# Patient Record
Sex: Female | Born: 1955
Health system: Southern US, Community
[De-identification: ages and names within clinical notes are randomized; demographics above are authoritative.]

## PROBLEM LIST (undated history)

## (undated) DIAGNOSIS — B179 Acute viral hepatitis, unspecified: Secondary | ICD-10-CM

## (undated) DIAGNOSIS — F1490 Cocaine use, unspecified, uncomplicated: Secondary | ICD-10-CM

## (undated) DIAGNOSIS — F1010 Alcohol abuse, uncomplicated: Secondary | ICD-10-CM

## (undated) DIAGNOSIS — K226 Gastro-esophageal laceration-hemorrhage syndrome: Secondary | ICD-10-CM

## (undated) DIAGNOSIS — K7460 Unspecified cirrhosis of liver: Secondary | ICD-10-CM

## (undated) DIAGNOSIS — R569 Unspecified convulsions: Secondary | ICD-10-CM

## (undated) DIAGNOSIS — K2980 Duodenitis without bleeding: Secondary | ICD-10-CM

## (undated) DIAGNOSIS — I10 Essential (primary) hypertension: Secondary | ICD-10-CM

## (undated) DIAGNOSIS — E7800 Pure hypercholesterolemia, unspecified: Secondary | ICD-10-CM

## (undated) HISTORY — DX: Unspecified cirrhosis of liver: K74.60

## (undated) HISTORY — DX: Duodenitis without bleeding: K29.80

## (undated) HISTORY — DX: Gastro-esophageal laceration-hemorrhage syndrome: K22.6

## (undated) HISTORY — DX: Acute viral hepatitis, unspecified: B17.9

---

## 2000-05-29 ENCOUNTER — Emergency Department (HOSPITAL_COMMUNITY): Admission: EM | Admit: 2000-05-29 | Discharge: 2000-05-29 | Payer: Self-pay | Admitting: Emergency Medicine

## 2000-05-29 ENCOUNTER — Encounter: Payer: Self-pay | Admitting: Emergency Medicine

## 2003-01-29 ENCOUNTER — Encounter: Payer: Self-pay | Admitting: Family Medicine

## 2003-01-29 ENCOUNTER — Encounter: Admission: RE | Admit: 2003-01-29 | Discharge: 2003-01-29 | Payer: Self-pay | Admitting: Family Medicine

## 2004-04-06 ENCOUNTER — Ambulatory Visit: Payer: Self-pay | Admitting: Internal Medicine

## 2004-04-06 ENCOUNTER — Inpatient Hospital Stay (HOSPITAL_COMMUNITY): Admission: EM | Admit: 2004-04-06 | Discharge: 2004-04-08 | Payer: Self-pay | Admitting: Emergency Medicine

## 2004-08-14 ENCOUNTER — Emergency Department (HOSPITAL_COMMUNITY): Admission: EM | Admit: 2004-08-14 | Discharge: 2004-08-14 | Payer: Self-pay | Admitting: Family Medicine

## 2005-07-11 ENCOUNTER — Encounter: Admission: RE | Admit: 2005-07-11 | Discharge: 2005-07-11 | Payer: Self-pay | Admitting: Family Medicine

## 2010-01-11 ENCOUNTER — Emergency Department (HOSPITAL_COMMUNITY): Admission: EM | Admit: 2010-01-11 | Discharge: 2010-01-11 | Payer: Self-pay | Admitting: Emergency Medicine

## 2010-10-06 NOTE — Op Note (Signed)
NAME:  Christie Morrison, Christie Morrison              ACCOUNT NO.:  [id number]   MEDICAL RECORD NO.:  [id number]          PATIENT TYPE:  INP   LOCATION:  2023                         FACILITY:  [facility]   PHYSICIAN:  Lina Sar, M.D. [facility]  DATE OF BIRTH:  [birth year]   DATE OF PROCEDURE:  DATE OF DISCHARGE:                                 OPERATIVE REPORT   NAME OF PROCEDURE:  Upper endoscopy.   INDICATIONS:  This 55 year old African-American female was admitted with  intractable nausea, vomiting, and hematemesis.  The hemoglobin dropped from  11 g to 9.2 g.  She has a history of alcohol abuse as well as drug abuse.  Her coagulation studies were normal.  After stabilization, she is undergoing  upper endoscopy to assess for the possibility of a Mallory-Weiss tear,  erosive gastritis, or peptic ulcer disease.   ENDOSCOPE:  Fujinon single-channel videoendoscope.   SEDATION:  Versed 5 mg IV, fentanyl 50 mcg IV.   FINDINGS:  Fujinon single-channel videoscope passed under observation  through the posterior pharynx into the esophagus.  The patient was monitored  by pulse oximeter.  Oxygen saturation was normal.  She was cooperative.  Proximal and mid esophageal mucosa was unremarkable with some mild,  nonspecific erythema.  At the G junction on the gastric side was a healing  Mallory-Weiss tear which was a linear ulceration covered with exudate and a  visible vessel which was clotted and did not bleed activity.  There was just  an old organized blood clot covering the Mallory-Weiss tear.  There was no  stricture or hiatal hernia.   Stomach:  Stomach was insufflated with air and appeared to be free of any  blood. Gastric folds were normal.  Gastric antrum was also normal.  Biopsies  were taken for CLOtest.   Duodenum:  Duodenal bulb showed patches of erythema extending into the  descending duodenum.  These were consistent with mild duodenitis.  There  were no erosions and no evidence of bleeding.  There were  no varices.  The  endoscope was then brought back through the stomach, retroflexed, and the  fundus and cardia were visualized but no gastric varices were noted.   IMPRESSION:  1.  Mallory-Weiss tear, healing.  2.  No evidence of esophageal or gastric varices.  3.  Mild duodenitis status post CLOtest.   PLAN:  Will advance the patient's diet to full liquids, recheck her H&H,  switch her from IV Protonix to p.o. Protonix, and she will be able to be  discharged within the next 24 hours.  I also talked to the patient about  abstaining from alcohol and drugs.      Dora   DB/MEDQ  D:  04/08/2004  T:  04/08/2004  Job:  [id number]

## 2010-10-06 NOTE — H&P (Signed)
NAME:  Christie Morrison, Christie Morrison              ACCOUNT NO.:  [id number]   MEDICAL RECORD NO.:  [id number]          PATIENT TYPE:  INP   LOCATION:  1824                         FACILITY:  [facility]   PHYSICIAN:  Michaelyn Barter, M.D. DATE OF BIRTH:  24-Jul-1955   DATE OF ADMISSION:  04/06/2004  DATE OF DISCHARGE:                                HISTORY & PHYSICAL   CHIEF COMPLAINT:  Vomiting blood.   HISTORY OF PRESENT ILLNESS:  The patient is a 55 year old female with a  history of hyperlipidemia who states that Tuesday of this week during the  morning while walking home from [facility] she began to vomit up blood.  She said that on Wednesday she drank a beer around 12 o'clock noon and had  an episode of emesis following the consumption of this beer.  Shortly  thereafter she had an episode of emesis which consisted only of blood.  She  states that over the course of Tuesday and Wednesday she may have vomited up  approximately one cup of blood.  Today she drank water and had an episode of  emesis, however, no blood was present today.  She went to see her primary  care physician, Dr. Blunt around 2 p.m.  At that time, she informed him of  her hematemesis and also complained of darkened stools since Tuesday.  He  sent her to the emergency room for further evaluation.  She denies having  any current abdominal pain, no chest pain, no shortness of breath.  She does  not take aspirin, Motrin, or other NSAID's.  She does not drink caffeine  either.  The patient has never had a GI bleed before.  She does consume  alcohol everyday.   PAST MEDICAL HISTORY:  Hypercholesterolemia.   PAST SURGICAL HISTORY:  C-section.   ALLERGIES:  No known drug allergies.   MEDICATIONS:  Cholesterol medication, however, the patient cannot remember  the name of the medication.   FAMILY HISTORY:  Mother:  Diabetes, gout, hypertension, arthritis.  Father:  Borderline diabetes.   SOCIAL HISTORY:  Cigarettes:  Never.   Alcohol:  The patient drinks one 40  ounce every day and has been doing so for at least 25 years.  She also  consumes approximately a fifth of Vodka on the weekends.  Street drugs:  Positive crack-cocaine usage.  The last use was one year ago.  She has used  crack-cocaine for 10 years prior to one year ago.  Marijuana was positive  also.  The last used was in the 1980s.   REVIEW OF SYSTEMS:  As per HPI, otherwise all other systems are negative.   PHYSICAL EXAMINATION:  GENERAL:  No obvious distress.  VITALS:  Blood pressure 131/94, heart rate 97, respirations 18, temperature  99.  HEENT:  Anicteric.  Extraocular movements are intact.  NECK:  Supple.  No lymphadenopathy.  Thyroid not palpable.  CARDIAC:  S1, S2 present.  Regular rate and rhythm.  No S3.  No S4.  RESPIRATORY:  Clear bilaterally.  No crackles, no wheezes.  ABDOMEN:  Soft.  There was some slight discomfort in the  lower midabdominal  region.  There was no rebound.  There was no guarding.  Bowel sounds are  present.  There is no palpable organomegaly.  EXTREMITIES:  No leg edema.  MUSCULOSKELETAL:  5/5 upper and lower extremity strength.  NEUROLOGICAL:  The patient is alert and oriented x3.   LABORATORY DATA:  Hemoglobin 11.3, hematocrit 32.5.  BUN 12, creatinine 1.0.  Amylase 95, lipase 29.  Fecal occult blood is positive.  SGOT 115, SGPT 67.   ASSESSMENT AND PLAN:  1.  Upper gastrointestinal bleed, etiology is questionable.  However, given      the patient's long history of long-term alcohol consumption one has to      be concerned about the possibility of a bleeding esophageal varices      versus peptic ulcer disease with or without perforation, however,      perforations does not appear to be as likely given the absence of      significant pain and also the fact that the patient although she had an      episode of emesis today denies the presence of blood present.  Will,      however, order abdominal x-rays, flat and  upright.  Will monitor the      patient's hemoglobin and hematocrit very closely, provide aggressive IV      fluid, Protonix 40 mg IV daily, and will request a GI consultation for      further evaluation.  2.  Elevated liver enzymes.  The etiology again is questionable.  However,      this could be related to the effects of alcohol on the liver.  Will get      an ultrasound of the liver for further evaluation.  3.  History of alcohol abuse.  Will provide thiamine, folate, and      multivitamin supplementation.  Will also watch closely for any signs of      withdrawal.  Will check a serum alcohol level and a urine drug screen.      Orla   OR/MEDQ  D:  04/07/2004  T:  04/07/2004  Job:  [id number]

## 2010-10-06 NOTE — Discharge Summary (Signed)
NAME:  Christie Morrison, Christie Morrison              ACCOUNT NO.:  [id number]   MEDICAL RECORD NO.:  [id number]          PATIENT TYPE:  INP   LOCATION:  2023                         FACILITY:  [facility]   PHYSICIAN:  Isidor Holts, M.D.  DATE OF BIRTH:  07-06-1955   DATE OF ADMISSION:  04/06/2004  DATE OF DISCHARGE:  04/08/2004                                 DISCHARGE SUMMARY   PRIMARY CARE PHYSICIAN:  Doneen Poisson, M.D.   PRIMARY DIAGNOSES:  1.  Upper gastrointestinal bleed secondary to Mallory-Weiss tear.  2.  Duodenitis.   SECONDARY DIAGNOSES:  1.  Alcohol excess/abuse.  2.  Dyslipidemia.  3.  History of diverticulosis confirmed by colonoscopy April 04, 2004.   DISCHARGE MEDICATIONS:  Protonix 40 mg p.o. b.i.d. for one week only.   PROCEDURE:  1.  Abdominal x-ray dated April 07, 2004:  This showed nonspecific bowel      gas pattern without bowel obstruction or perforation.  2.  Abdominal ultrasound dated April 07, 2004:  This showed numerous      small gallstones in the gallbladder no sonographic evidence for acute      cholecystitis.  Normal common bile duct.  Limited visualization of the      pancreas and IVC.   HISTORY OF PRESENT ILLNESS:  As in notes of H&P dated April 06, 2004.  However, in brief, this is a 55 year old female who has a history of  dyslipidemia.  She presented with hematemesis as well as black stools  suspicious for melena.  She had no history of NSAIDs use and drinks alcohol,  about 40 ounces of beer a day for the past 25 years.  She also consumes  approximately 3/5th of vodka on the weekends.   She was hemodynamically stable at the time of presentation.  Hemoglobin was  11.3.  She was admitted for further evaluation, investigation, and  treatment.   HOSPITAL COURSE:  Problem 1:  UPPER GASTROINTESTINAL BLEED:  The patient was  admitted, kept NPO initially.  This was subsequently broadened to clear  fluids.  She was treated with intravenous fluids and  proton pump inhibitor  and had no further episodes of hematemesis, and/or melena.  Hemoglobin  remained stable.   She underwent upper GI endoscopy on April 08, 2004.  This showed a  healing Mallory-Weiss tear.  No evidence of peptic ulcer disease.  It was  positive for duodenitis.  CLO-test was done and results are still pending.   Problem 2:  ABNORMAL LIVER FUNCTION TESTS:  The patient was admitted with  SGOT of 115, SGPT of 67, i.e. consistent with alcohol abuse.  Amylase and  lipase levels are within normal limits.  Abdominal ultrasound scan showed  asymptomatic cholelithiasis but was otherwise normal.  She showed no  evidence of alcohol withdrawal phenomena during the course of her hospital  stay and as of April 08, 2004, her SGOT was down to 87, SGPT was 47.  Viral hepatitis screen was negative for hepatitis B surface antigen and  hepatitis C antibody.  She has been strongly cautioned to refrain from  further consumption of alcohol.  She had verbalized understanding.   Problem 3:  HISTORY OF ALCOHOL ABUSE:  See problem 2 above.   Problem 4:  HISTORY OF DYSLIPIDEMIA:  The patient is to follow up with her  primary care physician.   DISPOSITION:  The patient was deemed sufficiently stable to be discharged in  satisfactory condition on April 08, 2004 on a one-week course of  Protonix.  She has been recommended a low cholesterol diet and strongly  advised to avoid alcohol.  She will call her primary care physician to  arrange follow-up appointment within two weeks.  The patient's primary care  physician is Dr. Britt Bottom Blunt.  The patient verbalized understanding.      Chri   CO/MEDQ  D:  04/09/2004  T:  04/09/2004  Job:  [id number]   cc:   Doneen Poisson, M.D.

## 2016-06-08 ENCOUNTER — Emergency Department (HOSPITAL_COMMUNITY): Payer: Self-pay

## 2016-06-08 ENCOUNTER — Inpatient Hospital Stay (HOSPITAL_COMMUNITY): Payer: Self-pay

## 2016-06-08 ENCOUNTER — Encounter (HOSPITAL_COMMUNITY): Payer: Self-pay | Admitting: Emergency Medicine

## 2016-06-08 ENCOUNTER — Inpatient Hospital Stay (HOSPITAL_COMMUNITY)
Admission: EM | Admit: 2016-06-08 | Discharge: 2016-06-13 | DRG: 872 | Disposition: A | Discharge status: Home / Self Care | Payer: Self-pay | Attending: Internal Medicine | Admitting: Internal Medicine

## 2016-06-08 DIAGNOSIS — F10229 Alcohol dependence with intoxication, unspecified: Secondary | ICD-10-CM | POA: Diagnosis present

## 2016-06-08 DIAGNOSIS — R680 Hypothermia, not associated with low environmental temperature: Secondary | ICD-10-CM | POA: Diagnosis present

## 2016-06-08 DIAGNOSIS — E876 Hypokalemia: Secondary | ICD-10-CM | POA: Diagnosis present

## 2016-06-08 DIAGNOSIS — N179 Acute kidney failure, unspecified: Secondary | ICD-10-CM | POA: Diagnosis present

## 2016-06-08 DIAGNOSIS — B179 Acute viral hepatitis, unspecified: Secondary | ICD-10-CM

## 2016-06-08 DIAGNOSIS — K921 Melena: Secondary | ICD-10-CM | POA: Diagnosis present

## 2016-06-08 DIAGNOSIS — Z841 Family history of disorders of kidney and ureter: Secondary | ICD-10-CM

## 2016-06-08 DIAGNOSIS — Y908 Blood alcohol level of 240 mg/100 ml or more: Secondary | ICD-10-CM | POA: Diagnosis present

## 2016-06-08 DIAGNOSIS — E870 Hyperosmolality and hypernatremia: Secondary | ICD-10-CM | POA: Diagnosis present

## 2016-06-08 DIAGNOSIS — D6959 Other secondary thrombocytopenia: Secondary | ICD-10-CM | POA: Diagnosis present

## 2016-06-08 DIAGNOSIS — Z833 Family history of diabetes mellitus: Secondary | ICD-10-CM

## 2016-06-08 DIAGNOSIS — F1010 Alcohol abuse, uncomplicated: Secondary | ICD-10-CM | POA: Diagnosis present

## 2016-06-08 DIAGNOSIS — K7030 Alcoholic cirrhosis of liver without ascites: Secondary | ICD-10-CM | POA: Diagnosis present

## 2016-06-08 DIAGNOSIS — E860 Dehydration: Secondary | ICD-10-CM | POA: Diagnosis present

## 2016-06-08 DIAGNOSIS — Z811 Family history of alcohol abuse and dependence: Secondary | ICD-10-CM

## 2016-06-08 DIAGNOSIS — D696 Thrombocytopenia, unspecified: Secondary | ICD-10-CM | POA: Diagnosis present

## 2016-06-08 DIAGNOSIS — K2920 Alcoholic gastritis without bleeding: Secondary | ICD-10-CM | POA: Diagnosis present

## 2016-06-08 DIAGNOSIS — A419 Sepsis, unspecified organism: Principal | ICD-10-CM | POA: Diagnosis present

## 2016-06-08 DIAGNOSIS — R740 Nonspecific elevation of levels of transaminase and lactic acid dehydrogenase [LDH]: Secondary | ICD-10-CM

## 2016-06-08 DIAGNOSIS — F1410 Cocaine abuse, uncomplicated: Secondary | ICD-10-CM | POA: Diagnosis present

## 2016-06-08 DIAGNOSIS — I10 Essential (primary) hypertension: Secondary | ICD-10-CM | POA: Diagnosis present

## 2016-06-08 DIAGNOSIS — D638 Anemia in other chronic diseases classified elsewhere: Secondary | ICD-10-CM | POA: Diagnosis present

## 2016-06-08 DIAGNOSIS — I959 Hypotension, unspecified: Secondary | ICD-10-CM | POA: Diagnosis present

## 2016-06-08 DIAGNOSIS — F10929 Alcohol use, unspecified with intoxication, unspecified: Secondary | ICD-10-CM

## 2016-06-08 DIAGNOSIS — R7989 Other specified abnormal findings of blood chemistry: Secondary | ICD-10-CM | POA: Diagnosis present

## 2016-06-08 DIAGNOSIS — E7800 Pure hypercholesterolemia, unspecified: Secondary | ICD-10-CM | POA: Diagnosis present

## 2016-06-08 DIAGNOSIS — K7010 Alcoholic hepatitis without ascites: Secondary | ICD-10-CM | POA: Diagnosis present

## 2016-06-08 HISTORY — DX: Pure hypercholesterolemia, unspecified: E78.00

## 2016-06-08 HISTORY — DX: Cocaine use, unspecified, uncomplicated: F14.90

## 2016-06-08 HISTORY — DX: Alcohol abuse, uncomplicated: F10.10

## 2016-06-08 LAB — CBC WITH DIFFERENTIAL/PLATELET
BASOS PCT: 0 %
Basophils Absolute: 0 10*3/uL (ref 0.0–0.1)
EOS ABS: 0 10*3/uL (ref 0.0–0.7)
Eosinophils Relative: 1 %
HEMATOCRIT: 41.2 % (ref 36.0–46.0)
HEMOGLOBIN: 13.6 g/dL (ref 12.0–15.0)
LYMPHS ABS: 2.4 10*3/uL (ref 0.7–4.0)
Lymphocytes Relative: 46 %
MCH: 32.6 pg (ref 26.0–34.0)
MCHC: 33 g/dL (ref 30.0–36.0)
MCV: 98.8 fL (ref 78.0–100.0)
MONO ABS: 0.1 10*3/uL (ref 0.1–1.0)
MONOS PCT: 2 %
NEUTROS ABS: 2.6 10*3/uL (ref 1.7–7.7)
NEUTROS PCT: 51 %
Platelets: 146 10*3/uL — ABNORMAL LOW (ref 150–400)
RBC: 4.17 MIL/uL (ref 3.87–5.11)
RDW: 14 % (ref 11.5–15.5)
WBC: 5.1 10*3/uL (ref 4.0–10.5)

## 2016-06-08 LAB — COMPREHENSIVE METABOLIC PANEL
ALT: 184 U/L — AB (ref 14–54)
ALT: 262 U/L — ABNORMAL HIGH (ref 14–54)
ANION GAP: 20 — AB (ref 5–15)
AST: 658 U/L — ABNORMAL HIGH (ref 15–41)
AST: 1275 U/L — AB (ref 15–41)
Albumin: 4 g/dL (ref 3.5–5.0)
Albumin: 3.3 g/dL — ABNORMAL LOW (ref 3.5–5.0)
Alkaline Phosphatase: 85 U/L (ref 38–126)
Alkaline Phosphatase: 88 U/L (ref 38–126)
Anion gap: 14 (ref 5–15)
BUN: 5 mg/dL — ABNORMAL LOW (ref 6–20)
BUN: <5 mg/dL — ABNORMAL LOW (ref 6–20)
CHLORIDE: 105 mmol/L (ref 101–111)
CHLORIDE: 114 mmol/L — AB (ref 101–111)
CO2: 21 mmol/L — AB (ref 22–32)
CO2: 17 mmol/L — AB (ref 22–32)
Calcium: 9.2 mg/dL (ref 8.9–10.3)
Calcium: 7.5 mg/dL — ABNORMAL LOW (ref 8.9–10.3)
Creatinine, Ser: 0.96 mg/dL (ref 0.44–1.00)
Creatinine, Ser: 1.18 mg/dL — ABNORMAL HIGH (ref 0.44–1.00)
GFR, EST AFRICAN AMERICAN: 57 mL/min — AB (ref >60)
GFR, EST NON AFRICAN AMERICAN: 49 mL/min — AB (ref >60)
Glucose, Bld: 128 mg/dL — ABNORMAL HIGH (ref 65–99)
Glucose, Bld: 118 mg/dL — ABNORMAL HIGH (ref 65–99)
POTASSIUM: 3.7 mmol/L (ref 3.5–5.1)
POTASSIUM: 3.7 mmol/L (ref 3.5–5.1)
SODIUM: 146 mmol/L — AB (ref 135–145)
SODIUM: 145 mmol/L (ref 135–145)
Total Bilirubin: 0.6 mg/dL (ref 0.3–1.2)
Total Bilirubin: 0.8 mg/dL (ref 0.3–1.2)
Total Protein: 7.3 g/dL (ref 6.5–8.1)
Total Protein: 6.1 g/dL — ABNORMAL LOW (ref 6.5–8.1)

## 2016-06-08 LAB — URINALYSIS, ROUTINE W REFLEX MICROSCOPIC
Bilirubin Urine: NEGATIVE
Glucose, UA: 50 mg/dL — AB
KETONES UR: NEGATIVE mg/dL
LEUKOCYTES UA: NEGATIVE
NITRITE: NEGATIVE
PH: 7 (ref 5.0–8.0)
PROTEIN: 100 mg/dL — AB
Specific Gravity, Urine: 1.008 (ref 1.005–1.030)

## 2016-06-08 LAB — I-STAT CG4 LACTIC ACID, ED
LACTIC ACID, VENOUS: 7.63 mmol/L — AB (ref 0.5–1.9)
Lactic Acid, Venous: 3.21 mmol/L (ref 0.5–1.9)

## 2016-06-08 LAB — POC OCCULT BLOOD, ED: Fecal Occult Bld: POSITIVE — AB

## 2016-06-08 LAB — I-STAT VENOUS BLOOD GAS, ED
ACID-BASE DEFICIT: 7 mmol/L — AB (ref 0.0–2.0)
BICARBONATE: 20.7 mmol/L (ref 20.0–28.0)
O2 Saturation: 39 %
TCO2: 22 mmol/L (ref 0–100)
pCO2, Ven: 47.5 mmHg (ref 44.0–60.0)
pH, Ven: 7.247 — ABNORMAL LOW (ref 7.250–7.430)
pO2, Ven: 26 mmHg — CL (ref 32.0–45.0)

## 2016-06-08 LAB — POC URINE PREG, ED: PREG TEST UR: NEGATIVE

## 2016-06-08 LAB — RAPID URINE DRUG SCREEN, HOSP PERFORMED
Amphetamines: NOT DETECTED
BARBITURATES: NOT DETECTED
BENZODIAZEPINES: NOT DETECTED
COCAINE: POSITIVE — AB
OPIATES: NOT DETECTED
TETRAHYDROCANNABINOL: NOT DETECTED

## 2016-06-08 LAB — LIPASE, BLOOD: LIPASE: 96 U/L — AB (ref 11–51)

## 2016-06-08 LAB — CK: CK TOTAL: 306 U/L — AB (ref 38–234)

## 2016-06-08 LAB — PROTIME-INR
INR: 1.01
INR: 1.06
PROTHROMBIN TIME: 13.3 s (ref 11.4–15.2)
Prothrombin Time: 13.8 seconds (ref 11.4–15.2)

## 2016-06-08 LAB — MAGNESIUM: MAGNESIUM: 1.3 mg/dL — AB (ref 1.7–2.4)

## 2016-06-08 LAB — I-STAT TROPONIN, ED: Troponin i, poc: 0 ng/mL (ref 0.00–0.08)

## 2016-06-08 LAB — C DIFFICILE QUICK SCREEN W PCR REFLEX
C DIFFICILE (CDIFF) INTERP: NOT DETECTED
C Diff antigen: NEGATIVE
C Diff toxin: NEGATIVE

## 2016-06-08 LAB — CBC
HEMATOCRIT: 38.2 % (ref 36.0–46.0)
HEMOGLOBIN: 12.5 g/dL (ref 12.0–15.0)
MCH: 32.3 pg (ref 26.0–34.0)
MCHC: 32.7 g/dL (ref 30.0–36.0)
MCV: 98.7 fL (ref 78.0–100.0)
Platelets: 127 10*3/uL — ABNORMAL LOW (ref 150–400)
RBC: 3.87 MIL/uL (ref 3.87–5.11)
RDW: 14 % (ref 11.5–15.5)
WBC: 11.3 10*3/uL — ABNORMAL HIGH (ref 4.0–10.5)

## 2016-06-08 LAB — ETHANOL: ALCOHOL ETHYL (B): 387 mg/dL — AB (ref <5)

## 2016-06-08 LAB — TSH: TSH: 1.68 u[IU]/mL (ref 0.350–4.500)

## 2016-06-08 LAB — PHOSPHORUS: PHOSPHORUS: 4.7 mg/dL — AB (ref 2.5–4.6)

## 2016-06-08 LAB — PROCALCITONIN: PROCALCITONIN: 1.75 ng/mL

## 2016-06-08 LAB — AMMONIA: AMMONIA: 65 umol/L — AB (ref 9–35)

## 2016-06-08 LAB — LACTIC ACID, PLASMA: LACTIC ACID, VENOUS: 2.4 mmol/L — AB (ref 0.5–1.9)

## 2016-06-08 LAB — ACETAMINOPHEN LEVEL: Acetaminophen (Tylenol), Serum: <10 ug/mL — ABNORMAL LOW (ref 10–30)

## 2016-06-08 LAB — MRSA PCR SCREENING: MRSA BY PCR: NEGATIVE

## 2016-06-08 MED ORDER — SODIUM CHLORIDE 0.9 % IV BOLUS (SEPSIS)
1000.0000 mL | Freq: Once | INTRAVENOUS | Status: AC
  Administered 2016-06-08: 1000 mL via INTRAVENOUS

## 2016-06-08 MED ORDER — ONDANSETRON HCL 4 MG/2ML IJ SOLN: 4.0000 mg | Freq: Four times a day (QID) | INTRAMUSCULAR | Status: DC | PRN

## 2016-06-08 MED ORDER — PANTOPRAZOLE SODIUM 40 MG IV SOLR
40.0000 mg | Freq: Every day | INTRAVENOUS | Status: DC
  Administered 2016-06-09 – 2016-06-12 (×4): 40 mg via INTRAVENOUS
  Filled 2016-06-08 (×5): qty 40

## 2016-06-08 MED ORDER — ONDANSETRON HCL 4 MG/2ML IJ SOLN
4.0000 mg | Freq: Once | INTRAMUSCULAR | Status: AC
  Administered 2016-06-08: 4 mg via INTRAVENOUS
  Filled 2016-06-08: qty 2

## 2016-06-08 MED ORDER — MAGNESIUM SULFATE 2 GM/50ML IV SOLN
2.0000 g | Freq: Once | INTRAVENOUS | Status: AC
Start: 2016-06-08 — End: 2016-06-08
  Administered 2016-06-08: 2 g via INTRAVENOUS
  Filled 2016-06-08: qty 50

## 2016-06-08 MED ORDER — ACETAMINOPHEN 325 MG PO TABS: 650.0000 mg | ORAL_TABLET | Freq: Four times a day (QID) | ORAL | Status: DC | PRN

## 2016-06-08 MED ORDER — SODIUM CHLORIDE 0.45 % IV SOLN
INTRAVENOUS | Status: DC
  Administered 2016-06-08 – 2016-06-12 (×11): via INTRAVENOUS

## 2016-06-08 MED ORDER — PIPERACILLIN-TAZOBACTAM 3.375 G IVPB 30 MIN
3.3750 g | Freq: Once | INTRAVENOUS | Status: AC
  Administered 2016-06-08: 3.375 g via INTRAVENOUS
  Filled 2016-06-08: qty 50

## 2016-06-08 MED ORDER — ACETAMINOPHEN 650 MG RE SUPP: 650.0000 mg | Freq: Four times a day (QID) | RECTAL | Status: DC | PRN

## 2016-06-08 MED ORDER — PANTOPRAZOLE SODIUM 40 MG IV SOLR: 40.0000 mg | Freq: Two times a day (BID) | INTRAVENOUS | Status: DC

## 2016-06-08 MED ORDER — LORAZEPAM 2 MG/ML IJ SOLN
2.0000 mg | INTRAMUSCULAR | Status: DC | PRN
  Administered 2016-06-09 – 2016-06-11 (×2): 2 mg via INTRAVENOUS
  Filled 2016-06-08 (×3): qty 1

## 2016-06-08 MED ORDER — PANTOPRAZOLE SODIUM 40 MG IV SOLR
40.0000 mg | Freq: Once | INTRAVENOUS | Status: AC
  Administered 2016-06-08: 40 mg via INTRAVENOUS
  Filled 2016-06-08: qty 40

## 2016-06-08 MED ORDER — VANCOMYCIN HCL IN DEXTROSE 1-5 GM/200ML-% IV SOLN
1000.0000 mg | Freq: Once | INTRAVENOUS | Status: AC
  Administered 2016-06-08: 1000 mg via INTRAVENOUS
  Filled 2016-06-08: qty 200

## 2016-06-08 MED ORDER — VANCOMYCIN HCL IN DEXTROSE 750-5 MG/150ML-% IV SOLN
750.0000 mg | Freq: Two times a day (BID) | INTRAVENOUS | Status: DC
  Administered 2016-06-09: 750 mg via INTRAVENOUS
  Filled 2016-06-08 (×2): qty 150

## 2016-06-08 MED ORDER — ONDANSETRON HCL 4 MG PO TABS: 4.0000 mg | ORAL_TABLET | Freq: Four times a day (QID) | ORAL | Status: DC | PRN

## 2016-06-08 MED ORDER — PIPERACILLIN-TAZOBACTAM 3.375 G IVPB
3.3750 g | Freq: Three times a day (TID) | INTRAVENOUS | Status: DC
  Administered 2016-06-08 – 2016-06-11 (×9): 3.375 g via INTRAVENOUS
  Filled 2016-06-08 (×11): qty 50

## 2016-06-08 MED ORDER — SODIUM CHLORIDE 0.9% FLUSH
3.0000 mL | Freq: Two times a day (BID) | INTRAVENOUS | Status: DC
  Administered 2016-06-08 – 2016-06-13 (×7): 3 mL via INTRAVENOUS

## 2016-06-08 NOTE — H&P (Signed)
History and Physical    Christie Morrison [id number] DOB: 1955-06-11 DOA: 06/08/2016   PCP: No primary care provider on file.  UNASSIGNED  Patient coming from/Resides with: Private residence/lives with son  Admission status: Inpatient/medically necessary to stay a minimum 2 midnights to rule out impending and/or unexpected changes in physiologic status that may differ from initial evaluation performed in the ER and/or at time of admission. Patient presents with profound dehydration as evidenced by elevated lactic acid, elevated LFTs concerning for either shock liver versus alcohol induced, alcohol level greater than 300 and hypernatremia. After arrival patient developed melena concerning for possible active GI bleeding prompting inpatient gastroenterology consultation. She will require IV fluids, close monitoring of respiratory as well as hemodynamic status in setting of possible impending alcohol withdrawal in setting of very heavy alcohol use. Stepdown CIWA protocol has been initiated.  Chief Complaint: Alcohol intoxication with severe dehydration  HPI: Christie Morrison is a 61 y.o. female with medical history significant for alcohol and cocaine abuse. Patient previously hospitalized in 2012 with intractable nausea and vomiting with associated to Mallory-Weiss tear and mild duodenitis found on EGD. Patient's son called EMS to the home today after finding his mother lying on the floor between the toilet and the bathtub. She had been drinking heavily and smoking crack all night. EMS arrived to the home and the patient was noted with agonal respiratory effort. Bag valve mask utilized which point patient became more alert and was responsive to verbal stimuli when he 4 but cool to the touch. After arrival to the ER she was given multiple fluid challenges. Her initial lactate was slightly greater than 7 with a decreased to just above 3 after hydration. Alcohol level was greater than 300. Chest x-ray and  urinalysis were negative for source of infection. She was not febrile and she had no leukocytosis. Drug screen was positive for cocaine. She began having loose melanotic stools without any associated abdominal pain.  ED Course:  Vital Signs: BP 123/77   Pulse 108   Temp (S) (!) 96.9 F (36.1 C) (Rectal)   Resp 18   Wt 56.7 kg (125 lb)   SpO2 95%  2 view chest x-ray: No active cardiopulmonary process CT head without contrast: NEG CT cervical spine without contrast: NEG Lab data: CK 306, lactic acid 7.63 with repeat 3.21, white count 5100 with normal differential, hemoglobin 13.6, platelets 146,000, coags normal, urine pregnancy negative, poc troponin 0.00, urinalysis abnormal with hazy appearance, rare bacteria, 50 of glucose, moderate hemoglobin, 100 protein, C. difficile negative, fecal occult blood positive, urine drug screen positive for cocaine, blood cultures and urine cultures obtained in the ER due to concerns for possible sepsis etiology Medications and treatments: Normal saline bolus 3 L, Zofran 4 mg IV 1, Zosyn 3.375 g IV 1, vancomycin 1 g IV 1, Protonix 40 mg IV 1  Review of Systems:  In addition to the HPI above,  No Fever-chills, myalgias or other constitutional symptoms No Headache, changes with Vision or hearing, new weakness, tingling, numbness in any extremity, dizziness, dysarthria or word finding difficulty, gait disturbance or imbalance, tremors or seizure activity No problems swallowing food or Liquids, indigestion/reflux, choking or coughing while eating, abdominal pain with or after eating No Chest pain, Cough or Shortness of Breath, palpitations, orthopnea or DOE No Abdominal pain, N/V, melena,hematochezia, dark tarry stools, constipation or to arrival No dysuria, malodorous urine, hematuria or flank pain No new skin rashes, lesions, masses or bruises, No new joint  pains, aches, swelling or redness No recent unintentional weight gain or loss No polyuria,  polydypsia or polyphagia   Past Medical History:  Diagnosis Date  . Crack cocaine use   . ETOH abuse   . High cholesterol     Past Surgical History:  Procedure Laterality Date  . CESAREAN SECTION      Social History   Social History  . Marital status: Married    Spouse name: N/A  . Number of children: N/A  . Years of education: N/A   Occupational History  . Not on file.   Social History Main Topics  . Smoking status: Never Smoker  . Smokeless tobacco: Never Used  . Alcohol use Yes     Comment: drinks 3-4 40ounce beers plus a fifth of liquor daily   . Drug use: Yes    Types: Cocaine     Comment: crack -- not every day-   . Sexual activity: No   Other Topics Concern  . Not on file   Social History Narrative  . No narrative on file    Mobility: No assistive devices Work history: Works at a [employer], walks to work   No Known Allergies  Family history reviewed: Mother and brother both with history diabetes and dialysis, grandmother history of alcoholism; mother also history of alcoholism but quit  Prior to Admission medications   Not on File    Physical Exam: Vitals:   06/08/16 1331 06/08/16 1345 06/08/16 1415 06/08/16 1445  BP: 128/87 120/75 123/77 139/87  Pulse: 108 108 108 109  Resp: 19 25 18 20   Temp:      TempSrc:      SpO2: 99% 99% 95% 98%  Weight:          Constitutional: NAD, calm, comfortable Eyes: PERRL, lids and conjunctivae normal ENMT: Mucous membranes are Dry. Posterior pharynx clear of any exudate or lesions. Poor dentition.  Neck: normal, supple, no masses, no thyromegaly Respiratory: clear to auscultation bilaterally, no wheezing, no crackles. Normal respiratory effort. No accessory muscle use.  Cardiovascular: Regular rate and rhythm, no murmurs / rubs / gallops. No extremity edema. 2+ pedal pulses. No carotid bruits.  Abdomen: no tenderness, no masses palpated. No hepatosplenomegaly. Bowel sounds positive.    Musculoskeletal: no clubbing / cyanosis. No joint deformity upper and lower extremities. Good ROM, no contractures. Normal muscle tone.  Skin: no rashes, lesions, ulcers. No induration Neurologic: CN 2-12 grossly intact. Sensation intact, DTR normal. Strength 5/5 x all 4 extremities.  Psychiatric: Normal judgment and insight. Awakens easily and is oriented x 3. Normal mood.    Labs on Admission: I have personally reviewed following labs and imaging studies  CBC:  Recent Labs Lab 06/08/16 1047  WBC 5.1  NEUTROABS 2.6  HGB 13.6  HCT 41.2  MCV 98.8  PLT 123456*   Basic Metabolic Panel:  Recent Labs Lab 06/08/16 0948  NA 146*  K 3.7  CL 105  CO2 21*  GLUCOSE 128*  BUN 5*  CREATININE 0.96  CALCIUM 9.2   GFR: CrCl cannot be calculated (Unknown ideal weight.). Liver Function Tests:  Recent Labs Lab 06/08/16 0948  AST 658*  ALT 184*  ALKPHOS 85  BILITOT 0.6  PROT 7.3  ALBUMIN 4.0    Recent Labs Lab 06/08/16 0948  LIPASE 96*    Recent Labs Lab 06/08/16 0948  AMMONIA 65*   Coagulation Profile:  Recent Labs Lab 06/08/16 1046  INR 1.01   Cardiac Enzymes:  Recent Labs Lab 06/08/16 1046  CKTOTAL 306*   BNP (last 3 results) No results for input(s): PROBNP in the last 8760 hours. HbA1C: No results for input(s): HGBA1C in the last 72 hours. CBG: No results for input(s): GLUCAP in the last 168 hours. Lipid Profile: No results for input(s): CHOL, HDL, LDLCALC, TRIG, CHOLHDL, LDLDIRECT in the last 72 hours. Thyroid Function Tests: No results for input(s): TSH, T4TOTAL, FREET4, T3FREE, THYROIDAB in the last 72 hours. Anemia Panel: No results for input(s): VITAMINB12, FOLATE, FERRITIN, TIBC, IRON, RETICCTPCT in the last 72 hours. Urine analysis:    Component Value Date/Time   COLORURINE YELLOW 06/08/2016 1335   APPEARANCEUR HAZY (A) 06/08/2016 1335   LABSPEC 1.008 06/08/2016 1335   PHURINE 7.0 06/08/2016 1335   GLUCOSEU 50 (A) 06/08/2016 1335    HGBUR MODERATE (A) 06/08/2016 1335   BILIRUBINUR NEGATIVE 06/08/2016 1335   KETONESUR NEGATIVE 06/08/2016 1335   PROTEINUR 100 (A) 06/08/2016 1335   NITRITE NEGATIVE 06/08/2016 1335   LEUKOCYTESUR NEGATIVE 06/08/2016 1335   Sepsis Labs: @LABRCNTIP (procalcitonin:4,lacticidven:4) ) Recent Results (from the past 240 hour(s))  C difficile quick scan w PCR reflex     Status: None   Collection Time: 06/08/16 12:29 PM  Result Value Ref Range Status   C Diff antigen NEGATIVE NEGATIVE Final   C Diff toxin NEGATIVE NEGATIVE Final   C Diff interpretation No C. difficile detected.  Final     Radiological Exams on Admission: Dg Chest 2 View  Result Date: 06/08/2016 CLINICAL DATA:  Found down. Unresponsive. History of substance abuse. EXAM: CHEST  2 VIEW COMPARISON:  07/11/2005 radiographs. FINDINGS: Suboptimal inspiration on the frontal examination. The heart size and mediastinal contours are normal. The lungs are clear. There is no pleural effusion or pneumothorax. No acute osseous findings are identified. IMPRESSION: No active cardiopulmonary process. Electronically Signed   By: Richardean Sale M.D.   On: 06/08/2016 10:31   Ct Head Wo Contrast  Result Date: 06/08/2016 CLINICAL DATA:  Altered mental status. Found down. History of substance abuse. EXAM: CT HEAD WITHOUT CONTRAST CT CERVICAL SPINE WITHOUT CONTRAST TECHNIQUE: Multidetector CT imaging of the head and cervical spine was performed following the standard protocol without intravenous contrast. Multiplanar CT image reconstructions of the cervical spine were also generated. COMPARISON:  None. FINDINGS: CT HEAD FINDINGS Brain: There is no evidence of acute intracranial hemorrhage, mass lesion, brain edema or extra-axial fluid collection. There is mild generalized atrophy with prominence of the ventricles and subarachnoid spaces. There is no hydrocephalus. Mild periventricular white matter low-density likely reflects small vessel ischemic change.  There is no CT evidence of acute cortical infarction. Vascular: Intracranial vascular calcifications are present. Skull: Negative for fracture or focal lesion. Sinuses/Orbits: There is an old fracture of the lamina papyracea on the left with adjacent partial left ethmoid sinus opacification. The additional visualized paranasal sinuses, mastoid air cells and middle ears are clear. Other: None. CT CERVICAL SPINE FINDINGS Alignment: Straightening without focal angulation or listhesis. Skull base and vertebrae: No evidence of acute cervical spine fracture or traumatic subluxation. Soft tissues and spinal canal: No prevertebral fluid or swelling. No visible canal hematoma. There is asymmetric carotid atherosclerosis of the left. Disc levels: Disc height is relatively maintained throughout the cervical spine. There is probable bilateral facet ankylosis at C2-3 and on the left at C3-4. There is some ossification of the corresponding discs. There is prominent asymmetric right-sided facet hypertrophy at C5-6 and C6-7. No high-grade foraminal narrowing identified. Upper chest: The  visualized upper chest appears unremarkable. Other: None. IMPRESSION: 1. No acute intracranial or calvarial findings. 2. No evidence of acute cervical spine fracture, traumatic subluxation or static signs of instability. 3. 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 Electronically Signed   By: Richardean Sale M.D.   On: 06/08/2016 12:04   Ct Cervical Spine Wo Contrast  Result Date: 06/08/2016 CLINICAL DATA:  Altered mental status. Found down. History of substance abuse. EXAM: CT HEAD WITHOUT CONTRAST CT CERVICAL SPINE WITHOUT CONTRAST TECHNIQUE: Multidetector CT imaging of the head and cervical spine was performed following the standard protocol without intravenous contrast. Multiplanar CT image reconstructions of the cervical spine were also generated. COMPARISON:  None. FINDINGS: CT HEAD FINDINGS  Brain: There is no evidence of acute intracranial hemorrhage, mass lesion, brain edema or extra-axial fluid collection. There is mild generalized atrophy with prominence of the ventricles and subarachnoid spaces. There is no hydrocephalus. Mild periventricular white matter low-density likely reflects small vessel ischemic change. There is no CT evidence of acute cortical infarction. Vascular: Intracranial vascular calcifications are present. Skull: Negative for fracture or focal lesion. Sinuses/Orbits: There is an old fracture of the lamina papyracea on the left with adjacent partial left ethmoid sinus opacification. The additional visualized paranasal sinuses, mastoid air cells and middle ears are clear. Other: None. CT CERVICAL SPINE FINDINGS Alignment: Straightening without focal angulation or listhesis. Skull base and vertebrae: No evidence of acute cervical spine fracture or traumatic subluxation. Soft tissues and spinal canal: No prevertebral fluid or swelling. No visible canal hematoma. There is asymmetric carotid atherosclerosis of the left. Disc levels: Disc height is relatively maintained throughout the cervical spine. There is probable bilateral facet ankylosis at C2-3 and on the left at C3-4. There is some ossification of the corresponding discs. There is prominent asymmetric right-sided facet hypertrophy at C5-6 and C6-7. No high-grade foraminal narrowing identified. Upper chest: The visualized upper chest appears unremarkable. Other: None. IMPRESSION: 1. No acute intracranial or calvarial findings. 2. No evidence of acute cervical spine fracture, traumatic subluxation or static signs of instability. 3. 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 Electronically Signed   By: Richardean Sale M.D.   On: 06/08/2016 12:04    EKG: (Independently reviewed) sinus tachycardia with ventricular rate 101 minute, QTC 544 ms (rate related) with no acute ischemic  changes  Assessment/Plan Principal Problem:   Dehydration, severe -Patient presents with hypernatremia, elevated serum lactate in setting of excessive alcohol and crack cocaine use in the past 24 hours -Continue aggressive volume resuscitation -Current renal function normal -Follow electrolytes with next panel due at 4 PM -CK mildly elevated at 306  Active Problems:   Elevated lactic acid level -Initial lactate greater than 7 with repeat after fluid challenges down to 3.21 -Chest x-ray negative for infection and urinalysis negative -No source for infection -suspect elevated lactate secondary to volume depletion and low perfusion from hypotension noting low SBP in the 80s at presentation and she was also hypothermic with a rectal temperature of 96.9 -Repeat lactate at 4 PM -No indication at this juncture to continue antibiotics -monitor for signs of infection such as fever, leukocytosis or clinical exam changes -Blood cultures and urine culture obtained in the ER -For completeness of exam check Procalcitonin  **Repeat CBC with mild leukocytosis of 11,300 with worsening of thrombocytopenia so as precaution we will utilize IV Zosyn to treat for possible aspiration pneumonitis    Melena -Patient  developed melena with loose stools after arrival -C. difficile negative -Repeat CBC at 4 PM and again in a.m. -GI consulted; ?? Alcoholic gastritis -NPO until evaluated by GI -Protonix 40 mg IV every 12 hours -H. pylori serologies    Acute hypernatremia -Secondary to dehydration -Utilize half normal saline for rehydration -If sodium remains elevated on follow-up panel may need to switch to D5W or LR    Hypomagnesemia -IV repletion -Lab in a.m.    Alcohol abuse -Alcohol level at presentation 387 -Step down CIWA protocol -Continuous pulse oximetry -Neurological checks every 2 hours -Patient presented with nonobstructive transaminitis with AST 658 and ALT [id number]; uncertain if related to  ongoing alcohol abuse versus secondary to low perfusion from recent hypotension and dehydration -Ammonia mildly elevated at 65-repeat in a.m. -Complete abdominal ultrasound -Acute hepatitis panel -HIV -Alcohol cessation counseling given; the longest patient has gone without drinking is one year and she reports drinking for "years"-discuss with patient that her alcohol use has now increased to a life-threatening situation -Check phosphorus and magnesium, TSH    Cocaine abuse -UDS positive -Patient denies daily use    Thrombocytopenia  -Uncertain if related to low perfusion and acute hypoperfusion related hepatitis or this is reflective of underlying cirrhosis -Workup as outlined above     Prolonged QT interval -Machine calculated QTc of 544 ms patient was also somewhat tachycardic -Repeat EKG in a.m. -Replace magnesium if low -Telemetry monitoring    DVT prophylaxis: SCDs  Code Status: Full Family Communication: Son at bedside Disposition Plan: Anticipate discharge back to preadmission home environment when medically stable Consults called: Gastroenterology/[place] on-call physician/spoke with Chester Holstein, NP     Samella Parr ANP-BC [facility] Pager [phone]   If 7PM-7AM, please contact night-coverage [email or web address] Password [email or web address]  06/08/2016, 2:59 PM

## 2016-06-08 NOTE — ED Notes (Signed)
Son-- at bedside-- Christy Sartorius [phone]

## 2016-06-08 NOTE — ED Notes (Addendum)
Pt has had multiple episodes of diarrhea. MD aware. Cleaned pt and linens. Removed C collar per verbal order from MD

## 2016-06-08 NOTE — ED Provider Notes (Signed)
[facility] DEPT Provider Note   CSN: [id number] Arrival date & time: 06/08/16  0910     History   Chief Complaint Chief Complaint  Patient presents with  . unresponsive  . Altered Mental Status    HPI Christie Morrison is a 61 y.o. female     The history is provided by the patient, the EMS personnel and a relative. No language interpreter was used.  Altered Mental Status   This is a new problem. The current episode started 1 to 2 hours ago. The problem has been gradually improving. Associated symptoms include confusion, somnolence and unresponsiveness. Risk factors include alcohol intake and illicit drug use. Her past medical history does not include seizures or dementia.    No past medical history on file.  There are no active problems to display for this patient.   No past surgical history on file.  OB History    No data available       Home Medications    Prior to Admission medications   Not on File    Family History No family history on file.  Social History Social History  Substance Use Topics  . Smoking status: Not on file  . Smokeless tobacco: Not on file  . Alcohol use Not on file     Allergies   Patient has no allergy information on record.   Review of Systems Review of Systems  Unable to perform ROS: Mental status change  Psychiatric/Behavioral: Positive for confusion.     Physical Exam Updated Vital Signs There were no vitals taken for this visit.  Physical Exam  Constitutional: She appears well-developed and well-nourished. She appears lethargic. No distress.  HENT:  Head: Normocephalic and atraumatic.  Mouth/Throat: Oropharynx is clear and moist. No oropharyngeal exudate.  Eyes: Conjunctivae are normal. Pupils are equal, round, and reactive to light.  Neck: Neck supple.  Cardiovascular: Regular rhythm.  Tachycardia present.   No murmur heard. Pulmonary/Chest: Effort normal and breath sounds normal. No respiratory  distress. She has no wheezes. She exhibits no tenderness.  Abdominal: Soft. There is no tenderness.  Musculoskeletal: She exhibits no edema.  Neurological: She appears lethargic. She displays no tremor and normal reflexes. No cranial nerve deficit or sensory deficit. She exhibits normal muscle tone. Coordination normal. GCS eye subscore is 3. GCS verbal subscore is 5. GCS motor subscore is 6.  Skin: Skin is warm and dry. No rash noted. No erythema.  Psychiatric: She has a normal mood and affect.  Nursing note and vitals reviewed.    ED Treatments / Results  Labs (all labs ordered are listed, but only abnormal results are displayed) Labs Reviewed  RAPID URINE DRUG SCREEN, HOSP PERFORMED - Abnormal; Notable for the following:       Result Value   Cocaine POSITIVE (*)    All other components within normal limits  URINALYSIS, ROUTINE W REFLEX MICROSCOPIC - Abnormal; Notable for the following:    APPearance HAZY (*)    Glucose, UA 50 (*)    Hgb urine dipstick MODERATE (*)    Protein, ur 100 (*)    Bacteria, UA RARE (*)    Squamous Epithelial / LPF 0-5 (*)    All other components within normal limits  ETHANOL - Abnormal; Notable for the following:    Alcohol, Ethyl (B) 387 (*)    All other components within normal limits  AMMONIA - Abnormal; Notable for the following:    Ammonia 65 (*)    All other  components within normal limits  COMPREHENSIVE METABOLIC PANEL - Abnormal; Notable for the following:    Sodium 146 (*)    CO2 21 (*)    Glucose, Bld 128 (*)    BUN 5 (*)    AST 658 (*)    ALT 184 (*)    Anion gap 20 (*)    All other components within normal limits  LIPASE, BLOOD - Abnormal; Notable for the following:    Lipase 96 (*)    All other components within normal limits  CK - Abnormal; Notable for the following:    Total CK 306 (*)    All other components within normal limits  CBC WITH DIFFERENTIAL/PLATELET - Abnormal; Notable for the following:    Platelets 146 (*)     All other components within normal limits  MAGNESIUM - Abnormal; Notable for the following:    Magnesium 1.3 (*)    All other components within normal limits  PHOSPHORUS - Abnormal; Notable for the following:    Phosphorus 4.7 (*)    All other components within normal limits  COMPREHENSIVE METABOLIC PANEL - Abnormal; Notable for the following:    Chloride 114 (*)    CO2 17 (*)    Glucose, Bld 118 (*)    BUN <5 (*)    Creatinine, Ser 1.18 (*)    Calcium 7.5 (*)    Total Protein 6.1 (*)    Albumin 3.3 (*)    AST 1,275 (*)    ALT 262 (*)    GFR calc non Af Amer 49 (*)    GFR calc Af Amer 57 (*)    All other components within normal limits  LACTIC ACID, PLASMA - Abnormal; Notable for the following:    Lactic Acid, Venous 2.4 (*)    All other components within normal limits  CBC - Abnormal; Notable for the following:    WBC 11.3 (*)    Platelets 127 (*)    All other components within normal limits  I-STAT CG4 LACTIC ACID, ED - Abnormal; Notable for the following:    Lactic Acid, Venous 7.63 (*)    All other components within normal limits  I-STAT VENOUS BLOOD GAS, ED - Abnormal; Notable for the following:    pH, Ven 7.247 (*)    pO2, Ven 26.0 (*)    Acid-base deficit 7.0 (*)    All other components within normal limits  POC OCCULT BLOOD, ED - Abnormal; Notable for the following:    Fecal Occult Bld POSITIVE (*)    All other components within normal limits  I-STAT CG4 LACTIC ACID, ED - Abnormal; Notable for the following:    Lactic Acid, Venous 3.21 (*)    All other components within normal limits  CULTURE, BLOOD (ROUTINE X 2)  CULTURE, BLOOD (ROUTINE X 2)  C DIFFICILE QUICK SCREEN W PCR REFLEX  MRSA PCR SCREENING  URINE CULTURE  PROTIME-INR  PROCALCITONIN  TSH  CBC WITH DIFFERENTIAL/PLATELET  BLOOD GAS, VENOUS  HEPATITIS PANEL, ACUTE  HIV ANTIBODY (ROUTINE TESTING)  H PYLORI, IGM, IGG, IGA AB  LACTIC ACID, PLASMA  COMPREHENSIVE METABOLIC PANEL  CBC  PROTIME-INR    APTT  AMMONIA  MAGNESIUM  HEPATITIS C VRS RNA DETECT BY PCR-QUAL  ACETAMINOPHEN LEVEL  PROTIME-INR  POC URINE PREG, ED  Randolm Idol, ED    EKG  EKG Interpretation  Date/Time:  Friday June 08 2016 09:17:25 EST Ventricular Rate:  101 PR Interval:    QRS Duration: 93 QT  Interval:  419 QTC Calculation: 544 R Axis:     Text Interpretation:  Sinus tachycardia Abnormal R-wave progression, early transition Prolonged QT interval No STEMI Confirmed by Sherry Ruffing MD, CHRISTOPHER [phone]) on 06/08/2016 5:14:19 PM       Radiology Dg Chest 2 View  Result Date: 06/08/2016 CLINICAL DATA:  Found down. Unresponsive. History of substance abuse. EXAM: CHEST  2 VIEW COMPARISON:  07/11/2005 radiographs. FINDINGS: Suboptimal inspiration on the frontal examination. The heart size and mediastinal contours are normal. The lungs are clear. There is no pleural effusion or pneumothorax. No acute osseous findings are identified. IMPRESSION: No active cardiopulmonary process. Electronically Signed   By: Richardean Sale M.D.   On: 06/08/2016 10:31   Ct Head Wo Contrast  Result Date: 06/08/2016 CLINICAL DATA:  Altered mental status. Found down. History of substance abuse. EXAM: CT HEAD WITHOUT CONTRAST CT CERVICAL SPINE WITHOUT CONTRAST TECHNIQUE: Multidetector CT imaging of the head and cervical spine was performed following the standard protocol without intravenous contrast. Multiplanar CT image reconstructions of the cervical spine were also generated. COMPARISON:  None. FINDINGS: CT HEAD FINDINGS Brain: There is no evidence of acute intracranial hemorrhage, mass lesion, brain edema or extra-axial fluid collection. There is mild generalized atrophy with prominence of the ventricles and subarachnoid spaces. There is no hydrocephalus. Mild periventricular white matter low-density likely reflects small vessel ischemic change. There is no CT evidence of acute cortical infarction. Vascular: Intracranial vascular  calcifications are present. Skull: Negative for fracture or focal lesion. Sinuses/Orbits: There is an old fracture of the lamina papyracea on the left with adjacent partial left ethmoid sinus opacification. The additional visualized paranasal sinuses, mastoid air cells and middle ears are clear. Other: None. CT CERVICAL SPINE FINDINGS Alignment: Straightening without focal angulation or listhesis. Skull base and vertebrae: No evidence of acute cervical spine fracture or traumatic subluxation. Soft tissues and spinal canal: No prevertebral fluid or swelling. No visible canal hematoma. There is asymmetric carotid atherosclerosis of the left. Disc levels: Disc height is relatively maintained throughout the cervical spine. There is probable bilateral facet ankylosis at C2-3 and on the left at C3-4. There is some ossification of the corresponding discs. There is prominent asymmetric right-sided facet hypertrophy at C5-6 and C6-7. No high-grade foraminal narrowing identified. Upper chest: The visualized upper chest appears unremarkable. Other: None. IMPRESSION: 1. No acute intracranial or calvarial findings. 2. No evidence of acute cervical spine fracture, traumatic subluxation or static signs of instability. 3. 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 Electronically Signed   By: Richardean Sale M.D.   On: 06/08/2016 12:04   Ct Cervical Spine Wo Contrast  Result Date: 06/08/2016 CLINICAL DATA:  Altered mental status. Found down. History of substance abuse. EXAM: CT HEAD WITHOUT CONTRAST CT CERVICAL SPINE WITHOUT CONTRAST TECHNIQUE: Multidetector CT imaging of the head and cervical spine was performed following the standard protocol without intravenous contrast. Multiplanar CT image reconstructions of the cervical spine were also generated. COMPARISON:  None. FINDINGS: CT HEAD FINDINGS Brain: There is no evidence of acute intracranial hemorrhage, mass lesion, brain edema  or extra-axial fluid collection. There is mild generalized atrophy with prominence of the ventricles and subarachnoid spaces. There is no hydrocephalus. Mild periventricular white matter low-density likely reflects small vessel ischemic change. There is no CT evidence of acute cortical infarction. Vascular: Intracranial vascular calcifications are present. Skull: Negative for fracture or focal lesion. Sinuses/Orbits: There is an old fracture of the lamina papyracea on  the left with adjacent partial left ethmoid sinus opacification. The additional visualized paranasal sinuses, mastoid air cells and middle ears are clear. Other: None. CT CERVICAL SPINE FINDINGS Alignment: Straightening without focal angulation or listhesis. Skull base and vertebrae: No evidence of acute cervical spine fracture or traumatic subluxation. Soft tissues and spinal canal: No prevertebral fluid or swelling. No visible canal hematoma. There is asymmetric carotid atherosclerosis of the left. Disc levels: Disc height is relatively maintained throughout the cervical spine. There is probable bilateral facet ankylosis at C2-3 and on the left at C3-4. There is some ossification of the corresponding discs. There is prominent asymmetric right-sided facet hypertrophy at C5-6 and C6-7. No high-grade foraminal narrowing identified. Upper chest: The visualized upper chest appears unremarkable. Other: None. IMPRESSION: 1. No acute intracranial or calvarial findings. 2. No evidence of acute cervical spine fracture, traumatic subluxation or static signs of instability. 3. 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 Electronically Signed   By: Richardean Sale M.D.   On: 06/08/2016 12:04    Procedures Procedures (including critical care time)  CRITICAL CARE Performed by: Gwenyth Allegra Tegeler Total critical care time: 45 minutes Critical care time was exclusive of separately billable procedures and  treating other patients. Critical care was necessary to treat or prevent imminent or life-threatening deterioration. Critical care was time spent personally by me on the following activities: development of treatment plan with patient and/or surrogate as well as nursing, discussions with consultants, evaluation of patient's response to treatment, examination of patient, obtaining history from patient or surrogate, ordering and performing treatments and interventions, ordering and review of laboratory studies, ordering and review of radiographic studies, pulse oximetry and re-evaluation of patient's condition.   Medications Ordered in ED Medications  piperacillin-tazobactam (ZOSYN) IVPB 3.375 g (3.375 g Intravenous Given 06/08/16 1900)  vancomycin (VANCOCIN) IVPB 750 mg/150 ml premix (not administered)  LORazepam (ATIVAN) injection 2-3 mg (not administered)  0.45 % sodium chloride infusion ( Intravenous New Bag/Given 06/08/16 1420)  sodium chloride flush (NS) 0.9 % injection 3 mL (not administered)  acetaminophen (TYLENOL) tablet 650 mg (not administered)    Or  acetaminophen (TYLENOL) suppository 650 mg (not administered)  ondansetron (ZOFRAN) tablet 4 mg (not administered)    Or  ondansetron (ZOFRAN) injection 4 mg (not administered)  pantoprazole (PROTONIX) injection 40 mg (not administered)  sodium chloride 0.9 % bolus 1,000 mL (0 mLs Intravenous Stopped 06/08/16 1337)  ondansetron (ZOFRAN) injection 4 mg (4 mg Intravenous Given 06/08/16 1048)  sodium chloride 0.9 % bolus 1,000 mL (0 mLs Intravenous Stopped 06/08/16 1337)    And  sodium chloride 0.9 % bolus 1,000 mL (0 mLs Intravenous Stopped 06/08/16 1337)  piperacillin-tazobactam (ZOSYN) IVPB 3.375 g (0 g Intravenous Stopped 06/08/16 1154)  vancomycin (VANCOCIN) IVPB 1000 mg/200 mL premix (0 mg Intravenous Stopped 06/08/16 1259)  pantoprazole (PROTONIX) injection 40 mg (40 mg Intravenous Given 06/08/16 1357)  magnesium sulfate IVPB 2 g 50 mL  (2 g Intravenous Given 06/08/16 1809)  ondansetron (ZOFRAN) injection 4 mg (4 mg Intravenous Given 06/08/16 1903)     Initial Impression / Assessment and Plan / ED Course  I have reviewed the triage vital signs and the nursing notes.  Pertinent labs & imaging results that were available during my care of the patient were reviewed by me and considered in my medical decision making (see chart for details).    Christie Morrison is a 61 y.o. female With a past medical history  significant for alcohol of these, substance abuse, and prior G.I. Bleed who presents for unresponsiveness and altered mental status. History provided by patient's son and EMS. Today, son found his mother lying on the ground in the bathroom between the toilet in the bathtub unresponsive. She had agonal respirations of approximately three per minute according to EMS on arrival. Patient quickly brought to ED for evaluation.  On arrival, patient Is answering questions appropriately. She has no complaints aside from sleepiness. Patient denies nausea, vomiting, fevers, chills, constipation, or urine changes. Patient had several episodes of bloodied diarrhea in the ED. This was heme positive.  Given concern for G.I. Bleed, Protonix was ordered.  Patient found to have a lactic acid greater than seven. Given altered mental status and lactic acid, patient made code sepsis. Cultures Obtained and antibiotics were given.  Laboratory testing returned showing severe Elevation of liver enzymes. Chest x-ray showed no pneumonia. CT head unremarkable.  Given these abnormalities, patient admitted to step down unit or further management of altered mental status, possible infection, intoxication, and liver dysfunction. Patient admitted in stable condition.     Final Clinical Impressions(s) / ED Diagnoses   Final diagnoses:  Cirrhosis, alcoholic ([place])  Elevated lactic acid level  Acute hepatitis    New Prescriptions There are no discharge  medications for this patient.  Clinical Impression: 1. Cirrhosis, alcoholic ([facility])   2. Elevated lactic acid level   3. Acute hepatitis     Disposition: Admit to Hospitalist service    Courtney Paris, MD 06/09/16 2013

## 2016-06-08 NOTE — Progress Notes (Signed)
Pharmacy Antibiotic Note  Christie Morrison is a 61 y.o. female admitted on 06/08/2016 with sepsis.  Pharmacy has been consulted for vancomycin and zosyn dosing. Pt is hypothermic and WBC is WNL. Scr is WNL but lactic acid is elevated at 7.63.   Plan: Vancomycin 1gm IV x 1 then 750mg  IV Q12H Zosyn 3.375gm IV Q8H (4 hr inf) F/u renal fxn, C&S, clinical status and trough at SS     Temp (24hrs), Avg:96.9 F (36.1 C), Min:96.9 F (36.1 C), Max:96.9 F (36.1 C)   Recent Labs Lab 06/08/16 0948 06/08/16 0956 06/08/16 1047  WBC  --   --  5.1  CREATININE 0.96  --   --   LATICACIDVEN  --  7.63*  --     CrCl cannot be calculated (Unknown ideal weight.).    No Known Allergies  Antimicrobials this admission: Vanc 1/19>> Zosyn 1/19>>  Dose adjustments this admission: N/A  Microbiology results: Pending  Thank you for allowing pharmacy to be a part of this patient's care.  Ulani Degrasse, Rande Lawman 06/08/2016 11:36 AM

## 2016-06-08 NOTE — Progress Notes (Signed)
CRITICAL VALUE ALERT  Critical value received:  Lactic Acid 2.4  Date of notification:  06/08/16  Time of notification:  1715  Critical value read back:  yes  Nurse who received alert:  Kalee Broxton  MD notified (1st page):  Lissa Merlin  Time of first page:   MD notified (2nd page):  Time of second page:  Responding MD:    Time MD responded:

## 2016-06-08 NOTE — ED Triage Notes (Signed)
Pt to ED via [facility]-- son called 911 after finding mother on floor between toilet and bathtub. States she had been drinking heavily and smoking crack all night-- pt had agaonal resp on EMS arrival-- BVM used, pt became more responsive, on arrival to ED pt is responsive to verbal stimuli, oriented x 4, cool to touch,  Son at bedside.

## 2016-06-08 NOTE — Consult Note (Signed)
Referring Provider: [facility]  Primary Care Physician:  No primary care provider on file. Primary Gastroenterologist:   Silvano Rusk, MD (2004)  Reason for Consultation:  GI bleed  IMPRESSION / PLAN:   1. 61 yo female with acute nausea, vomiting, diarrhea, mild leukocytosis, hypotension and elevated lactic acid. This could be viral gastroenteritis. C-diff is negative.  -continue IV hydration. Lactic acid improving -Primary team has started Zosyn empirically -Urine culture is pending -prn anti-emetics as ordered.  -agree with IV protonix, will reduced to once daily.  -am cbc  2. Hepatitis, ? Etoh related as she has history of abuse, drinks daily. ETOH level in 300's in ED.  No medications at home but + cocaine in UDS. AST is higher than typically seen with ETOH alone.  CK also elevated.  -obtain RUQ -trend LFTs -check viral hepatitis studies -on CIWA protocol -HIV ordered in ED  3. Heme positive light brown diarrheal stools. Normal BUN and normal hgb. May be heme positive from perianal irritation, ETOH gastritis. No evidence for significant GI bleed at present.  -monitor CBC -daily PPI  4. Mild thrombocytopenia. Ammonia elevated. Cirrhosis not excluded. . Depressed platelets possibly secondary to bone marrow suppression from ETOH or possibly secondary to sepsis.  -RUQ ultrasound pending   HPI: Christie Morrison is a 61 y.o. female who had a colonoscopy by [place] in 2004 for bloating. Exam incomplete, only to transverse colon. Diverticulosis found. She had an EGD by [place] in 2005 for vomiting / hematemesis. Findings included a MWT and mild duodenitis.   Patient presented to ED with nausea, vomiting and diarrhea which all started this am. Emesis non-bloody. Patient has no abdominal pain. No fevers. No known sick contacts. Typically stools are  solid. Multiple loose brown stools in ED. Patient fell at home. CT cervical spine negative for fractures.   Patient has a history of ETOH  abuse. Drinks beer or liquor on daily basis. ETOH level 387, UDS + cocaine. Initial lactic acid level 7.6, down now to 3.21. Hgb ~13, normal WBC. BUN 5. Anion gap 20. Ammonia 65. AST 658, ALT 184, normal Tbili. CK 306.    Past Medical History:  Diagnosis Date  . Crack cocaine use   . ETOH abuse   . High cholesterol     Past Surgical History:  Procedure Laterality Date  . CESAREAN SECTION      Prior to Admission medications   Not on File  No medication at home  Current Facility-Administered Medications  Medication Dose Route Frequency Provider Last Rate Last Dose  . 0.45 % sodium chloride infusion   Intravenous Continuous Samella Parr, NP 150 mL/hr at 06/08/16 1420    . LORazepam (ATIVAN) injection 2-3 mg  2-3 mg Intravenous Q1H PRN Samella Parr, NP      . pantoprazole (PROTONIX) injection 40 mg  40 mg Intravenous Q12H Samella Parr, NP      . piperacillin-tazobactam (ZOSYN) IVPB 3.375 g  3.375 g Intravenous Q8H Rachel L Rumbarger, RPH      . [START ON 06/09/2016] vancomycin (VANCOCIN) IVPB 750 mg/150 ml premix  750 mg Intravenous Q12H Rachel L Rumbarger, RPH       No current outpatient prescriptions on file.    Allergies as of 06/08/2016  . (No Known Allergies)    Family History  Problem Relation Age of Onset  . Diabetes Mellitus II Mother   . Kidney failure Mother   . Alcohol abuse Mother   . Diabetes  Mellitus II Brother   . Kidney failure Brother   . Alcohol abuse Maternal Grandmother     Social History   Social History  . Marital status: Married    Spouse name: N/A  . Number of children: N/A  . Years of education: N/A   Occupational History  . Not on file.   Social History Main Topics  . Smoking status: Never Smoker  . Smokeless tobacco: Never Used  . Alcohol use Yes     Comment: drinks 3-4 40ounce beers plus a fifth of liquor daily   . Drug use: Yes    Types: Cocaine     Comment: crack -- not every day-   . Sexual activity: No   Other  Topics Concern  . Not on file   Social History Narrative  . No narrative on file    Review of Systems: All systems reviewed and negative except where noted in HPI.  Physical Exam: Vital signs in last 24 hours: Temp:  [96.9 F (36.1 C)] 96.9 F (36.1 C) (01/19 1015) Pulse Rate:  [90-109] 109 (01/19 1445) Resp:  [15-25] 20 (01/19 1445) BP: (74-139)/(56-87) 139/87 (01/19 1445) SpO2:  [95 %-100 %] 98 % (01/19 1445) Weight:  [125 lb (56.7 kg)] 125 lb (56.7 kg) (01/19 1158)   General:   Alert, thin black female in NAD. Son in room Head:  Normocephalic and atraumatic. Eyes:  Sclera clear, no icterus.   Conjunctiva pink. Ears:  Normal auditory acuity. Nose:  No deformity, discharge,  or lesions. Mouth:  No deformity or lesions. Tongue is dry. Poor dentition  Neck:  Supple; no masses  Lungs:  Clear throughout to auscultation.   No wheezes, crackles, or rhonchi.  Heart:  Regular rate and rhythm; no murmurs, clicks, rubs,  or gallops. Abdomen:  Soft,nontender, BS active,nonpalp mass.   Rectal:  Scant soft / liquid light brown stool in vault  Msk:  Symmetrical without gross deformities. . Pulses:  Normal pulses noted. Extremities:  Without clubbing or edema. Neurologic:  Alert and  oriented x4;  grossly normal neurologically. Skin:  Intact without significant lesions or rashes.. Psych:  Alert and cooperative. Flat affect.  Intake/Output from previous day: No intake/output data recorded. Intake/Output this shift: Total I/O In: 3000 [I.V.:3000] Out: 125 [Urine:125]  Lab Results:  Recent Labs  06/08/16 1047  WBC 5.1  HGB 13.6  HCT 41.2  PLT 146*   BMET  Recent Labs  06/08/16 0948  NA 146*  K 3.7  CL 105  CO2 21*  GLUCOSE 128*  BUN 5*  CREATININE 0.96  CALCIUM 9.2   LFT  Recent Labs  06/08/16 0948  PROT 7.3  ALBUMIN 4.0  AST 658*  ALT 184*  ALKPHOS 85  BILITOT 0.6   PT/INR  Recent Labs  06/08/16 1046  LABPROT 13.3  INR 1.01     Studies/Results: Dg Chest 2 View  Result Date: 06/08/2016 CLINICAL DATA:  Found down. Unresponsive. History of substance abuse. EXAM: CHEST  2 VIEW COMPARISON:  07/11/2005 radiographs. FINDINGS: Suboptimal inspiration on the frontal examination. The heart size and mediastinal contours are normal. The lungs are clear. There is no pleural effusion or pneumothorax. No acute osseous findings are identified. IMPRESSION: No active cardiopulmonary process. Electronically Signed   By: Richardean Sale M.D.   On: 06/08/2016 10:31   Ct Head Wo Contrast  Result Date: 06/08/2016 CLINICAL DATA:  Altered mental status. Found down. History of substance abuse. EXAM: CT HEAD WITHOUT CONTRAST CT CERVICAL  SPINE WITHOUT CONTRAST TECHNIQUE: Multidetector CT imaging of the head and cervical spine was performed following the standard protocol without intravenous contrast. Multiplanar CT image reconstructions of the cervical spine were also generated. COMPARISON:  None. FINDINGS: CT HEAD FINDINGS Brain: There is no evidence of acute intracranial hemorrhage, mass lesion, brain edema or extra-axial fluid collection. There is mild generalized atrophy with prominence of the ventricles and subarachnoid spaces. There is no hydrocephalus. Mild periventricular white matter low-density likely reflects small vessel ischemic change. There is no CT evidence of acute cortical infarction. Vascular: Intracranial vascular calcifications are present. Skull: Negative for fracture or focal lesion. Sinuses/Orbits: There is an old fracture of the lamina papyracea on the left with adjacent partial left ethmoid sinus opacification. The additional visualized paranasal sinuses, mastoid air cells and middle ears are clear. Other: None. CT CERVICAL SPINE FINDINGS Alignment: Straightening without focal angulation or listhesis. Skull base and vertebrae: No evidence of acute cervical spine fracture or traumatic subluxation. Soft tissues and spinal canal: No  prevertebral fluid or swelling. No visible canal hematoma. There is asymmetric carotid atherosclerosis of the left. Disc levels: Disc height is relatively maintained throughout the cervical spine. There is probable bilateral facet ankylosis at C2-3 and on the left at C3-4. There is some ossification of the corresponding discs. There is prominent asymmetric right-sided facet hypertrophy at C5-6 and C6-7. No high-grade foraminal narrowing identified. Upper chest: The visualized upper chest appears unremarkable. Other: None. IMPRESSION: 1. No acute intracranial or calvarial findings. 2. No evidence of acute cervical spine fracture, traumatic subluxation or static signs of instability. 3. 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 Electronically Signed   By: Richardean Sale M.D.   On: 06/08/2016 12:04   Ct Cervical Spine Wo Contrast  Result Date: 06/08/2016 CLINICAL DATA:  Altered mental status. Found down. History of substance abuse. EXAM: CT HEAD WITHOUT CONTRAST CT CERVICAL SPINE WITHOUT CONTRAST TECHNIQUE: Multidetector CT imaging of the head and cervical spine was performed following the standard protocol without intravenous contrast. Multiplanar CT image reconstructions of the cervical spine were also generated. COMPARISON:  None. FINDINGS: CT HEAD FINDINGS Brain: There is no evidence of acute intracranial hemorrhage, mass lesion, brain edema or extra-axial fluid collection. There is mild generalized atrophy with prominence of the ventricles and subarachnoid spaces. There is no hydrocephalus. Mild periventricular white matter low-density likely reflects small vessel ischemic change. There is no CT evidence of acute cortical infarction. Vascular: Intracranial vascular calcifications are present. Skull: Negative for fracture or focal lesion. Sinuses/Orbits: There is an old fracture of the lamina papyracea on the left with adjacent partial left ethmoid sinus  opacification. The additional visualized paranasal sinuses, mastoid air cells and middle ears are clear. Other: None. CT CERVICAL SPINE FINDINGS Alignment: Straightening without focal angulation or listhesis. Skull base and vertebrae: No evidence of acute cervical spine fracture or traumatic subluxation. Soft tissues and spinal canal: No prevertebral fluid or swelling. No visible canal hematoma. There is asymmetric carotid atherosclerosis of the left. Disc levels: Disc height is relatively maintained throughout the cervical spine. There is probable bilateral facet ankylosis at C2-3 and on the left at C3-4. There is some ossification of the corresponding discs. There is prominent asymmetric right-sided facet hypertrophy at C5-6 and C6-7. No high-grade foraminal narrowing identified. Upper chest: The visualized upper chest appears unremarkable. Other: None. IMPRESSION: 1. No acute intracranial or calvarial findings. 2. No evidence of acute cervical spine fracture, traumatic subluxation or static signs  of instability. 3. 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 Electronically Signed   By: Richardean Sale M.D.   On: 06/08/2016 12:04     Tye Savoy NP 06/08/2016, 4:29 PM  Pager number [phone]

## 2016-06-08 NOTE — ED Notes (Signed)
CBC clotted-- will need redrawn

## 2016-06-08 NOTE — ED Notes (Signed)
Pt has had 2 liquid stools-- some blood noted in stool

## 2016-06-09 ENCOUNTER — Inpatient Hospital Stay (HOSPITAL_COMMUNITY): Payer: Self-pay

## 2016-06-09 DIAGNOSIS — F1010 Alcohol abuse, uncomplicated: Secondary | ICD-10-CM

## 2016-06-09 DIAGNOSIS — E870 Hyperosmolality and hypernatremia: Secondary | ICD-10-CM

## 2016-06-09 DIAGNOSIS — B179 Acute viral hepatitis, unspecified: Secondary | ICD-10-CM

## 2016-06-09 DIAGNOSIS — D696 Thrombocytopenia, unspecified: Secondary | ICD-10-CM

## 2016-06-09 DIAGNOSIS — R7989 Other specified abnormal findings of blood chemistry: Secondary | ICD-10-CM

## 2016-06-09 LAB — CBC
HEMATOCRIT: 35 % — AB (ref 36.0–46.0)
HEMOGLOBIN: 11.5 g/dL — AB (ref 12.0–15.0)
MCH: 32.6 pg (ref 26.0–34.0)
MCHC: 32.9 g/dL (ref 30.0–36.0)
MCV: 99.2 fL (ref 78.0–100.0)
Platelets: 128 10*3/uL — ABNORMAL LOW (ref 150–400)
RBC: 3.53 MIL/uL — ABNORMAL LOW (ref 3.87–5.11)
RDW: 13.8 % (ref 11.5–15.5)
WBC: 8.5 10*3/uL (ref 4.0–10.5)

## 2016-06-09 LAB — PROTIME-INR
INR: 1.1
Prothrombin Time: 14.2 seconds (ref 11.4–15.2)

## 2016-06-09 LAB — HEPATITIS PANEL, ACUTE
HEP A IGM: NEGATIVE
HEP B C IGM: NEGATIVE
HEP B S AG: NEGATIVE

## 2016-06-09 LAB — COMPREHENSIVE METABOLIC PANEL
ALBUMIN: 3.1 g/dL — AB (ref 3.5–5.0)
ALK PHOS: 72 U/L (ref 38–126)
ALT: 242 U/L — ABNORMAL HIGH (ref 14–54)
ANION GAP: 12 (ref 5–15)
AST: 872 U/L — AB (ref 15–41)
BILIRUBIN TOTAL: 0.9 mg/dL (ref 0.3–1.2)
BUN: 9 mg/dL (ref 6–20)
CO2: 20 mmol/L — AB (ref 22–32)
Calcium: 7.5 mg/dL — ABNORMAL LOW (ref 8.9–10.3)
Chloride: 112 mmol/L — ABNORMAL HIGH (ref 101–111)
Creatinine, Ser: 1.56 mg/dL — ABNORMAL HIGH (ref 0.44–1.00)
GFR calc Af Amer: 41 mL/min — ABNORMAL LOW (ref >60)
GFR calc non Af Amer: 35 mL/min — ABNORMAL LOW (ref >60)
GLUCOSE: 89 mg/dL (ref 65–99)
POTASSIUM: 3.6 mmol/L (ref 3.5–5.1)
Sodium: 144 mmol/L (ref 135–145)
TOTAL PROTEIN: 5.8 g/dL — AB (ref 6.5–8.1)

## 2016-06-09 LAB — URINE CULTURE: Culture: NO GROWTH

## 2016-06-09 LAB — LACTIC ACID, PLASMA: Lactic Acid, Venous: 1.5 mmol/L (ref 0.5–1.9)

## 2016-06-09 LAB — APTT: aPTT: 26 seconds (ref 24–36)

## 2016-06-09 LAB — AMMONIA: Ammonia: 47 umol/L — ABNORMAL HIGH (ref 9–35)

## 2016-06-09 LAB — MAGNESIUM: MAGNESIUM: 1.8 mg/dL (ref 1.7–2.4)

## 2016-06-09 LAB — HIV ANTIBODY (ROUTINE TESTING W REFLEX): HIV Screen 4th Generation wRfx: NONREACTIVE

## 2016-06-09 MED ORDER — VANCOMYCIN HCL IN DEXTROSE 1-5 GM/200ML-% IV SOLN
1000.0000 mg | INTRAVENOUS | Status: DC
  Administered 2016-06-09: 1000 mg via INTRAVENOUS
  Filled 2016-06-09: qty 200

## 2016-06-09 MED ORDER — MAGNESIUM SULFATE 2 GM/50ML IV SOLN
2.0000 g | Freq: Once | INTRAVENOUS | Status: AC
  Administered 2016-06-09: 2 g via INTRAVENOUS
  Filled 2016-06-09: qty 50

## 2016-06-09 NOTE — Progress Notes (Signed)
Pharmacy Antibiotic Note  Christie Morrison is a 61 y.o. female admitted on 06/08/2016 with sepsis.  Pharmacy has been consulted for vancomycin and zosyn dosing. On admission, patient was hypothermic and WBC was WNL. Scr was WNL but lactic acid was elevated at 7.63.   Patient's serum creatinine worsened overnight to 1.56 mg/dL (from 1.18 last evening). Little urine output charted yesterday. Will empirically decrease vancomycin dose to avoid accumulation. Zosyn dose still okay.   Plan: Decrease to Vancomycin 1000mg  IV every 24 hours Zosyn 3.375gm IV Q8H (4 hr inf) F/u renal fxn, C&S, clinical status and trough at SS  Height: 5\' 3"  (160 cm) Weight: 143 lb 8.3 oz (65.1 kg) IBW/kg (Calculated) : 52.4  Temp (24hrs), Avg:98.8 F (37.1 C), Min:98.3 F (36.8 C), Max:99.9 F (37.7 C)   Recent Labs Lab 06/08/16 0948 06/08/16 0956 06/08/16 1047 06/08/16 1317 06/08/16 1608 06/09/16 0338  WBC  --   --  5.1  --  11.3* 8.5  CREATININE 0.96  --   --   --  1.18* 1.56*  LATICACIDVEN  --  7.63*  --  3.21* 2.4* 1.5    Estimated Creatinine Clearance: 35.2 mL/min (by C-G formula based on SCr of 1.56 mg/dL (H)).    No Known Allergies  Antimicrobials this admission: Vanc 1/19>> Zosyn 1/19>>  Dose adjustments this admission: N/A  Microbiology results: 1/19 BCx: NG 1/19 UCx: NG  1/19 MRSA PCR: neg 1/19 C Diff: neg   Thank you for allowing pharmacy to be a part of this patient's care.  Demetrius Charity, PharmD Acute Care Pharmacy Resident  Pager: [phone] 06/09/2016

## 2016-06-09 NOTE — Progress Notes (Addendum)
Patient ID: Christie Morrison, female   DOB: [birth year], 61 y.o.   MRN: [id number]  PROGRESS NOTE    Christie Morrison  KZS:[id number] DOB: Sep 05, 1955 DOA: 06/08/2016  PCP: No primary care provider on file.   Brief Narrative:  61 y.o. female with medical history significant for alcohol and cocaine abuse. Patient previously hospitalized in 2012 with intractable nausea and vomiting associated to Mallory-Weiss tear and mild duodenitis found on EGD. Patient's son called EMS to the home today after finding his mother lying on the floor between the toilet and the bathtub. She had been drinking heavily and smoking crack all night. EMS arrived to the home and the patient was noted with agonal respiratory effort. Bag valve mask was utilized at which point patient became more alert and responsive. Patient was given IV fluids. Alcohol level was 387, ammonia 67, lipase 96, lactic acid 7.63, CK 306, normal troponin, magnesium was 1.3. Cr was WNL.Sodium was 146. Platelets were 146. C diff was negative. FOBT was positive. UDS was positive for cocaine, tylenol was less than 10. No ketones on UA and no evidence of UTI. CT head and cervical spine showed no acute intracranial changes. Abd US showed cirrhosis. Pt empirically started on vanco and zosyn as pt met sepsis criteria although no clear source of infection is evident.   Assessment & Plan:   Principal Problem: Sepsis / Lactic acidosis - Sepsis criteria met on admission with hypothermia, tachycardia, tachypnea, hypotension needing multiple IV fluid resuscitations, lactic acidosis of 7.63 (although this can be attributed to alcohol intoxication as well) - No clear source of infection  - Continue empiric vanco and zosyn  - Urine cx pending - Blood cx so far negative - Lactic acid has normalized - Pt will remain on IV fluids for next 24 hours primarily because we need to make sure BP stabilizes and also to help with renal insufficiency   Active Problems: Acute  alcohol intoxication (HCC) - Monitor for withdrawals - Alcohol in high 300 range on admission - Counseled on alcohol cessation; already with evidence of cirrhosis on abd [place]  Melena / Anemia of chronic disease  - FOBT positive on admission - Hgb 11.5 today - Will continue to monitor daily CBC - Continue protonix  Cocaine abuse - Cocaine detected on UDS - Counseled on cessation of drug abuse   Thrombocytopenia (HCC) - Due to bone marrow suppression from alcohol abuse - On SCD's for DVT prophylaxis - Platelets 128   Acute hypernatremia - Likely dehydration - Sodium normalized with IV fluids   Acute kidney injury - Initial Cr WNL but now up, possibly from vanco so will stop abx as soon as we get urine cx report  - Check BMP in am   DVT prophylaxis: SCD's bilaterally  Code Status: full code  Family Communication: no family at the bedside this am Disposition Plan: home in next 2-3 days if pt feels better    Consultants:   None   Procedures:   None   Antimicrobials:   Vanco and zosyn    Subjective: No overnight events.   Objective: Vitals:   06/08/16 2340 06/09/16 0000 06/09/16 0200 06/09/16 0400  BP: 120/82 122/74 113/75 125/73  Pulse: 96 92 73 73  Resp: 14 13 16  (!) 23  Temp: 98.4 F (36.9 C)   98.6 F (37 C)  TempSrc: Oral   Oral  SpO2: 100% 100% 100% 100%  Weight:      Height:  Intake/Output Summary (Last 24 hours) at 06/09/16 0809 Last data filed at 06/09/16 0400  Gross per 24 hour  Intake             4950 ml  Output              126 ml  Net             4824 ml   Filed Weights   06/08/16 1158 06/08/16 1725  Weight: 56.7 kg (125 lb) 65.1 kg (143 lb 8.3 oz)    Examination:  General exam: Appears calm and comfortable  Respiratory system: Clear to auscultation. Respiratory effort normal. Cardiovascular system: S1 & S2 heard, RRR. No JVD. Gastrointestinal system: Abdomen is nondistended, soft and nontender. No organomegaly or masses  felt. Normal bowel sounds heard. Central nervous system: Alert and oriented. No focal neurological deficits. Extremities: Symmetric 5 x 5 power. Skin: No rashes, lesions or ulcers Psychiatry: Judgement and insight appear normal. Mood & affect appropriate.   Data Reviewed: I have personally reviewed following labs and imaging studies  CBC:  Recent Labs Lab 06/08/16 1047 06/08/16 1608 06/09/16 0338  WBC 5.1 11.3* 8.5  NEUTROABS 2.6  --   --   HGB 13.6 12.5 11.5*  HCT 41.2 38.2 35.0*  MCV 98.8 98.7 99.2  PLT 146* 127* 371*   Basic Metabolic Panel:  Recent Labs Lab 06/08/16 0948 06/08/16 1608 06/09/16 0338  NA 146* 145 144  K 3.7 3.7 3.6  CL 105 114* 112*  CO2 21* 17* 20*  GLUCOSE 128* 118* 89  BUN 5* <5* 9  CREATININE 0.96 1.18* 1.56*  CALCIUM 9.2 7.5* 7.5*  MG  --  1.3* 1.8  PHOS  --  4.7*  --    GFR: Estimated Creatinine Clearance: 35.2 mL/min (by C-G formula based on SCr of 1.56 mg/dL (H)). Liver Function Tests:  Recent Labs Lab 06/08/16 0948 06/08/16 1608 06/09/16 0338  AST 658* 1,275* 872*  ALT 184* 262* 242*  ALKPHOS 85 88 72  BILITOT 0.6 0.8 0.9  PROT 7.3 6.1* 5.8*  ALBUMIN 4.0 3.3* 3.1*    Recent Labs Lab 06/08/16 0948  LIPASE 96*    Recent Labs Lab 06/08/16 0948 06/09/16 0338  AMMONIA 65* 47*   Coagulation Profile:  Recent Labs Lab 06/08/16 1046 06/08/16 2034 06/09/16 0338  INR 1.01 1.06 1.10   Cardiac Enzymes:  Recent Labs Lab 06/08/16 1046  CKTOTAL 306*   BNP (last 3 results) No results for input(s): PROBNP in the last 8760 hours. HbA1C: No results for input(s): HGBA1C in the last 72 hours. CBG: No results for input(s): GLUCAP in the last 168 hours. Lipid Profile: No results for input(s): CHOL, HDL, LDLCALC, TRIG, CHOLHDL, LDLDIRECT in the last 72 hours. Thyroid Function Tests:  Recent Labs  06/08/16 1608  TSH 1.680   Anemia Panel: No results for input(s): VITAMINB12, FOLATE, FERRITIN, TIBC, IRON, RETICCTPCT  in the last 72 hours. Urine analysis:    Component Value Date/Time   COLORURINE YELLOW 06/08/2016 1335   APPEARANCEUR HAZY (A) 06/08/2016 1335   LABSPEC 1.008 06/08/2016 1335   PHURINE 7.0 06/08/2016 1335   GLUCOSEU 50 (A) 06/08/2016 1335   HGBUR MODERATE (A) 06/08/2016 1335   BILIRUBINUR NEGATIVE 06/08/2016 1335   KETONESUR NEGATIVE 06/08/2016 1335   PROTEINUR 100 (A) 06/08/2016 1335   NITRITE NEGATIVE 06/08/2016 1335   LEUKOCYTESUR NEGATIVE 06/08/2016 1335   Sepsis Labs: @LABRCNTIP (procalcitonin:4,lacticidven:4)   ) Recent Results (from the past 240 hour(s))  Blood Culture (  routine x 2)     Status: None (Preliminary result)   Collection Time: 06/08/16 10:35 AM  Result Value Ref Range Status   Specimen Description BLOOD RIGHT ANTECUBITAL  Final   Special Requests IN PEDIATRIC BOTTLE 3CC  Final   Culture NO GROWTH < 12 HOURS  Final   Report Status PENDING  Incomplete  Blood Culture (routine x 2)     Status: None (Preliminary result)   Collection Time: 06/08/16 10:40 AM  Result Value Ref Range Status   Specimen Description BLOOD RIGHT FOREARM  Final   Special Requests BOTTLES DRAWN AEROBIC ONLY 5CC  Final   Culture NO GROWTH < 12 HOURS  Final   Report Status PENDING  Incomplete  C difficile quick scan w PCR reflex     Status: None   Collection Time: 06/08/16 12:29 PM  Result Value Ref Range Status   C Diff antigen NEGATIVE NEGATIVE Final   C Diff toxin NEGATIVE NEGATIVE Final   C Diff interpretation No C. difficile detected.  Final  MRSA PCR Screening     Status: None   Collection Time: 06/08/16  5:46 PM  Result Value Ref Range Status   MRSA by PCR NEGATIVE NEGATIVE Final    Comment:        The GeneXpert MRSA Assay (FDA approved for NASAL specimens only), is one component of a comprehensive MRSA colonization surveillance program. It is not intended to diagnose MRSA infection nor to guide or monitor treatment for MRSA infections.       Radiology Studies: Dg  Chest 2 View Result Date: 06/08/2016 No active cardiopulmonary process.   Ct Head Wo Contrast Result Date: 06/08/2016 1. No acute intracranial or calvarial findings. 2. No evidence of acute cervical spine fracture, traumatic subluxation or static signs of instability. 3. 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   Ct Cervical Spine Wo Contrast Result Date: 06/08/2016  1. No acute intracranial or calvarial findings. 2. No evidence of acute cervical spine fracture, traumatic subluxation or static signs of instability. 3. 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  US Abdomen Complete Result Date: 06/09/2016 1. Echogenic, coarse liver consistent with cirrhosis. 2. Multiple layering stones in the gallbladder. Negative sonographic Murphy's. Wall thickness is normal, however there is moderate to large amount of slightly complex fluid adjacent to the gallbladder. If acute cholecystitis is suspected clinically, correlation with HIDA scan could be obtained. 3. Fluid-filled bowel or loculated fluid collections in the left upper quadrant adjacent to the spleen.   [place] Art/ven Flow Abd Pelv Doppler Result Date: 06/09/2016 Normal hepatic Doppler. Normal hepatopetal blood flow in the portal veins without thrombus. Patent hepatic veins.    Scheduled Meds: . pantoprazole (PROTONIX) IV  40 mg Intravenous Daily  . piperacillin-tazobactam (ZOSYN)  IV  3.375 g Intravenous Q8H  . sodium chloride flush  3 mL Intravenous Q12H  . vancomycin  750 mg Intravenous Q12H   Continuous Infusions: . sodium chloride 150 mL/hr at 06/09/16 0617     LOS: 1 day    Time spent: 25 minutes  Greater than 50% of the time spent on counseling and coordinating the care.   Leisa Lenz, MD [facility] Pager [phone]  If 7PM-7AM, please contact night-coverage [email or web address] Password [email or web address] 06/09/2016, 8:09 AM

## 2016-06-09 NOTE — Progress Notes (Signed)
Progress Note   Subjective  Patient feeling better today. She is tolerating a diet. She denies pain.    Objective   Vital signs in last 24 hours: Temp:  [98.2 F (36.8 C)-99.9 F (37.7 C)] 98.2 F (36.8 C) (01/20 1600) Pulse Rate:  [71-110] 81 (01/20 1400) Resp:  [13-23] 18 (01/20 1200) BP: (113-155)/(73-93) 124/77 (01/20 1400) SpO2:  [99 %-100 %] 100 % (01/20 1200) Weight:  [143 lb 8.3 oz (65.1 kg)] 143 lb 8.3 oz (65.1 kg) (01/19 1725) Last BM Date: 06/09/16 General:    AA female in NAD Heart:  Regular rate and rhythm;  Lungs: Respirations even and unlabored, Abdomen:  Soft, nontender and nondistended.  Extremities:  Without edema. Neurologic:  Alert and oriented,  grossly normal neurologically. Psych:  Cooperative. Normal mood and affect.  Intake/Output from previous day: 01/19 0701 - 01/20 0700 In: 4950 [I.V.:4650; IV Piggyback:300] Out: 126 [Urine:125; Emesis/NG output:1] Intake/Output this shift: No intake/output data recorded.  Lab Results:  Recent Labs  06/08/16 1047 06/08/16 1608 06/09/16 0338  WBC 5.1 11.3* 8.5  HGB 13.6 12.5 11.5*  HCT 41.2 38.2 35.0*  PLT 146* 127* 128*   BMET  Recent Labs  06/08/16 0948 06/08/16 1608 06/09/16 0338  NA 146* 145 144  K 3.7 3.7 3.6  CL 105 114* 112*  CO2 21* 17* 20*  GLUCOSE 128* 118* 89  BUN 5* <5* 9  CREATININE 0.96 1.18* 1.56*  CALCIUM 9.2 7.5* 7.5*   LFT  Recent Labs  06/09/16 0338  PROT 5.8*  ALBUMIN 3.1*  AST 872*  ALT 242*  ALKPHOS 72  BILITOT 0.9   PT/INR  Recent Labs  06/08/16 2034 06/09/16 0338  LABPROT 13.8 14.2  INR 1.06 1.10    Studies/Results: X-ray Chest Pa And Lateral  Result Date: 06/09/2016 CLINICAL DATA:  Elevated lactic acid level.  Chronic cough. EXAM: CHEST  2 VIEW COMPARISON:  Chest radiograph from one day prior. FINDINGS: Stable cardiomediastinal silhouette with normal heart size and aortic atherosclerosis. No pneumothorax. No pleural effusion. Lungs appear  clear, with no acute consolidative airspace disease and no pulmonary edema. IMPRESSION: No active cardiopulmonary disease. Aortic atherosclerosis. Electronically Signed   By: Ilona Sorrel M.D.   On: 06/09/2016 09:14   Dg Chest 2 View  Result Date: 06/08/2016 CLINICAL DATA:  Found down. Unresponsive. History of substance abuse. EXAM: CHEST  2 VIEW COMPARISON:  07/11/2005 radiographs. FINDINGS: Suboptimal inspiration on the frontal examination. The heart size and mediastinal contours are normal. The lungs are clear. There is no pleural effusion or pneumothorax. No acute osseous findings are identified. IMPRESSION: No active cardiopulmonary process. Electronically Signed   By: Richardean Sale M.D.   On: 06/08/2016 10:31   Ct Head Wo Contrast  Result Date: 06/08/2016 CLINICAL DATA:  Altered mental status. Found down. History of substance abuse. EXAM: CT HEAD WITHOUT CONTRAST CT CERVICAL SPINE WITHOUT CONTRAST TECHNIQUE: Multidetector CT imaging of the head and cervical spine was performed following the standard protocol without intravenous contrast. Multiplanar CT image reconstructions of the cervical spine were also generated. COMPARISON:  None. FINDINGS: CT HEAD FINDINGS Brain: There is no evidence of acute intracranial hemorrhage, mass lesion, brain edema or extra-axial fluid collection. There is mild generalized atrophy with prominence of the ventricles and subarachnoid spaces. There is no hydrocephalus. Mild periventricular white matter low-density likely reflects small vessel ischemic change. There is no CT evidence of acute cortical infarction. Vascular: Intracranial vascular calcifications are present. Skull: Negative  for fracture or focal lesion. Sinuses/Orbits: There is an old fracture of the lamina papyracea on the left with adjacent partial left ethmoid sinus opacification. The additional visualized paranasal sinuses, mastoid air cells and middle ears are clear. Other: None. CT CERVICAL SPINE  FINDINGS Alignment: Straightening without focal angulation or listhesis. Skull base and vertebrae: No evidence of acute cervical spine fracture or traumatic subluxation. Soft tissues and spinal canal: No prevertebral fluid or swelling. No visible canal hematoma. There is asymmetric carotid atherosclerosis of the left. Disc levels: Disc height is relatively maintained throughout the cervical spine. There is probable bilateral facet ankylosis at C2-3 and on the left at C3-4. There is some ossification of the corresponding discs. There is prominent asymmetric right-sided facet hypertrophy at C5-6 and C6-7. No high-grade foraminal narrowing identified. Upper chest: The visualized upper chest appears unremarkable. Other: None. IMPRESSION: 1. No acute intracranial or calvarial findings. 2. No evidence of acute cervical spine fracture, traumatic subluxation or static signs of instability. 3. 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 Electronically Signed   By: Richardean Sale M.D.   On: 06/08/2016 12:04   Ct Cervical Spine Wo Contrast  Result Date: 06/08/2016 CLINICAL DATA:  Altered mental status. Found down. History of substance abuse. EXAM: CT HEAD WITHOUT CONTRAST CT CERVICAL SPINE WITHOUT CONTRAST TECHNIQUE: Multidetector CT imaging of the head and cervical spine was performed following the standard protocol without intravenous contrast. Multiplanar CT image reconstructions of the cervical spine were also generated. COMPARISON:  None. FINDINGS: CT HEAD FINDINGS Brain: There is no evidence of acute intracranial hemorrhage, mass lesion, brain edema or extra-axial fluid collection. There is mild generalized atrophy with prominence of the ventricles and subarachnoid spaces. There is no hydrocephalus. Mild periventricular white matter low-density likely reflects small vessel ischemic change. There is no CT evidence of acute cortical infarction. Vascular: Intracranial  vascular calcifications are present. Skull: Negative for fracture or focal lesion. Sinuses/Orbits: There is an old fracture of the lamina papyracea on the left with adjacent partial left ethmoid sinus opacification. The additional visualized paranasal sinuses, mastoid air cells and middle ears are clear. Other: None. CT CERVICAL SPINE FINDINGS Alignment: Straightening without focal angulation or listhesis. Skull base and vertebrae: No evidence of acute cervical spine fracture or traumatic subluxation. Soft tissues and spinal canal: No prevertebral fluid or swelling. No visible canal hematoma. There is asymmetric carotid atherosclerosis of the left. Disc levels: Disc height is relatively maintained throughout the cervical spine. There is probable bilateral facet ankylosis at C2-3 and on the left at C3-4. There is some ossification of the corresponding discs. There is prominent asymmetric right-sided facet hypertrophy at C5-6 and C6-7. No high-grade foraminal narrowing identified. Upper chest: The visualized upper chest appears unremarkable. Other: None. IMPRESSION: 1. No acute intracranial or calvarial findings. 2. No evidence of acute cervical spine fracture, traumatic subluxation or static signs of instability. 3. 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 Electronically Signed   By: Richardean Sale M.D.   On: 06/08/2016 12:04   US Abdomen Complete  Result Date: 06/09/2016 CLINICAL DATA:  Cirrhosis EXAM: ABDOMEN ULTRASOUND COMPLETE COMPARISON:  04/07/2004 FINDINGS: Gallbladder: Multiple calcified layering stones in the gallbladder. Gallbladder wall thickness is 3 mm. Moderate to large complex fluid adjacent to the gallbladder, measuring 1.8 cm in thickness. Common bile duct: Diameter: Normal at 3.2 mm. Liver: Increased echogenicity.  No focal hepatic abnormality. IVC: No abnormality visualized. Pancreas: Visualized portion  unremarkable. Spleen: Size and appearance  within normal limits. Possible fluid-filled bowel or loculated fluid collections in the left upper quadrant adjacent to the spleen. Right Kidney: Length: 11.5 cm. Echogenicity within normal limits. No mass or hydronephrosis visualized. Left Kidney: Length: 10.7 cm. Echogenicity within normal limits. No mass or hydronephrosis visualized. Abdominal aorta: No aneurysm visualized. Other findings: None. IMPRESSION: 1. Echogenic, coarse liver consistent with cirrhosis. 2. Multiple layering stones in the gallbladder. Negative sonographic Murphy's. Wall thickness is normal, however there is moderate to large amount of slightly complex fluid adjacent to the gallbladder. If acute cholecystitis is suspected clinically, correlation with HIDA scan could be obtained. 3. Fluid-filled bowel or loculated fluid collections in the left upper quadrant adjacent to the spleen. Electronically Signed   By: Donavan Foil M.D.   On: 06/09/2016 00:20   [place] Art/ven Flow Abd Pelv Doppler  Result Date: 06/09/2016 CLINICAL DATA:  Cirrhosis.  Acute hepatitis. EXAM: DUPLEX ULTRASOUND OF LIVER TECHNIQUE: Color and duplex Doppler ultrasound was performed to evaluate the hepatic in-flow and out-flow vessels. COMPARISON:  None. FINDINGS: Portal Vein Velocities Main:  23.9 cm/sec Right:  30.2 cm/sec Left:  30.2 cm/sec Hepatic Vein Velocities Right:  46.1 cm/sec Middle:  46.9 cm/sec Left:  31.8 cm/sec Hepatic Artery Velocity:  92.3 cm/sec Splenic Vein Velocity:  46.1 cm/sec Varices: None visualized. Ascites: Not visualized. The main portal vein is 1.2 cm in diameter. The spleen is small in size measuring 3.4 x 3.0 x 4.8 cm. There is normal hepatopetal blood flow in the portal veins with normal flow velocity waveforms. Hepatic artery demonstrates normal high resistance waveforms. The hepatic veins are patent. The IVC is patent. IMPRESSION: Normal hepatic Doppler. Normal hepatopetal blood flow in the portal veins without thrombus. Patent hepatic veins.  Electronically Signed   By: Jeb Levering M.D.   On: 06/09/2016 00:20       Assessment / Plan:   61 y/o female with alcoholism and history of cocaine use presented with nausea / vomiting / loose stools. Significant lactic acidosis on admission, concern for sepsis although no clear source of infection, on antibiotics. She had marked elevation in liver enzymes, more so that would be expected with alcoholic hepatitis / acute alcohol intoxication alone. Fortunately they are downtrending today and INR normal. Suspect shock liver / ischemia, perhaps related to cocaine use. Awaiting results of viral serologies. US shows no thrombus, however findings suggestive of cirrhosis along with thrombocytopenia make this quite possible. Gallstones noted but no ductal dilation and normal AP and bilirubin, choledocholithiasis seems unlikely. We discussed what cirrhosis is, associated risks, and suspect related to alcohol use (she admits to at least 40oz of beer daily for years). Overall improved at this time.  Recommend: - continue supportive care, trend LFTs and INR to ensure continued downtrend - await pending serologies - complete alcohol and cocaine abstinence - once through her acute course and following discharge, she can follow up with our office for long term management of suspected cirrhosis, and further workup - also due for screening colonoscopy as outpatient  We will sign off for now, please call with additional questions.  [place] Cellar, MD [facility] Pager [phone]

## 2016-06-10 LAB — BASIC METABOLIC PANEL
Anion gap: 8 (ref 5–15)
BUN: 8 mg/dL (ref 6–20)
CO2: 21 mmol/L — ABNORMAL LOW (ref 22–32)
CREATININE: 1.31 mg/dL — AB (ref 0.44–1.00)
Calcium: 7.2 mg/dL — ABNORMAL LOW (ref 8.9–10.3)
Chloride: 107 mmol/L (ref 101–111)
GFR calc Af Amer: 51 mL/min — ABNORMAL LOW (ref >60)
GFR, EST NON AFRICAN AMERICAN: 44 mL/min — AB (ref >60)
GLUCOSE: 89 mg/dL (ref 65–99)
Potassium: 3.1 mmol/L — ABNORMAL LOW (ref 3.5–5.1)
Sodium: 136 mmol/L (ref 135–145)

## 2016-06-10 LAB — CBC
HEMATOCRIT: 32.7 % — AB (ref 36.0–46.0)
Hemoglobin: 10.7 g/dL — ABNORMAL LOW (ref 12.0–15.0)
MCH: 32.2 pg (ref 26.0–34.0)
MCHC: 32.7 g/dL (ref 30.0–36.0)
MCV: 98.5 fL (ref 78.0–100.0)
PLATELETS: 103 10*3/uL — AB (ref 150–400)
RBC: 3.32 MIL/uL — ABNORMAL LOW (ref 3.87–5.11)
RDW: 13.3 % (ref 11.5–15.5)
WBC: 4.9 10*3/uL (ref 4.0–10.5)

## 2016-06-10 LAB — PROCALCITONIN: PROCALCITONIN: 2.21 ng/mL

## 2016-06-10 MED ORDER — POTASSIUM CHLORIDE CRYS ER 20 MEQ PO TBCR
20.0000 meq | EXTENDED_RELEASE_TABLET | Freq: Once | ORAL | Status: AC
  Administered 2016-06-10: 20 meq via ORAL
  Filled 2016-06-10: qty 1

## 2016-06-10 NOTE — Progress Notes (Addendum)
Patient ID: Christie Morrison, female   DOB: [birth year], 61 y.o.   MRN: [id number]  PROGRESS NOTE    CELINE DISHMAN  TKW:[id number] DOB: 1955/08/18 DOA: 06/08/2016  PCP: No primary care provider on file.   Brief Narrative:  61 y.o. female with medical history significant for alcohol and cocaine abuse. Patient previously hospitalized in 2012 with intractable nausea and vomiting associated to Mallory-Weiss tear and mild duodenitis found on EGD. Patient's son called EMS to the home today after finding his mother lying on the floor between the toilet and the bathtub. She had been drinking heavily and smoking crack all night. EMS arrived to the home and the patient was noted with agonal respiratory effort. Bag valve mask was utilized at which point patient became more alert and responsive. Patient was given IV fluids. Alcohol level was 387, ammonia 67, lipase 96, lactic acid 7.63, CK 306, normal troponin, magnesium was 1.3. Cr was WNL.Sodium was 146. Platelets were 146. C diff was negative. FOBT was positive. UDS was positive for cocaine, tylenol was less than 10. No ketones on UA and no evidence of UTI. CT head and cervical spine showed no acute intracranial changes. Abd US showed cirrhosis. Pt empirically started on vanco and zosyn as pt met sepsis criteria although no clear source of infection is evident.   Assessment & Plan:   Principal Problem: Sepsis / Lactic acidosis - Sepsis criteria met on admission with hypothermia, tachycardia, tachypnea, hypotension needing multiple IV fluid resuscitations, lactic acidosis of 7.63 (although this can be attributed to alcohol intoxication as well) - No clear source of infection  - Continue empiric vanco and zosyn  - Urine cx and blood cx negative  - Lactic acid has normalized  Active Problems: Acute alcohol intoxication (HCC) - Monitor for withdrawals - Alcohol in high 300 range on admission - Counseled on alcohol cessation; already with evidence of  cirrhosis on abd [place]  Melena / Alcoholic gastritis / Anemia of chronic disease  - FOBT positive on admission - No gross bleeding - Hgb down to 10.7 this am - Continue protonix - Appreciate GI following   Abnormal LFT's - Likely alcoholic hepatitis  - LFT's improving - AST 1275 --> 872; ALT 262 --> 242 - Normal hepatic doppler  - Follow up on acute hepatitis panel  - INR 1.10 - GI following   Cocaine abuse - Cocaine detected on UDS - Counseled on cessation of drug abuse   Hypokalemia - Likely from alcohol abuse  - Supplemented - Follow up BMP in am  Thrombocytopenia (HCC) - Due to bone marrow suppression from alcohol abuse - On SCD's for DVT prophylaxis - Platelets 128 --> 103   Acute hypernatremia - Likely dehydration - Sodium normalized with IV fluids   Acute kidney injury - Initial Cr WNL but then up to 1.56, possibly from vanco - Cr has now improved, 1.31 this am   DVT prophylaxis: SCD's bilaterally  Code Status: full code  Family Communication: no family at the bedside this am Disposition Plan: home once LFT's better, once final cx results are back    Consultants:   GI  Procedures:   None   Antimicrobials:   Zosyn 06/08/2016 -->  Vanco 06/08/2016 --> 06/10/2016    Subjective: No overnight events.   Objective: Vitals:   06/10/16 0300 06/10/16 0400 06/10/16 0600 06/10/16 0819  BP:  117/73 127/81 133/76  Pulse: 71 74 69 (!) 58  Resp: 19 16 15  (!) 6  Temp: 98.9  F (37.2 C)   98.4 F (36.9 C)  TempSrc: Oral   Oral  SpO2: 100% 100% 99% 100%  Weight:      Height:        Intake/Output Summary (Last 24 hours) at 06/10/16 0939 Last data filed at 06/10/16 0931  Gross per 24 hour  Intake             5583 ml  Output              400 ml  Net             5183 ml   Filed Weights   06/08/16 1158 06/08/16 1725  Weight: 56.7 kg (125 lb) 65.1 kg (143 lb 8.3 oz)    Examination:  General exam: Appears calm and comfortable, no distress     Respiratory system: No wheezing, no rhonchi  Cardiovascular system: S1 & S2 heard, Rate controlled  Gastrointestinal system: (+) BS, non tender  Central nervous system: No focal neurological deficits. Extremities: No swelling, palpable pulses  Skin: Skin is warm and dry  Psychiatry: Normal mood and behavior    Data Reviewed: I have personally reviewed following labs and imaging studies  CBC:  Recent Labs Lab 06/08/16 1047 06/08/16 1608 06/09/16 0338 06/10/16 0515  WBC 5.1 11.3* 8.5 4.9  NEUTROABS 2.6  --   --   --   HGB 13.6 12.5 11.5* 10.7*  HCT 41.2 38.2 35.0* 32.7*  MCV 98.8 98.7 99.2 98.5  PLT 146* 127* 128* 459*   Basic Metabolic Panel:  Recent Labs Lab 06/08/16 0948 06/08/16 1608 06/09/16 0338 06/10/16 0515  NA 146* 145 144 136  K 3.7 3.7 3.6 3.1*  CL 105 114* 112* 107  CO2 21* 17* 20* 21*  GLUCOSE 128* 118* 89 89  BUN 5* <5* 9 8  CREATININE 0.96 1.18* 1.56* 1.31*  CALCIUM 9.2 7.5* 7.5* 7.2*  MG  --  1.3* 1.8  --   PHOS  --  4.7*  --   --    GFR: Estimated Creatinine Clearance: 42 mL/min (by C-G formula based on SCr of 1.31 mg/dL (H)). Liver Function Tests:  Recent Labs Lab 06/08/16 0948 06/08/16 1608 06/09/16 0338  AST 658* 1,275* 872*  ALT 184* 262* 242*  ALKPHOS 85 88 72  BILITOT 0.6 0.8 0.9  PROT 7.3 6.1* 5.8*  ALBUMIN 4.0 3.3* 3.1*    Recent Labs Lab 06/08/16 0948  LIPASE 96*    Recent Labs Lab 06/08/16 0948 06/09/16 0338  AMMONIA 65* 47*   Coagulation Profile:  Recent Labs Lab 06/08/16 1046 06/08/16 2034 06/09/16 0338  INR 1.01 1.06 1.10   Cardiac Enzymes:  Recent Labs Lab 06/08/16 1046  CKTOTAL 306*   BNP (last 3 results) No results for input(s): PROBNP in the last 8760 hours. HbA1C: No results for input(s): HGBA1C in the last 72 hours. CBG: No results for input(s): GLUCAP in the last 168 hours. Lipid Profile: No results for input(s): CHOL, HDL, LDLCALC, TRIG, CHOLHDL, LDLDIRECT in the last 72  hours. Thyroid Function Tests:  Recent Labs  06/08/16 1608  TSH 1.680   Anemia Panel: No results for input(s): VITAMINB12, FOLATE, FERRITIN, TIBC, IRON, RETICCTPCT in the last 72 hours. Urine analysis:    Component Value Date/Time   COLORURINE YELLOW 06/08/2016 1335   APPEARANCEUR HAZY (A) 06/08/2016 1335   LABSPEC 1.008 06/08/2016 1335   PHURINE 7.0 06/08/2016 1335   GLUCOSEU 50 (A) 06/08/2016 1335   HGBUR MODERATE (A) 06/08/2016 1335  BILIRUBINUR NEGATIVE 06/08/2016 [place] 06/08/2016 1335   PROTEINUR 100 (A) 06/08/2016 1335   NITRITE NEGATIVE 06/08/2016 1335   LEUKOCYTESUR NEGATIVE 06/08/2016 1335   Sepsis Labs: @LABRCNTIP (procalcitonin:4,lacticidven:4)   ) Recent Results (from the past 240 hour(s))  Blood Culture (routine x 2)     Status: None (Preliminary result)   Collection Time: 06/08/16 10:35 AM  Result Value Ref Range Status   Specimen Description BLOOD RIGHT ANTECUBITAL  Final   Special Requests IN PEDIATRIC BOTTLE 3CC  Final   Culture NO GROWTH < 24 HOURS  Final   Report Status PENDING  Incomplete  Blood Culture (routine x 2)     Status: None (Preliminary result)   Collection Time: 06/08/16 10:40 AM  Result Value Ref Range Status   Specimen Description BLOOD RIGHT FOREARM  Final   Special Requests BOTTLES DRAWN AEROBIC ONLY 5CC  Final   Culture NO GROWTH < 24 HOURS  Final   Report Status PENDING  Incomplete  C difficile quick scan w PCR reflex     Status: None   Collection Time: 06/08/16 12:29 PM  Result Value Ref Range Status   C Diff antigen NEGATIVE NEGATIVE Final   C Diff toxin NEGATIVE NEGATIVE Final   C Diff interpretation No C. difficile detected.  Final  Urine culture     Status: None   Collection Time: 06/08/16  1:35 PM  Result Value Ref Range Status   Specimen Description URINE, CATHETERIZED  Final   Special Requests NONE  Final   Culture NO GROWTH  Final   Report Status 06/09/2016 FINAL  Final  MRSA PCR Screening      Status: None   Collection Time: 06/08/16  5:46 PM  Result Value Ref Range Status   MRSA by PCR NEGATIVE NEGATIVE Final    Comment:        The GeneXpert MRSA Assay (FDA approved for NASAL specimens only), is one component of a comprehensive MRSA colonization surveillance program. It is not intended to diagnose MRSA infection nor to guide or monitor treatment for MRSA infections.       Radiology Studies: Dg Chest 2 View Result Date: 06/08/2016 No active cardiopulmonary process.   Ct Head Wo Contrast Result Date: 06/08/2016 1. No acute intracranial or calvarial findings. 2. No evidence of acute cervical spine fracture, traumatic subluxation or static signs of instability. 3. 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   Ct Cervical Spine Wo Contrast Result Date: 06/08/2016  1. No acute intracranial or calvarial findings. 2. No evidence of acute cervical spine fracture, traumatic subluxation or static signs of instability. 3. 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  US Abdomen Complete Result Date: 06/09/2016 1. Echogenic, coarse liver consistent with cirrhosis. 2. Multiple layering stones in the gallbladder. Negative sonographic Murphy's. Wall thickness is normal, however there is moderate to large amount of slightly complex fluid adjacent to the gallbladder. If acute cholecystitis is suspected clinically, correlation with HIDA scan could be obtained. 3. Fluid-filled bowel or loculated fluid collections in the left upper quadrant adjacent to the spleen.   [place] Art/ven Flow Abd Pelv Doppler Result Date: 06/09/2016 Normal hepatic Doppler. Normal hepatopetal blood flow in the portal veins without thrombus. Patent hepatic veins.    Scheduled Meds: . pantoprazole (PROTONIX) IV  40 mg Intravenous Daily  . piperacillin-tazobactam (ZOSYN)  IV  3.375 g Intravenous Q8H  . sodium chloride flush  3 mL  Intravenous Q12H  . vancomycin  1,000 mg Intravenous Q24H   Continuous Infusions: . sodium chloride 150 mL/hr at 06/10/16 0350     LOS: 2 days    Time spent: 25 minutes  Greater than 50% of the time spent on counseling and coordinating the care.   Leisa Lenz, MD [facility] Pager [phone]  If 7PM-7AM, please contact night-coverage [email or web address] Password [facility] 06/10/2016, 9:39 AM

## 2016-06-10 NOTE — Plan of Care (Signed)
Problem: Education: Goal: Knowledge of [facility] Education information/materials will improve Outcome: Progressing Discussed plan of care and bedtime medications with teach back displayed. Discussed possible transfer to a med-surg/telemetry floor tomorrow as well.

## 2016-06-11 LAB — HEPATIC FUNCTION PANEL
ALBUMIN: 2.8 g/dL — AB (ref 3.5–5.0)
ALBUMIN: 2.8 g/dL — AB (ref 3.5–5.0)
ALK PHOS: 52 U/L (ref 38–126)
ALT: 255 U/L — ABNORMAL HIGH (ref 14–54)
ALT: 239 U/L — AB (ref 14–54)
AST: 318 U/L — AB (ref 15–41)
AST: 265 U/L — AB (ref 15–41)
Alkaline Phosphatase: 50 U/L (ref 38–126)
BILIRUBIN DIRECT: 0.2 mg/dL (ref 0.1–0.5)
BILIRUBIN TOTAL: 0.8 mg/dL (ref 0.3–1.2)
Bilirubin, Direct: 0.2 mg/dL (ref 0.1–0.5)
Indirect Bilirubin: 0.5 mg/dL (ref 0.3–0.9)
Indirect Bilirubin: 0.6 mg/dL (ref 0.3–0.9)
TOTAL PROTEIN: 5.1 g/dL — AB (ref 6.5–8.1)
Total Bilirubin: 0.7 mg/dL (ref 0.3–1.2)
Total Protein: 5.1 g/dL — ABNORMAL LOW (ref 6.5–8.1)

## 2016-06-11 LAB — BASIC METABOLIC PANEL
ANION GAP: 7 (ref 5–15)
BUN: 6 mg/dL (ref 6–20)
CALCIUM: 7.7 mg/dL — AB (ref 8.9–10.3)
CO2: 23 mmol/L (ref 22–32)
Chloride: 109 mmol/L (ref 101–111)
Creatinine, Ser: 1.26 mg/dL — ABNORMAL HIGH (ref 0.44–1.00)
GFR, EST AFRICAN AMERICAN: 53 mL/min — AB (ref >60)
GFR, EST NON AFRICAN AMERICAN: 46 mL/min — AB (ref >60)
Glucose, Bld: 107 mg/dL — ABNORMAL HIGH (ref 65–99)
Potassium: 3.6 mmol/L (ref 3.5–5.1)
SODIUM: 139 mmol/L (ref 135–145)

## 2016-06-11 LAB — CBC
HEMATOCRIT: 32.6 % — AB (ref 36.0–46.0)
HEMOGLOBIN: 10.9 g/dL — AB (ref 12.0–15.0)
MCH: 32.5 pg (ref 26.0–34.0)
MCHC: 33.4 g/dL (ref 30.0–36.0)
MCV: 97.3 fL (ref 78.0–100.0)
Platelets: 105 10*3/uL — ABNORMAL LOW (ref 150–400)
RBC: 3.35 MIL/uL — AB (ref 3.87–5.11)
RDW: 13.2 % (ref 11.5–15.5)
WBC: 5.3 10*3/uL (ref 4.0–10.5)

## 2016-06-11 LAB — H PYLORI, IGM, IGG, IGA AB
H Pylori IgG: 1.84 Index Value — ABNORMAL HIGH (ref 0.00–0.79)
H. Pylogi, Iga Abs: 26.9 units — ABNORMAL HIGH (ref 0.0–8.9)

## 2016-06-11 LAB — HEPATITIS C VRS RNA DETECT BY PCR-QUAL: HEPATITIS C VRS RNA BY PCR-QUAL: NEGATIVE

## 2016-06-11 LAB — PROCALCITONIN: Procalcitonin: 1.03 ng/mL

## 2016-06-11 MED ORDER — HYDRALAZINE HCL 20 MG/ML IJ SOLN
10.0000 mg | Freq: Once | INTRAMUSCULAR | Status: AC
  Administered 2016-06-11: 10 mg via INTRAVENOUS
  Filled 2016-06-11: qty 1

## 2016-06-11 NOTE — Progress Notes (Addendum)
Patient ID: Christie Morrison, female   DOB: [birth year], 61 y.o.   MRN: [id number]  PROGRESS NOTE    RACHYL WUEBKER  ENI:[id number] DOB: 1956/03/09 DOA: 06/08/2016  PCP: No primary care provider on file.   Brief Narrative:  61 y.o. female with medical history significant for alcohol and cocaine abuse. Patient previously hospitalized in 2012 with intractable nausea and vomiting associated to Mallory-Weiss tear and mild duodenitis found on EGD. Patient's son called EMS to the home today after finding his mother lying on the floor between the toilet and the bathtub. She had been drinking heavily and smoking crack all night. EMS arrived to the home and the patient was noted with agonal respiratory effort. Bag valve mask was utilized at which point patient became more alert and responsive. Patient was given IV fluids. Alcohol level was 387, ammonia 67, lipase 96, lactic acid 7.63, CK 306, normal troponin, magnesium was 1.3. Cr was WNL.Sodium was 146. Platelets were 146. C diff was negative. FOBT was positive. UDS was positive for cocaine, tylenol was less than 10. No ketones on UA and no evidence of UTI. CT head and cervical spine showed no acute intracranial changes. Abd US showed cirrhosis. Pt empirically started on vanco and zosyn as pt met sepsis criteria although no clear source of infection is evident.   Assessment & Plan:   Principal Problem: Sepsis / Lactic acidosis - Sepsis criteria met on admission with hypothermia, tachycardia, tachypnea, hypotension needing multiple IV fluid resuscitations, lactic acidosis of 7.63 (although this can be attributed to alcohol intoxication as well) - No clear source of infection  - Lactic acid has normalized - Urine cx with no growth - Blood cx with no growth  - Stop abx at this time as no clear evidence of infection, no fever, no elevated WBC count    Active Problems: Acute alcohol intoxication (HCC) - Monitor for withdrawals - Alcohol in high 300  range on admission - Counseled on alcohol cessation; already with evidence of cirrhosis on abd [place]  Melena / Alcoholic gastritis / Anemia of chronic disease  - FOBT positive on admission - No gross bleeding - Hgb stable at 10.9 - Continue protonix - GI signed off   Abnormal LFT's - Likely alcoholic hepatitis  - LFT's improving - AST 1275 --> 872 --> 265; ALT 262 --> 242 --> 239 - Normal hepatic doppler  - Negative hepatitis panel   Cocaine abuse - Cocaine detected on UDS - Counseled on cessation of drug abuse   Hypokalemia - Likely from alcohol abuse  - Supplemented and WNL  Thrombocytopenia (HCC) - Due to bone marrow suppression from alcohol abuse - On SCD's for DVT prophylaxis - Platelets 128 --> 103 --> 109   Acute hypernatremia - Likely dehydration - Sodium normalized with IV fluids   Acute kidney injury - Initial Cr WNL but then up to 1.56, possibly from vanco - Cr improved to 1.26   DVT prophylaxis: SCD's bilaterally  Code Status: full code  Family Communication: no family at the bedside this am Disposition Plan: transfer to telemetry today    Consultants:   GI  Procedures:   None   Antimicrobials:   Zosyn 06/08/2016 --> 06/11/2016  Vanco 06/08/2016 --> 06/10/2016    Subjective: No overnight events.   Objective: Vitals:   06/11/16 0400 06/11/16 0500 06/11/16 0600 06/11/16 0754  BP: 127/78  112/64 132/86  Pulse: 75  62 65  Resp: 12 13 19 19   Temp: 98.3 F (36.8  C)   98.4 F (36.9 C)  TempSrc: Oral   Oral  SpO2: 98%  99% 100%  Weight:      Height:        Intake/Output Summary (Last 24 hours) at 06/11/16 1302 Last data filed at 06/11/16 0941  Gross per 24 hour  Intake             4073 ml  Output              625 ml  Net             3448 ml   Filed Weights   06/08/16 1158 06/08/16 1725  Weight: 56.7 kg (125 lb) 65.1 kg (143 lb 8.3 oz)    Examination:  General exam: no distress   Respiratory system: No wheezing, bilateral air  entry  Cardiovascular system: S1 & S2 heard, Rate controlled  Gastrointestinal system: (+) BS, non tender and non distended  Central nervous system: Nonfocal  Extremities: No swelling, palpable pulses bilaterally  Skin: warm and dry  Psychiatry: Normal mood and behavior    Data Reviewed: I have personally reviewed following labs and imaging studies  CBC:  Recent Labs Lab 06/08/16 1047 06/08/16 1608 06/09/16 0338 06/10/16 0515 06/11/16 0431  WBC 5.1 11.3* 8.5 4.9 5.3  NEUTROABS 2.6  --   --   --   --   HGB 13.6 12.5 11.5* 10.7* 10.9*  HCT 41.2 38.2 35.0* 32.7* 32.6*  MCV 98.8 98.7 99.2 98.5 97.3  PLT 146* 127* 128* 103* 130*   Basic Metabolic Panel:  Recent Labs Lab 06/08/16 0948 06/08/16 1608 06/09/16 0338 06/10/16 0515 06/11/16 0431  NA 146* 145 144 136 139  K 3.7 3.7 3.6 3.1* 3.6  CL 105 114* 112* 107 109  CO2 21* 17* 20* 21* 23  GLUCOSE 128* 118* 89 89 107*  BUN 5* <5* 9 8 6   CREATININE 0.96 1.18* 1.56* 1.31* 1.26*  CALCIUM 9.2 7.5* 7.5* 7.2* 7.7*  MG  --  1.3* 1.8  --   --   PHOS  --  4.7*  --   --   --    GFR: Estimated Creatinine Clearance: 43.6 mL/min (by C-G formula based on SCr of 1.26 mg/dL (H)). Liver Function Tests:  Recent Labs Lab 06/08/16 0948 06/08/16 1608 06/08/16 2034 06/09/16 0338 06/11/16 0431 06/11/16 0944  AST 658* 1,275*  --  872* 318* 265*  ALT 184* 262* BLOOD 242* 255* 239*  ALKPHOS 85 88  --  72 50 52  BILITOT 0.6 0.8  --  0.9 0.7 0.8  PROT 7.3 6.1*  --  5.8* 5.1* 5.1*  ALBUMIN 4.0 3.3*  --  3.1* 2.8* 2.8*    Recent Labs Lab 06/08/16 0948  LIPASE 96*    Recent Labs Lab 06/08/16 0948 06/09/16 0338  AMMONIA 65* 47*   Coagulation Profile:  Recent Labs Lab 06/08/16 1046 06/08/16 2034 06/09/16 0338  INR 1.01 1.06 1.10   Cardiac Enzymes:  Recent Labs Lab 06/08/16 1046  CKTOTAL 306*   BNP (last 3 results) No results for input(s): PROBNP in the last 8760 hours. HbA1C: No results for input(s): HGBA1C in  the last 72 hours. CBG: No results for input(s): GLUCAP in the last 168 hours. Lipid Profile: No results for input(s): CHOL, HDL, LDLCALC, TRIG, CHOLHDL, LDLDIRECT in the last 72 hours. Thyroid Function Tests:  Recent Labs  06/08/16 1608  TSH 1.680   Anemia Panel: No results for input(s): VITAMINB12, FOLATE, FERRITIN, TIBC,  IRON, RETICCTPCT in the last 72 hours. Urine analysis:    Component Value Date/Time   COLORURINE YELLOW 06/08/2016 1335   APPEARANCEUR HAZY (A) 06/08/2016 1335   LABSPEC 1.008 06/08/2016 1335   PHURINE 7.0 06/08/2016 1335   GLUCOSEU 50 (A) 06/08/2016 1335   HGBUR MODERATE (A) 06/08/2016 1335   BILIRUBINUR NEGATIVE 06/08/2016 1335   KETONESUR NEGATIVE 06/08/2016 1335   PROTEINUR 100 (A) 06/08/2016 1335   NITRITE NEGATIVE 06/08/2016 1335   LEUKOCYTESUR NEGATIVE 06/08/2016 1335   Sepsis Labs: @LABRCNTIP (procalcitonin:4,lacticidven:4)    Recent Results (from the past 240 hour(s))  Blood Culture (routine x 2)     Status: None (Preliminary result)   Collection Time: 06/08/16 10:35 AM  Result Value Ref Range Status   Specimen Description BLOOD RIGHT ANTECUBITAL  Final   Special Requests IN PEDIATRIC BOTTLE 3CC  Final   Culture NO GROWTH 2 DAYS  Final   Report Status PENDING  Incomplete  Blood Culture (routine x 2)     Status: None (Preliminary result)   Collection Time: 06/08/16 10:40 AM  Result Value Ref Range Status   Specimen Description BLOOD RIGHT FOREARM  Final   Special Requests BOTTLES DRAWN AEROBIC ONLY 5CC  Final   Culture NO GROWTH 2 DAYS  Final   Report Status PENDING  Incomplete  C difficile quick scan w PCR reflex     Status: None   Collection Time: 06/08/16 12:29 PM  Result Value Ref Range Status   C Diff antigen NEGATIVE NEGATIVE Final   C Diff toxin NEGATIVE NEGATIVE Final   C Diff interpretation No C. difficile detected.  Final  Urine culture     Status: None   Collection Time: 06/08/16  1:35 PM  Result Value Ref Range Status    Specimen Description URINE, CATHETERIZED  Final   Special Requests NONE  Final   Culture NO GROWTH  Final   Report Status 06/09/2016 FINAL  Final  MRSA PCR Screening     Status: None   Collection Time: 06/08/16  5:46 PM  Result Value Ref Range Status   MRSA by PCR NEGATIVE NEGATIVE Final    Comment:        The GeneXpert MRSA Assay (FDA approved for NASAL specimens only), is one component of a comprehensive MRSA colonization surveillance program. It is not intended to diagnose MRSA infection nor to guide or monitor treatment for MRSA infections.       Radiology Studies: Dg Chest 2 View Result Date: 06/08/2016 No active cardiopulmonary process.   Ct Head Wo Contrast Result Date: 06/08/2016 1. No acute intracranial or calvarial findings. 2. No evidence of acute cervical spine fracture, traumatic subluxation or static signs of instability. 3. 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   Ct Cervical Spine Wo Contrast Result Date: 06/08/2016  1. No acute intracranial or calvarial findings. 2. No evidence of acute cervical spine fracture, traumatic subluxation or static signs of instability. 3. 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  US Abdomen Complete Result Date: 06/09/2016 1. Echogenic, coarse liver consistent with cirrhosis. 2. Multiple layering stones in the gallbladder. Negative sonographic Murphy's. Wall thickness is normal, however there is moderate to large amount of slightly complex fluid adjacent to the gallbladder. If acute cholecystitis is suspected clinically, correlation with HIDA scan could be obtained. 3. Fluid-filled bowel or loculated fluid collections in the left upper quadrant adjacent to the spleen.   [place] Art/ven  Flow Abd Pelv Doppler Result Date: 06/09/2016 Normal hepatic Doppler. Normal hepatopetal blood flow in the portal veins without thrombus. Patent hepatic veins.     Scheduled Meds: . pantoprazole (PROTONIX) IV  40 mg Intravenous Daily  . piperacillin-tazobactam (ZOSYN)  IV  3.375 g Intravenous Q8H  . sodium chloride flush  3 mL Intravenous Q12H   Continuous Infusions: . sodium chloride 150 mL/hr at 06/11/16 0941     LOS: 3 days    Time spent: 25 minutes  Greater than 50% of the time spent on counseling and coordinating the care.   Leisa Lenz, MD [facility] Pager [phone]  If 7PM-7AM, please contact night-coverage [email or web address] Password [email or web address] 06/11/2016, 1:02 PM

## 2016-06-12 LAB — BASIC METABOLIC PANEL
Anion gap: 8 (ref 5–15)
BUN: 5 mg/dL — AB (ref 6–20)
CHLORIDE: 109 mmol/L (ref 101–111)
CO2: 22 mmol/L (ref 22–32)
CREATININE: 0.98 mg/dL (ref 0.44–1.00)
Calcium: 8 mg/dL — ABNORMAL LOW (ref 8.9–10.3)
GFR calc Af Amer: >60 mL/min (ref >60)
GFR calc non Af Amer: >60 mL/min (ref >60)
GLUCOSE: 110 mg/dL — AB (ref 65–99)
Potassium: 3 mmol/L — ABNORMAL LOW (ref 3.5–5.1)
SODIUM: 139 mmol/L (ref 135–145)

## 2016-06-12 LAB — CBC
HCT: 31.6 % — ABNORMAL LOW (ref 36.0–46.0)
HEMOGLOBIN: 10.6 g/dL — AB (ref 12.0–15.0)
MCH: 32.4 pg (ref 26.0–34.0)
MCHC: 33.5 g/dL (ref 30.0–36.0)
MCV: 96.6 fL (ref 78.0–100.0)
Platelets: 115 10*3/uL — ABNORMAL LOW (ref 150–400)
RBC: 3.27 MIL/uL — ABNORMAL LOW (ref 3.87–5.11)
RDW: 13 % (ref 11.5–15.5)
WBC: 5.7 10*3/uL (ref 4.0–10.5)

## 2016-06-12 MED ORDER — PANTOPRAZOLE SODIUM 40 MG PO TBEC
40.0000 mg | DELAYED_RELEASE_TABLET | Freq: Every day | ORAL | Status: DC
  Administered 2016-06-13: 40 mg via ORAL
  Filled 2016-06-12: qty 1

## 2016-06-12 MED ORDER — HYDRALAZINE HCL 20 MG/ML IJ SOLN: 5.0000 mg | Freq: Four times a day (QID) | INTRAMUSCULAR | Status: DC | PRN

## 2016-06-12 MED ORDER — SODIUM CHLORIDE 0.9 % IV SOLN
30.0000 meq | Freq: Once | INTRAVENOUS | Status: AC
  Administered 2016-06-12: 30 meq via INTRAVENOUS
  Filled 2016-06-12: qty 15

## 2016-06-12 MED ORDER — ZOLPIDEM TARTRATE 5 MG PO TABS
5.0000 mg | ORAL_TABLET | Freq: Once | ORAL | Status: AC
  Administered 2016-06-12: 5 mg via ORAL
  Filled 2016-06-12: qty 1

## 2016-06-12 MED ORDER — AMLODIPINE BESYLATE 5 MG PO TABS
5.0000 mg | ORAL_TABLET | Freq: Every day | ORAL | Status: DC
  Administered 2016-06-12 – 2016-06-13 (×2): 5 mg via ORAL
  Filled 2016-06-12 (×2): qty 1

## 2016-06-12 NOTE — Progress Notes (Addendum)
Patient ID: Christie Morrison, female   DOB: 17-Nov-1955, 61 y.o.   MRN: [id number]  PROGRESS NOTE    Christie Morrison  FFM:[id number] DOB: 04-24-1956 DOA: 06/08/2016  PCP: No primary care provider on file.   Brief Narrative:  61 y.o. female with medical history significant for alcohol and cocaine abuse. Patient previously hospitalized in 2012 with intractable nausea and vomiting associated to Mallory-Weiss tear and mild duodenitis found on EGD. Patient's son called EMS to the home today after finding his mother lying on the floor between the toilet and the bathtub. She had been drinking heavily and smoking crack all night. EMS arrived to the home and the patient was noted with agonal respiratory effort. Bag valve mask was utilized at which point patient became more alert and responsive. Patient was given IV fluids. Alcohol level was 387, ammonia 67, lipase 96, lactic acid 7.63, CK 306, normal troponin, magnesium was 1.3. Cr was WNL.Sodium was 146. Platelets were 146. C diff was negative. FOBT was positive. UDS was positive for cocaine, tylenol was less than 10. No ketones on UA and no evidence of UTI. CT head and cervical spine showed no acute intracranial changes. Abd US showed cirrhosis. Pt empirically started on vanco and zosyn as pt met sepsis criteria although no clear source of infection is evident.   Assessment & Plan:   Principal Problem: Sepsis / Lactic acidosis - Sepsis criteria met on admission with hypothermia, tachycardia, tachypnea, hypotension needing multiple IV fluid resuscitations, lactic acidosis of 7.63 (although this can be attributed to alcohol intoxication as well) - No clear source of infection so abx stopped 1/22; no fevers and WBC count WNL - Lactic acid has normalized - Urine cx with no growth - Blood cx with no growth    Active Problems: Acute alcohol intoxication (HCC) - No reports of withdrawals  - Alcohol in high 300 range on admission - Counseled on alcohol  cessation; already with evidence of cirrhosis on abd [place]  Melena / Alcoholic gastritis / Anemia of chronic disease / IgG H.Pylori positive - FOBT positive on admission - No gross bleeding - Hgb stable at 10.6 - Continue protonix - Confirmed with GI that IgG H.pylori positive result would not required treatment; IgM was negative  - GI signed off   Abnormal LFT's - Likely alcoholic hepatitis  - LFT's improving - AST 1275 --> 872 --> 265; ALT 262 --> 242 --> 239 - Check LFT's in am - Normal hepatic doppler  - Negative hepatitis panel   Cocaine abuse - Cocaine detected on UDS - Counseled on cessation of drug abuse   Hypokalemia - Likely from alcohol abuse  - Supplemented  - Follow up BMP in am  Thrombocytopenia (HCC) - Due to bone marrow suppression from alcohol abuse - On SCD's for DVT prophylaxis - Platelets stable, 115 this am    Acute hypernatremia - Likely dehydration - Sodium normalized with IV fluids   Acute kidney injury - Initial Cr WNL but then up to 1.56, possibly from vanco - She is no longer on vanco - Cr WNL this am  Essential hypertension - Started Norvasc 5 mg daily    DVT prophylaxis: SCD's bilaterally  Code Status: full code  Family Communication: no family at the bedside this am Disposition Plan: home in am if LFT's better    Consultants:   GI  Procedures:   None   Antimicrobials:   Zosyn 06/08/2016 --> 06/11/2016  Vanco 06/08/2016 --> 06/10/2016  Subjective: No overnight events.   Objective: Vitals:   06/11/16 2155 06/12/16 0611 06/12/16 1121 06/12/16 1243  BP: (!) 172/107 (!) 155/86 (!) 137/91 (!) 154/91  Pulse: 66 91 80 70  Resp: 16 18  19   Temp: 98.1 F (36.7 C) 98.3 F (36.8 C)  98.5 F (36.9 C)  TempSrc: Oral Oral  Oral  SpO2: 100% 100%  100%  Weight: 68.6 kg (151 lb 4.8 oz)     Height: 5' 9"  (1.753 m)       Intake/Output Summary (Last 24 hours) at 06/12/16 1628 Last data filed at 06/12/16 1244  Gross per 24 hour   Intake           4827.5 ml  Output              900 ml  Net           3927.5 ml   Filed Weights   06/08/16 1158 06/08/16 1725 06/11/16 2155  Weight: 56.7 kg (125 lb) 65.1 kg (143 lb 8.3 oz) 68.6 kg (151 lb 4.8 oz)    Examination:  General exam: no distress, calm and comfortable  Respiratory system: No wheezing, bilateral air entry  Cardiovascular system: S1 & S2 heard, RRR Gastrointestinal system: (+) BS, non tender and non distended  Central nervous system: No focal deficits  Extremities: No swelling, palpable pulses  Skin: no lesions or ulcers  Psychiatry: Normal mood, no agitation, no asterixis   Data Reviewed: I have personally reviewed following labs and imaging studies  CBC:  Recent Labs Lab 06/08/16 1047 06/08/16 1608 06/09/16 0338 06/10/16 0515 06/11/16 0431 06/12/16 0336  WBC 5.1 11.3* 8.5 4.9 5.3 5.7  NEUTROABS 2.6  --   --   --   --   --   HGB 13.6 12.5 11.5* 10.7* 10.9* 10.6*  HCT 41.2 38.2 35.0* 32.7* 32.6* 31.6*  MCV 98.8 98.7 99.2 98.5 97.3 96.6  PLT 146* 127* 128* 103* 105* 867*   Basic Metabolic Panel:  Recent Labs Lab 06/08/16 1608 06/09/16 0338 06/10/16 0515 06/11/16 0431 06/12/16 0336  NA 145 144 136 139 139  K 3.7 3.6 3.1* 3.6 3.0*  CL 114* 112* 107 109 109  CO2 17* 20* 21* 23 22  GLUCOSE 118* 89 89 107* 110*  BUN <5* 9 8 6  5*  CREATININE 1.18* 1.56* 1.31* 1.26* 0.98  CALCIUM 7.5* 7.5* 7.2* 7.7* 8.0*  MG 1.3* 1.8  --   --   --   PHOS 4.7*  --   --   --   --    GFR: Estimated Creatinine Clearance: 64.6 mL/min (by C-G formula based on SCr of 0.98 mg/dL). Liver Function Tests:  Recent Labs Lab 06/08/16 0948 06/08/16 1608 06/08/16 2034 06/09/16 0338 06/11/16 0431 06/11/16 0944  AST 658* 1,275*  --  872* 318* 265*  ALT 184* 262* BLOOD 242* 255* 239*  ALKPHOS 85 88  --  72 50 52  BILITOT 0.6 0.8  --  0.9 0.7 0.8  PROT 7.3 6.1*  --  5.8* 5.1* 5.1*  ALBUMIN 4.0 3.3*  --  3.1* 2.8* 2.8*    Recent Labs Lab 06/08/16 0948    LIPASE 96*    Recent Labs Lab 06/08/16 0948 06/09/16 0338  AMMONIA 65* 47*   Coagulation Profile:  Recent Labs Lab 06/08/16 1046 06/08/16 2034 06/09/16 0338  INR 1.01 1.06 1.10   Cardiac Enzymes:  Recent Labs Lab 06/08/16 1046  CKTOTAL 306*   BNP (last 3 results)  No results for input(s): PROBNP in the last 8760 hours. HbA1C: No results for input(s): HGBA1C in the last 72 hours. CBG: No results for input(s): GLUCAP in the last 168 hours. Lipid Profile: No results for input(s): CHOL, HDL, LDLCALC, TRIG, CHOLHDL, LDLDIRECT in the last 72 hours. Thyroid Function Tests: No results for input(s): TSH, T4TOTAL, FREET4, T3FREE, THYROIDAB in the last 72 hours. Anemia Panel: No results for input(s): VITAMINB12, FOLATE, FERRITIN, TIBC, IRON, RETICCTPCT in the last 72 hours. Urine analysis:    Component Value Date/Time   COLORURINE YELLOW 06/08/2016 1335   APPEARANCEUR HAZY (A) 06/08/2016 1335   LABSPEC 1.008 06/08/2016 1335   PHURINE 7.0 06/08/2016 1335   GLUCOSEU 50 (A) 06/08/2016 1335   HGBUR MODERATE (A) 06/08/2016 1335   BILIRUBINUR NEGATIVE 06/08/2016 1335   KETONESUR NEGATIVE 06/08/2016 1335   PROTEINUR 100 (A) 06/08/2016 1335   NITRITE NEGATIVE 06/08/2016 1335   LEUKOCYTESUR NEGATIVE 06/08/2016 1335   Sepsis Labs: @[email or web address] (procalcitonin:4,lacticidven:4)    Recent Results (from the past 240 hour(s))  Blood Culture (routine x 2)     Status: None (Preliminary result)   Collection Time: 06/08/16 10:35 AM  Result Value Ref Range Status   Specimen Description BLOOD RIGHT ANTECUBITAL  Final   Special Requests IN PEDIATRIC BOTTLE 3CC  Final   Culture NO GROWTH 4 DAYS  Final   Report Status PENDING  Incomplete  Blood Culture (routine x 2)     Status: None (Preliminary result)   Collection Time: 06/08/16 10:40 AM  Result Value Ref Range Status   Specimen Description BLOOD RIGHT FOREARM  Final   Special Requests BOTTLES DRAWN AEROBIC ONLY 5CC  Final   Culture  NO GROWTH 4 DAYS  Final   Report Status PENDING  Incomplete  C difficile quick scan w PCR reflex     Status: None   Collection Time: 06/08/16 12:29 PM  Result Value Ref Range Status   C Diff antigen NEGATIVE NEGATIVE Final   C Diff toxin NEGATIVE NEGATIVE Final   C Diff interpretation No C. difficile detected.  Final  Urine culture     Status: None   Collection Time: 06/08/16  1:35 PM  Result Value Ref Range Status   Specimen Description URINE, CATHETERIZED  Final   Special Requests NONE  Final   Culture NO GROWTH  Final   Report Status 06/09/2016 FINAL  Final  MRSA PCR Screening     Status: None   Collection Time: 06/08/16  5:46 PM  Result Value Ref Range Status   MRSA by PCR NEGATIVE NEGATIVE Final    Comment:        The GeneXpert MRSA Assay (FDA approved for NASAL specimens only), is one component of a comprehensive MRSA colonization surveillance program. It is not intended to diagnose MRSA infection nor to guide or monitor treatment for MRSA infections.       Radiology Studies: Dg Chest 2 View Result Date: 06/08/2016 No active cardiopulmonary process.   Ct Head Wo Contrast Result Date: 06/08/2016 1. No acute intracranial or calvarial findings. 2. No evidence of acute cervical spine fracture, traumatic subluxation or static signs of instability. 3. 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   Ct Cervical Spine Wo Contrast Result Date: 06/08/2016  1. No acute intracranial or calvarial findings. 2. No evidence of acute cervical spine fracture, traumatic subluxation or static signs of instability. 3. 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  US Abdomen Complete Result Date: 06/09/2016 1. Echogenic, coarse liver consistent with cirrhosis. 2. Multiple layering stones in the gallbladder. Negative sonographic Murphy's. Wall thickness is normal, however there is moderate to large  amount of slightly complex fluid adjacent to the gallbladder. If acute cholecystitis is suspected clinically, correlation with HIDA scan could be obtained. 3. Fluid-filled bowel or loculated fluid collections in the left upper quadrant adjacent to the spleen.   [place] Art/ven Flow Abd Pelv Doppler Result Date: 06/09/2016 Normal hepatic Doppler. Normal hepatopetal blood flow in the portal veins without thrombus. Patent hepatic veins.    Scheduled Meds: . [START ON 06/13/2016] pantoprazole  40 mg Oral Daily  . potassium chloride (KCL MULTIRUN) 30 mEq in 265 mL IVPB  30 mEq Intravenous Once  . sodium chloride flush  3 mL Intravenous Q12H   Continuous Infusions:    LOS: 4 days    Time spent: 15 minutes  Greater than 50% of the time spent on counseling and coordinating the care.   Leisa Lenz, MD [facility] Pager [phone]  If 7PM-7AM, please contact night-coverage [email or web address] Password [email or web address] 06/12/2016, 4:28 PM

## 2016-06-13 DIAGNOSIS — K7030 Alcoholic cirrhosis of liver without ascites: Secondary | ICD-10-CM

## 2016-06-13 LAB — COMPREHENSIVE METABOLIC PANEL
ALK PHOS: 53 U/L (ref 38–126)
ALT: 127 U/L — AB (ref 14–54)
AST: 62 U/L — ABNORMAL HIGH (ref 15–41)
Albumin: 2.8 g/dL — ABNORMAL LOW (ref 3.5–5.0)
Anion gap: 9 (ref 5–15)
BILIRUBIN TOTAL: 0.7 mg/dL (ref 0.3–1.2)
CALCIUM: 8.2 mg/dL — AB (ref 8.9–10.3)
CO2: 25 mmol/L (ref 22–32)
Chloride: 107 mmol/L (ref 101–111)
Creatinine, Ser: 0.97 mg/dL (ref 0.44–1.00)
GFR calc Af Amer: >60 mL/min (ref >60)
GFR calc non Af Amer: >60 mL/min (ref >60)
Glucose, Bld: 107 mg/dL — ABNORMAL HIGH (ref 65–99)
Potassium: 3.1 mmol/L — ABNORMAL LOW (ref 3.5–5.1)
Sodium: 141 mmol/L (ref 135–145)
TOTAL PROTEIN: 5.2 g/dL — AB (ref 6.5–8.1)

## 2016-06-13 LAB — CBC
HCT: 31.3 % — ABNORMAL LOW (ref 36.0–46.0)
Hemoglobin: 10.5 g/dL — ABNORMAL LOW (ref 12.0–15.0)
MCH: 32.5 pg (ref 26.0–34.0)
MCHC: 33.5 g/dL (ref 30.0–36.0)
MCV: 96.9 fL (ref 78.0–100.0)
PLATELETS: 118 10*3/uL — AB (ref 150–400)
RBC: 3.23 MIL/uL — AB (ref 3.87–5.11)
RDW: 13.2 % (ref 11.5–15.5)
WBC: 5.3 10*3/uL (ref 4.0–10.5)

## 2016-06-13 LAB — CULTURE, BLOOD (ROUTINE X 2)
CULTURE: NO GROWTH
CULTURE: NO GROWTH

## 2016-06-13 MED ORDER — AMLODIPINE BESYLATE 5 MG PO TABS: 5.0000 mg | ORAL_TABLET | Freq: Every day | ORAL | 0 refills | Status: DC

## 2016-06-13 MED ORDER — PANTOPRAZOLE SODIUM 40 MG PO TBEC: 40.0000 mg | DELAYED_RELEASE_TABLET | Freq: Every day | ORAL | 0 refills | Status: DC

## 2016-06-13 MED ORDER — ZOLPIDEM TARTRATE 5 MG PO TABS: 5.0000 mg | ORAL_TABLET | Freq: Once | ORAL | Status: DC

## 2016-06-13 MED ORDER — POTASSIUM CHLORIDE CRYS ER 20 MEQ PO TBCR: 40.0000 meq | EXTENDED_RELEASE_TABLET | Freq: Once | ORAL | Status: DC

## 2016-06-13 NOTE — Progress Notes (Signed)
Lethea Killings to be D/C'd Home per MD order. Discussed with the patient and all questions fully answered.  Allergies as of 06/13/2016   No Known Allergies     Medication List    TAKE these medications   amLODipine 5 MG tablet Commonly known as:  NORVASC Take 1 tablet (5 mg total) by mouth daily. Start taking on:  06/14/2016   pantoprazole 40 MG tablet Commonly known as:  PROTONIX Take 1 tablet (40 mg total) by mouth daily. Start taking on:  06/14/2016       VVS, Skin clean, dry and intact without evidence of skin break down, no evidence of skin tears noted.  IV catheter discontinued intact. Site without signs and symptoms of complications. Dressing and pressure applied.  An After Visit Summary was printed and given to the patient.   Cyndra Numbers  06/13/2016 3:17 PM

## 2016-06-13 NOTE — Discharge Summary (Signed)
Physician Discharge Summary  Christie Morrison LKH:[id number] DOB: [birth year] DOA: 06/08/2016  PCP: No primary care provider on file.  Admit date: 06/08/2016 Discharge date: 06/13/2016  Recommendations for Outpatient Follow-up:  1. Continue Norvasc on discharge for blood pressure control   Discharge Diagnoses:  Principal Problem:   Dehydration, severe Active Problems:   Elevated lactic acid level   Alcohol abuse   Melena   Cocaine abuse   Thrombocytopenia (HCC)   Acute hypernatremia   Acute alcohol intoxication ([place])   Acute hepatitis    Discharge Condition: stable   Diet recommendation: as tolerated   History of present illness:  61 y.o.femalewith medical history significant for alcohol and cocaine abuse. Patient previously hospitalized in 2012 with intractable nausea and vomiting associated to Mallory-Weiss tear and mild duodenitis found on EGD. Patient's son called EMS to the home today after finding his mother lying on the floor between the toilet and the bathtub. She had been drinking heavily and smoking crack all night. EMS arrived to the home and the patient was noted with agonal respiratory effort. Bag valve mask was utilized at which point patient became more alert and responsive. Patient was given IV fluids. Alcohol level was 387, ammonia 67, lipase 96, lactic acid 7.63, CK 306, normal troponin, magnesium was 1.3. Cr was WNL.Sodium was 146. Platelets were 146. C diff was negative. FOBT was positive. UDS was positive for cocaine, tylenol was less than 10. No ketones on UA and no evidence of UTI. CT head and cervical spine showed no acute intracranial changes. Abd US showed cirrhosis. Pt empirically started on vanco and zosyn as pt met sepsis criteria although no clear source of infection is evident.   Hospital Course:   Principal Problem: Sepsis / Lactic acidosis - Sepsis criteria met on admission with hypothermia, tachycardia, tachypnea, hypotension needing multiple IV  fluid resuscitations, lactic acidosis of 7.63 (although this can be attributed to alcohol intoxication as well) - No clear source of infection so abx stopped 1/22; no fevers and WBC count WNL - Lactic acid has normalized - Urine cx with no growth - Blood cx with no growth    Active Problems: Acute alcohol intoxication (HCC) - No reports of withdrawals  - Alcohol in high 300 range on admission - Counseled on alcohol cessation; already with evidence of cirrhosis on abd [place]  Melena / Alcoholic gastritis / Anemia of chronic disease / IgG H.Pylori positive - FOBT positive on admission - No gross bleeding - Hgb stable  - Continue PPI therapy  - Confirmed with GI that IgG H.pylori positive result would not required treatment; IgM was negative  - GI signed off   Abnormal LFT's - Likely alcoholic hepatitis  - AST 1275 --> 872 --> 265; ALT 262 --> 242 --> 239 - AST on 1/24 62, ALT 127, ALP and bili WNL - Normal hepatic doppler  - Negative hepatitis panel  - Outpt GI follow up   Cocaine abuse - Cocaine detected on UDS - Counseled on cessation of drug abuse   Hypokalemia - Likely from alcohol abuse  - Supplemented   Thrombocytopenia ([place]) - Due to bone marrow suppression from alcohol abuse - On SCD's for DVT prophylaxis - Platelets stable   Acute hypernatremia - Likely dehydration - Sodium normalized with IV fluids   Acute kidney injury - Initial Cr WNL but then up to 1.56, possibly from vanco - She is no longer on vanco - Cr WNL   Essential hypertension - Continue Norvasc 5  mg daily    DVT prophylaxis: SCD's bilaterally  Code Status: full code  Family Communication: no family at the bedside this am    Consultants:   GI  Procedures:   None   Antimicrobials:   Zosyn 06/08/2016 --> 06/11/2016  Vanco 06/08/2016 --> 06/10/2016   Signed:  Leisa Lenz, MD  [facility] 06/13/2016, 2:05 PM  Pager #: [phone]  Time spent in minutes:  less than 30 minutes   Discharge Exam: Vitals:   06/12/16 2146 06/13/16 0510  BP: (!) 161/101 (!) 148/80  Pulse:  83  Resp:  18  Temp:  98.8 F (37.1 C)   Vitals:   06/12/16 1243 06/12/16 2032 06/12/16 2146 06/13/16 0510  BP: (!) 154/91 (!) 161/101 (!) 161/101 (!) 148/80  Pulse: 70 82  83  Resp: _0 Temp: 98.5 F (36.9 C) 99.4 F (37.4 C)  98.8 F (37.1 C)  TempSrc: Oral Oral  Oral  SpO2: 100% 100%  100%  Weight:      Height:        General: Pt is alert, follows commands appropriately, not in acute distress Cardiovascular: Regular rate and rhythm, S1/S2 + Respiratory: Clear to auscultation bilaterally, no wheezing, no crackles, no rhonchi Abdominal: Soft, non tender, non distended, bowel sounds +, no guarding Extremities: no edema, no cyanosis, pulses palpable bilaterally DP and PT Neuro: Grossly nonfocal  Discharge Instructions  Discharge Instructions    Call MD for:  persistant nausea and vomiting    Complete by:  As directed    Call MD for:  redness, tenderness, or signs of infection (pain, swelling, redness, odor or green/yellow discharge around incision site)    Complete by:  As directed    Call MD for:  severe uncontrolled pain    Complete by:  As directed    Diet - low sodium heart healthy    Complete by:  As directed    Increase activity slowly    Complete by:  As directed      Allergies as of 06/13/2016   No Known Allergies     Medication List    TAKE these medications   amLODipine 5 MG tablet Commonly known as:  NORVASC Take 1 tablet (5 mg total) by mouth daily. Start taking on:  06/14/2016   pantoprazole 40 MG tablet Commonly known as:  PROTONIX Take 1 tablet (40 mg total) by mouth daily. Start taking on:  06/14/2016         The results of significant diagnostics from this hospitalization (including imaging, microbiology, ancillary and laboratory) are listed below for reference.    Significant Diagnostic Studies: X-ray Chest Pa  And Lateral  Result Date: 06/09/2016 CLINICAL DATA:  Elevated lactic acid level.  Chronic cough. EXAM: CHEST  2 VIEW COMPARISON:  Chest radiograph from one day prior. FINDINGS: Stable cardiomediastinal silhouette with normal heart size and aortic atherosclerosis. No pneumothorax. No pleural effusion. Lungs appear clear, with no acute consolidative airspace disease and no pulmonary edema. IMPRESSION: No active cardiopulmonary disease. Aortic atherosclerosis. Electronically Signed   By: Ilona Sorrel M.D.   On: 06/09/2016 09:14   Dg Chest 2 View  Result Date: 06/08/2016 CLINICAL DATA:  Found down. Unresponsive. History of substance abuse. EXAM: CHEST  2 VIEW COMPARISON:  07/11/2005 radiographs. FINDINGS: Suboptimal inspiration on the frontal examination. The heart size and mediastinal contours are normal. The lungs are clear. There is no pleural effusion or pneumothorax. No acute osseous findings are identified. IMPRESSION: No  active cardiopulmonary process. Electronically Signed   By: Richardean Sale M.D.   On: 06/08/2016 10:31   Ct Head Wo Contrast  Result Date: 06/08/2016 CLINICAL DATA:  Altered mental status. Found down. History of substance abuse. EXAM: CT HEAD WITHOUT CONTRAST CT CERVICAL SPINE WITHOUT CONTRAST TECHNIQUE: Multidetector CT imaging of the head and cervical spine was performed following the standard protocol without intravenous contrast. Multiplanar CT image reconstructions of the cervical spine were also generated. COMPARISON:  None. FINDINGS: CT HEAD FINDINGS Brain: There is no evidence of acute intracranial hemorrhage, mass lesion, brain edema or extra-axial fluid collection. There is mild generalized atrophy with prominence of the ventricles and subarachnoid spaces. There is no hydrocephalus. Mild periventricular white matter low-density likely reflects small vessel ischemic change. There is no CT evidence of acute cortical infarction. Vascular: Intracranial vascular calcifications are  present. Skull: Negative for fracture or focal lesion. Sinuses/Orbits: There is an old fracture of the lamina papyracea on the left with adjacent partial left ethmoid sinus opacification. The additional visualized paranasal sinuses, mastoid air cells and middle ears are clear. Other: None. CT CERVICAL SPINE FINDINGS Alignment: Straightening without focal angulation or listhesis. Skull base and vertebrae: No evidence of acute cervical spine fracture or traumatic subluxation. Soft tissues and spinal canal: No prevertebral fluid or swelling. No visible canal hematoma. There is asymmetric carotid atherosclerosis of the left. Disc levels: Disc height is relatively maintained throughout the cervical spine. There is probable bilateral facet ankylosis at C2-3 and on the left at C3-4. There is some ossification of the corresponding discs. There is prominent asymmetric right-sided facet hypertrophy at C5-6 and C6-7. No high-grade foraminal narrowing identified. Upper chest: The visualized upper chest appears unremarkable. Other: None. IMPRESSION: 1. No acute intracranial or calvarial findings. 2. No evidence of acute cervical spine fracture, traumatic subluxation or static signs of instability. 3. 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 Electronically Signed   By: Richardean Sale M.D.   On: 06/08/2016 12:04   Ct Cervical Spine Wo Contrast  Result Date: 06/08/2016 CLINICAL DATA:  Altered mental status. Found down. History of substance abuse. EXAM: CT HEAD WITHOUT CONTRAST CT CERVICAL SPINE WITHOUT CONTRAST TECHNIQUE: Multidetector CT imaging of the head and cervical spine was performed following the standard protocol without intravenous contrast. Multiplanar CT image reconstructions of the cervical spine were also generated. COMPARISON:  None. FINDINGS: CT HEAD FINDINGS Brain: There is no evidence of acute intracranial hemorrhage, mass lesion, brain edema or extra-axial  fluid collection. There is mild generalized atrophy with prominence of the ventricles and subarachnoid spaces. There is no hydrocephalus. Mild periventricular white matter low-density likely reflects small vessel ischemic change. There is no CT evidence of acute cortical infarction. Vascular: Intracranial vascular calcifications are present. Skull: Negative for fracture or focal lesion. Sinuses/Orbits: There is an old fracture of the lamina papyracea on the left with adjacent partial left ethmoid sinus opacification. The additional visualized paranasal sinuses, mastoid air cells and middle ears are clear. Other: None. CT CERVICAL SPINE FINDINGS Alignment: Straightening without focal angulation or listhesis. Skull base and vertebrae: No evidence of acute cervical spine fracture or traumatic subluxation. Soft tissues and spinal canal: No prevertebral fluid or swelling. No visible canal hematoma. There is asymmetric carotid atherosclerosis of the left. Disc levels: Disc height is relatively maintained throughout the cervical spine. There is probable bilateral facet ankylosis at C2-3 and on the left at C3-4. There is some ossification of the corresponding discs. There  is prominent asymmetric right-sided facet hypertrophy at C5-6 and C6-7. No high-grade foraminal narrowing identified. Upper chest: The visualized upper chest appears unremarkable. Other: None. IMPRESSION: 1. No acute intracranial or calvarial findings. 2. No evidence of acute cervical spine fracture, traumatic subluxation or static signs of instability. 3. 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 Electronically Signed   By: Richardean Sale M.D.   On: 06/08/2016 12:04   US Abdomen Complete  Result Date: 06/09/2016 CLINICAL DATA:  Cirrhosis EXAM: ABDOMEN ULTRASOUND COMPLETE COMPARISON:  04/07/2004 FINDINGS: Gallbladder: Multiple calcified layering stones in the gallbladder. Gallbladder wall thickness is 3  mm. Moderate to large complex fluid adjacent to the gallbladder, measuring 1.8 cm in thickness. Common bile duct: Diameter: Normal at 3.2 mm. Liver: Increased echogenicity.  No focal hepatic abnormality. IVC: No abnormality visualized. Pancreas: Visualized portion unremarkable. Spleen: Size and appearance within normal limits. Possible fluid-filled bowel or loculated fluid collections in the left upper quadrant adjacent to the spleen. Right Kidney: Length: 11.5 cm. Echogenicity within normal limits. No mass or hydronephrosis visualized. Left Kidney: Length: 10.7 cm. Echogenicity within normal limits. No mass or hydronephrosis visualized. Abdominal aorta: No aneurysm visualized. Other findings: None. IMPRESSION: 1. Echogenic, coarse liver consistent with cirrhosis. 2. Multiple layering stones in the gallbladder. Negative sonographic Murphy's. Wall thickness is normal, however there is moderate to large amount of slightly complex fluid adjacent to the gallbladder. If acute cholecystitis is suspected clinically, correlation with HIDA scan could be obtained. 3. Fluid-filled bowel or loculated fluid collections in the left upper quadrant adjacent to the spleen. Electronically Signed   By: Donavan Foil M.D.   On: 06/09/2016 00:20   [place] Art/ven Flow Abd Pelv Doppler  Result Date: 06/09/2016 CLINICAL DATA:  Cirrhosis.  Acute hepatitis. EXAM: DUPLEX ULTRASOUND OF LIVER TECHNIQUE: Color and duplex Doppler ultrasound was performed to evaluate the hepatic in-flow and out-flow vessels. COMPARISON:  None. FINDINGS: Portal Vein Velocities Main:  23.9 cm/sec Right:  30.2 cm/sec Left:  30.2 cm/sec Hepatic Vein Velocities Right:  46.1 cm/sec Middle:  46.9 cm/sec Left:  31.8 cm/sec Hepatic Artery Velocity:  92.3 cm/sec Splenic Vein Velocity:  46.1 cm/sec Varices: None visualized. Ascites: Not visualized. The main portal vein is 1.2 cm in diameter. The spleen is small in size measuring 3.4 x 3.0 x 4.8 cm. There is normal  hepatopetal blood flow in the portal veins with normal flow velocity waveforms. Hepatic artery demonstrates normal high resistance waveforms. The hepatic veins are patent. The IVC is patent. IMPRESSION: Normal hepatic Doppler. Normal hepatopetal blood flow in the portal veins without thrombus. Patent hepatic veins. Electronically Signed   By: Jeb Levering M.D.   On: 06/09/2016 00:20    Microbiology: Recent Results (from the past 240 hour(s))  Blood Culture (routine x 2)     Status: None (Preliminary result)   Collection Time: 06/08/16 10:35 AM  Result Value Ref Range Status   Specimen Description BLOOD RIGHT ANTECUBITAL  Final   Special Requests IN PEDIATRIC BOTTLE 3CC  Final   Culture NO GROWTH 4 DAYS  Final   Report Status PENDING  Incomplete  Blood Culture (routine x 2)     Status: None (Preliminary result)   Collection Time: 06/08/16 10:40 AM  Result Value Ref Range Status   Specimen Description BLOOD RIGHT FOREARM  Final   Special Requests BOTTLES DRAWN AEROBIC ONLY 5CC  Final   Culture NO GROWTH 4 DAYS  Final   Report Status PENDING  Incomplete  C difficile quick scan w PCR reflex     Status: None   Collection Time: 06/08/16 12:29 PM  Result Value Ref Range Status   C Diff antigen NEGATIVE NEGATIVE Final   C Diff toxin NEGATIVE NEGATIVE Final   C Diff interpretation No C. difficile detected.  Final  Urine culture     Status: None   Collection Time: 06/08/16  1:35 PM  Result Value Ref Range Status   Specimen Description URINE, CATHETERIZED  Final   Special Requests NONE  Final   Culture NO GROWTH  Final   Report Status 06/09/2016 FINAL  Final  MRSA PCR Screening     Status: None   Collection Time: 06/08/16  5:46 PM  Result Value Ref Range Status   MRSA by PCR NEGATIVE NEGATIVE Final    Comment:        The GeneXpert MRSA Assay (FDA approved for NASAL specimens only), is one component of a comprehensive MRSA colonization surveillance program. It is not intended to  diagnose MRSA infection nor to guide or monitor treatment for MRSA infections.      Labs: Basic Metabolic Panel:  Recent Labs Lab 06/08/16 1608 06/09/16 0338 06/10/16 0515 06/11/16 0431 06/12/16 0336 06/13/16 0242  NA 145 144 136 139 139 141  K 3.7 3.6 3.1* 3.6 3.0* 3.1*  CL 114* 112* 107 109 109 107  CO2 17* 20* 21* _0 GLUCOSE 118* 89 89 107* 110* 107*  BUN <5* _1 5* <5*  CREATININE 1.18* 1.56* 1.31* 1.26* 0.98 0.97  CALCIUM 7.5* 7.5* 7.2* 7.7* 8.0* 8.2*  MG 1.3* 1.8  --   --   --   --   PHOS 4.7*  --   --   --   --   --    Liver Function Tests:  Recent Labs Lab 06/08/16 1608 06/08/16 2034 06/09/16 0338 06/11/16 0431 06/11/16 0944 06/13/16 0242  AST 1,275*  --  872* 318* 265* 62*  ALT 262* BLOOD 242* 255* 239* 127*  ALKPHOS 88  --  72 50 52 53  BILITOT 0.8  --  0.9 0.7 0.8 0.7  PROT 6.1*  --  5.8* 5.1* 5.1* 5.2*  ALBUMIN 3.3*  --  3.1* 2.8* 2.8* 2.8*    Recent Labs Lab 06/08/16 0948  LIPASE 96*    Recent Labs Lab 06/08/16 0948 06/09/16 0338  AMMONIA 65* 47*   CBC:  Recent Labs Lab 06/08/16 1047  06/09/16 0338 06/10/16 0515 06/11/16 0431 06/12/16 0336 06/13/16 0242  WBC 5.1  < > 8.5 4.9 5.3 5.7 5.3  NEUTROABS 2.6  --   --   --   --   --   --   HGB 13.6  < > 11.5* 10.7* 10.9* 10.6* 10.5*  HCT 41.2  < > 35.0* 32.7* 32.6* 31.6* 31.3*  MCV 98.8  < > 99.2 98.5 97.3 96.6 96.9  PLT 146*  < > 128* 103* 105* 115* 118*  < > = values in this interval not displayed. Cardiac Enzymes:  Recent Labs Lab 06/08/16 1046  CKTOTAL 306*   BNP: BNP (last 3 results) No results for input(s): BNP in the last 8760 hours.  ProBNP (last 3 results) No results for input(s): PROBNP in the last 8760 hours.  CBG: No results for input(s): GLUCAP in the last 168 hours.

## 2016-06-13 NOTE — Discharge Instructions (Signed)
Liver Function Tests Liver function tests are blood tests to see how well your liver is working. The proteins and enzymes measured in the test can alert your health care provider to inflammation, damage, or disease in your liver. It is common to have liver function tests:  During annual physical exams.  When you are taking certain medicines.  If you have liver disease.  If you drink a lot of alcohol.  When you are not feeling well.  When you have other conditions that may affect the liver. Substances measured may include:  Alanine transaminase (ALT). This is an enzyme in the liver.  Aspartate transaminase (AST). This is an enzyme in the liver, heart, and muscles.  Alkaline phosphatase (ALP). This is a protein in the liver, bile ducts, and bone. It is also in other body tissues.  Total bilirubin. This is a yellow pigment in bile.  Albumin. This is a protein in the liver.  Prothrombin time and international normalized ratio (PT and INR). PT measures the time that it takes for your blood to clot. INR is a calculation of blood clotting time based upon your PT result. It is also calculated based on normal ranges defined by the laboratory that processed your lab test.  Total protein. This measures two proteins, albumin and globulin, found in the blood. How do I prepare for this test? How you prepare will depend on which tests are being done and the reason why these tests are being done. You may need to:  Avoid eating for 4-6 hours before the test or as directed by your health care provider.  Stop taking certain medicines prior to your blood test as directed by your health care provider. What do the results mean? It is your responsibility to obtain your test results. Ask the lab or department performing the test when and how you will get your results. Contact your health care provider to discuss any questions you have about your results. RANGE OF NORMAL VALUES  Ranges for normal values  may vary among different labs and hospitals. You should always check with your health care provider after having lab work or other tests done to discuss the meaning of your test results and whether your values are considered within normal limits. The following are normal ranges for substances measured in liver function tests: ALT   Infant: may be twice as high as adult values.  Child or adult: 4-36 international units/L at 37C or 4-36 units/L (SI units).  Elderly: may be slightly higher than adult values. AST   Newborn 0-5 days old: 35-140 units/L.  Child under 3 years old: 15-60 units/L.  3-6 years old: 15-50 units/L.  6-12 years old: 10-50 units/L.  12-18 years old: 10-40 units/L.  Adult: 0-35 units/L or 0-0.58 microkatal/L (SI units).  Elderly: slightly higher than adults. ALP   Child under 2 years old: 85-235 units/L.  2-8 years old: 65-210 units/L.  9-15 years old: 60-300 units/L.  16-21 years old: 30-200 units/L.    Elderly: slightly higher than adult. Total bilirubin   Newborn: 1.0-12.0 mg/dL or 17.1-205 micromoles/L (SI units).  Adult, elderly, or child: 0.3-1.0 mg/dL or 5.1-17 micromoles/L. Albumin   Premature infant: 3.0-4.2 g/dL.  Newborn: 3.5-5.4 g/dL.  Infant: 4.4-5.4 g/dL.  Child: 4.0-5.9 g/dL.  Adult or elderly: 3.5-5.0 g/dL or 35-50 g/L (SI units). PT   11.0-12.5 seconds; 85%-100%. INR   0.8-1.1. Total protein   Premature infant: 4.2-7.6 g/dL.  Newborn: 4.6-7.4 g/dL.  Infant: 6.0-6.7 g/dL.  Child:   6.2-8.0 g/dL.  Adult or elderly: 6.4-8.3 g/dL or 64-83 g/L (SI units). MEANING OF RESULTS OUTSIDE NORMAL VALUE RANGES  Sometimes test results can be abnormal due to other factors, such as medicines, exercise, or pregnancy. Follow up with your health care provider if you have any questions about test results outside the normal value ranges. ALT   Levels above the normal range, along with other test results, may indicate liver  disease. AST   Levels above the normal range, along with other test results, may indicate liver disease. Sometimes levels also increase after burns, surgery, heart attack, muscle damage, or seizure. ALP   Levels above the normal range, along with other test results, may indicate biliary obstruction, diseases of the liver, bone disease, thyroid disease, tumors, fractures, leukemia or lymphoma, or several other conditions. People with blood type O or B may show higher levels after a fatty meal.  Levels below the normal range, along with other test results, may indicate bone and teeth conditions, malnutrition, protein deficiency, or Wilson disease. Total bilirubin   Levels above the normal range, along with other test results, may indicate problems with the liver, gallbladder, or bile ducts. Albumin   Levels above the normal range, along with other test results, may indicate dehydration. They may also be caused by a diet that is high in protein. Sometimes, the band placed around the upper arm during the process of drawing blood can cause the level of this protein in your blood to rise and give you a result above the normal range.  Levels below the normal range, along with other tests results, may indicate kidney disease, liver disease, or malabsorption of nutrients. PT and INR   Levels above the normal range mean your blood is clotting slower than normal. This may be due to blood disorders, liver disorders, or low levels of vitamin K. Total protein   Levels above the normal range, along with other test results, may be due to infection or other diseases.  Levels below the normal range, along with other test results, may be due to an immune system disorder, bleeding, burns, kidney disorder, liver disease, trouble absorbing or getting enough nutrients, or other conditions that affect the intestines. Talk with your health care provider to discuss your results, treatment options, and if necessary,  the need for more tests. Talk with your health care provider if you have any questions about your results. This information is not intended to replace advice given to you by your health care provider. Make sure you discuss any questions you have with your health care provider. Document Released: 06/09/2004 Document Revised: 01/11/2016 Document Reviewed: 09/10/2013 Elsevier Interactive Patient Education  2017 [employer]  

## 2016-06-26 ENCOUNTER — Ambulatory Visit: Payer: Self-pay | Admitting: Physician Assistant

## 2016-07-10 ENCOUNTER — Ambulatory Visit: Payer: Self-pay | Admitting: Physician Assistant

## 2016-07-16 ENCOUNTER — Encounter: Payer: Self-pay | Admitting: *Deleted

## 2016-07-18 ENCOUNTER — Ambulatory Visit: Payer: Self-pay | Admitting: Physician Assistant

## 2017-07-26 ENCOUNTER — Encounter (HOSPITAL_COMMUNITY): Payer: Self-pay

## 2017-07-26 ENCOUNTER — Emergency Department (HOSPITAL_COMMUNITY): Payer: Self-pay

## 2017-07-26 ENCOUNTER — Other Ambulatory Visit: Payer: Self-pay

## 2017-07-26 ENCOUNTER — Emergency Department (HOSPITAL_COMMUNITY)
Admission: EM | Admit: 2017-07-26 | Discharge: 2017-07-26 | Disposition: A | Discharge status: Home / Self Care | Payer: Self-pay | Attending: Emergency Medicine | Admitting: Emergency Medicine

## 2017-07-26 DIAGNOSIS — R569 Unspecified convulsions: Secondary | ICD-10-CM

## 2017-07-26 DIAGNOSIS — F1090 Alcohol use, unspecified, uncomplicated: Secondary | ICD-10-CM

## 2017-07-26 DIAGNOSIS — E876 Hypokalemia: Secondary | ICD-10-CM

## 2017-07-26 DIAGNOSIS — Z79899 Other long term (current) drug therapy: Secondary | ICD-10-CM | POA: Insufficient documentation

## 2017-07-26 DIAGNOSIS — Z7289 Other problems related to lifestyle: Secondary | ICD-10-CM

## 2017-07-26 DIAGNOSIS — F1490 Cocaine use, unspecified, uncomplicated: Secondary | ICD-10-CM

## 2017-07-26 DIAGNOSIS — F1410 Cocaine abuse, uncomplicated: Secondary | ICD-10-CM | POA: Insufficient documentation

## 2017-07-26 DIAGNOSIS — Z789 Other specified health status: Secondary | ICD-10-CM

## 2017-07-26 DIAGNOSIS — F1010 Alcohol abuse, uncomplicated: Secondary | ICD-10-CM | POA: Insufficient documentation

## 2017-07-26 LAB — RAPID URINE DRUG SCREEN, HOSP PERFORMED
AMPHETAMINES: NOT DETECTED
BENZODIAZEPINES: NOT DETECTED
Barbiturates: NOT DETECTED
COCAINE: POSITIVE — AB
OPIATES: NOT DETECTED
TETRAHYDROCANNABINOL: NOT DETECTED

## 2017-07-26 LAB — ETHANOL: ALCOHOL ETHYL (B): 217 mg/dL — AB (ref <10)

## 2017-07-26 LAB — I-STAT TROPONIN, ED: Troponin i, poc: 0 ng/mL (ref 0.00–0.08)

## 2017-07-26 LAB — CBC WITH DIFFERENTIAL/PLATELET
BASOS PCT: 1 %
Basophils Absolute: 0 10*3/uL (ref 0.0–0.1)
EOS ABS: 0 10*3/uL (ref 0.0–0.7)
EOS PCT: 1 %
HCT: 37.7 % (ref 36.0–46.0)
HEMOGLOBIN: 12.9 g/dL (ref 12.0–15.0)
LYMPHS ABS: 1.2 10*3/uL (ref 0.7–4.0)
Lymphocytes Relative: 29 %
MCH: 33.9 pg (ref 26.0–34.0)
MCHC: 34.2 g/dL (ref 30.0–36.0)
MCV: 99.2 fL (ref 78.0–100.0)
Monocytes Absolute: 0.2 10*3/uL (ref 0.1–1.0)
Monocytes Relative: 5 %
Neutro Abs: 2.7 10*3/uL (ref 1.7–7.7)
Neutrophils Relative %: 64 %
PLATELETS: 147 10*3/uL — AB (ref 150–400)
RBC: 3.8 MIL/uL — AB (ref 3.87–5.11)
RDW: 14.6 % (ref 11.5–15.5)
WBC: 4.2 10*3/uL (ref 4.0–10.5)

## 2017-07-26 LAB — CBG MONITORING, ED: Glucose-Capillary: 82 mg/dL (ref 65–99)

## 2017-07-26 LAB — COMPREHENSIVE METABOLIC PANEL
ALBUMIN: 4.4 g/dL (ref 3.5–5.0)
ALK PHOS: 107 U/L (ref 38–126)
ALT: 31 U/L (ref 14–54)
ANION GAP: 22 — AB (ref 5–15)
AST: 129 U/L — ABNORMAL HIGH (ref 15–41)
BUN: 10 mg/dL (ref 6–20)
CHLORIDE: 98 mmol/L — AB (ref 101–111)
CO2: 24 mmol/L (ref 22–32)
Calcium: 8 mg/dL — ABNORMAL LOW (ref 8.9–10.3)
Creatinine, Ser: 1.26 mg/dL — ABNORMAL HIGH (ref 0.44–1.00)
GFR calc non Af Amer: 45 mL/min — ABNORMAL LOW (ref >60)
GFR, EST AFRICAN AMERICAN: 53 mL/min — AB (ref >60)
GLUCOSE: 91 mg/dL (ref 65–99)
Potassium: 2.9 mmol/L — ABNORMAL LOW (ref 3.5–5.1)
SODIUM: 144 mmol/L (ref 135–145)
Total Bilirubin: 0.8 mg/dL (ref 0.3–1.2)
Total Protein: 8 g/dL (ref 6.5–8.1)

## 2017-07-26 LAB — URINALYSIS, ROUTINE W REFLEX MICROSCOPIC
Bilirubin Urine: NEGATIVE
Glucose, UA: NEGATIVE mg/dL
Hgb urine dipstick: NEGATIVE
Ketones, ur: 5 mg/dL — AB
Leukocytes, UA: NEGATIVE
Nitrite: NEGATIVE
Protein, ur: NEGATIVE mg/dL
Specific Gravity, Urine: 1.012 (ref 1.005–1.030)
pH: 6 (ref 5.0–8.0)

## 2017-07-26 LAB — MAGNESIUM: Magnesium: 1.1 mg/dL — ABNORMAL LOW (ref 1.7–2.4)

## 2017-07-26 MED ORDER — MAGNESIUM SULFATE 2 GM/50ML IV SOLN
2.0000 g | Freq: Once | INTRAVENOUS | Status: AC
  Administered 2017-07-26: 2 g via INTRAVENOUS
  Filled 2017-07-26: qty 50

## 2017-07-26 MED ORDER — POTASSIUM CHLORIDE 10 MEQ/100ML IV SOLN
10.0000 meq | Freq: Once | INTRAVENOUS | Status: AC
  Administered 2017-07-26: 10 meq via INTRAVENOUS
  Filled 2017-07-26: qty 100

## 2017-07-26 MED ORDER — POTASSIUM CHLORIDE CRYS ER 20 MEQ PO TBCR
40.0000 meq | EXTENDED_RELEASE_TABLET | Freq: Once | ORAL | Status: AC
  Administered 2017-07-26: 40 meq via ORAL
  Filled 2017-07-26: qty 2

## 2017-07-26 MED ORDER — SODIUM CHLORIDE 0.9 % IV BOLUS (SEPSIS)
1000.0000 mL | Freq: Once | INTRAVENOUS | Status: AC
  Administered 2017-07-26: 1000 mL via INTRAVENOUS

## 2017-07-26 NOTE — ED Triage Notes (Signed)
Pt brought in by [facility] from work for a seizure witnessed by other staff members. Per EMS seizure lasted x30 seconds. Pt states she was standing when she began seizing, but did not fall down, instead was able to lower herself to the floor. Pt denies any pain, no obvious injuries noted. Pt is A+Ox4. Per EMS pt has no medical hx. Pt states she had a similar episode on Wednesday, states she "passed out". States she was not seen for this episode.

## 2017-07-26 NOTE — ED Notes (Signed)
Pt states her last drink was this morning, and last use of cocaine was yesterday.

## 2017-07-26 NOTE — ED Notes (Signed)
Patient verbalizes understanding of discharge instructions. Opportunity for questioning and answers were provided. Armband removed by staff, pt discharged from ED with bus pass to bus stop.

## 2017-07-26 NOTE — ED Notes (Signed)
Pt transported to CT ?

## 2017-07-26 NOTE — Discharge Instructions (Signed)
You had a witnessed seizure at work today.  It is very important that you do not drive until you are cleared by neurology you will need to call and schedule a follow-up appointment with them.  You also need to call and schedule follow-up appointment with the [facility] your potassium and magnesium were both low today and replaced here in the ED but will need to be rechecked.  Please reduce your alcohol intake and cocaine use as these place her health at risk.

## 2017-07-26 NOTE — ED Provider Notes (Signed)
[facility] EMERGENCY DEPARTMENT Provider Note   CSN: [id number] Arrival date & time: 07/26/17  1217     History   Chief Complaint Chief Complaint  Patient presents with  . Seizures    HPI Christie Morrison is a 62 y.o. female.  Christie Morrison is a 62 y.o. Female with history of alcohol abuse, cocaine use, hyperlipidemia, hepatitis and cirrhosis, who presents to the ED via EMS from work for a seizure witnessed by other staff members.  Per EMS seizure lasted approximately 30 seconds.  According to bystanders patient was standing but did not fall to the ground was able to lower herself to the floor when she began "seizing".  On initial evaluation of the patient she reports she is here because she "just passed out and she is fine".  Patient does report she became incontinent of urine during the event, no incontinence stool, denies biting her tongue.  Again reports she did not fall to the ground or injure her head, but reports she does not completely remember the event.  Patient reports similar event on Wednesday when she "passed out" reports she did not come to the hospital for evaluation and just went home and went to bed and was feeling better.  She denies any headache, vision changes, neck pain, dizziness or lightheadedness, no numbness, tingling or weakness in her extremities.  Patient denies chest pain, shortness of breath, abdominal pain, nausea, vomiting, no fevers or chills no cough or URI symptoms.  She reports daily alcohol use usually drinks a glass of straight vodka nightly, last drink was yesterday evening.  Patient also reports intermittent use of crack cocaine, last used yesterday.  She denies any other substance use.       Past Medical History:  Diagnosis Date  . Acute hepatitis   . Cirrhosis ([place])   . Crack cocaine use   . Diverticulosis   . Duodenitis   . ETOH abuse   . Gallstones   . High cholesterol   . Mallory-Weiss tear     Patient Active  Problem List   Diagnosis Date Noted  . Cirrhosis, alcoholic ([place])   . Acute hepatitis   . Dehydration, severe 06/08/2016  . Elevated lactic acid level 06/08/2016  . Alcohol abuse 06/08/2016  . Melena 06/08/2016  . Cocaine abuse ([place]) 06/08/2016  . Thrombocytopenia ([place]) 06/08/2016  . Acute hypernatremia 06/08/2016  . Acute alcohol intoxication ([place]) 06/08/2016    Past Surgical History:  Procedure Laterality Date  . CESAREAN SECTION      OB History    No data available       Home Medications    Prior to Admission medications   Medication Sig Start Date End Date Taking? Authorizing Provider  amLODipine (NORVASC) 5 MG tablet Take 1 tablet (5 mg total) by mouth daily. 06/14/16   Robbie Lis, MD  pantoprazole (PROTONIX) 40 MG tablet Take 1 tablet (40 mg total) by mouth daily. 06/14/16   Robbie Lis, MD    Family History Family History  Problem Relation Age of Onset  . Diabetes Mellitus II Mother   . Kidney failure Mother   . Alcohol abuse Mother   . Diabetes Mellitus II Brother   . Kidney failure Brother   . Alcohol abuse Maternal Grandmother     Social History Social History   Tobacco Use  . Smoking status: Never Smoker  . Smokeless tobacco: Never Used  Substance Use Topics  . Alcohol use: Yes  Comment: Pt states she drinks 1/2 gallon of vodka/week  . Drug use: Yes    Frequency: 1.0 times per week    Types: Cocaine    Comment: States uses cocaine x1 a week     Allergies   Patient has no known allergies.   Review of Systems Review of Systems  Constitutional: Negative for chills and fever.  HENT: Negative for congestion, rhinorrhea and sore throat.   Eyes: Negative for pain and visual disturbance.  Respiratory: Negative for cough and shortness of breath.   Cardiovascular: Negative for chest pain and leg swelling.  Gastrointestinal: Negative for abdominal pain, diarrhea, nausea and vomiting.  Genitourinary: Negative for dysuria.    Musculoskeletal: Negative for arthralgias.  Skin: Negative for color change, rash and wound.  Neurological: Positive for seizures. Negative for dizziness, tremors, facial asymmetry, speech difficulty, weakness, light-headedness, numbness and headaches.     Physical Exam Updated Vital Signs BP 129/85   Pulse 81   Temp 98.4 F (36.9 C) (Oral)   Resp 14   Ht 5\' 10"  (1.778 m)   Wt 72.6 kg (160 lb)   SpO2 100%   BMI 22.96 kg/m   Physical Exam  Constitutional: She is oriented to person, place, and time. She appears well-developed and well-nourished. No distress.  HENT:  Head: Normocephalic and atraumatic.  Mouth/Throat: Oropharynx is clear and moist.  Scalp nontender to palpation without evidence of hematoma, deformity or step-off.  No evidence of trauma to the tongue, mucous membranes appear a bit dry and oropharynx clear  Eyes: EOM are normal. Pupils are equal, round, and reactive to light. Right eye exhibits no discharge. Left eye exhibits no discharge.  No nystagmus  Neck: Normal range of motion. Neck supple.  No midline tenderness  Cardiovascular: Normal rate, regular rhythm and normal heart sounds.  Pulmonary/Chest: Effort normal and breath sounds normal. No stridor. No respiratory distress. She has no wheezes. She has no rales.  Respirations equal and unlabored, patient able to speak in full sentences, lungs clear to auscultation bilaterally  Abdominal: Soft. Bowel sounds are normal. She exhibits no distension and no mass. There is no tenderness. There is no guarding.  Musculoskeletal: She exhibits no edema or deformity.  Neurological: She is alert and oriented to person, place, and time. Coordination normal.  Speech is clear, able to follow commands CN III-XII intact Normal strength in upper and lower extremities bilaterally including dorsiflexion and plantar flexion, strong and equal grip strength Sensation normal to light and sharp touch Moves extremities without ataxia,  coordination intact Normal finger to nose and rapid alternating movements No pronator drift  Skin: Skin is warm and dry. Capillary refill takes less than 2 seconds. She is not diaphoretic.  Psychiatric: She has a normal mood and affect. Her behavior is normal.  Nursing note and vitals reviewed.    ED Treatments / Results  Labs (all labs ordered are listed, but only abnormal results are displayed) Labs Reviewed  CBC WITH DIFFERENTIAL/PLATELET - Abnormal; Notable for the following components:      Result Value   RBC 3.80 (*)    Platelets 147 (*)    All other components within normal limits  COMPREHENSIVE METABOLIC PANEL - Abnormal; Notable for the following components:   Potassium 2.9 (*)    Chloride 98 (*)    Creatinine, Ser 1.26 (*)    Calcium 8.0 (*)    AST 129 (*)    GFR calc non Af Amer 45 (*)    GFR  calc Af Amer 53 (*)    Anion gap 22 (*)    All other components within normal limits  RAPID URINE DRUG SCREEN, HOSP PERFORMED - Abnormal; Notable for the following components:   Cocaine POSITIVE (*)    All other components within normal limits  URINALYSIS, ROUTINE W REFLEX MICROSCOPIC - Abnormal; Notable for the following components:   APPearance HAZY (*)    Ketones, ur 5 (*)    All other components within normal limits  ETHANOL - Abnormal; Notable for the following components:   Alcohol, Ethyl (B) 217 (*)    All other components within normal limits  MAGNESIUM - Abnormal; Notable for the following components:   Magnesium 1.1 (*)    All other components within normal limits  I-STAT TROPONIN, ED  CBG MONITORING, ED    EKG  EKG Interpretation  Date/Time:  Friday July 26 2017 12:30:25 EST Ventricular Rate:  78 PR Interval:    QRS Duration: 90 QT Interval:  466 QTC Calculation: 531 R Axis:   6 Text Interpretation:  Sinus rhythm Prolonged QT interval prolonged QTc unchanged  Confirmed by Wandra Arthurs [phone]) on 07/26/2017 12:48:19 PM       Radiology Ct Head Wo  Contrast  Result Date: 07/26/2017 CLINICAL DATA:  Seizure EXAM: CT HEAD WITHOUT CONTRAST TECHNIQUE: Contiguous axial images were obtained from the base of the skull through the vertex without intravenous contrast. COMPARISON:  06/08/2016 FINDINGS: Brain: No evidence of acute infarction, hemorrhage, hydrocephalus, extra-axial collection or mass lesion/mass effect. Subcortical white matter and periventricular small vessel ischemic changes. Vascular: Mild intracranial atherosclerosis. Skull: Old left medial orbital wall blowout fracture. Sinuses/Orbits: The visualized paranasal sinuses are essentially clear. The mastoid air cells are unopacified. Other: None. IMPRESSION: No evidence of acute intracranial abnormality. Small vessel ischemic changes. Electronically Signed   By: Julian Hy M.D.   On: 07/26/2017 13:09    Procedures Procedures (including critical care time)  Medications Ordered in ED Medications  sodium chloride 0.9 % bolus 1,000 mL (0 mLs Intravenous Stopped 07/26/17 1740)  potassium chloride 10 mEq in 100 mL IVPB (0 mEq Intravenous Stopped 07/26/17 1740)  potassium chloride SA (K-DUR,KLOR-CON) CR tablet 40 mEq (40 mEq Oral Given 07/26/17 1522)  magnesium sulfate IVPB 2 g 50 mL (0 g Intravenous Stopped 07/26/17 1924)     Initial Impression / Assessment and Plan / ED Course  I have reviewed the triage vital signs and the nursing notes.  Pertinent labs & imaging results that were available during my care of the patient were reviewed by me and considered in my medical decision making (see chart for details).  Patient presents to the ED for evaluation of 30 sec episode of seizure-like activity at work today.  Patient reports similar episode on Wednesday was not evaluated by medical professional.  Patient did not fall to the ground or hit her head, does report incontinence during episode.  No evidence of trauma to the tongue.  No signs of head trauma.  Recent with normal vitals not acutely  ill-appearing.  No neurologic deficits on exam.  Workup initiated for new onset seizure, basic labs, urine, UDS, EKG troponin and head CT.  Will also add on ethanol level as patient reports daily alcohol use, to ensure seizure is not related to acute alcohol withdrawal.  CT head shows no acute intracranial abnormality there is small vessel ischemic changes.  Labs show no leukocytosis and normal hemoglobin.  Potassium is 2.9, magnesium checked as well and is  1.1 will replace both of these here in the ED, IV and oral potassium ordered as well as 2 g of IV magnesium.  Creatinine is 1.26, baseline is around 0.9, patient appears slightly dehydrated on exam, will provide with fluid bolus.  AST is elevated, has been in the past as well, other liver functions within normal limits.  UA without signs of infection.  UDS positive for cocaine, negative for all other substances.  Troponin negative and EKG without concerning ischemic changes shows QT prolongation which appears to be chronic and unchanged.  Ethanol level is 217, therefore patient is not in alcohol withdrawal causing the seizure, patient is also not exhibiting any other symptoms of alcohol withdrawal no tachycardia, tremors or hallucinations.  Potassium and magnesium replaced here in the ED.  Patient tolerating p.o. fluids and food.  At this time she is stable for discharge home.  Stressed importance of follow-up with [facility] for recheck of electrolytes.  As well as follow-up with neurology.  Patient has been provided with seizure precautions, instructed not to drive until she is cleared by neurology.  Encouraged to reduce alcohol intake and cocaine use.  Strict return precautions discussed with the patient she expresses understanding and is in agreement with plan.  Bus pass provided and patient discharged in no acute distress.  Final Clinical Impressions(s) / ED Diagnoses   Final diagnoses:  Seizure-like activity ([place])    Hypokalemia  Hypomagnesemia  Alcohol use  Cocaine use    ED Discharge Orders    None       Jacqlyn Larsen, [place] 07/27/17 0054    Drenda Freeze, MD 07/29/17 [phone]

## 2017-08-01 ENCOUNTER — Emergency Department (HOSPITAL_COMMUNITY)
Admission: EM | Admit: 2017-08-01 | Discharge: 2017-08-01 | Disposition: A | Discharge status: Home / Self Care | Payer: Self-pay | Attending: Emergency Medicine | Admitting: Emergency Medicine

## 2017-08-01 ENCOUNTER — Encounter (HOSPITAL_COMMUNITY): Payer: Self-pay | Admitting: Emergency Medicine

## 2017-08-01 ENCOUNTER — Other Ambulatory Visit: Payer: Self-pay

## 2017-08-01 DIAGNOSIS — Z09 Encounter for follow-up examination after completed treatment for conditions other than malignant neoplasm: Secondary | ICD-10-CM | POA: Insufficient documentation

## 2017-08-01 DIAGNOSIS — Z79899 Other long term (current) drug therapy: Secondary | ICD-10-CM | POA: Insufficient documentation

## 2017-08-01 NOTE — ED Provider Notes (Signed)
[facility] EMERGENCY DEPARTMENT Provider Note   CSN: [id number] Arrival date & time: 08/01/17  1446     History   Chief Complaint Chief Complaint  Patient presents with  . Follow-up    HPI Christie Morrison is a 62 y.o. female with multiple medical problems and poly substance abuse who presents to the ED for a work note to return to work. Patient was evaluated here in the ED after a syncopal episode with seizure like activity and referred to a neurologist. Patient reports that she can not go to a neurologist because she does not have the money and no insurance. Patient does report that the social worker got her a follow up appointment for next week. Patient denies having any problems since last visit.   HPI  Past Medical History:  Diagnosis Date  . Acute hepatitis   . Cirrhosis ([place])   . Crack cocaine use   . Diverticulosis   . Duodenitis   . ETOH abuse   . Gallstones   . High cholesterol   . Mallory-Weiss tear     Patient Active Problem List   Diagnosis Date Noted  . Cirrhosis, alcoholic ([place])   . Acute hepatitis   . Dehydration, severe 06/08/2016  . Elevated lactic acid level 06/08/2016  . Alcohol abuse 06/08/2016  . Melena 06/08/2016  . Cocaine abuse ([place]) 06/08/2016  . Thrombocytopenia ([place]) 06/08/2016  . Acute hypernatremia 06/08/2016  . Acute alcohol intoxication ([place]) 06/08/2016    Past Surgical History:  Procedure Laterality Date  . CESAREAN SECTION      OB History    No data available       Home Medications    Prior to Admission medications   Medication Sig Start Date End Date Taking? Authorizing Provider  amLODipine (NORVASC) 5 MG tablet Take 1 tablet (5 mg total) by mouth daily. 06/14/16   Robbie Lis, MD  pantoprazole (PROTONIX) 40 MG tablet Take 1 tablet (40 mg total) by mouth daily. 06/14/16   Robbie Lis, MD    Family History Family History  Problem Relation Age of Onset  . Diabetes Mellitus II Mother   .  Kidney failure Mother   . Alcohol abuse Mother   . Diabetes Mellitus II Brother   . Kidney failure Brother   . Alcohol abuse Maternal Grandmother     Social History Social History   Tobacco Use  . Smoking status: Never Smoker  . Smokeless tobacco: Never Used  Substance Use Topics  . Alcohol use: Yes    Comment: Pt states she drinks 1/2 gallon of vodka/week  . Drug use: Yes    Frequency: 1.0 times per week    Types: Cocaine    Comment: States uses cocaine x1 a week     Allergies   Patient has no known allergies.   Review of Systems Review of Systems  Constitutional: Negative for fever.  Neurological: Negative for seizures.  All other systems reviewed and are negative.    Physical Exam Updated Vital Signs BP 132/76 (BP Location: Right Arm)   Pulse (!) 106   Temp 98.6 F (37 C) (Oral)   Resp 16   SpO2 98%   Physical Exam  Constitutional: She appears well-developed and well-nourished. No distress.  HENT:  Head: Normocephalic.  Eyes: EOM are normal.  Neck: Neck supple.  Cardiovascular: Normal rate.  Pulmonary/Chest: Effort normal.  Musculoskeletal: Normal range of motion.  Neurological: She is alert.  Skin: Skin is warm  and dry.  Nursing note and vitals reviewed.    ED Treatments / Results  Labs (all labs ordered are listed, but only abnormal results are displayed) Labs Reviewed - No data to display Radiology No results found.  Procedures Procedures (including critical care time)  Medications Ordered in ED Medications - No data to display   Initial Impression / Assessment and Plan / ED Course  I have reviewed the triage vital signs and the nursing notes.  62 y.o. female here for work note saying she has been screened and released to return to work. Discussed with the patient that her last visit recommended a f/u with neurology to determine return to work status or with primary care follow up. Patient will f/u as scheduled next week. Stable for  d/c.   Final Clinical Impressions(s) / ED Diagnoses   Final diagnoses:  Follow up    ED Discharge Orders    None       Debroah Baller [place], [place] 08/01/17 Ilsa Iha    Quintella Reichert, MD 08/03/17 [phone]

## 2017-08-01 NOTE — ED Triage Notes (Signed)
Patient seen on 3/8 for syncopal event, referred to neurologist for possible seizure disorder. Unable to afford neurologist visit, patient's place of work requiring doctor's note clearing her to return to work. Patient in no apparent distress at this time.

## 2017-08-01 NOTE — ED Notes (Signed)
Pt stable, ambulatory, and verbalizes understanding of d/c instructions.  

## 2017-08-01 NOTE — Discharge Instructions (Signed)
Follow up as scheduled next week with the doctor.

## 2017-08-02 ENCOUNTER — Ambulatory Visit: Payer: Self-pay | Admitting: Neurology

## 2017-08-08 ENCOUNTER — Other Ambulatory Visit: Payer: Self-pay

## 2017-08-08 ENCOUNTER — Ambulatory Visit: Payer: Self-pay | Attending: Family Medicine | Admitting: Physician Assistant

## 2017-08-08 VITALS — BP 134/85 | HR 85 | Temp 97.9°F | Resp 18 | Ht 70.0 in | Wt 140.0 lb

## 2017-08-08 DIAGNOSIS — K7460 Unspecified cirrhosis of liver: Secondary | ICD-10-CM | POA: Insufficient documentation

## 2017-08-08 DIAGNOSIS — R569 Unspecified convulsions: Secondary | ICD-10-CM | POA: Insufficient documentation

## 2017-08-08 DIAGNOSIS — E7800 Pure hypercholesterolemia, unspecified: Secondary | ICD-10-CM | POA: Insufficient documentation

## 2017-08-08 DIAGNOSIS — F1910 Other psychoactive substance abuse, uncomplicated: Secondary | ICD-10-CM

## 2017-08-08 DIAGNOSIS — Z79899 Other long term (current) drug therapy: Secondary | ICD-10-CM | POA: Insufficient documentation

## 2017-08-08 DIAGNOSIS — E876 Hypokalemia: Secondary | ICD-10-CM

## 2017-08-08 DIAGNOSIS — F1010 Alcohol abuse, uncomplicated: Secondary | ICD-10-CM | POA: Insufficient documentation

## 2017-08-08 NOTE — Progress Notes (Signed)
Patient is here for hospital f/up   Patient denies pain   Patient wants a release  letter to go back to work

## 2017-08-08 NOTE — Progress Notes (Signed)
Christie Morrison  YHC:[id number]  DVV:[id number]  DOB - Dec 30, 1955  Chief Complaint  Patient presents with  . Follow-up    seizure like activity       Subjective:   Christie Morrison is a 62 y.o. female here today for establishment of care. She has a past medical history of alcohol and cocaine abuse, hyperlipidemia and cirrhosis of the liver.It looks like she may have some history of hypertension as well Norvasc has been prescribed at some point. She is not currently taking this medication. She on 07/26/2017 presented to the emergency department via EMS after seizure-like activity was was witnessed by coworkers. She was standing and was able to lower herself to the ground she did lose urine continence. She was shaking for about 30 seconds. The coworkers believed that she seized. She admits to daily vodka shots. She missed intermittent cocaine use.  In the emergency department her vital signs were stable. Her platelet count was 147. Her potassium was 2.9. Magnesium 1.1. Creatinine 1.26. She was positive for cocaine and alcohol. A CT scan of the head did not show any acute changes.  She was given IV fluids and her electrolytes were replaced. She was told to see a neurologist and stay out of work until that time.  Since discharge she has been allowed by her job to do some part-time work. No further seizure-like activity. No blackouts. No headaches. No dizziness. She has stopped cocaine use. She still drinks alcohol daily. Nonsmoker. No new complaints.  ROS: GEN: denies fever or chills, denies change in weight Skin: denies lesions or rashes HEENT: denies headache, earache, epistaxis, sore throat, or neck pain LUNGS: denies SHOB, dyspnea, PND, orthopnea CV: denies CP or palpitations ABD: denies abd pain, N or V EXT: denies muscle spasms or swelling; no pain in lower ext, no weakness NEURO: denies numbness or tingling, denies sz, stroke or TIA   ALLERGIES: No Known Allergies  PAST  MEDICAL HISTORY: Past Medical History:  Diagnosis Date  . Acute hepatitis   . Cirrhosis ([place])   . Crack cocaine use   . Diverticulosis   . Duodenitis   . ETOH abuse   . Gallstones   . High cholesterol   . Mallory-Weiss tear     PAST SURGICAL HISTORY: Past Surgical History:  Procedure Laterality Date  . CESAREAN SECTION      MEDICATIONS AT HOME: Prior to Admission medications   Medication Sig Start Date End Date Taking? Authorizing Provider  amLODipine (NORVASC) 5 MG tablet Take 1 tablet (5 mg total) by mouth daily. Patient not taking: Reported on 08/08/2017 06/14/16   Robbie Lis, MD  pantoprazole (PROTONIX) 40 MG tablet Take 1 tablet (40 mg total) by mouth daily. Patient not taking: Reported on 08/08/2017 06/14/16   Robbie Lis, MD    Family History  Problem Relation Age of Onset  . Diabetes Mellitus II Mother   . Kidney failure Mother   . Alcohol abuse Mother   . Diabetes Mellitus II Brother   . Kidney failure Brother   . Alcohol abuse Maternal Grandmother    Social-unmarried, works at a [employer], +ETOH abuse, nonsmoker  Objective:   Vitals:   08/08/17 1045  BP: 134/85  Pulse: 85  Resp: 18  Temp: 97.9 F (36.6 C)  TempSrc: Oral  SpO2: 96%  Weight: 140 lb (63.5 kg)  Height: 5\' 10"  (1.778 m)    Exam General appearance : Awake, alert, not in any distress. Speech Clear. Not toxic  looking HEENT: Atraumatic and Normocephalic, pupils equally reactive to light and accomodation Neck: supple, no JVD. No cervical lymphadenopathy.  Chest:Good air entry bilaterally, no added sounds  CVS: S1 S2 regular, no murmurs.  Extremities: B/L Lower Ext shows no edema, both legs are warm to touch Neurology: Awake alert, and oriented X 3, CN II-XII intact, Non focal Skin:No Rash   Assessment & Plan  1. Seizure Like Activity  -avoid driving; back to work with restrictions-see  Work note  -neurology referral, needs EEG etc  -CT head favorable  2. ETOH abuse  -Not  ready to QUIT  3. Abnormal Electrolytes  -replace K+ and Mg+  -recheck labs today    Return in about 1 month (around 09/05/2017).  The patient was given clear instructions to go to ER or return to medical center if symptoms don't improve, worsen or new problems develop. The patient verbalized understanding. The patient was told to call to get lab results if they haven't heard anything in the next week.   Total time spent with patient was 32 min. Greater than 50 % of this visit was spent face to face counseling and coordinating care regarding risk factor modification, compliance importance and encouragement, education related to ED visit.  This note has been created with Surveyor, quantity. Any transcriptional errors are unintentional.    Zettie Pho, PA-C [facility] [place], [place]   08/08/2017, 12:24 PM

## 2017-08-08 NOTE — Patient Instructions (Addendum)
It is okay for you to return to work. However, you should not be operating heavy machinery or using knives for your safety and the safety of others around you until further evaluation by a neurologist. A neurology for referral is in place. No driving.  Return to your follow up visit next month please!!

## 2017-08-09 LAB — BASIC METABOLIC PANEL
BUN/Creatinine Ratio: 7 — ABNORMAL LOW (ref 12–28)
BUN: 5 mg/dL — AB (ref 8–27)
CALCIUM: 8.1 mg/dL — AB (ref 8.7–10.3)
CHLORIDE: 102 mmol/L (ref 96–106)
CO2: 24 mmol/L (ref 20–29)
Creatinine, Ser: 0.69 mg/dL (ref 0.57–1.00)
GFR calc non Af Amer: 95 mL/min/{1.73_m2} (ref >59)
GFR, EST AFRICAN AMERICAN: 109 mL/min/{1.73_m2} (ref >59)
GLUCOSE: 94 mg/dL (ref 65–99)
Potassium: 3.2 mmol/L — ABNORMAL LOW (ref 3.5–5.2)
Sodium: 144 mmol/L (ref 134–144)

## 2017-08-09 LAB — MAGNESIUM: MAGNESIUM: 1.1 mg/dL — AB (ref 1.6–2.3)

## 2017-08-13 ENCOUNTER — Encounter: Payer: Self-pay | Admitting: Neurology

## 2017-08-14 MED ORDER — POTASSIUM CHLORIDE CRYS ER 20 MEQ PO TBCR: 20.0000 meq | EXTENDED_RELEASE_TABLET | Freq: Every day | ORAL | 2 refills | Status: DC

## 2017-08-14 MED ORDER — MAGNESIUM OXIDE 400 MG PO TABS: 400.0000 mg | ORAL_TABLET | Freq: Two times a day (BID) | ORAL | 1 refills | Status: DC

## 2017-08-14 NOTE — Addendum Note (Signed)
Addended byZettie Pho S on: 08/14/2017 12:11 PM   Modules accepted: Orders

## 2017-08-15 ENCOUNTER — Telehealth: Payer: Self-pay | Admitting: *Deleted

## 2017-08-15 NOTE — Telephone Encounter (Signed)
Notes recorded by Carilyn Goodpasture, RN on 08/15/2017 at 9:11 AM EDT Attempt to contact patient, Unable to reach, mailbox is full. ------  Notes recorded by Brayton Caves, PA-C on 08/14/2017 at 12:11 PM EDT Please let her know that her kidney function looks much better however her potassium and magnesium remained low. Supplementations have been sent to her pharmacy. Please encourage her to continue to abstain from alcohol and to pick up the supplements. Keep follow-up. Thank you.

## 2017-08-16 NOTE — Telephone Encounter (Signed)
Mailbox is full, unable to leave message.

## 2017-08-22 NOTE — Telephone Encounter (Signed)
Left message for patient to return call on voicemail.

## 2017-10-01 ENCOUNTER — Ambulatory Visit: Payer: Self-pay | Admitting: Internal Medicine

## 2017-10-23 ENCOUNTER — Other Ambulatory Visit: Payer: Self-pay

## 2017-10-23 ENCOUNTER — Encounter (HOSPITAL_COMMUNITY): Payer: Self-pay

## 2017-10-23 ENCOUNTER — Emergency Department (HOSPITAL_COMMUNITY): Payer: Self-pay

## 2017-10-23 ENCOUNTER — Emergency Department (HOSPITAL_COMMUNITY)
Admission: EM | Admit: 2017-10-23 | Discharge: 2017-10-23 | Disposition: A | Discharge status: Home / Self Care | Payer: Self-pay | Attending: Emergency Medicine | Admitting: Emergency Medicine

## 2017-10-23 DIAGNOSIS — E876 Hypokalemia: Secondary | ICD-10-CM | POA: Insufficient documentation

## 2017-10-23 DIAGNOSIS — E860 Dehydration: Secondary | ICD-10-CM | POA: Insufficient documentation

## 2017-10-23 DIAGNOSIS — R55 Syncope and collapse: Secondary | ICD-10-CM | POA: Insufficient documentation

## 2017-10-23 LAB — URINALYSIS, ROUTINE W REFLEX MICROSCOPIC
Bilirubin Urine: NEGATIVE
Glucose, UA: NEGATIVE mg/dL
Hgb urine dipstick: NEGATIVE
Ketones, ur: 5 mg/dL — AB
Nitrite: NEGATIVE
Protein, ur: NEGATIVE mg/dL
Specific Gravity, Urine: 1.008 (ref 1.005–1.030)
pH: 5 (ref 5.0–8.0)

## 2017-10-23 LAB — CBC WITH DIFFERENTIAL/PLATELET
Abs Immature Granulocytes: 0 10*3/uL (ref 0.0–0.1)
Basophils Absolute: 0 10*3/uL (ref 0.0–0.1)
Basophils Relative: 0 %
Eosinophils Absolute: 0.2 10*3/uL (ref 0.0–0.7)
Eosinophils Relative: 3 %
HCT: 30.6 % — ABNORMAL LOW (ref 36.0–46.0)
Hemoglobin: 10.6 g/dL — ABNORMAL LOW (ref 12.0–15.0)
Immature Granulocytes: 0 %
Lymphocytes Relative: 26 %
Lymphs Abs: 1.4 10*3/uL (ref 0.7–4.0)
MCH: 34 pg (ref 26.0–34.0)
MCHC: 34.6 g/dL (ref 30.0–36.0)
MCV: 98.1 fL (ref 78.0–100.0)
Monocytes Absolute: 0.5 10*3/uL (ref 0.1–1.0)
Monocytes Relative: 9 %
Neutro Abs: 3.4 10*3/uL (ref 1.7–7.7)
Neutrophils Relative %: 62 %
Platelets: 105 10*3/uL — ABNORMAL LOW (ref 150–400)
RBC: 3.12 MIL/uL — ABNORMAL LOW (ref 3.87–5.11)
RDW: 13.5 % (ref 11.5–15.5)
WBC: 5.5 10*3/uL (ref 4.0–10.5)

## 2017-10-23 LAB — I-STAT TROPONIN, ED: Troponin i, poc: 0 ng/mL (ref 0.00–0.08)

## 2017-10-23 LAB — RAPID URINE DRUG SCREEN, HOSP PERFORMED
Amphetamines: NOT DETECTED
Barbiturates: NOT DETECTED
Benzodiazepines: NOT DETECTED
Cocaine: POSITIVE — AB
Opiates: NOT DETECTED
Tetrahydrocannabinol: NOT DETECTED

## 2017-10-23 LAB — BASIC METABOLIC PANEL
Anion gap: 20 — ABNORMAL HIGH (ref 5–15)
BUN: 6 mg/dL (ref 6–20)
CO2: 21 mmol/L — ABNORMAL LOW (ref 22–32)
Calcium: 8.6 mg/dL — ABNORMAL LOW (ref 8.9–10.3)
Chloride: 93 mmol/L — ABNORMAL LOW (ref 101–111)
Creatinine, Ser: 1.12 mg/dL — ABNORMAL HIGH (ref 0.44–1.00)
GFR calc Af Amer: >60 mL/min (ref >60)
GFR calc non Af Amer: 52 mL/min — ABNORMAL LOW (ref >60)
Glucose, Bld: 135 mg/dL — ABNORMAL HIGH (ref 65–99)
Potassium: 2.8 mmol/L — ABNORMAL LOW (ref 3.5–5.1)
Sodium: 134 mmol/L — ABNORMAL LOW (ref 135–145)

## 2017-10-23 LAB — HEPATIC FUNCTION PANEL
ALT: 38 U/L (ref 14–54)
AST: 226 U/L — ABNORMAL HIGH (ref 15–41)
Albumin: 4.2 g/dL (ref 3.5–5.0)
Alkaline Phosphatase: 91 U/L (ref 38–126)
Bilirubin, Direct: 0.5 mg/dL (ref 0.1–0.5)
Indirect Bilirubin: 1 mg/dL — ABNORMAL HIGH (ref 0.3–0.9)
Total Bilirubin: 1.5 mg/dL — ABNORMAL HIGH (ref 0.3–1.2)
Total Protein: 7.5 g/dL (ref 6.5–8.1)

## 2017-10-23 LAB — MAGNESIUM: Magnesium: 1 mg/dL — ABNORMAL LOW (ref 1.7–2.4)

## 2017-10-23 LAB — ETHANOL: Alcohol, Ethyl (B): 15 mg/dL — ABNORMAL HIGH (ref <10)

## 2017-10-23 MED ORDER — MAGNESIUM OXIDE 400 MG PO TABS: 400.0000 mg | ORAL_TABLET | Freq: Two times a day (BID) | ORAL | 0 refills | Status: DC

## 2017-10-23 MED ORDER — POTASSIUM CHLORIDE 10 MEQ/100ML IV SOLN
10.0000 meq | INTRAVENOUS | Status: AC
  Administered 2017-10-23 (×2): 10 meq via INTRAVENOUS
  Filled 2017-10-23 (×2): qty 100

## 2017-10-23 MED ORDER — POTASSIUM CHLORIDE CRYS ER 20 MEQ PO TBCR: 20.0000 meq | EXTENDED_RELEASE_TABLET | Freq: Every day | ORAL | 0 refills | Status: DC

## 2017-10-23 MED ORDER — SODIUM CHLORIDE 0.9 % IV BOLUS
1000.0000 mL | Freq: Once | INTRAVENOUS | Status: AC
  Administered 2017-10-23: 1000 mL via INTRAVENOUS

## 2017-10-23 MED ORDER — MAGNESIUM SULFATE 2 GM/50ML IV SOLN
2.0000 g | Freq: Once | INTRAVENOUS | Status: AC
  Administered 2017-10-23: 2 g via INTRAVENOUS
  Filled 2017-10-23: qty 50

## 2017-10-23 MED ORDER — POTASSIUM CHLORIDE CRYS ER 20 MEQ PO TBCR
40.0000 meq | EXTENDED_RELEASE_TABLET | Freq: Once | ORAL | Status: DC
  Filled 2017-10-23: qty 2

## 2017-10-23 NOTE — ED Notes (Signed)
Pt stable, ambulatory, states understanding of discharge instructions 

## 2017-10-23 NOTE — Discharge Instructions (Addendum)
Resume taking your potassium and magnesium.  Please follow-up and establish care with the [facility] and [facility] for further management and refill these prescriptions.  Use ice to your eye 3-4 times daily alternating 15 minutes on, 15 minutes off.  Make sure to drink plenty of fluids daily.  Try to avoid alcohol.  Please return to emergency department if you develop any new or worsening symptoms.

## 2017-10-23 NOTE — ED Triage Notes (Addendum)
Pt arrived [facility]; per EMS pt has experienced increased falls x 2weeks; pt states its been going on for a month, with most recent on today with recurrent injury to L face; two Saturdays ago with face first fall on concrete causing hematoma to L eye and head; pt states recently in hospital with low K+; CBG 120, 110/78, 77, 116. No line access

## 2017-10-23 NOTE — ED Provider Notes (Addendum)
[facility] EMERGENCY DEPARTMENT Provider Note   CSN: [id number] Arrival date & time: 10/23/17  1342     History   Chief Complaint Chief Complaint  Patient presents with  . Loss of Consciousness    HPI Christie Morrison is a 62 y.o. female with history of cirrhosis, hepatitis, alcohol and cocaine use who presents following syncopal episode and fall.  Patient reports she has had increased falls over the past 2 weeks and fell 2 weeks ago face first on concrete causing her to have a hematoma over her left eye.  She reports she was recently hospitalized with low potassium.  Patient does not remember feeling dizzy or lightheaded prior to falling today and states she just passed out.  She did hit her head again today.  She denies any pain at this time.  She does note to be a little dizzy.  She denies any chest pain, shortness of breath, abdominal pain, nausea, vomiting, urinary symptoms, neck or back pain.  Patient did not take any medications this morning.  She reports she did eat a fish sandwich, but did not drink any fluids prior to arrival here.  HPI  Past Medical History:  Diagnosis Date  . Acute hepatitis   . Cirrhosis ([place])   . Crack cocaine use   . Diverticulosis   . Duodenitis   . ETOH abuse   . Gallstones   . High cholesterol   . Mallory-Weiss tear     Patient Active Problem List   Diagnosis Date Noted  . Cirrhosis, alcoholic ([place])   . Acute hepatitis   . Dehydration, severe 06/08/2016  . Elevated lactic acid level 06/08/2016  . Alcohol abuse 06/08/2016  . Melena 06/08/2016  . Cocaine abuse ([place]) 06/08/2016  . Thrombocytopenia ([place]) 06/08/2016  . Acute hypernatremia 06/08/2016  . Acute alcohol intoxication ([place]) 06/08/2016    Past Surgical History:  Procedure Laterality Date  . CESAREAN SECTION       OB History   None      Home Medications    Prior to Admission medications   Medication Sig Start Date End Date Taking? Authorizing  Provider  amLODipine (NORVASC) 5 MG tablet Take 1 tablet (5 mg total) by mouth daily. Patient not taking: Reported on 08/08/2017 06/14/16   Robbie Lis, MD  magnesium oxide (MAG-OX) 400 MG tablet Take 1 tablet (400 mg total) by mouth 2 (two) times daily. 10/23/17   Kenton Fortin, Bea Graff, PA-C  pantoprazole (PROTONIX) 40 MG tablet Take 1 tablet (40 mg total) by mouth daily. Patient not taking: Reported on 08/08/2017 06/14/16   Robbie Lis, MD  potassium chloride SA (K-DUR,KLOR-CON) 20 MEQ tablet Take 1 tablet (20 mEq total) by mouth daily. 10/23/17   Frederica Kuster, PA-C    Family History Family History  Problem Relation Age of Onset  . Diabetes Mellitus II Mother   . Kidney failure Mother   . Alcohol abuse Mother   . Diabetes Mellitus II Brother   . Kidney failure Brother   . Alcohol abuse Maternal Grandmother     Social History Social History   Tobacco Use  . Smoking status: Never Smoker  . Smokeless tobacco: Never Used  Substance Use Topics  . Alcohol use: Yes    Comment: Pt states she drinks 1/2 gallon of vodka/week  . Drug use: Yes    Frequency: 1.0 times per week    Types: Cocaine    Comment: States uses cocaine x1 a week  Allergies   Patient has no known allergies.   Review of Systems Review of Systems  Constitutional: Negative for chills and fever.  HENT: Negative for facial swelling and sore throat.   Eyes: Negative for visual disturbance.  Respiratory: Negative for shortness of breath.   Cardiovascular: Negative for chest pain.  Gastrointestinal: Negative for abdominal pain, nausea and vomiting.  Genitourinary: Negative for dysuria.  Musculoskeletal: Negative for back pain.  Skin: Negative for rash and wound.  Neurological: Positive for dizziness and syncope. Negative for headaches.  Psychiatric/Behavioral: The patient is not nervous/anxious.      Physical Exam Updated Vital Signs BP 122/80   Pulse 95   Temp 98.1 F (36.7 C) (Oral)   Resp 18   Ht  5\' 10"  (1.778 m)   Wt 72.6 kg (160 lb)   SpO2 100%   BMI 22.96 kg/m   Physical Exam  Constitutional: She appears well-developed and well-nourished. No distress.  HENT:  Head: Normocephalic and atraumatic.    Mouth/Throat: Oropharynx is clear and moist. No oropharyngeal exudate.  Eyes: Pupils are equal, round, and reactive to light. EOM and lids are normal. Right eye exhibits no discharge. Left eye exhibits no discharge. Left conjunctiva has a hemorrhage (patient reports from last fall). No scleral icterus.  Neck: Normal range of motion. Neck supple. No thyromegaly present.  Cardiovascular: Normal rate, regular rhythm, normal heart sounds and intact distal pulses. Exam reveals no gallop and no friction rub.  No murmur heard. Pulmonary/Chest: Effort normal and breath sounds normal. No stridor. No respiratory distress. She has no wheezes. She has no rales.  Abdominal: Soft. Bowel sounds are normal. She exhibits no distension. There is no tenderness. There is no rebound and no guarding.  Musculoskeletal: She exhibits no edema.  No midline tenderness to the cervical, thoracic, or lumbar spine  Lymphadenopathy:    She has no cervical adenopathy.  Neurological: She is alert. Coordination normal.  CN 3-12 intact; normal sensation throughout; 5/5 strength in all 4 extremities; equal bilateral grip strength  Skin: Skin is warm and dry. No rash noted. She is not diaphoretic. No pallor.  Psychiatric: She has a normal mood and affect.  Nursing note and vitals reviewed.    ED Treatments / Results  Labs (all labs ordered are listed, but only abnormal results are displayed) Labs Reviewed  BASIC METABOLIC PANEL - Abnormal; Notable for the following components:      Result Value   Sodium 134 (*)    Potassium 2.8 (*)    Chloride 93 (*)    CO2 21 (*)    Glucose, Bld 135 (*)    Creatinine, Ser 1.12 (*)    Calcium 8.6 (*)    GFR calc non Af Amer 52 (*)    Anion gap 20 (*)    All other  components within normal limits  CBC WITH DIFFERENTIAL/PLATELET - Abnormal; Notable for the following components:   RBC 3.12 (*)    Hemoglobin 10.6 (*)    HCT 30.6 (*)    Platelets 105 (*)    All other components within normal limits  URINALYSIS, ROUTINE W REFLEX MICROSCOPIC - Abnormal; Notable for the following components:   APPearance HAZY (*)    Ketones, ur 5 (*)    Leukocytes, UA MODERATE (*)    Bacteria, UA RARE (*)    All other components within normal limits  RAPID URINE DRUG SCREEN, HOSP PERFORMED - Abnormal; Notable for the following components:   Cocaine POSITIVE (*)  All other components within normal limits  HEPATIC FUNCTION PANEL - Abnormal; Notable for the following components:   AST 226 (*)    Total Bilirubin 1.5 (*)    Indirect Bilirubin 1.0 (*)    All other components within normal limits  MAGNESIUM - Abnormal; Notable for the following components:   Magnesium 1.0 (*)    All other components within normal limits  ETHANOL - Abnormal; Notable for the following components:   Alcohol, Ethyl (B) 15 (*)    All other components within normal limits  I-STAT TROPONIN, ED    EKG EKG Interpretation  Date/Time:  Wednesday October 23 2017 14:28:44 EDT Ventricular Rate:  91 PR Interval:  140 QRS Duration: 82 QT Interval:  412 QTC Calculation: 506 R Axis:     Text Interpretation:  Normal sinus rhythm Possible Anterior infarct , age undetermined Abnormal ECG Confirmed by Davonna Belling [phone]) on 10/23/2017 4:26:32 PM   Radiology Ct Head Wo Contrast  Result Date: 10/23/2017 CLINICAL DATA:  Seizure episode 2 weeks ago with blunt trauma to the left face EXAM: CT HEAD WITHOUT CONTRAST TECHNIQUE: Contiguous axial images were obtained from the base of the skull through the vertex without intravenous contrast. COMPARISON:  07/26/2017 FINDINGS: Brain: Mild atrophic changes and chronic white matter ischemic changes are seen. The overall appearance is similar to that noted on the  prior exam. Vascular: No hyperdense vessel or unexpected calcification. Skull: Normal. Negative for fracture or focal lesion. Sinuses/Orbits: No acute finding. Other: Soft tissue hematoma is noted in the left periorbital region consistent with the recent injury. No underlying bony abnormality is seen. IMPRESSION: Left periorbital soft tissue hematoma. No acute intracranial abnormality is noted. Chronic ischemic and atrophic changes. Electronically Signed   By: Inez Catalina M.D.   On: 10/23/2017 18:04    Procedures Procedures (including critical care time)  CRITICAL CARE Performed by: Frederica Kuster   Total critical care time: 35 minutes  Critical care time was exclusive of separately billable procedures and treating other patients.  Critical care was necessary to treat or prevent imminent or life-threatening deterioration.  Critical care was time spent personally by me on the following activities: development of treatment plan with patient and/or surrogate as well as nursing, discussions with consultants, evaluation of patient's response to treatment, examination of patient, obtaining history from patient or surrogate, ordering and performing treatments and interventions, ordering and review of laboratory studies, ordering and review of radiographic studies, pulse oximetry and re-evaluation of patient's condition.   Medications Ordered in ED Medications  potassium chloride SA (K-DUR,KLOR-CON) CR tablet 40 mEq (40 mEq Oral Refused 10/23/17 1545)  sodium chloride 0.9 % bolus 1,000 mL (0 mLs Intravenous Stopped 10/23/17 1611)  potassium chloride 10 mEq in 100 mL IVPB (0 mEq Intravenous Stopped 10/23/17 1827)  sodium chloride 0.9 % bolus 1,000 mL (0 mLs Intravenous Stopped 10/23/17 2117)  magnesium sulfate IVPB 2 g 50 mL (0 g Intravenous Stopped 10/23/17 2012)     Initial Impression / Assessment and Plan / ED Course  I have reviewed the triage vital signs and the nursing notes.  Pertinent labs &  imaging results that were available during my care of the patient were reviewed by me and considered in my medical decision making (see chart for details).     Patient presenting after syncope and fall.  Patient orthostatic, which improved after fluids.  Patient found to be hypokalemic and hypomagnesemia which was replaced in the ED.  Discharged home with daily  potassium and mag, which was prescribed at patient's last discharge, however she has not been taking it because she has not establish care with a PCP.  CT head is negative, except for left periorbital soft tissue hematoma.  Advised ice for this.  Patient without vision changes. Suspect patient's hypokalemia and hypomagnesemia related to alcohol use.  Patient reports she has been cutting back and I advised to continue this.  Patient is feeling much better and would like to go home.  Encourage patient to follow-up with PCP.  Also encouraged to stay well-hydrated and eat balanced meals.  Strict return precautions discussed.  Patient understands and agrees with plan.  Patient vitals stable throughout ED course and discharged in satisfactory condition. I discussed patient case with Dr. Alvino Chapel who guided the patient's management and agrees with plan.   Final Clinical Impressions(s) / ED Diagnoses   Final diagnoses:  Dehydration  Hypokalemia  Hypomagnesemia  Syncope and collapse    ED Discharge Orders        Ordered    magnesium oxide (MAG-OX) 400 MG tablet  2 times daily     10/23/17 1919    potassium chloride SA (K-DUR,KLOR-CON) 20 MEQ tablet  Daily     10/23/17 1919       Caryl Ada 10/23/17 2130    Davonna Belling, MD 10/25/17 2257    Frederica Kuster, PA-C 11/04/17 1729    Davonna Belling, MD 11/04/17 2358

## 2017-10-28 ENCOUNTER — Ambulatory Visit (INDEPENDENT_AMBULATORY_CARE_PROVIDER_SITE_OTHER): Payer: Self-pay | Admitting: Neurology

## 2017-10-28 ENCOUNTER — Other Ambulatory Visit: Payer: Self-pay

## 2017-10-28 ENCOUNTER — Encounter: Payer: Self-pay | Admitting: Neurology

## 2017-10-28 VITALS — BP 134/76 | HR 103 | Ht 70.0 in | Wt 132.0 lb

## 2017-10-28 DIAGNOSIS — F1010 Alcohol abuse, uncomplicated: Secondary | ICD-10-CM

## 2017-10-28 DIAGNOSIS — R55 Syncope and collapse: Secondary | ICD-10-CM
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
## 2017-10-28 NOTE — Patient Instructions (Signed)
1. Schedule routine EEG 2. Continue with slowly weaning off alcohol use 3. Follow-up with [facility] to ensure potassium and magnesium levels have improved 4. Our office will call you with EEG results, if normal, follow-up as needed

## 2017-10-30 ENCOUNTER — Ambulatory Visit (INDEPENDENT_AMBULATORY_CARE_PROVIDER_SITE_OTHER): Payer: Self-pay | Admitting: Neurology

## 2017-10-30 DIAGNOSIS — F1010 Alcohol abuse, uncomplicated: Secondary | ICD-10-CM

## 2017-10-30 DIAGNOSIS — R55 Syncope and collapse: Secondary | ICD-10-CM

## 2017-11-01 ENCOUNTER — Ambulatory Visit: Payer: Self-pay | Admitting: Internal Medicine

## 2017-11-13 ENCOUNTER — Telehealth: Payer: Self-pay

## 2017-11-13 NOTE — Telephone Encounter (Signed)
-----   Message from Cameron Sprang, MD sent at 11/13/2017 12:29 PM EDT ----- Pls let her know the brain wave test was normal. Follow-up as needed. thanks

## 2017-11-13 NOTE — Telephone Encounter (Signed)
Called pt. Man answered and did not identify himself.  Left message with him to have pt return my call.

## 2017-11-13 NOTE — Procedures (Signed)
ELECTROENCEPHALOGRAM REPORT  Date of Study: 10/30/2017  Patient's Name: Christie Morrison MRN: [id number] Date of Birth: [birth year]  Referring Provider: Dr. Ellouise Newer  Clinical History: This is a 62 year old woman with recurrent episodes of loss of consciousness, one with "seizure-like activity."  Medications: MAG-OX 400 MG tablet  PROTONIX 40 MG tablet  K-DUR,KLOR-CON 20 MEQ tablet    Technical Summary: A multichannel digital EEG recording measured by the international 10-20 system with electrodes applied with paste and impedances below 5000 ohms performed in our laboratory with EKG monitoring in an awake and asleep patient.  Hyperventilation and photic stimulation were performed.  The digital EEG was referentially recorded, reformatted, and digitally filtered in a variety of bipolar and referential montages for optimal display.    Description: The patient is awake and asleep during the recording.  During maximal wakefulness, there is a symmetric, medium voltage 10 Hz posterior dominant rhythm that attenuates with eye opening.  The record is symmetric.  During drowsiness and sleep, there is an increase in theta slowing of the background.  Vertex waves and symmetric sleep spindles were seen.  Hyperventilation and photic stimulation did not elicit any abnormalities.  There were no epileptiform discharges or electrographic seizures seen.    EKG lead was unremarkable.  Impression: This awake and asleep EEG is normal.    Clinical Correlation: A normal EEG does not exclude a clinical diagnosis of epilepsy. Clinical correlation is advised.   Ellouise Newer, M.D.

## 2017-11-25 ENCOUNTER — Ambulatory Visit: Payer: Self-pay | Attending: Internal Medicine | Admitting: Nurse Practitioner

## 2017-11-25 ENCOUNTER — Encounter: Payer: Self-pay | Admitting: Nurse Practitioner

## 2017-11-25 DIAGNOSIS — E876 Hypokalemia: Secondary | ICD-10-CM

## 2017-11-25 DIAGNOSIS — Z0000 Encounter for general adult medical examination without abnormal findings: Secondary | ICD-10-CM | POA: Insufficient documentation

## 2017-11-25 DIAGNOSIS — D696 Thrombocytopenia, unspecified: Secondary | ICD-10-CM

## 2017-11-25 DIAGNOSIS — K7030 Alcoholic cirrhosis of liver without ascites: Secondary | ICD-10-CM

## 2017-11-25 DIAGNOSIS — F10929 Alcohol use, unspecified with intoxication, unspecified: Secondary | ICD-10-CM

## 2017-11-25 DIAGNOSIS — F10129 Alcohol abuse with intoxication, unspecified: Secondary | ICD-10-CM | POA: Insufficient documentation

## 2017-11-25 DIAGNOSIS — I10 Essential (primary) hypertension: Secondary | ICD-10-CM | POA: Insufficient documentation

## 2017-11-25 MED ORDER — POTASSIUM CHLORIDE CRYS ER 20 MEQ PO TBCR: 20.0000 meq | EXTENDED_RELEASE_TABLET | Freq: Every day | ORAL | 1 refills | Status: DC

## 2017-11-25 MED ORDER — MAGNESIUM OXIDE 400 MG PO TABS: 400.0000 mg | ORAL_TABLET | Freq: Two times a day (BID) | ORAL | 4 refills | Status: DC

## 2017-11-25 MED ORDER — PANTOPRAZOLE SODIUM 40 MG PO TBEC: 40.0000 mg | DELAYED_RELEASE_TABLET | Freq: Every day | ORAL | 0 refills | Status: DC

## 2017-11-25 MED ORDER — MAGNESIUM OXIDE 400 MG PO TABS: 400.0000 mg | ORAL_TABLET | Freq: Two times a day (BID) | ORAL | 2 refills | Status: DC

## 2017-11-25 MED ORDER — PANTOPRAZOLE SODIUM 40 MG PO TBEC: 40.0000 mg | DELAYED_RELEASE_TABLET | Freq: Every day | ORAL | 1 refills | Status: DC

## 2017-11-25 MED FILL — PANTOPRAZOLE SOD DR 40 MG T: 40 | 30 days supply | Qty: 30 | Fill #0

## 2017-11-25 MED FILL — MAGNESIUM OXIDE 400 MG TAB: 400 | 30 days supply | Qty: 60 | Fill #0

## 2017-11-25 MED FILL — POTASSIUM CL ER 20 MEQ TAB: 20 | 30 days supply | Qty: 30 | Fill #0

## 2017-11-25 NOTE — Progress Notes (Signed)
Assessment & Plan:  Verleen was seen today for establish care.  Diagnoses and all orders for this visit:  Hypomagnesemia -     Magnesium -     magnesium oxide (MAG-OX) 400 MG tablet; Take 1 tablet (400 mg total) by mouth 2 (two) times daily.  Alcoholic cirrhosis, unspecified whether ascites present (HCC) -     CBC -     CMP14+EGFR -     Lipid panel -     pantoprazole (PROTONIX) 40 MG tablet; Take 1 tablet (40 mg total) by mouth daily.  Acute alcoholic intoxication with complication (HCC) Stable.   Thrombocytopenia (HCC) -     CBC  Hypokalemia -     potassium chloride SA (K-DUR,KLOR-CON) 20 MEQ tablet; Take 1 tablet (20 mEq total) by mouth daily.  Patient has been counseled on age-appropriate routine health concerns for screening and prevention. These are reviewed and up-to-date. Referrals have been placed accordingly. Immunizations are up-to-date or declined.    Subjective:   Chief Complaint  Patient presents with  . Establish Care    Pt. is here to establish care.    HPI ARYN Morrison 62 y.o. female presents to office today to establish care. She has a history of seizure like activity. Last evaluated by Neurology with EEG on 10-30-2017. EEG was normal. Patient was instructed to follow up as needed by neurology. She currently denies any seizure like activity. She has a history of hypokalemia, hypomagnesemia, EOTH and substance abuse(cocaine), liver cirrhosis, HTN and HPL (she does not have any lipid panel on file).    Essential Hypertension She has not been taking amlodipine. She was not aware that it was even prescribed for her last year after a hospital admission 05-2016. She also was not taking her Protonix as she was not aware that it had been prescribed during the hospital admission as well. She has a history of Mallory Weiss Tear. I expressed the importance of taking Protonix every day as prescribed. Blood pressure is normal today. Will hold off on amlodipine 5 mg at  this time. She currently denies chest pain, shortness of breath, palpitations, lightheadedness, dizziness, headaches or BLE edema BP Readings from Last 3 Encounters:  11/25/17 132/82  10/28/17 134/76  10/23/17 127/79   ETOH Abuse  She has cut back to 2 drinks per day. She is not ready to quit.   Review of Systems  Constitutional: Negative for fever, malaise/fatigue and weight loss.  HENT: Negative.  Negative for nosebleeds.   Eyes: Negative.  Negative for blurred vision, double vision and photophobia.  Respiratory: Negative.  Negative for cough and shortness of breath.   Cardiovascular: Negative.  Negative for chest pain, palpitations and leg swelling.  Gastrointestinal: Positive for heartburn. Negative for nausea and vomiting.  Musculoskeletal: Negative.  Negative for myalgias.  Neurological: Negative.  Negative for dizziness, focal weakness, seizures and headaches.  Psychiatric/Behavioral: Positive for substance abuse. Negative for suicidal ideas.    Past Medical History:  Diagnosis Date  . Acute hepatitis   . Cirrhosis ([place])   . Crack cocaine use   . Duodenitis   . ETOH abuse   . High cholesterol   . Mallory-Weiss tear     Past Surgical History:  Procedure Laterality Date  . CESAREAN SECTION      Family History  Problem Relation Age of Onset  . Diabetes Mellitus II Mother   . Kidney failure Mother   . Alcohol abuse Mother   . Diabetes Mellitus II Brother   .  Kidney failure Brother   . Alcohol abuse Maternal Grandmother     Social History Reviewed with no changes to be made today.   Outpatient Medications Prior to Visit  Medication Sig Dispense Refill  . magnesium oxide (MAG-OX) 400 MG tablet Take 1 tablet (400 mg total) by mouth 2 (two) times daily. 60 tablet 0  . potassium chloride SA (K-DUR,KLOR-CON) 20 MEQ tablet Take 1 tablet (20 mEq total) by mouth daily. 30 tablet 0  . amLODipine (NORVASC) 5 MG tablet Take 1 tablet (5 mg total) by mouth daily. (Patient  not taking: Reported on 08/08/2017) 30 tablet 0  . pantoprazole (PROTONIX) 40 MG tablet Take 1 tablet (40 mg total) by mouth daily. (Patient not taking: Reported on 08/08/2017) 30 tablet 0   No facility-administered medications prior to visit.     No Known Allergies     Objective:    BP 132/82 (BP Location: Left Arm, Patient Position: Sitting, Cuff Size: Small)   Pulse 87   Temp 99 F (37.2 C) (Oral)   Ht _0  (1.778 m)   Wt 127 lb 9.6 oz (57.9 kg)   SpO2 99%   BMI 18.31 kg/m  Wt Readings from Last 3 Encounters:  11/25/17 127 lb 9.6 oz (57.9 kg)  10/28/17 132 lb (59.9 kg)  10/23/17 160 lb (72.6 kg)    Physical Exam  Constitutional: She is oriented to person, place, and time. She appears well-developed and well-nourished. She is cooperative.  HENT:  Head: Normocephalic and atraumatic.  Eyes: EOM are normal.  Neck: Normal range of motion.  Cardiovascular: Normal rate, regular rhythm and normal heart sounds. Exam reveals no gallop and no friction rub.  No murmur heard. Pulmonary/Chest: Effort normal and breath sounds normal. No tachypnea. No respiratory distress. She has no decreased breath sounds. She has no wheezes. She has no rhonchi. She has no rales. She exhibits no tenderness.  Abdominal: Bowel sounds are normal.  Musculoskeletal: Normal range of motion. She exhibits no edema.  Neurological: She is alert and oriented to person, place, and time. Coordination normal.  Skin: Skin is warm and dry.  Psychiatric: She has a normal mood and affect. Her behavior is normal. Judgment and thought content normal.  Nursing note and vitals reviewed.     Patient has been counseled extensively about nutrition and exercise as well as the importance of adherence with medications and regular follow-up. The patient was given clear instructions to go to ER or return to medical center if symptoms don't improve, worsen or new problems develop. The patient verbalized understanding.    Follow-up: Return in about 4 months (around 03/28/2018) for Make appointment on Mondays only. See me in 4 months. , PAP SMEAR.   Gildardo Pounds, FNP-BC [facility] and [facility] [place], [place]   11/25/2017, 9:44 AM

## 2017-11-26 LAB — CBC
HEMOGLOBIN: 12.2 g/dL (ref 11.1–15.9)
Hematocrit: 36.6 % (ref 34.0–46.6)
MCH: 34.2 pg — AB (ref 26.6–33.0)
MCHC: 33.3 g/dL (ref 31.5–35.7)
MCV: 103 fL — ABNORMAL HIGH (ref 79–97)
Platelets: 276 10*3/uL (ref 150–450)
RBC: 3.57 x10E6/uL — ABNORMAL LOW (ref 3.77–5.28)
RDW: 14.6 % (ref 12.3–15.4)
WBC: 5.1 10*3/uL (ref 3.4–10.8)

## 2017-11-26 LAB — LIPID PANEL
Chol/HDL Ratio: 2.1 ratio (ref 0.0–4.4)
Cholesterol, Total: 249 mg/dL — ABNORMAL HIGH (ref 100–199)
HDL: 120 mg/dL (ref >39)
LDL CALC: 107 mg/dL — AB (ref 0–99)
Triglycerides: 110 mg/dL (ref 0–149)
VLDL CHOLESTEROL CAL: 22 mg/dL (ref 5–40)

## 2017-11-26 LAB — CMP14+EGFR
ALBUMIN: 4.4 g/dL (ref 3.6–4.8)
ALT: 27 IU/L (ref 0–32)
AST: 70 IU/L — ABNORMAL HIGH (ref 0–40)
Albumin/Globulin Ratio: 1.4 (ref 1.2–2.2)
Alkaline Phosphatase: 89 IU/L (ref 39–117)
BUN / CREAT RATIO: 6 — AB (ref 12–28)
BUN: 6 mg/dL — AB (ref 8–27)
Bilirubin Total: 0.3 mg/dL (ref 0.0–1.2)
CO2: 18 mmol/L — AB (ref 20–29)
CREATININE: 0.94 mg/dL (ref 0.57–1.00)
Calcium: 9.5 mg/dL (ref 8.7–10.3)
Chloride: 103 mmol/L (ref 96–106)
GFR calc non Af Amer: 66 mL/min/{1.73_m2} (ref >59)
GFR, EST AFRICAN AMERICAN: 76 mL/min/{1.73_m2} (ref >59)
GLUCOSE: 87 mg/dL (ref 65–99)
Globulin, Total: 3.1 g/dL (ref 1.5–4.5)
Potassium: 4.9 mmol/L (ref 3.5–5.2)
Sodium: 143 mmol/L (ref 134–144)
Total Protein: 7.5 g/dL (ref 6.0–8.5)

## 2017-11-26 LAB — MAGNESIUM: MAGNESIUM: 1.6 mg/dL (ref 1.6–2.3)

## 2017-12-03 ENCOUNTER — Telehealth: Payer: Self-pay

## 2017-12-03 NOTE — Telephone Encounter (Signed)
CMA attempt to call patient to inform on lab results.  No answer and unable to leave a VM due to no mailbox.  If patient call back, please inform:  Magnesium is low normal. Continue on magnesium and potassium for the next 2 months. Other Labs are essentially normal except for total cholesterol and LDL cholesterol. Make sure you are drinking at least 48 oz of water per day. Work on eating a low fat, heart healthy diet and participate in regular aerobic exercise program to control as well. Exercise at least  30 minutes per day-5 days per week. Avoid red meat. No fried foods. No junk foods, sodas, sugary foods or drinks, unhealthy snacking, alcohol or smoking.  You can also take Omega 3 OTC 2 capsules per day to help lower your cholesterol levels   A Letter will be send out.

## 2017-12-03 NOTE — Telephone Encounter (Signed)
-----   Message from Gildardo Pounds, NP sent at 11/26/2017  8:44 AM EDT ----- Magnesium is low normal. Continue on magnesium and potassium for the next 2 months. Other Labs are essentially normal except for total cholesterol and LDL cholesterol. Make sure you are drinking at least 48 oz of water per day. Work on eating a low fat, heart healthy diet and participate in regular aerobic exercise program to control as well. Exercise at least  30 minutes per day-5 days per week. Avoid red meat. No fried foods. No junk foods, sodas, sugary foods or drinks, unhealthy snacking, alcohol or smoking.  You can also take Omega 3 OTC 2 capsules per day to help lower your cholesterol levels

## 2017-12-06 ENCOUNTER — Other Ambulatory Visit: Payer: Self-pay | Admitting: Obstetrics and Gynecology

## 2017-12-06 DIAGNOSIS — Z1231 Encounter for screening mammogram for malignant neoplasm of breast: Secondary | ICD-10-CM

## 2017-12-13 ENCOUNTER — Telehealth: Payer: Self-pay | Admitting: Nurse Practitioner

## 2017-12-13 NOTE — Telephone Encounter (Signed)
Patient returned call regarding results. Patient states she can get reached at her job at [phone]

## 2017-12-16 NOTE — Telephone Encounter (Signed)
Patient called and requested results. Patient was informed and verbalized understanding. Patient had no further questions.

## 2018-01-07 ENCOUNTER — Ambulatory Visit (HOSPITAL_COMMUNITY): Payer: Self-pay

## 2018-01-10 ENCOUNTER — Telehealth (HOSPITAL_COMMUNITY): Payer: Self-pay

## 2018-01-10 NOTE — Telephone Encounter (Signed)
Tried to call mailbox was full °

## 2018-01-15 ENCOUNTER — Telehealth (HOSPITAL_COMMUNITY): Payer: Self-pay | Admitting: *Deleted

## 2018-01-15 NOTE — Telephone Encounter (Signed)
Telephoned patient at home number and patient is not available.

## 2018-01-31 ENCOUNTER — Telehealth (HOSPITAL_COMMUNITY): Payer: Self-pay

## 2018-01-31 NOTE — Telephone Encounter (Signed)
Tried to call mailbox was full could not leave a message

## 2018-03-07 ENCOUNTER — Ambulatory Visit: Payer: Self-pay | Admitting: Neurology

## 2018-03-10 ENCOUNTER — Other Ambulatory Visit: Payer: Self-pay

## 2018-03-10 ENCOUNTER — Emergency Department (HOSPITAL_COMMUNITY): Payer: Self-pay

## 2018-03-10 ENCOUNTER — Encounter (HOSPITAL_COMMUNITY): Payer: Self-pay

## 2018-03-10 ENCOUNTER — Emergency Department (HOSPITAL_COMMUNITY)
Admission: EM | Admit: 2018-03-10 | Discharge: 2018-03-10 | Disposition: A | Discharge status: Home / Self Care | Payer: Self-pay | Attending: Emergency Medicine | Admitting: Emergency Medicine

## 2018-03-10 DIAGNOSIS — Y92830 Public park as the place of occurrence of the external cause: Secondary | ICD-10-CM | POA: Insufficient documentation

## 2018-03-10 DIAGNOSIS — S71152A Open bite, left thigh, initial encounter: Secondary | ICD-10-CM | POA: Insufficient documentation

## 2018-03-10 DIAGNOSIS — Y93K1 Activity, walking an animal: Secondary | ICD-10-CM | POA: Insufficient documentation

## 2018-03-10 DIAGNOSIS — Z23 Encounter for immunization: Secondary | ICD-10-CM | POA: Insufficient documentation

## 2018-03-10 DIAGNOSIS — Y998 Other external cause status: Secondary | ICD-10-CM | POA: Insufficient documentation

## 2018-03-10 DIAGNOSIS — W540XXA Bitten by dog, initial encounter: Secondary | ICD-10-CM | POA: Insufficient documentation

## 2018-03-10 MED ORDER — IBUPROFEN 200 MG PO TABS
400.0000 mg | ORAL_TABLET | Freq: Once | ORAL | Status: AC
  Administered 2018-03-10: 400 mg via ORAL
  Filled 2018-03-10: qty 2

## 2018-03-10 MED ORDER — ACETAMINOPHEN 500 MG PO TABS
1000.0000 mg | ORAL_TABLET | Freq: Once | ORAL | Status: DC
  Filled 2018-03-10: qty 2

## 2018-03-10 MED ORDER — TETANUS-DIPHTH-ACELL PERTUSSIS 5-2.5-18.5 LF-MCG/0.5 IM SUSP
0.5000 mL | Freq: Once | INTRAMUSCULAR | Status: AC
  Administered 2018-03-10: 0.5 mL via INTRAMUSCULAR
  Filled 2018-03-10: qty 0.5

## 2018-03-10 MED ORDER — AMOXICILLIN-POT CLAVULANATE 875-125 MG PO TABS
1.0000 | ORAL_TABLET | Freq: Once | ORAL | Status: AC
  Administered 2018-03-10: 1 via ORAL
  Filled 2018-03-10: qty 1

## 2018-03-10 MED ORDER — BACITRACIN ZINC 500 UNIT/GM EX OINT
TOPICAL_OINTMENT | Freq: Once | CUTANEOUS | Status: AC
  Administered 2018-03-10: 1 via TOPICAL
  Filled 2018-03-10: qty 1.8

## 2018-03-10 MED ORDER — AMOXICILLIN-POT CLAVULANATE 875-125 MG PO TABS: 1.0000 | ORAL_TABLET | Freq: Two times a day (BID) | ORAL | 0 refills | Status: DC

## 2018-03-10 NOTE — ED Provider Notes (Signed)
[facility] DEPT Provider Note   CSN: [id number] Arrival date & time: 03/10/18  1239     History   Chief Complaint Chief Complaint  Patient presents with  . Animal Bite    HPI Christie Morrison is a 62 y.o. female.  Christie Morrison is a 62 y.o. Female with a history of cirrhosis and cocaine abuse, who presents to the emergency department for EMS evaluation after a dog bite.  Patient reports that she was walking through [facility] when a dog got away from its own or on the leash and attacked her biting her on the back of the left thigh, she denies any other bites there are some small scratches and swelling surrounding this area.  Pain is a constant ache.  She is able to walk without difficulty.  She denies any numbness, weakness or tingling in her leg.  No discoloration.  She reports the wound initially bled but bleeding is now controlled.  She is unsure of her last tetanus vaccination.  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     Past Medical History:  Diagnosis Date  . Acute hepatitis   . Cirrhosis ([place])   . Crack cocaine use   . Duodenitis   . ETOH abuse   . High cholesterol   . Mallory-Weiss tear     Patient Active Problem List   Diagnosis Date Noted  . Cirrhosis, alcoholic ([place])   . Acute hepatitis   . Dehydration, severe 06/08/2016  . Elevated lactic acid level 06/08/2016  . Alcohol abuse 06/08/2016  . Melena 06/08/2016  . Cocaine abuse ([place]) 06/08/2016  . Thrombocytopenia ([place]) 06/08/2016  . Acute hypernatremia 06/08/2016  . Acute alcohol intoxication ([place]) 06/08/2016    Past Surgical History:  Procedure Laterality Date  . CESAREAN SECTION       OB History   None      Home Medications    Prior to Admission medications   Medication Sig Start Date End Date Taking? Authorizing Provider  amoxicillin-clavulanate (AUGMENTIN) 875-125 MG tablet Take 1 tablet by mouth 2 (two)  times daily. One po bid x 7 days 03/10/18   Jacqlyn Larsen, PA-C  magnesium oxide (MAG-OX) 400 MG tablet Take 1 tablet (400 mg total) by mouth 2 (two) times daily. 11/25/17   Gildardo Pounds, NP  pantoprazole (PROTONIX) 40 MG tablet Take 1 tablet (40 mg total) by mouth daily. 11/25/17 02/23/18  Gildardo Pounds, NP  potassium chloride SA (K-DUR,KLOR-CON) 20 MEQ tablet Take 1 tablet (20 mEq total) by mouth daily. 11/25/17   Gildardo Pounds, NP    Family History Family History  Problem Relation Age of Onset  . Diabetes Mellitus II Mother   . Kidney failure Mother   . Alcohol abuse Mother   . Diabetes Mellitus II Brother   . Kidney failure Brother   . Alcohol abuse Maternal Grandmother     Social History Social History   Tobacco Use  . Smoking status: Never Smoker  . Smokeless tobacco: Never Used  Substance Use Topics  . Alcohol use: Yes    Comment: occasionally   . Drug use: Not Currently    Frequency: 1.0 times per week    Types: Cocaine     Allergies   Patient has no known allergies.   Review of Systems Review of Systems  Constitutional: Negative for chills and fever.  Gastrointestinal: Negative for nausea and vomiting.  Musculoskeletal: Positive for  myalgias. Negative for arthralgias and joint swelling.  Skin: Positive for wound. Negative for color change.  Neurological: Negative for weakness and numbness.     Physical Exam Updated Vital Signs BP 126/81 (BP Location: Left Arm)   Pulse 98   Temp 99.1 F (37.3 C) (Oral)   Resp 18   Ht 5\' 10"  (1.778 m)   Wt 63.5 kg   SpO2 95%   BMI 20.09 kg/m   Physical Exam  Constitutional: She appears well-developed and well-nourished. No distress.  HENT:  Head: Normocephalic and atraumatic.  Eyes: Right eye exhibits no discharge. Left eye exhibits no discharge.  Pulmonary/Chest: Effort normal. No respiratory distress.  Musculoskeletal:  1.5 cm puncture wound to the posterior left thigh, no active bleeding, there is some  surrounding soft tissue swelling, distal pulses intact, ambulatory without difficulty, good capillary refill and normal sensation and strength.  Neurological: She is alert. Coordination normal.  Skin: Skin is warm and dry. She is not diaphoretic.  Puncture wound as described above, no evidence of other wounds  Psychiatric: She has a normal mood and affect. Her behavior is normal.  Nursing note and vitals reviewed.    ED Treatments / Results  Labs (all labs ordered are listed, but only abnormal results are displayed) Labs Reviewed - No data to display  EKG None  Radiology Dg Femur Min 2 Views Left  Result Date: 03/10/2018 CLINICAL DATA:  Dog bite EXAM: LEFT FEMUR 2 VIEWS COMPARISON:  None. FINDINGS: Frontal and lateral views were obtained. No fracture or dislocation. Joint spaces appear unremarkable. No soft tissue air or radiopaque foreign body evident. There is soft tissue swelling posteriorly and medially. There is extensive arterial vascular calcification in the superficial femoral and profunda femoral arteries. IMPRESSION: Soft tissue swelling posteriorly and medially. Question hematoma in this area. No radiopaque foreign body. No fracture or dislocation. No appreciable arthropathy. Extensive arterial vascular calcification noted. Electronically Signed   By: Lowella Grip III M.D.   On: 03/10/2018 14:10    Procedures Procedures (including critical care time)  Medications Ordered in ED Medications  Tdap (BOOSTRIX) injection 0.5 mL (0.5 mLs Intramuscular Given 03/10/18 1417)  amoxicillin-clavulanate (AUGMENTIN) 875-125 MG per tablet 1 tablet (1 tablet Oral Given 03/10/18 1416)  bacitracin ointment (1 application Topical Given 03/10/18 1417)  ibuprofen (ADVIL,MOTRIN) tablet 400 mg (400 mg Oral Given 03/10/18 1416)     Initial Impression / Assessment and Plan / ED Course  I have reviewed the triage vital signs and the nursing notes.  Pertinent labs & imaging results that  were available during my care of the patient were reviewed by me and considered in my medical decision making (see chart for details).   Patient presents with laceration from a dog bite. Pt wounds irrigated well with 18ga angiocath with sterile saline. Wounds examined with visualization of the base and no foreign bodies seen. Pt Alert and oriented, NAD, nontoxic, nonseptic appearing. Capillary refill intact and pt without neurologic deficit. X-rays with no evidence of fracture or foreign body there is some soft tissue swelling and signs of possible hematoma. Patient tetanus updated. Patient rabies vaccine and immunoglobulin risk and benefit discussed, luckily dog is up to date on rabies vaccination. Pain treated in the emergency department. Wounds not closed secondary to concern for infection, feel the wound is fairly small and should heal well on its own. Will discharge home with Augmentin and advised on signs of worsening infection and close follow-up with PCP or back in the ER.  Appropriate return precautions discussed and patient expresses understanding and is in agreement with plan. Stable for discharge home at this time.   Final Clinical Impressions(s) / ED Diagnoses   Final diagnoses:  Dog bite, initial encounter    ED Discharge Orders         Ordered    amoxicillin-clavulanate (AUGMENTIN) 875-125 MG tablet  2 times daily     03/10/18 1509           Jacqlyn Larsen, [place] 03/10/18 1511    Virgel Manifold, MD 03/10/18 1546

## 2018-03-10 NOTE — ED Triage Notes (Signed)
[place] was [place] the park and was bitten by a dog in the left thigh-single puncture. EMS reports patient bled a small amount through the dressing and some swelling at the site.

## 2018-03-10 NOTE — ED Notes (Addendum)
Wound care performed, and dressed.

## 2018-03-10 NOTE — Discharge Instructions (Addendum)
Take antibiotics as directed, keep wounds clean and dressed and monitor closely for signs of worsening infection such as redness, swelling, warmth, drainage, fevers or chills. If any of these signs occur please return to the emergency department for reevaluation.  Your tetanus vaccine was updated today.

## 2018-03-31 ENCOUNTER — Ambulatory Visit: Payer: Self-pay | Admitting: Nurse Practitioner

## 2018-03-31 ENCOUNTER — Telehealth (HOSPITAL_COMMUNITY): Payer: Self-pay | Admitting: Obstetrics and Gynecology

## 2018-03-31 NOTE — Telephone Encounter (Signed)
Called patient to reschedule mammogram.  Patient not available; voice mailbox full and could not leave message.

## 2018-06-13 ENCOUNTER — Encounter (HOSPITAL_COMMUNITY): Payer: Self-pay | Admitting: Emergency Medicine

## 2018-06-13 ENCOUNTER — Emergency Department (HOSPITAL_COMMUNITY)
Admission: EM | Admit: 2018-06-13 | Discharge: 2018-06-13 | Disposition: A | Discharge status: Home / Self Care | Payer: Self-pay | Attending: Emergency Medicine | Admitting: Emergency Medicine

## 2018-06-13 ENCOUNTER — Ambulatory Visit (HOSPITAL_COMMUNITY)
Admission: EM | Admit: 2018-06-13 | Discharge: 2018-06-13 | Disposition: A | Discharge status: Home / Self Care | Payer: Self-pay

## 2018-06-13 DIAGNOSIS — R569 Unspecified convulsions: Secondary | ICD-10-CM

## 2018-06-13 DIAGNOSIS — Z79899 Other long term (current) drug therapy: Secondary | ICD-10-CM | POA: Insufficient documentation

## 2018-06-13 HISTORY — DX: Unspecified convulsions: R56.9

## 2018-06-13 LAB — CBC
HEMATOCRIT: 29.9 % — AB (ref 36.0–46.0)
HEMOGLOBIN: 9.9 g/dL — AB (ref 12.0–15.0)
MCH: 33.1 pg (ref 26.0–34.0)
MCHC: 33.1 g/dL (ref 30.0–36.0)
MCV: 100 fL (ref 80.0–100.0)
NRBC: 0.2 % (ref 0.0–0.2)
Platelets: 121 10*3/uL — ABNORMAL LOW (ref 150–400)
RBC: 2.99 MIL/uL — ABNORMAL LOW (ref 3.87–5.11)
RDW: 14.8 % (ref 11.5–15.5)
WBC: 9.8 10*3/uL (ref 4.0–10.5)

## 2018-06-13 LAB — BASIC METABOLIC PANEL
ANION GAP: 14 (ref 5–15)
BUN: 16 mg/dL (ref 8–23)
CHLORIDE: 98 mmol/L (ref 98–111)
CO2: 25 mmol/L (ref 22–32)
Calcium: 8.9 mg/dL (ref 8.9–10.3)
Creatinine, Ser: 1.24 mg/dL — ABNORMAL HIGH (ref 0.44–1.00)
GFR, EST AFRICAN AMERICAN: 54 mL/min — AB (ref >60)
GFR, EST NON AFRICAN AMERICAN: 47 mL/min — AB (ref >60)
Glucose, Bld: 119 mg/dL — ABNORMAL HIGH (ref 70–99)
Potassium: 3.6 mmol/L (ref 3.5–5.1)
Sodium: 137 mmol/L (ref 135–145)

## 2018-06-13 LAB — URINALYSIS, ROUTINE W REFLEX MICROSCOPIC
BILIRUBIN URINE: NEGATIVE
Glucose, UA: NEGATIVE mg/dL
HGB URINE DIPSTICK: NEGATIVE
Ketones, ur: NEGATIVE mg/dL
Nitrite: NEGATIVE
Protein, ur: 30 mg/dL — AB
SPECIFIC GRAVITY, URINE: 1.024 (ref 1.005–1.030)
pH: 5 (ref 5.0–8.0)

## 2018-06-13 LAB — HEPATIC FUNCTION PANEL
ALBUMIN: 4 g/dL (ref 3.5–5.0)
ALT: 35 U/L (ref 0–44)
AST: 158 U/L — ABNORMAL HIGH (ref 15–41)
Alkaline Phosphatase: 83 U/L (ref 38–126)
BILIRUBIN INDIRECT: 0.6 mg/dL (ref 0.3–0.9)
Bilirubin, Direct: 0.3 mg/dL — ABNORMAL HIGH (ref 0.0–0.2)
TOTAL PROTEIN: 7.9 g/dL (ref 6.5–8.1)
Total Bilirubin: 0.9 mg/dL (ref 0.3–1.2)

## 2018-06-13 LAB — MAGNESIUM: MAGNESIUM: 1.2 mg/dL — AB (ref 1.7–2.4)

## 2018-06-13 LAB — RAPID URINE DRUG SCREEN, HOSP PERFORMED
Amphetamines: NOT DETECTED
BARBITURATES: NOT DETECTED
Benzodiazepines: NOT DETECTED
Cocaine: POSITIVE — AB
Opiates: NOT DETECTED
TETRAHYDROCANNABINOL: NOT DETECTED

## 2018-06-13 MED ORDER — MAGNESIUM SULFATE 2 GM/50ML IV SOLN
2.0000 g | Freq: Once | INTRAVENOUS | Status: AC
  Administered 2018-06-13: 2 g via INTRAVENOUS
  Filled 2018-06-13: qty 50

## 2018-06-13 MED ORDER — MAGNESIUM OXIDE 400 MG PO CAPS: 400.0000 mg | ORAL_CAPSULE | Freq: Every day | ORAL | 1 refills | Status: DC

## 2018-06-13 MED ORDER — SODIUM CHLORIDE 0.9 % IV SOLN
INTRAVENOUS | Status: DC
  Administered 2018-06-13: 21:00:00 via INTRAVENOUS

## 2018-06-13 NOTE — ED Notes (Signed)
Patient given [place] sandwich and diet sprite to drink.

## 2018-06-13 NOTE — ED Triage Notes (Signed)
Pt to ER for evaluation of seizures, reports x2 witnessed by work staff and family. Reports grand mal activity. Pt in NAD. Reports recently started having seizures, but is not on a seizure medication.

## 2018-06-13 NOTE — ED Provider Notes (Signed)
[facility] EMERGENCY DEPARTMENT Provider Note   CSN: [id number] Arrival date & time: 06/13/18  1359     History   Chief Complaint Chief Complaint  Patient presents with  . Seizures    HPI Christie Morrison is a 63 y.o. female.  Patient brought in from work.  For 2 reported seizure-like activities.  Patient states the first 1 was at 11:00 this morning and then 1300 this afternoon.  Witnesses [place].  Patient's had a questionable history of seizures in the past.  But there is been some alcohol issues.  Patient not on antiseizure meds.  Patient is also had trouble with hypokalemia as well as hypo-magnesium.  Patient's past medical history is significant for acute hepatitis cirrhosis crack cocaine use and alcohol abuse.  Patient without any complaints here.  States she was not incontinent.  Did not bite her tongue.  Patient has a little bit of soreness up in her shoulders but otherwise feels fine.  Patient is followed by the wellness clinic in the past.     Past Medical History:  Diagnosis Date  . Acute hepatitis   . Cirrhosis ([place])   . Crack cocaine use   . Duodenitis   . ETOH abuse   . High cholesterol   . Mallory-Weiss tear   . Seizures [facility])     Patient Active Problem List   Diagnosis Date Noted  . Cirrhosis, alcoholic ([place])   . Acute hepatitis   . Dehydration, severe 06/08/2016  . Elevated lactic acid level 06/08/2016  . Alcohol abuse 06/08/2016  . Melena 06/08/2016  . Cocaine abuse ([place]) 06/08/2016  . Thrombocytopenia ([place]) 06/08/2016  . Acute hypernatremia 06/08/2016  . Acute alcohol intoxication ([place]) 06/08/2016    Past Surgical History:  Procedure Laterality Date  . CESAREAN SECTION       OB History   No obstetric history on file.      Home Medications    Prior to Admission medications   Medication Sig Start Date End Date Taking? Authorizing Provider  amoxicillin-clavulanate (AUGMENTIN) 875-125 MG tablet  Take 1 tablet by mouth 2 (two) times daily. One po bid x 7 days 03/10/18   Jacqlyn Larsen, PA-C  magnesium oxide (MAG-OX) 400 MG tablet Take 1 tablet (400 mg total) by mouth 2 (two) times daily. 11/25/17   Gildardo Pounds, NP  pantoprazole (PROTONIX) 40 MG tablet Take 1 tablet (40 mg total) by mouth daily. 11/25/17 02/23/18  Gildardo Pounds, NP  potassium chloride SA (K-DUR,KLOR-CON) 20 MEQ tablet Take 1 tablet (20 mEq total) by mouth daily. 11/25/17   Gildardo Pounds, NP    Family History Family History  Problem Relation Age of Onset  . Diabetes Mellitus II Mother   . Kidney failure Mother   . Alcohol abuse Mother   . Diabetes Mellitus II Brother   . Kidney failure Brother   . Alcohol abuse Maternal Grandmother     Social History Social History   Tobacco Use  . Smoking status: Never Smoker  . Smokeless tobacco: Never Used  Substance Use Topics  . Alcohol use: Yes    Comment: occasionally   . Drug use: Not Currently    Frequency: 1.0 times per week    Types: Cocaine     Allergies   Patient has no known allergies.   Review of Systems Review of Systems  Constitutional: Negative for chills and fever.  HENT: Negative for congestion, rhinorrhea and sore throat.  Eyes: Negative for visual disturbance.  Respiratory: Negative for cough and shortness of breath.   Cardiovascular: Negative for chest pain and leg swelling.  Gastrointestinal: Negative for abdominal pain, diarrhea, nausea and vomiting.  Genitourinary: Negative for dysuria.  Musculoskeletal: Negative for back pain and neck pain.  Skin: Negative for rash.  Neurological: Negative for dizziness, light-headedness and headaches.  Hematological: Does not bruise/bleed easily.  Psychiatric/Behavioral: Negative for confusion.     Physical Exam Updated Vital Signs BP (!) 147/77   Pulse (!) 104   Temp 99 F (37.2 C) (Oral)   Resp 16   SpO2 95%   Physical Exam Vitals signs and nursing note reviewed.  Constitutional:       General: She is not in acute distress.    Appearance: She is well-developed.  HENT:     Head: Normocephalic and atraumatic.     Mouth/Throat:     Mouth: Mucous membranes are moist.  Eyes:     Conjunctiva/sclera: Conjunctivae normal.  Neck:     Musculoskeletal: Normal range of motion and neck supple.  Cardiovascular:     Rate and Rhythm: Normal rate and regular rhythm.     Heart sounds: No murmur.  Pulmonary:     Effort: Pulmonary effort is normal. No respiratory distress.     Breath sounds: Normal breath sounds.  Abdominal:     Palpations: Abdomen is soft.     Tenderness: There is no abdominal tenderness.  Musculoskeletal:        General: No swelling.  Skin:    General: Skin is warm and dry.     Capillary Refill: Capillary refill takes less than 2 seconds.  Neurological:     General: No focal deficit present.     Mental Status: She is alert and oriented to person, place, and time.     Cranial Nerves: No cranial nerve deficit.     Sensory: No sensory deficit.     Motor: No weakness.      ED Treatments / Results  Labs (all labs ordered are listed, but only abnormal results are displayed) Labs Reviewed  BASIC METABOLIC PANEL - Abnormal; Notable for the following components:      Result Value   Glucose, Bld 119 (*)    Creatinine, Ser 1.24 (*)    GFR calc non Af Amer 47 (*)    GFR calc Af Amer 54 (*)    All other components within normal limits  CBC - Abnormal; Notable for the following components:   RBC 2.99 (*)    Hemoglobin 9.9 (*)    HCT 29.9 (*)    Platelets 121 (*)    All other components within normal limits  MAGNESIUM - Abnormal; Notable for the following components:   Magnesium 1.2 (*)    All other components within normal limits  HEPATIC FUNCTION PANEL - Abnormal; Notable for the following components:   AST 158 (*)    Bilirubin, Direct 0.3 (*)    All other components within normal limits  URINALYSIS, ROUTINE W REFLEX MICROSCOPIC  RAPID URINE DRUG  SCREEN, HOSP PERFORMED    EKG None  Radiology No results found.  Procedures Procedures (including critical care time)  Medications Ordered in ED Medications  0.9 %  sodium chloride infusion ( Intravenous New Bag/Given 06/13/18 2050)  magnesium sulfate IVPB 2 g 50 mL (2 g Intravenous New Bag/Given 06/13/18 2033)     Initial Impression / Assessment and Plan / ED Course  I have reviewed the triage  vital signs and the nursing notes.  Pertinent labs & imaging results that were available during my care of the patient were reviewed by me and considered in my medical decision making (see chart for details).     Patient with seizure-like activity or perhaps syncopal activity.  That occurred at work.  Patient lab work-up here without significant abnormalities other than a mild AST elevation.  Mild bilirubin elevation.  Patient's electrolytes normal with the exception of creatinine being slightly elevated at 1.24 and patient's magnesium level was a little on the low side.  Said trouble with that in the past.  Patient received 2 mg of magnesium sulfate here.  Patient is felt fine.  She has been eating fine.  No further symptoms.  Patient clinically here is been remained stable throughout her stay.  Patient will be restarted on her magnesium pills.  But this time will just to 400 mg once a day have her follow back up with the wellness clinic.  Patient is EKG without any abnormalities.  Not clear whether there was seizure activity or syncopal activity.  Patient's urine drug screen here was positive for cocaine.  Liver function test without significant abnormalities.  Patient's potassium here today was normal.  Final Clinical Impressions(s) / ED Diagnoses   Final diagnoses:  Seizure-like activity [facility])    ED Discharge Orders    None       Fredia Sorrow, MD 06/13/18 2255

## 2018-06-13 NOTE — ED Notes (Signed)
Sent to ER by Dr. Meda Coffee

## 2018-06-13 NOTE — ED Notes (Signed)
Patient made aware that we need a urine specimen.

## 2018-06-13 NOTE — Discharge Instructions (Signed)
Restart your magnesium tablets.  Make an appointment to follow back up with the wellness clinic.  Information provided above.  Call on Monday for an appointment.  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  Return for any new or worse symptoms.  Today potassium was normal.

## 2018-06-17 ENCOUNTER — Emergency Department (HOSPITAL_COMMUNITY)
Admission: EM | Admit: 2018-06-17 | Discharge: 2018-06-18 | Disposition: A | Discharge status: Home / Self Care | Payer: Self-pay | Attending: Emergency Medicine | Admitting: Emergency Medicine

## 2018-06-17 ENCOUNTER — Encounter (HOSPITAL_COMMUNITY): Payer: Self-pay

## 2018-06-17 DIAGNOSIS — Z79899 Other long term (current) drug therapy: Secondary | ICD-10-CM | POA: Insufficient documentation

## 2018-06-17 LAB — BASIC METABOLIC PANEL
Anion gap: 17 — ABNORMAL HIGH (ref 5–15)
BUN: 7 mg/dL — AB (ref 8–23)
CHLORIDE: 95 mmol/L — AB (ref 98–111)
CO2: 24 mmol/L (ref 22–32)
Calcium: 9.3 mg/dL (ref 8.9–10.3)
Creatinine, Ser: 1.21 mg/dL — ABNORMAL HIGH (ref 0.44–1.00)
GFR calc non Af Amer: 48 mL/min — ABNORMAL LOW (ref >60)
GFR, EST AFRICAN AMERICAN: 56 mL/min — AB (ref >60)
Glucose, Bld: 149 mg/dL — ABNORMAL HIGH (ref 70–99)
POTASSIUM: 3.7 mmol/L (ref 3.5–5.1)
SODIUM: 136 mmol/L (ref 135–145)

## 2018-06-17 LAB — MAGNESIUM: Magnesium: 1.1 mg/dL — ABNORMAL LOW (ref 1.7–2.4)

## 2018-06-17 NOTE — ED Triage Notes (Signed)
Patient arrived by [facility] and states that she needs another magnesium infusion. Was here this past Friday for same and received tx. Today aching and back pain and states that how she knows her level is low

## 2018-06-18 MED ORDER — MAGNESIUM OXIDE -MG SUPPLEMENT 400 (240 MG) MG PO TABS: 1.0000 | ORAL_TABLET | Freq: Every day | ORAL | 0 refills | Status: DC

## 2018-06-18 MED ORDER — MAGNESIUM SULFATE 2 GM/50ML IV SOLN
2.0000 g | Freq: Once | INTRAVENOUS | Status: AC
  Administered 2018-06-18: 2 g via INTRAVENOUS
  Filled 2018-06-18: qty 50

## 2018-06-18 MED ORDER — ACETAMINOPHEN 325 MG PO TABS
650.0000 mg | ORAL_TABLET | Freq: Once | ORAL | Status: AC
  Administered 2018-06-18: 650 mg via ORAL
  Filled 2018-06-18: qty 2

## 2018-06-18 NOTE — ED Notes (Signed)
Gave pt.happymeal with soft drink

## 2018-06-18 NOTE — ED Provider Notes (Signed)
[facility] EMERGENCY DEPARTMENT Provider Note   CSN: [id number] Arrival date & time: 06/17/18  1749     History   Chief Complaint No chief complaint on file.   HPI Christie Morrison is a 63 y.o. female.  The history is provided by the patient.  Illness  Location:  Low magnesium Quality:  Chronic Severity:  Moderate Onset quality:  Gradual Timing:  Constant Progression:  Unchanged Chronicity:  Chronic Context:  Alcoholic with poor nutrition Relieved by:  Nothing Worsened by:  Lifestyle Associated symptoms: fatigue   Associated symptoms: no abdominal pain, no chest pain, no fever, no loss of consciousness, no myalgias, no nausea and no shortness of breath     Past Medical History:  Diagnosis Date  . Acute hepatitis   . Cirrhosis ([place])   . Crack cocaine use   . Duodenitis   . ETOH abuse   . High cholesterol   . Mallory-Weiss tear   . Seizures [facility])     Patient Active Problem List   Diagnosis Date Noted  . Cirrhosis, alcoholic ([place])   . Acute hepatitis   . Dehydration, severe 06/08/2016  . Elevated lactic acid level 06/08/2016  . Alcohol abuse 06/08/2016  . Melena 06/08/2016  . Cocaine abuse ([place]) 06/08/2016  . Thrombocytopenia ([place]) 06/08/2016  . Acute hypernatremia 06/08/2016  . Acute alcohol intoxication ([place]) 06/08/2016    Past Surgical History:  Procedure Laterality Date  . CESAREAN SECTION       OB History   No obstetric history on file.      Home Medications    Prior to Admission medications   Medication Sig Start Date End Date Taking? Authorizing Provider  potassium chloride SA (K-DUR,KLOR-CON) 20 MEQ tablet Take 1 tablet (20 mEq total) by mouth daily. 11/25/17  Yes Gildardo Pounds, NP  amoxicillin-clavulanate (AUGMENTIN) 875-125 MG tablet Take 1 tablet by mouth 2 (two) times daily. One po bid x 7 days Patient not taking: Reported on 06/18/2018 03/10/18   Jacqlyn Larsen, PA-C  magnesium oxide (MAG-OX) 400 MG tablet Take  1 tablet (400 mg total) by mouth 2 (two) times daily. Patient not taking: Reported on 06/18/2018 11/25/17   Gildardo Pounds, NP  Magnesium Oxide 400 MG CAPS Take 1 capsule (400 mg total) by mouth daily. 06/13/18   Fredia Sorrow, MD  pantoprazole (PROTONIX) 40 MG tablet Take 1 tablet (40 mg total) by mouth daily. Patient not taking: Reported on 06/18/2018 11/25/17 06/18/26  Gildardo Pounds, NP    Family History Family History  Problem Relation Age of Onset  . Diabetes Mellitus II Mother   . Kidney failure Mother   . Alcohol abuse Mother   . Diabetes Mellitus II Brother   . Kidney failure Brother   . Alcohol abuse Maternal Grandmother     Social History Social History   Tobacco Use  . Smoking status: Never Smoker  . Smokeless tobacco: Never Used  Substance Use Topics  . Alcohol use: Yes    Comment: occasionally   . Drug use: Not Currently    Frequency: 1.0 times per week    Types: Cocaine     Allergies   Patient has no known allergies.   Review of Systems Review of Systems  Constitutional: Positive for fatigue. Negative for fever.  Respiratory: Negative for shortness of breath.   Cardiovascular: Negative for chest pain, palpitations and leg swelling.  Gastrointestinal: Negative for abdominal pain and nausea.  Musculoskeletal: Negative for myalgias.  Neurological: Negative for loss of consciousness.  All other systems reviewed and are negative.    Physical Exam Updated Vital Signs BP 120/84   Pulse 81   Temp 98.4 F (36.9 C) (Oral)   Resp 14   SpO2 100%   Physical Exam Vitals signs and nursing note reviewed.  Constitutional:      General: She is not in acute distress.    Appearance: Normal appearance.  HENT:     Head: Normocephalic and atraumatic.     Nose: Nose normal.     Mouth/Throat:     Mouth: Mucous membranes are moist.     Pharynx: Oropharynx is clear.  Eyes:     Conjunctiva/sclera: Conjunctivae normal.     Pupils: Pupils are equal, round, and  reactive to light.  Neck:     Musculoskeletal: Normal range of motion and neck supple.  Cardiovascular:     Rate and Rhythm: Normal rate and regular rhythm.     Pulses: Normal pulses.     Heart sounds: Normal heart sounds.  Pulmonary:     Effort: Pulmonary effort is normal.     Breath sounds: Normal breath sounds.  Abdominal:     General: Abdomen is flat. Bowel sounds are normal.     Tenderness: There is no abdominal tenderness.  Musculoskeletal: Normal range of motion.  Skin:    General: Skin is warm and dry.     Capillary Refill: Capillary refill takes less than 2 seconds.  Neurological:     General: No focal deficit present.     Mental Status: She is alert and oriented to person, place, and time.  Psychiatric:        Mood and Affect: Mood normal.        Behavior: Behavior normal.      ED Treatments / Results  Labs (all labs ordered are listed, but only abnormal results are displayed) Labs Reviewed  MAGNESIUM - Abnormal; Notable for the following components:      Result Value   Magnesium 1.1 (*)    All other components within normal limits  BASIC METABOLIC PANEL - Abnormal; Notable for the following components:   Chloride 95 (*)    Glucose, Bld 149 (*)    BUN 7 (*)    Creatinine, Ser 1.21 (*)    GFR calc non Af Amer 48 (*)    GFR calc Af Amer 56 (*)    Anion gap 17 (*)    All other components within normal limits    EKG None  Radiology No results found.  Procedures Procedures (including critical care time)  Medications Ordered in ED Medications  magnesium sulfate IVPB 2 g 50 mL (2 g Intravenous New Bag/Given 06/18/18 0212)      Final Clinical Impressions(s) / ED Diagnoses   Musdt improve her diet and take PO magnesium.  The ED cannot continue to do infusions.  Follow up with your PMD.  RX for magnesium given    Return for pain, intractable cough, productive cough,fevers >100.4 unrelieved by medication, shortness of breath, intractable vomiting,  or diarrhea, abdominal pain, Inability to tolerate liquids or food, cough, altered mental status or any concerns. No signs of systemic illness or infection. The patient is nontoxic-appearing on exam and vital signs are within normal limits.   I have reviewed the triage vital signs and the nursing notes. Pertinent labs &imaging results that were available during my care of the patient were reviewed by me and considered in my medical  decision making (see chart for details).  After history, exam, and medical workup I feel the patient has been appropriately medically screened and is safe for discharge home. Pertinent diagnoses were discussed with the patient. Patient was given return precautions.    Kathline Banbury, MD 06/18/18 9914

## 2018-06-25 NOTE — Progress Notes (Signed)
Patient ID: Christie Morrison, female   DOB: [birth year], 63 y.o.   MRN: [id number]      Christie Morrison, is a 63 y.o. female  [id number]:[id number]  [id number]  DOB - [birth year]  Subjective:  Chief Complaint and HPI: Christie Morrison is a 63 y.o. female here today for a follow up visit After being seen in the ED recently with seizure like activity and magnesium of 1.1.  She has not had further seizure activity.  She is not drinking alcohol or taking any substances.  No abdominal pain.  Not taking magnesium supplement.   ED/Hospital notes reviewed.    ROS:   Constitutional:  No f/c, No night sweats, No unexplained weight loss. EENT:  No vision changes, No blurry vision, No hearing changes. No mouth, throat, or ear problems.  Respiratory: No cough, No SOB Cardiac: No CP, no palpitations GI:  No abd pain, No N/V/D. GU: No Urinary s/sx Musculoskeletal: No joint pain Neuro: No headache, no dizziness, no motor weakness.  Skin: No rash Endocrine:  No polydipsia. No polyuria.  Psych: Denies SI/HI  No problems updated.  ALLERGIES: No Known Allergies  PAST MEDICAL HISTORY: Past Medical History:  Diagnosis Date  . Acute hepatitis   . Cirrhosis ([place])   . Crack cocaine use   . Duodenitis   . ETOH abuse   . High cholesterol   . Mallory-Weiss tear   . Seizures ([place])     MEDICATIONS AT HOME: Prior to Admission medications   Medication Sig Start Date End Date Taking? Authorizing Provider  amoxicillin-clavulanate (AUGMENTIN) 875-125 MG tablet Take 1 tablet by mouth 2 (two) times daily. One po bid x 7 days Patient not taking: Reported on 06/18/2018 03/10/18   Christie Larsen, PA-C  magnesium oxide (MAG-OX) 400 MG tablet Take 1 tablet (400 mg total) by mouth 2 (two) times daily. Patient not taking: Reported on 06/18/2018 11/25/17   Christie Pounds, NP  Magnesium Oxide 400 (240 Mg) MG TABS Take 1 tablet (400 mg total) by mouth daily. Patient not taking: Reported on 06/26/2018 06/18/18   Palumbo,  April, MD  Magnesium Oxide 400 MG CAPS Take 1 capsule (400 mg total) by mouth daily. Patient not taking: Reported on 06/26/2018 06/13/18   Christie Sorrow, MD  pantoprazole (PROTONIX) 40 MG tablet Take 1 tablet (40 mg total) by mouth daily. Patient not taking: Reported on 06/18/2018 11/25/17 06/18/26  Christie Pounds, NP  potassium chloride SA (K-DUR,KLOR-CON) 20 MEQ tablet Take 1 tablet (20 mEq total) by mouth daily. Patient not taking: Reported on 06/26/2018 11/25/17   Christie Pounds, NP     Objective:  EXAM:   Vitals:   06/26/18 1440  BP: (!) 153/82  Pulse: 89  Temp: 98.2 F (36.8 C)  TempSrc: Oral  SpO2: 100%  Weight: 137 lb 9.6 oz (62.4 kg)  Height: 5\' 10"  (1.778 m)    General appearance : A&OX3. NAD. Non-toxic-appearing HEENT: Atraumatic and Normocephalic.  PERRLA. EOM intact.  Neck: supple, no JVD. No cervical lymphadenopathy. No thyromegaly Chest/Lungs:  Breathing-non-labored, Good air entry bilaterally, breath sounds normal without rales, rhonchi, or wheezing  CVS: S1 S2 regular, no murmurs, gallops, rubs  Extremities: Bilateral Lower Ext shows no edema, both legs are warm to touch with = pulse throughout Neurology:  CN II-XII grossly intact, Non focal.   Psych:  TP linear. J/I WNL. Normal speech. Appropriate eye contact and affect.  Skin:  No Rash  Data Review No results found for: HGBA1C  Assessment & Plan   1. Thrombocytopenia (HCC) - CBC with Differential/Platelet  2. Alcoholic cirrhosis of liver without ascites ([place]) I have counseled the patient at length about substance abuse and addiction.  12 step meetings/recovery recommended.  Local 12 step meeting lists were given and attendance was encouraged.  Patient expresses understanding.  - Comprehensive metabolic panel  3. Hypomagnesemia - Magnesium  4. Seizure-like activity (HCC) No further seizure activity  5. Elevated LFTs - Comprehensive metabolic panel  6. Encounter for examination following treatment  at hospital Much improved.   Patient have been counseled extensively about nutrition and exercise  Return in about 3 months (around 09/24/2018) for [place] for ongoing health issues.  The patient was given clear instructions to go to ER or return to medical center if symptoms don't improve, worsen or new problems develop. The patient verbalized understanding. The patient was told to call to get lab results if they haven't heard anything in the next week.     Christie Caldron, PA-C [facility] and Christie Morrison, [place]   06/26/2018, 2:54 PM

## 2018-06-26 ENCOUNTER — Ambulatory Visit: Payer: Self-pay | Attending: Nurse Practitioner | Admitting: Physician Assistant

## 2018-06-26 VITALS — BP 153/82 | HR 89 | Temp 98.2°F | Ht 70.0 in | Wt 137.6 lb

## 2018-06-26 DIAGNOSIS — R945 Abnormal results of liver function studies: Secondary | ICD-10-CM

## 2018-06-26 DIAGNOSIS — R569 Unspecified convulsions: Secondary | ICD-10-CM

## 2018-06-26 DIAGNOSIS — D696 Thrombocytopenia, unspecified: Secondary | ICD-10-CM

## 2018-06-26 DIAGNOSIS — R7989 Other specified abnormal findings of blood chemistry: Secondary | ICD-10-CM

## 2018-06-26 DIAGNOSIS — Z09 Encounter for follow-up examination after completed treatment for conditions other than malignant neoplasm: Secondary | ICD-10-CM

## 2018-06-26 DIAGNOSIS — K7030 Alcoholic cirrhosis of liver without ascites: Secondary | ICD-10-CM

## 2018-06-26 NOTE — Patient Instructions (Signed)
Do not drink alcohol.  AA meeting schedule provided and will be supportive of sobriety

## 2018-09-22 ENCOUNTER — Emergency Department (HOSPITAL_COMMUNITY)
Admission: EM | Admit: 2018-09-22 | Discharge: 2018-09-22 | Disposition: A | Discharge status: Home / Self Care | Payer: Self-pay | Attending: Emergency Medicine | Admitting: Emergency Medicine

## 2018-09-22 ENCOUNTER — Emergency Department (HOSPITAL_COMMUNITY): Payer: Self-pay

## 2018-09-22 ENCOUNTER — Encounter (HOSPITAL_COMMUNITY): Payer: Self-pay | Admitting: Radiology

## 2018-09-22 ENCOUNTER — Other Ambulatory Visit: Payer: Self-pay

## 2018-09-22 DIAGNOSIS — Z79899 Other long term (current) drug therapy: Secondary | ICD-10-CM | POA: Insufficient documentation

## 2018-09-22 DIAGNOSIS — R531 Weakness: Secondary | ICD-10-CM | POA: Insufficient documentation

## 2018-09-22 DIAGNOSIS — E860 Dehydration: Secondary | ICD-10-CM | POA: Insufficient documentation

## 2018-09-22 DIAGNOSIS — N390 Urinary tract infection, site not specified: Secondary | ICD-10-CM | POA: Insufficient documentation

## 2018-09-22 DIAGNOSIS — F1490 Cocaine use, unspecified, uncomplicated: Secondary | ICD-10-CM | POA: Insufficient documentation

## 2018-09-22 LAB — PROTIME-INR
INR: 1.1 (ref 0.8–1.2)
Prothrombin Time: 14.1 seconds (ref 11.4–15.2)

## 2018-09-22 LAB — TROPONIN I: Troponin I: <0.03 ng/mL (ref <0.03)

## 2018-09-22 LAB — URINALYSIS, ROUTINE W REFLEX MICROSCOPIC
Glucose, UA: 50 mg/dL — AB
Ketones, ur: 20 mg/dL — AB
Nitrite: NEGATIVE
Protein, ur: >=300 mg/dL — AB
Specific Gravity, Urine: 1.023 (ref 1.005–1.030)
WBC, UA: >50 WBC/hpf — ABNORMAL HIGH (ref 0–5)
pH: 5 (ref 5.0–8.0)

## 2018-09-22 LAB — DIFFERENTIAL
Abs Immature Granulocytes: 0.14 10*3/uL — ABNORMAL HIGH (ref 0.00–0.07)
Basophils Absolute: 0.1 10*3/uL (ref 0.0–0.1)
Basophils Relative: 0 %
Eosinophils Absolute: 0.1 10*3/uL (ref 0.0–0.5)
Eosinophils Relative: 1 %
Immature Granulocytes: 1 %
Lymphocytes Relative: 8 %
Lymphs Abs: 1.6 10*3/uL (ref 0.7–4.0)
Monocytes Absolute: 0.8 10*3/uL (ref 0.1–1.0)
Monocytes Relative: 4 %
Neutro Abs: 16.2 10*3/uL — ABNORMAL HIGH (ref 1.7–7.7)
Neutrophils Relative %: 86 %

## 2018-09-22 LAB — CBC
HCT: 33 % — ABNORMAL LOW (ref 36.0–46.0)
Hemoglobin: 11.2 g/dL — ABNORMAL LOW (ref 12.0–15.0)
MCH: 34.7 pg — ABNORMAL HIGH (ref 26.0–34.0)
MCHC: 33.9 g/dL (ref 30.0–36.0)
MCV: 102.2 fL — ABNORMAL HIGH (ref 80.0–100.0)
Platelets: 156 10*3/uL (ref 150–400)
RBC: 3.23 MIL/uL — ABNORMAL LOW (ref 3.87–5.11)
RDW: 14.6 % (ref 11.5–15.5)
WBC: 18.8 10*3/uL — ABNORMAL HIGH (ref 4.0–10.5)
nRBC: 0.2 % (ref 0.0–0.2)

## 2018-09-22 LAB — RAPID URINE DRUG SCREEN, HOSP PERFORMED
Amphetamines: NOT DETECTED
Barbiturates: NOT DETECTED
Benzodiazepines: NOT DETECTED
Cocaine: POSITIVE — AB
Opiates: NOT DETECTED
Tetrahydrocannabinol: NOT DETECTED

## 2018-09-22 LAB — COMPREHENSIVE METABOLIC PANEL
ALT: 28 U/L (ref 0–44)
AST: 102 U/L — ABNORMAL HIGH (ref 15–41)
Albumin: 4.2 g/dL (ref 3.5–5.0)
Alkaline Phosphatase: 159 U/L — ABNORMAL HIGH (ref 38–126)
Anion gap: 31 — ABNORMAL HIGH (ref 5–15)
BUN: 8 mg/dL (ref 8–23)
CO2: 16 mmol/L — ABNORMAL LOW (ref 22–32)
Calcium: 8 mg/dL — ABNORMAL LOW (ref 8.9–10.3)
Chloride: 91 mmol/L — ABNORMAL LOW (ref 98–111)
Creatinine, Ser: 1.59 mg/dL — ABNORMAL HIGH (ref 0.44–1.00)
GFR calc Af Amer: 40 mL/min — ABNORMAL LOW (ref >60)
GFR calc non Af Amer: 34 mL/min — ABNORMAL LOW (ref >60)
Glucose, Bld: 157 mg/dL — ABNORMAL HIGH (ref 70–99)
Potassium: 3.8 mmol/L (ref 3.5–5.1)
Sodium: 138 mmol/L (ref 135–145)
Total Bilirubin: 2.5 mg/dL — ABNORMAL HIGH (ref 0.3–1.2)
Total Protein: 7.9 g/dL (ref 6.5–8.1)

## 2018-09-22 LAB — ETHANOL: Alcohol, Ethyl (B): <10 mg/dL (ref <10)

## 2018-09-22 LAB — APTT: aPTT: 26 seconds (ref 24–36)

## 2018-09-22 LAB — CBG MONITORING, ED: Glucose-Capillary: 152 mg/dL — ABNORMAL HIGH (ref 70–99)

## 2018-09-22 LAB — I-STAT CREATININE, ED: Creatinine, Ser: 1.1 mg/dL — ABNORMAL HIGH (ref 0.44–1.00)

## 2018-09-22 MED ORDER — SODIUM CHLORIDE 0.9% FLUSH: 3.0000 mL | Freq: Once | INTRAVENOUS | Status: DC

## 2018-09-22 MED ORDER — FOSFOMYCIN TROMETHAMINE 3 G PO PACK
3.0000 g | PACK | Freq: Once | ORAL | Status: AC
  Administered 2018-09-22: 3 g via ORAL
  Filled 2018-09-22: qty 3

## 2018-09-22 MED ORDER — VITAMIN B-1 100 MG PO TABS: ORAL_TABLET | ORAL | 0 refills | Status: DC

## 2018-09-22 NOTE — ED Notes (Signed)
Pt made aware of the need for urine and was also notified that we might need to do an in and out cath if the pt cannot void.

## 2018-09-22 NOTE — Discharge Instructions (Signed)
As discussed, your evaluation today has been largely reassuring.  But, it is important that you monitor your condition carefully, and do not hesitate to return to the ED if you develop new, or concerning changes in your condition. ? ?Otherwise, please follow-up with your physician for appropriate ongoing care. ? ?

## 2018-09-22 NOTE — ED Notes (Signed)
Pt back from imaging

## 2018-09-22 NOTE — Consult Note (Addendum)
Neurology Consultation  Reason for Consult: Code stroke  Referring Physician: Dr. Vanita Panda  CC: Bilateral weakness right greater than left  History is obtained from: Patient  HPI: Christie Morrison is a 63 y.o. female with history of seizure, Mallory-Weiss tear, high cholesterol, alcohol abuse, crack cocaine use, cirrhosis.  Patient apparently drinks in the morning and per her "I drink a lot".  Today she did not drink as early as she does and was walking to the alcohol store with her son, when she bilaterally felt weak in her legs-right greater than left-and tingling in her right arm and leg.  She sat down due to this.  Police found her sitting down and called EMS.  En route patient stated that the tingling and the weakness improved.  Patient denies doing any cocaine recently, denies any blurred vision, difficulty with vision, tunnel vision, shortness of breath, loss of smell. Also no sensation as though she was going into DTs, and no tremulousness.  ED course: Labs, CT head,   LKW: 1630 tpa given?: no, minimal symptoms Premorbid modified Rankin scale (mRS): 0 NIH stroke score: 5   ROS: A 14 point ROS was performed and is negative except as noted in the HPI.   Past Medical History:  Diagnosis Date  . Acute hepatitis   . Cirrhosis ([place])   . Crack cocaine use   . Duodenitis   . ETOH abuse   . High cholesterol   . Mallory-Weiss tear   . Seizures ([place])     Family History  Problem Relation Age of Onset  . Diabetes Mellitus II Mother   . Kidney failure Mother   . Alcohol abuse Mother   . Diabetes Mellitus II Brother   . Kidney failure Brother   . Alcohol abuse Maternal Grandmother     Social History:   reports that she has never smoked. She has never used smokeless tobacco. She reports current alcohol use. She reports previous drug use. Frequency: 1.00 time per week. Drug: Cocaine.  Medications  Current Facility-Administered Medications:  .  sodium chloride flush (NS) 0.9  % injection 3 mL, 3 mL, Intravenous, Once, Carmin Muskrat, MD  Current Outpatient Medications:  .  amoxicillin-clavulanate (AUGMENTIN) 875-125 MG tablet, Take 1 tablet by mouth 2 (two) times daily. One po bid x 7 days (Patient not taking: Reported on 06/18/2018), Disp: 14 tablet, Rfl: 0 .  magnesium oxide (MAG-OX) 400 MG tablet, Take 1 tablet (400 mg total) by mouth 2 (two) times daily. (Patient not taking: Reported on 06/18/2018), Disp: 60 tablet, Rfl: 4 .  Magnesium Oxide 400 (240 Mg) MG TABS, Take 1 tablet (400 mg total) by mouth daily. (Patient not taking: Reported on 06/26/2018), Disp: 30 tablet, Rfl: 0 .  Magnesium Oxide 400 MG CAPS, Take 1 capsule (400 mg total) by mouth daily. (Patient not taking: Reported on 06/26/2018), Disp: 14 capsule, Rfl: 1 .  pantoprazole (PROTONIX) 40 MG tablet, Take 1 tablet (40 mg total) by mouth daily. (Patient not taking: Reported on 06/18/2018), Disp: 90 tablet, Rfl: 1 .  potassium chloride SA (K-DUR,KLOR-CON) 20 MEQ tablet, Take 1 tablet (20 mEq total) by mouth daily. (Patient not taking: Reported on 06/26/2018), Disp: 30 tablet, Rfl: 1   Exam: Current vital signs: There were no vitals taken for this visit. Vital signs in last 24 hours:    Physical Exam  Constitutional: Appears well-developed and well-nourished.  Psych: Affect appropriate to situation Eyes: No scleral injection HENT: No OP obstrucion Head: Normocephalic.  Cardiovascular:  Normal rate and regular rhythm.  Respiratory: Effort normal, non-labored breathing GI: Soft.  No distension. There is no tenderness.  Skin: WDI  Neuro: Mental Status: Patient is awake, alert, oriented to person, place, month,  situation. Patient is able to give a clear and coherent history. No signs of aphasia or neglect Cranial Nerves: II: Visual Fields are full.  III,IV, VI: EOMI without ptosis or diploplia. Pupils equal, round and reactive to light V: Facial sensation is symmetric to temperature VII: Facial  movement is symmetric.  VIII: hearing is intact to voice X: Palate elevates symmetrically XI: Shoulder shrug is symmetric. XII: tongue is midline without atrophy or fasciculations.  Motor: Left sided strength of 5/5 with right side being 4+/5 with varying effort versus subtle motor deficit.   Sensory: Lower extremity peripheral neuropathic findings with decreased stocking-distribution sensation. No extinction to DSS BUE.  Deep Tendon Reflexes:  No lower extremity deep tendon reflexes. Reflexes are 2+ in the bilateral upper extremities Plantars: Mute bilaterally Cerebellar: Right arm and leg slightly dysmetric with FNF and H-S. Decreased rapid alternating movements on the right.  Labs I have reviewed labs in epic and the results pertinent to this consultation are:   CBC    Component Value Date/Time   WBC 18.8 (H) 09/22/2018 1724   RBC 3.23 (L) 09/22/2018 1724   HGB 11.2 (L) 09/22/2018 1724   HGB 12.2 11/25/2017 0940   HCT 33.0 (L) 09/22/2018 1724   HCT 36.6 11/25/2017 0940   PLT 156 09/22/2018 1724   PLT 276 11/25/2017 0940   MCV 102.2 (H) 09/22/2018 1724   MCV 103 (H) 11/25/2017 0940   MCH 34.7 (H) 09/22/2018 1724   MCHC 33.9 09/22/2018 1724   RDW 14.6 09/22/2018 1724   RDW 14.6 11/25/2017 0940   LYMPHSABS 1.6 09/22/2018 1724   MONOABS 0.8 09/22/2018 1724   EOSABS 0.1 09/22/2018 1724   BASOSABS 0.1 09/22/2018 1724    CMP     Component Value Date/Time   NA 136 06/17/2018 1807   NA 143 11/25/2017 0940   K 3.7 06/17/2018 1807   CL 95 (L) 06/17/2018 1807   CO2 24 06/17/2018 1807   GLUCOSE 149 (H) 06/17/2018 1807   BUN 7 (L) 06/17/2018 1807   BUN 6 (L) 11/25/2017 0940   CREATININE 1.10 (H) 09/22/2018 1728   CALCIUM 9.3 06/17/2018 1807   PROT 7.9 06/13/2018 2012   PROT 7.5 11/25/2017 0940   ALBUMIN 4.0 06/13/2018 2012   ALBUMIN 4.4 11/25/2017 0940   AST 158 (H) 06/13/2018 2012   ALT 35 06/13/2018 2012   ALT BLOOD 06/08/2016 2034   ALKPHOS 83 06/13/2018 2012    BILITOT 0.9 06/13/2018 2012   BILITOT 0.3 11/25/2017 0940   GFRNONAA 48 (L) 06/17/2018 1807   GFRAA 56 (L) 06/17/2018 1807    Lipid Panel     Component Value Date/Time   CHOL 249 (H) 11/25/2017 0940   TRIG 110 11/25/2017 0940   HDL 120 11/25/2017 0940   CHOLHDL 2.1 11/25/2017 0940   LDLCALC 107 (H) 11/25/2017 0940    Imaging I have reviewed the images obtained:  CT-scan of the brain- no evidence of acute intracranial abnormality  MRI examination of the brain-pending  Etta Quill PA-C [facility] [phone] 09/22/2018, 5:34 PM    Assessment:  63 year old female presenting to the hospital secondary to feeling generalized weakness with right slightlly weakner than left side.  1. Currently patient still feels weak on the right side. Subtle weakness on exam  may be effort dependent versus secondary to a small old or new stroke. CT head shows no acute intracranial abnormality.  2. Stroke felt to be relatively low on the DDx. Given subtle deficit on exam as well as relatively high likelihood of a non-thrombotic mechanism in the setting of possible precipitant of crack cocaine abuse (acute cerebral vasospasm), risks of tPA significantly outweigh potential benefits.  3. At risk for EtOH withdrawal as she is a heavy drinker and per history had started drinking much later today than is her usual habit.  Recommendations: - MRI brain. If negative no further stroke work-up. - Urine drug screen - Alcohol level - Start patient on high dose thiamine 500 mg 3 times daily for 3 days and then drop down to 100 mg daily  Electronically signed: Dr. Kerney Elbe

## 2018-09-22 NOTE — ED Provider Notes (Signed)
[facility] EMERGENCY DEPARTMENT Provider Note   CSN: [id number] Arrival date & time: 09/22/18  1715  An emergency department physician performed an initial assessment on this suspected stroke patient at 1715.  History   Chief Complaint Chief Complaint  Patient presents with  . Code Stroke    HPI Christie Morrison is a 63 y.o. female.     HPI Presents as a code stroke. Patient has multiple medical problems, but states that she was generally well aside from not eating for the past 2 days. While walking she felt particularly weak in her legs, did not have a fall, but with a family member present was concerned for this weakness she was sent here for evaluation. Currently she states that she feels better, denies new focal weakness, denies pain, denies discomfort It is unclear if she has had any recent medication changes, she states that she takes which she is supposed to regularly. Patient denies history of a stroke. Past Medical History:  Diagnosis Date  . Acute hepatitis   . Cirrhosis ([place])   . Crack cocaine use   . Duodenitis   . ETOH abuse   . High cholesterol   . Mallory-Weiss tear   . Seizures [facility])     Patient Active Problem List   Diagnosis Date Noted  . Cirrhosis, alcoholic ([place])   . Acute hepatitis   . Dehydration, severe 06/08/2016  . Elevated lactic acid level 06/08/2016  . Alcohol abuse 06/08/2016  . Melena 06/08/2016  . Cocaine abuse ([place]) 06/08/2016  . Thrombocytopenia ([place]) 06/08/2016  . Acute hypernatremia 06/08/2016  . Acute alcohol intoxication ([place]) 06/08/2016    Past Surgical History:  Procedure Laterality Date  . CESAREAN SECTION       OB History   No obstetric history on file.      Home Medications    Prior to Admission medications   Medication Sig Start Date End Date Taking? Authorizing Provider  Magnesium 300 MG CAPS Take 300 mg by mouth 2 (two) times a day.   Yes [provider]  POTASSIUM PO Take 1  tablet by mouth daily.   Yes [provider]  amoxicillin-clavulanate (AUGMENTIN) 875-125 MG tablet Take 1 tablet by mouth 2 (two) times daily. One po bid x 7 days Patient not taking: Reported on 09/22/2018 03/10/18   Jacqlyn Larsen, PA-C  magnesium oxide (MAG-OX) 400 MG tablet Take 1 tablet (400 mg total) by mouth 2 (two) times daily. Patient not taking: Reported on 09/22/2018 11/25/17   Gildardo Pounds, NP  Magnesium Oxide 400 (240 Mg) MG TABS Take 1 tablet (400 mg total) by mouth daily. Patient not taking: Reported on 06/26/2018 06/18/18   Palumbo, April, MD  Magnesium Oxide 400 MG CAPS Take 1 capsule (400 mg total) by mouth daily. Patient not taking: Reported on 09/22/2018 06/13/18   Fredia Sorrow, MD  pantoprazole (PROTONIX) 40 MG tablet Take 1 tablet (40 mg total) by mouth daily. Patient not taking: Reported on 09/22/2018 11/25/17 06/18/26  Gildardo Pounds, NP  potassium chloride SA (K-DUR,KLOR-CON) 20 MEQ tablet Take 1 tablet (20 mEq total) by mouth daily. Patient not taking: Reported on 06/26/2018 11/25/17   Gildardo Pounds, NP    Family History Family History  Problem Relation Age of Onset  . Diabetes Mellitus II Mother   . Kidney failure Mother   . Alcohol abuse Mother   . Diabetes Mellitus II Brother   . Kidney failure Brother   . Alcohol  abuse Maternal Grandmother     Social History Social History   Tobacco Use  . Smoking status: Never Smoker  . Smokeless tobacco: Never Used  Substance Use Topics  . Alcohol use: Yes    Comment: occasionally   . Drug use: Not Currently    Frequency: 1.0 times per week    Types: Cocaine     Allergies   Patient has no known allergies.   Review of Systems Review of Systems  Constitutional:       Per HPI, otherwise negative  HENT:       Per HPI, otherwise negative  Respiratory:       Per HPI, otherwise negative  Cardiovascular:       Per HPI, otherwise negative  Gastrointestinal: Negative for vomiting.  Endocrine:        Negative aside from HPI  Genitourinary:       Neg aside from HPI   Musculoskeletal:       Per HPI, otherwise negative  Skin: Negative.   Neurological: Positive for weakness. Negative for syncope.     Physical Exam Updated Vital Signs BP (!) 149/100   Pulse (!) 102   Temp 97.7 F (36.5 C) (Oral)   Resp (!) 22   Ht 5\' 10"  (1.778 m)   Wt 62.4 kg   SpO2 100%   BMI 19.74 kg/m   Physical Exam Vitals signs and nursing note reviewed.  Constitutional:      General: She is not in acute distress.    Appearance: She is well-developed.     Comments: Chronically ill-appearing female awake and alert  HENT:     Head: Normocephalic and atraumatic.  Eyes:     Conjunctiva/sclera: Conjunctivae normal.  Cardiovascular:     Rate and Rhythm: Normal rate and regular rhythm.  Pulmonary:     Effort: Pulmonary effort is normal. No respiratory distress.     Breath sounds: Normal breath sounds. No stridor.  Abdominal:     General: There is no distension.  Skin:    General: Skin is warm and dry.  Neurological:     Mental Status: She is alert and oriented to person, place, and time.     Cranial Nerves: No cranial nerve deficit.     Comments: Difficult to ascertain what is old versus new in terms of weakness, the patient has diffuse atrophy.  LE seems disproportionately weak compared to UE, though symetric      ED Treatments / Results  Labs (all labs ordered are listed, but only abnormal results are displayed) Labs Reviewed  CBC - Abnormal; Notable for the following components:      Result Value   WBC 18.8 (*)    RBC 3.23 (*)    Hemoglobin 11.2 (*)    HCT 33.0 (*)    MCV 102.2 (*)    MCH 34.7 (*)    All other components within normal limits  DIFFERENTIAL - Abnormal; Notable for the following components:   Neutro Abs 16.2 (*)    Abs Immature Granulocytes 0.14 (*)    All other components within normal limits  COMPREHENSIVE METABOLIC PANEL - Abnormal; Notable for the following  components:   Chloride 91 (*)    CO2 16 (*)    Glucose, Bld 157 (*)    Creatinine, Ser 1.59 (*)    Calcium 8.0 (*)    AST 102 (*)    Alkaline Phosphatase 159 (*)    Total Bilirubin 2.5 (*)    GFR  calc non Af Amer 34 (*)    GFR calc Af Amer 40 (*)    Anion gap 31 (*)    All other components within normal limits  RAPID URINE DRUG SCREEN, HOSP PERFORMED - Abnormal; Notable for the following components:   Cocaine POSITIVE (*)    All other components within normal limits  URINALYSIS, ROUTINE W REFLEX MICROSCOPIC - Abnormal; Notable for the following components:   Color, Urine AMBER (*)    APPearance CLOUDY (*)    Glucose, UA 50 (*)    Hgb urine dipstick SMALL (*)    Bilirubin Urine SMALL (*)    Ketones, ur 20 (*)    Protein, ur >=300 (*)    Leukocytes,Ua SMALL (*)    WBC, UA >50 (*)    Bacteria, UA MANY (*)    Non Squamous Epithelial 0-5 (*)    All other components within normal limits  CBG MONITORING, ED - Abnormal; Notable for the following components:   Glucose-Capillary 152 (*)    All other components within normal limits  I-STAT CREATININE, ED - Abnormal; Notable for the following components:   Creatinine, Ser 1.10 (*)    All other components within normal limits  PROTIME-INR  APTT  TROPONIN I  ETHANOL  CBG MONITORING, ED  I-STAT CREATININE, ED    EKG EKG Interpretation  Date/Time:  Monday Sep 22 2018 17:43:45 EDT Ventricular Rate:  110 PR Interval:    QRS Duration: 87 QT Interval:  426 QTC Calculation: 577 R Axis:   25 Text Interpretation:  Sinus tachycardia Probable left atrial enlargement Anteroseptal infarct, old Minimal ST depression, inferior leads Prolonged QT interval Abnormal ekg Confirmed by Carmin Muskrat [phone]) on 09/22/2018 6:13:57 PM   Radiology Mr Brain Wo Contrast  Result Date: 09/22/2018 CLINICAL DATA:  Generalized weakness, right greater than left. History of alcohol and drug abuse, cirrhosis, and seizures. EXAM: MRI HEAD WITHOUT CONTRAST  TECHNIQUE: Multiplanar, multiecho pulse sequences of the brain and surrounding structures were obtained without intravenous contrast. COMPARISON:  Head CT 09/22/2018 FINDINGS: Brain: No acute infarct, mass, midline shift, or extra-axial fluid collection is identified. A single chronic microhemorrhage is noted in the right cerebellar hemisphere. Cerebral white matter T2 hyperintensities are most notable in the periventricular regions and are nonspecific but compatible with mild chronic small vessel ischemic disease. There is mild cerebral and cerebellar atrophy. Vascular: Major intracranial vascular flow voids are preserved. Skull and upper cervical spine: Unremarkable bone marrow signal. Sinuses/Orbits: Unremarkable orbits. Mild-to-moderate mucosal thickening in the paranasal sinuses with small volume sphenoid sinus fluid as described on earlier CT. Trace right mastoid effusion. Other: None. IMPRESSION: 1. No acute intracranial abnormality. 2. Mild chronic small vessel ischemic disease. Electronically Signed   By: Logan Bores M.D.   On: 09/22/2018 20:05   Ct Head Code Stroke Wo Contrast`  Result Date: 09/22/2018 CLINICAL DATA:  Code stroke.  Right-sided deficits. EXAM: CT HEAD WITHOUT CONTRAST TECHNIQUE: Contiguous axial images were obtained from the base of the skull through the vertex without intravenous contrast. COMPARISON:  10/23/2017 FINDINGS: Brain: There is no evidence of acute infarct, intracranial hemorrhage, mass, midline shift, or extra-axial fluid collection. Patchy cerebral white matter hypodensities are similar to the prior study and nonspecific but compatible with mild-to-moderate chronic small vessel ischemic disease. There is mild cerebral atrophy. Vascular: Calcified atherosclerosis at the skull base. No hyperdense vessel. Skull: No acute fracture or suspicious osseous lesion. Sinuses/Orbits: Old medial left orbital blowout fracture. Mild bilateral ethmoid, maxillary, and sphenoid  sinus  mucosal thickening. Small volume bubbly fluid in the sphenoid sinuses. Clear mastoid air cells. Other: None. ASPECTS [facility] Stroke Program Early CT Score) - Ganglionic level infarction (caudate, lentiform nuclei, internal capsule, insula, M1-M3 cortex): 7 - Supraganglionic infarction (M4-M6 cortex): 3 Total score (0-10 with 10 being normal): 10 IMPRESSION: 1. No evidence of acute intracranial abnormality. 2. ASPECTS is 10. 3. Mild-to-moderate chronic small vessel ischemic disease. These results were communicated to Dr. Cheral Marker at 5:30 pm on 09/22/2018 by text page via the [facility] messaging system. Electronically Signed   By: Logan Bores M.D.   On: 09/22/2018 17:30    Procedures Procedures (including critical care time)  Medications Ordered in ED Medications  sodium chloride flush (NS) 0.9 % injection 3 mL (has no administration in time range)  fosfomycin (MONUROL) packet 3 g (3 g Oral Given 09/22/18 2254)     Initial Impression / Assessment and Plan / ED Course  I have reviewed the triage vital signs and the nursing notes.  Pertinent labs & imaging results that were available during my care of the patient were reviewed by me and considered in my medical decision making (see chart for details).    Update: Patient in no distress, no ongoing complaints   Date: I discussed the patient's case with our neurology colleagues given that the patient was seen as a code stroke. With concern for her weakness, the patient will have MRI, this is normal, low suspicion for stroke, occult.  Update: Patient in no distress, remains hemodynamically unremarkable, initial labs notable for elevated creatinine, mild leukocytosis Patient is afebrile, no hypotension, no evidence for sepsis, bacteremia. 10:54 PM Mainly labs available, most notable for cocaine positive drug screen, some urinalysis evidence for infection, which may contribute to the patient's leukocytosis With elevated creatinine, suspicion for  dehydration status that she has a received fluids, will receive fosfomycin, and is then appropriate for discharge given no evidence for stroke, no evidence for decompensated state.   Final Clinical Impressions(s) / ED Diagnoses  Weakness Dehydration Urinary tract infection   Carmin Muskrat, MD 09/22/18 2256

## 2018-09-22 NOTE — ED Triage Notes (Signed)
Pt arrive via EMS with Code Stroke. Pt was walking to the liquor store when her son saw her get weak and helped her to sit down on the stairs. Pt complained of tingling in the right leg and arm. Bilateral weakness in both legs. Hx. Alcohol abuse and seizures. Pt states this isn't how she feels with a seizure. EMS reports improve in the pts status since they first picked her up.

## 2018-10-15 ENCOUNTER — Other Ambulatory Visit: Payer: Self-pay

## 2018-10-15 ENCOUNTER — Emergency Department (HOSPITAL_COMMUNITY): Payer: Self-pay

## 2018-10-15 ENCOUNTER — Inpatient Hospital Stay (HOSPITAL_COMMUNITY)
Admission: EM | Admit: 2018-10-15 | Discharge: 2018-10-17 | DRG: 101 | Disposition: A | Discharge status: Home / Self Care | Payer: Self-pay | Attending: Internal Medicine | Admitting: Internal Medicine

## 2018-10-15 ENCOUNTER — Encounter (HOSPITAL_COMMUNITY): Payer: Self-pay | Admitting: Emergency Medicine

## 2018-10-15 DIAGNOSIS — N182 Chronic kidney disease, stage 2 (mild): Secondary | ICD-10-CM | POA: Diagnosis present

## 2018-10-15 DIAGNOSIS — R402242 Coma scale, best verbal response, confused conversation, at arrival to emergency department: Secondary | ICD-10-CM | POA: Diagnosis present

## 2018-10-15 DIAGNOSIS — R402142 Coma scale, eyes open, spontaneous, at arrival to emergency department: Secondary | ICD-10-CM | POA: Diagnosis present

## 2018-10-15 DIAGNOSIS — N179 Acute kidney failure, unspecified: Secondary | ICD-10-CM | POA: Diagnosis present

## 2018-10-15 DIAGNOSIS — R402362 Coma scale, best motor response, obeys commands, at arrival to emergency department: Secondary | ICD-10-CM | POA: Diagnosis present

## 2018-10-15 DIAGNOSIS — K7030 Alcoholic cirrhosis of liver without ascites: Secondary | ICD-10-CM | POA: Diagnosis present

## 2018-10-15 DIAGNOSIS — F1010 Alcohol abuse, uncomplicated: Secondary | ICD-10-CM | POA: Diagnosis present

## 2018-10-15 DIAGNOSIS — R569 Unspecified convulsions: Principal | ICD-10-CM

## 2018-10-15 DIAGNOSIS — Z811 Family history of alcohol abuse and dependence: Secondary | ICD-10-CM

## 2018-10-15 DIAGNOSIS — Z1159 Encounter for screening for other viral diseases: Secondary | ICD-10-CM

## 2018-10-15 DIAGNOSIS — E876 Hypokalemia: Secondary | ICD-10-CM

## 2018-10-15 DIAGNOSIS — Z79899 Other long term (current) drug therapy: Secondary | ICD-10-CM

## 2018-10-15 DIAGNOSIS — F1410 Cocaine abuse, uncomplicated: Secondary | ICD-10-CM | POA: Diagnosis present

## 2018-10-15 DIAGNOSIS — E7800 Pure hypercholesterolemia, unspecified: Secondary | ICD-10-CM | POA: Diagnosis present

## 2018-10-15 DIAGNOSIS — R258 Other abnormal involuntary movements: Secondary | ICD-10-CM | POA: Diagnosis present

## 2018-10-15 DIAGNOSIS — N189 Chronic kidney disease, unspecified: Secondary | ICD-10-CM

## 2018-10-15 LAB — COMPREHENSIVE METABOLIC PANEL
ALT: 23 U/L (ref 0–44)
AST: 93 U/L — ABNORMAL HIGH (ref 15–41)
Albumin: 3.7 g/dL (ref 3.5–5.0)
Alkaline Phosphatase: 178 U/L — ABNORMAL HIGH (ref 38–126)
Anion gap: 32 — ABNORMAL HIGH (ref 5–15)
BUN: 10 mg/dL (ref 8–23)
CO2: 12 mmol/L — ABNORMAL LOW (ref 22–32)
Calcium: 6.7 mg/dL — ABNORMAL LOW (ref 8.9–10.3)
Chloride: 91 mmol/L — ABNORMAL LOW (ref 98–111)
Creatinine, Ser: 1.78 mg/dL — ABNORMAL HIGH (ref 0.44–1.00)
GFR calc Af Amer: 35 mL/min — ABNORMAL LOW (ref >60)
GFR calc non Af Amer: 30 mL/min — ABNORMAL LOW (ref >60)
Glucose, Bld: 102 mg/dL — ABNORMAL HIGH (ref 70–99)
Potassium: 3.1 mmol/L — ABNORMAL LOW (ref 3.5–5.1)
Sodium: 135 mmol/L (ref 135–145)
Total Bilirubin: 1.6 mg/dL — ABNORMAL HIGH (ref 0.3–1.2)
Total Protein: 7.7 g/dL (ref 6.5–8.1)

## 2018-10-15 LAB — CBC WITH DIFFERENTIAL/PLATELET
Abs Immature Granulocytes: 0.27 10*3/uL — ABNORMAL HIGH (ref 0.00–0.07)
Basophils Absolute: 0.1 10*3/uL (ref 0.0–0.1)
Basophils Relative: 0 %
Eosinophils Absolute: 0.3 10*3/uL (ref 0.0–0.5)
Eosinophils Relative: 1 %
HCT: 32.7 % — ABNORMAL LOW (ref 36.0–46.0)
Hemoglobin: 10.6 g/dL — ABNORMAL LOW (ref 12.0–15.0)
Immature Granulocytes: 1 %
Lymphocytes Relative: 5 %
Lymphs Abs: 1.2 10*3/uL (ref 0.7–4.0)
MCH: 34.8 pg — ABNORMAL HIGH (ref 26.0–34.0)
MCHC: 32.4 g/dL (ref 30.0–36.0)
MCV: 107.2 fL — ABNORMAL HIGH (ref 80.0–100.0)
Monocytes Absolute: 1.2 10*3/uL — ABNORMAL HIGH (ref 0.1–1.0)
Monocytes Relative: 5 %
Neutro Abs: 21.6 10*3/uL — ABNORMAL HIGH (ref 1.7–7.7)
Neutrophils Relative %: 88 %
Platelets: 147 10*3/uL — ABNORMAL LOW (ref 150–400)
RBC: 3.05 MIL/uL — ABNORMAL LOW (ref 3.87–5.11)
RDW: 15.6 % — ABNORMAL HIGH (ref 11.5–15.5)
WBC: 24.7 10*3/uL — ABNORMAL HIGH (ref 4.0–10.5)
nRBC: 0.2 % (ref 0.0–0.2)

## 2018-10-15 LAB — CBG MONITORING, ED: Glucose-Capillary: 98 mg/dL (ref 70–99)

## 2018-10-15 LAB — RAPID URINE DRUG SCREEN, HOSP PERFORMED
Amphetamines: NOT DETECTED
Barbiturates: NOT DETECTED
Benzodiazepines: NOT DETECTED
Cocaine: POSITIVE — AB
Opiates: NOT DETECTED
Tetrahydrocannabinol: NOT DETECTED

## 2018-10-15 LAB — URINALYSIS, ROUTINE W REFLEX MICROSCOPIC
Glucose, UA: 50 mg/dL — AB
Ketones, ur: 20 mg/dL — AB
Leukocytes,Ua: NEGATIVE
Nitrite: NEGATIVE
Protein, ur: 100 mg/dL — AB
Specific Gravity, Urine: 1.021 (ref 1.005–1.030)
pH: 5 (ref 5.0–8.0)

## 2018-10-15 LAB — SARS CORONAVIRUS 2 BY RT PCR (HOSPITAL ORDER, PERFORMED IN ~~LOC~~ HOSPITAL LAB): SARS Coronavirus 2: NEGATIVE

## 2018-10-15 LAB — ETHANOL: Alcohol, Ethyl (B): <10 mg/dL (ref <10)

## 2018-10-15 MED ORDER — POTASSIUM CHLORIDE CRYS ER 20 MEQ PO TBCR
40.0000 meq | EXTENDED_RELEASE_TABLET | Freq: Once | ORAL | Status: AC
  Administered 2018-10-15: 40 meq via ORAL
  Filled 2018-10-15: qty 2

## 2018-10-15 MED ORDER — HEPARIN SODIUM (PORCINE) 5000 UNIT/ML IJ SOLN
5000.0000 [IU] | Freq: Three times a day (TID) | INTRAMUSCULAR | Status: DC
  Administered 2018-10-15 – 2018-10-17 (×5): 5000 [IU] via SUBCUTANEOUS
  Filled 2018-10-15 (×5): qty 1

## 2018-10-15 MED ORDER — ACETAMINOPHEN 325 MG PO TABS: 650.0000 mg | ORAL_TABLET | Freq: Four times a day (QID) | ORAL | Status: DC | PRN

## 2018-10-15 MED ORDER — ADULT MULTIVITAMIN W/MINERALS CH
1.0000 | ORAL_TABLET | Freq: Every day | ORAL | Status: DC
  Administered 2018-10-16 – 2018-10-17 (×2): 1 via ORAL
  Filled 2018-10-15 (×2): qty 1

## 2018-10-15 MED ORDER — LORAZEPAM 2 MG/ML IJ SOLN: 1.0000 mg | Freq: Four times a day (QID) | INTRAMUSCULAR | Status: DC | PRN

## 2018-10-15 MED ORDER — VITAMIN B-1 100 MG PO TABS
100.0000 mg | ORAL_TABLET | Freq: Every day | ORAL | Status: DC
  Administered 2018-10-16 – 2018-10-17 (×2): 100 mg via ORAL
  Filled 2018-10-15 (×2): qty 1

## 2018-10-15 MED ORDER — ONDANSETRON HCL 4 MG/2ML IJ SOLN: 4.0000 mg | Freq: Four times a day (QID) | INTRAMUSCULAR | Status: DC | PRN

## 2018-10-15 MED ORDER — SODIUM CHLORIDE 0.9% FLUSH: 3.0000 mL | Freq: Two times a day (BID) | INTRAVENOUS | Status: DC

## 2018-10-15 MED ORDER — LORAZEPAM 2 MG/ML IJ SOLN
1.0000 mg | Freq: Once | INTRAMUSCULAR | Status: AC
  Administered 2018-10-15: 1 mg via INTRAVENOUS
  Filled 2018-10-15: qty 1

## 2018-10-15 MED ORDER — FOLIC ACID 1 MG PO TABS
1.0000 mg | ORAL_TABLET | Freq: Every day | ORAL | Status: DC
  Administered 2018-10-16 – 2018-10-17 (×2): 1 mg via ORAL
  Filled 2018-10-15 (×2): qty 1

## 2018-10-15 MED ORDER — ONDANSETRON HCL 4 MG PO TABS: 4.0000 mg | ORAL_TABLET | Freq: Four times a day (QID) | ORAL | Status: DC | PRN

## 2018-10-15 MED ORDER — SODIUM CHLORIDE 0.9 % IV BOLUS (SEPSIS)
1000.0000 mL | Freq: Once | INTRAVENOUS | Status: AC
  Administered 2018-10-15: 1000 mL via INTRAVENOUS

## 2018-10-15 MED ORDER — THIAMINE HCL 100 MG/ML IJ SOLN: 100.0000 mg | Freq: Every day | INTRAMUSCULAR | Status: DC

## 2018-10-15 MED ORDER — SODIUM CHLORIDE 0.9 % IV SOLN
1000.0000 mL | INTRAVENOUS | Status: AC
  Administered 2018-10-15 (×2): 1000 mL via INTRAVENOUS

## 2018-10-15 MED ORDER — LORAZEPAM 1 MG PO TABS
1.0000 mg | ORAL_TABLET | Freq: Four times a day (QID) | ORAL | Status: DC | PRN
  Administered 2018-10-16: 1 mg via ORAL
  Filled 2018-10-15: qty 1

## 2018-10-15 MED ORDER — ACETAMINOPHEN 650 MG RE SUPP: 650.0000 mg | Freq: Four times a day (QID) | RECTAL | Status: DC | PRN

## 2018-10-15 NOTE — H&P (Signed)
History and Physical    Christie Morrison RDE:[id number] DOB: 1956/03/09 DOA: 10/15/2018  PCP: Gildardo Pounds, NP  Patient coming from: Brought by EMS from bus stop  I have personally briefly reviewed patient's old medical records in [place]  Chief Complaint: Seizure-like activity  HPI: Christie Morrison is a 63 y.o. female with medical history significant for cirrhosis, alcohol use, and seizure-like episodes who was brought to the ED by EMS for reported seizure-like episode while waiting at a bus stop.  Patient states that she normally takes potassium and magnesium supplements which she ran out of about 4 days ago.  She says she has not been eating well since that time either.  She has had intermittent lightheadedness.  She was at the bus stop earlier today when she noticed having cramping in both of her legs and became diaphoretic.  She does not recall what happened next, just arriving in the hospital.  Per ED documentation she had a witnessed shaking spell and was unresponsive for approximately 1-2 minutes.  Per report she did not fall or have any injury.  EMS were called and she was reportedly near her baseline with some confusion.  She does not recall any chest pain, dyspnea, fevers, abdominal pain, nausea, or vomiting just prior to this episode.  She reports regular daily bowel movements without diarrhea.  She denies any dysuria.  She is not taking any other medications regularly other than potassium and magnesium supplements.  She reports chronic alcohol use, drinking about one 12 ounce beer per day with last drink same day of admission.  She denies any history of alcohol withdrawal.  She denies any tobacco use.  She reports a history of marijuana use and denies any cocaine or IV drug use.  ED Course:  Initial vitals showed BP 143/97, pulse 122, RR 22, temp 99.2 Fahrenheit, SPO2 100% on room air.  Labs are notable for WBC 24.7 (18.8 on 09/22/2018), hemoglobin 10.6, platelets  147,000, potassium 3.1, serum glucose 102, BUN 10, serum creatinine 1.78 (baseline around 1.2), AST 93, ALT 23, alk phos 178, total bilirubin 1.6, serum ethanol level was undetectable.  Urinalysis was not suggestive of UTI.  UDS was positive for cocaine.  SARS-CoV-2 test was negative.  Patient was given 1 L normal saline, IV Ativan 1 mg once, and oral KDur 40 mEq once.  Portable chest x-ray was negative for focal consolidation, edema, or effusion.  CT head without contrast was negative for acute abnormality.  The hospitalist service was consulted to admit for further evaluation and management.   Review of Systems: All systems reviewed and are negative except as documented in history of present illness above.   Past Medical History:  Diagnosis Date  . Acute hepatitis   . Cirrhosis ([place])   . Crack cocaine use   . Duodenitis   . ETOH abuse   . High cholesterol   . Mallory-Weiss tear   . Seizures ([place])     Past Surgical History:  Procedure Laterality Date  . CESAREAN SECTION      Social History:  reports that she has never smoked. She has never used smokeless tobacco. She reports current alcohol use. She reports previous drug use. Frequency: 1.00 time per week. Drug: Cocaine.  No Known Allergies  Family History  Problem Relation Age of Onset  . Diabetes Mellitus II Mother   . Kidney failure Mother   . Alcohol abuse Mother   . Diabetes Mellitus II Brother   .  Kidney failure Brother   . Alcohol abuse Maternal Grandmother      Prior to Admission medications   Medication Sig Start Date End Date Taking? Authorizing Provider  amoxicillin-clavulanate (AUGMENTIN) 875-125 MG tablet Take 1 tablet by mouth 2 (two) times daily. One po bid x 7 days Patient not taking: Reported on 09/22/2018 03/10/18   Jacqlyn Larsen, PA-C  Magnesium 300 MG CAPS Take 300 mg by mouth 2 (two) times a day.    [provider]  magnesium oxide (MAG-OX) 400 MG tablet Take 1 tablet (400 mg total) by  mouth 2 (two) times daily. Patient not taking: Reported on 09/22/2018 11/25/17   Gildardo Pounds, NP  Magnesium Oxide 400 (240 Mg) MG TABS Take 1 tablet (400 mg total) by mouth daily. Patient not taking: Reported on 06/26/2018 06/18/18   Palumbo, April, MD  Magnesium Oxide 400 MG CAPS Take 1 capsule (400 mg total) by mouth daily. Patient not taking: Reported on 09/22/2018 06/13/18   Fredia Sorrow, MD  pantoprazole (PROTONIX) 40 MG tablet Take 1 tablet (40 mg total) by mouth daily. Patient not taking: Reported on 09/22/2018 11/25/17 06/18/26  Gildardo Pounds, NP  potassium chloride SA (K-DUR,KLOR-CON) 20 MEQ tablet Take 1 tablet (20 mEq total) by mouth daily. Patient not taking: Reported on 06/26/2018 11/25/17   Gildardo Pounds, NP  POTASSIUM PO Take 1 tablet by mouth daily.    [provider]  thiamine (VITAMIN B-1) 100 MG tablet Please use the thiamine 3 times daily for the next 3 days, then take it only 1 tablet daily. 09/22/18   Carmin Muskrat, MD    Physical Exam: Vitals:   10/15/18 2015 10/15/18 2030 10/15/18 2214 10/15/18 2215  BP: 114/90 (!) 144/64 (!) 150/96   Pulse:  100  97  Resp: 20 (!) 28    Temp:      TempSrc:      SpO2: 99% 99%  100%  Weight:      Height:        Constitutional: Thin woman resting supine in bed, NAD, calm, comfortable Eyes: PERRL, EOMI, lids and conjunctivae normal ENMT: Mucous membranes are dry. Posterior pharynx clear of any exudate or lesions..  Neck: normal, supple, no masses. Respiratory: clear to auscultation bilaterally, no wheezing, no crackles. Normal respiratory effort. No accessory muscle use.  Cardiovascular: Regular rate and rhythm, no murmurs / rubs / gallops. No extremity edema. 2+ pedal pulses. Abdomen: no tenderness, no masses palpated. No hepatosplenomegaly. Bowel sounds positive.  Musculoskeletal: no clubbing / cyanosis. No joint deformity upper and lower extremities. Good ROM, no contractures. Normal muscle tone.  Skin: no rashes,  lesions, ulcers. No induration Neurologic: CN 2-12 grossly intact. Sensation intact, Strength 5/5 in all 4, no dysmetria or asterixis.  Psychiatric: Alert and oriented x 3. Normal mood.     Labs on Admission: I have personally reviewed following labs and imaging studies  CBC: Recent Labs  Lab 10/15/18 1802  WBC 24.7*  NEUTROABS 21.6*  HGB 10.6*  HCT 32.7*  MCV 107.2*  PLT 161*   Basic Metabolic Panel: Recent Labs  Lab 10/15/18 1802  NA 135  K 3.1*  CL 91*  CO2 12*  GLUCOSE 102*  BUN 10  CREATININE 1.78*  CALCIUM 6.7*   GFR: Estimated Creatinine Clearance: 32.3 mL/min (A) (by C-G formula based on SCr of 1.78 mg/dL (H)). Liver Function Tests: Recent Labs  Lab 10/15/18 1802  AST 93*  ALT 23  ALKPHOS 178*  BILITOT  1.6*  PROT 7.7  ALBUMIN 3.7   No results for input(s): LIPASE, AMYLASE in the last 168 hours. No results for input(s): AMMONIA in the last 168 hours. Coagulation Profile: No results for input(s): INR, PROTIME in the last 168 hours. Cardiac Enzymes: No results for input(s): CKTOTAL, CKMB, CKMBINDEX, TROPONINI in the last 168 hours. BNP (last 3 results) No results for input(s): PROBNP in the last 8760 hours. HbA1C: No results for input(s): HGBA1C in the last 72 hours. CBG: Recent Labs  Lab 10/15/18 1801  GLUCAP 98   Lipid Profile: No results for input(s): CHOL, HDL, LDLCALC, TRIG, CHOLHDL, LDLDIRECT in the last 72 hours. Thyroid Function Tests: No results for input(s): TSH, T4TOTAL, FREET4, T3FREE, THYROIDAB in the last 72 hours. Anemia Panel: No results for input(s): VITAMINB12, FOLATE, FERRITIN, TIBC, IRON, RETICCTPCT in the last 72 hours. Urine analysis:    Component Value Date/Time   COLORURINE AMBER (A) 10/15/2018 2050   APPEARANCEUR CLOUDY (A) 10/15/2018 2050   LABSPEC 1.021 10/15/2018 2050   PHURINE 5.0 10/15/2018 2050   GLUCOSEU 50 (A) 10/15/2018 2050   HGBUR SMALL (A) 10/15/2018 2050   BILIRUBINUR SMALL (A) 10/15/2018 2050    KETONESUR 20 (A) 10/15/2018 2050   PROTEINUR 100 (A) 10/15/2018 2050   NITRITE NEGATIVE 10/15/2018 2050   LEUKOCYTESUR NEGATIVE 10/15/2018 2050    Radiological Exams on Admission: Ct Head Wo Contrast  Result Date: 10/15/2018 CLINICAL DATA:  Near syncope today. EXAM: CT HEAD WITHOUT CONTRAST TECHNIQUE: Contiguous axial images were obtained from the base of the skull through the vertex without intravenous contrast. COMPARISON:  Brain MRI and head CT scans 09/22/2018. FINDINGS: Brain: No evidence of acute infarction, hemorrhage, hydrocephalus, extra-axial collection or mass lesion/mass effect. Mild chronic microvascular ischemic change and cortical atrophy noted. Vascular: Atherosclerosis is identified. Skull: Intact.  No focal lesion. Sinuses/Orbits: Minimal mucosal thickening left maxillary sinus noted. Other: None. IMPRESSION: No acute abnormality. Atrophy and chronic microvascular ischemic change. Atherosclerosis. Electronically Signed   By: Inge Rise M.D.   On: 10/15/2018 19:36   Dg Chest Portable 1 View  Result Date: 10/15/2018 CLINICAL DATA:  Elevated white count EXAM: PORTABLE CHEST 1 VIEW COMPARISON:  06/09/2016 FINDINGS: The heart size and mediastinal contours are within normal limits. Both lungs are clear. The visualized skeletal structures are unremarkable. Mild aortic atherosclerosis. IMPRESSION: No active disease. Electronically Signed   By: Donavan Foil M.D.   On: 10/15/2018 20:30    EKG: Ordered and pending.  Assessment/Plan Principal Problem:   Seizure-like activity (HCC) Active Problems:   Alcohol abuse   Cirrhosis, alcoholic ([place])   Acute-on-chronic kidney injury ([place])   Hypokalemia  Christie Morrison is a 63 y.o. female with medical history significant for cirrhosis, alcohol use, and seizure-like episodes who is admitted after syncope versus seizure-like episode.   Syncope versus seizure-like activity versus alcohol withdrawal: Has history of similar episodes in  the past.  Previously seen by neurology with normal EEG in June 2019. -Monitor on telemetry -Obtain orthostatic vital signs -Seizure precautions, obtain EEG in a.m. -Place on CIWA protocol  Acute on chronic stage II kidney injury: Likely prerenal from poor oral intake.  Continue IV fluid hydration and recheck labs in a.m.  Hypokalemia: K 3.1 on admission.  Oral repletion given in ED.  Will check magnesium and replete as needed.  Leukocytosis: WBC 24.7 compared to 18.8 on 09/22/2018.  No obvious infectious source.  Possibly reactive in setting of possible seizure activity. -Continue to monitor  Cirrhosis of  the liver: LFTs stable to improving compared to recent labs.  She has no abdominal pain or overt signs/symptoms of decompensated cirrhosis.  Continue to monitor.  Alcohol use: Patient reports drinking 1 beer daily.  Son reports significant liquor use with last drink 3 days prior to admission.  She is at high risk for alcohol withdrawal. -CIWA protocol  DVT prophylaxis: Subcutaneous heparin Code Status: Full code, confirmed with patient Family Communication: Discussed with her son Christy Sartorius by phone Disposition Plan: Likely discharge to home in 1-2 days Consults called: None Admission status: Observation   Zada Finders MD [facility]  If 7PM-7AM, please contact night-coverage [email or web address]  10/15/2018, 10:18 PM

## 2018-10-15 NOTE — ED Provider Notes (Signed)
[facility] EMERGENCY DEPARTMENT Provider Note   CSN: [id number] Arrival date & time: 10/15/18  1738    History   Chief Complaint Chief Complaint  Patient presents with   Seizures    HPI Christie Morrison is a 63 y.o. female.     HPI Patient presents to the emergency room for probable seizure.  Patient has a history of substance abuse problems as well as seizure disorder.  She was at the bus stop today with a few individuals.  They reported to EMS that she had a shaking spell and was unresponsive.  This lasted maybe a minute or 2.  Patient did not fall and there were no injuries.  Patient was more back to baseline although still confused when EMS arrived.  She denies any complaints now.  Patient denies using any alcohol or drugs recently.  Denies any fevers chills or headache.  No other complaints. Past Medical History:  Diagnosis Date   Acute hepatitis    Cirrhosis ([place])    Crack cocaine use    Duodenitis    ETOH abuse    High cholesterol    Mallory-Weiss tear    Seizures ([place])     Patient Active Problem List   Diagnosis Date Noted   Cirrhosis, alcoholic ([place])    Acute hepatitis    Dehydration, severe 06/08/2016   Elevated lactic acid level 06/08/2016   Alcohol abuse 06/08/2016   Melena 06/08/2016   Cocaine abuse ([place]) 06/08/2016   Thrombocytopenia ([place]) 06/08/2016   Acute hypernatremia 06/08/2016   Acute alcohol intoxication ([place]) 06/08/2016    Past Surgical History:  Procedure Laterality Date   CESAREAN SECTION       OB History   No obstetric history on file.      Home Medications    Prior to Admission medications   Medication Sig Start Date End Date Taking? Authorizing Provider  amoxicillin-clavulanate (AUGMENTIN) 875-125 MG tablet Take 1 tablet by mouth 2 (two) times daily. One po bid x 7 days Patient not taking: Reported on 09/22/2018 03/10/18   Jacqlyn Larsen, PA-C  Magnesium 300 MG CAPS Take 300 mg by mouth 2  (two) times a day.    [provider]  magnesium oxide (MAG-OX) 400 MG tablet Take 1 tablet (400 mg total) by mouth 2 (two) times daily. Patient not taking: Reported on 09/22/2018 11/25/17   Gildardo Pounds, NP  Magnesium Oxide 400 (240 Mg) MG TABS Take 1 tablet (400 mg total) by mouth daily. Patient not taking: Reported on 06/26/2018 06/18/18   Palumbo, April, MD  Magnesium Oxide 400 MG CAPS Take 1 capsule (400 mg total) by mouth daily. Patient not taking: Reported on 09/22/2018 06/13/18   Fredia Sorrow, MD  pantoprazole (PROTONIX) 40 MG tablet Take 1 tablet (40 mg total) by mouth daily. Patient not taking: Reported on 09/22/2018 11/25/17 06/18/26  Gildardo Pounds, NP  potassium chloride SA (K-DUR,KLOR-CON) 20 MEQ tablet Take 1 tablet (20 mEq total) by mouth daily. Patient not taking: Reported on 06/26/2018 11/25/17   Gildardo Pounds, NP  POTASSIUM PO Take 1 tablet by mouth daily.    [provider]  thiamine (VITAMIN B-1) 100 MG tablet Please use the thiamine 3 times daily for the next 3 days, then take it only 1 tablet daily. 09/22/18   Carmin Muskrat, MD    Family History Family History  Problem Relation Age of Onset   Diabetes Mellitus II Mother    Kidney failure Mother  Alcohol abuse Mother    Diabetes Mellitus II Brother    Kidney failure Brother    Alcohol abuse Maternal Grandmother     Social History Social History   Tobacco Use   Smoking status: Never Smoker   Smokeless tobacco: Never Used  Substance Use Topics   Alcohol use: Yes    Comment: occasionally    Drug use: Not Currently    Frequency: 1.0 times per week    Types: Cocaine     Allergies   Patient has no known allergies.   Review of Systems Review of Systems  All other systems reviewed and are negative.    Physical Exam Updated Vital Signs BP (!) 143/97    Pulse (!) 118    Temp 99.2 F (37.3 C) (Oral)    Resp (!) 21    Ht 1.778 m (5\' 10" )    Wt 62.4 kg    SpO2 100%    BMI 19.74  kg/m   Physical Exam Vitals signs and nursing note reviewed.  Constitutional:      General: She is not in acute distress.    Appearance: She is well-developed.  HENT:     Head: Normocephalic and atraumatic.     Comments: No evidence of tongue injury    Right Ear: External ear normal.     Left Ear: External ear normal.  Eyes:     General: No scleral icterus.       Right eye: No discharge.        Left eye: No discharge.     Conjunctiva/sclera: Conjunctivae normal.  Neck:     Musculoskeletal: Neck supple.     Trachea: No tracheal deviation.  Cardiovascular:     Rate and Rhythm: Normal rate and regular rhythm.  Pulmonary:     Effort: Pulmonary effort is normal. No respiratory distress.     Breath sounds: Normal breath sounds. No stridor. No wheezing or rales.  Abdominal:     General: Bowel sounds are normal. There is no distension.     Palpations: Abdomen is soft.     Tenderness: There is no abdominal tenderness. There is no guarding or rebound.  Musculoskeletal:        General: No tenderness.  Skin:    General: Skin is warm and dry.     Findings: No rash.  Neurological:     Mental Status: She is alert.     GCS: GCS eye subscore is 4. GCS verbal subscore is 4. GCS motor subscore is 6.     Cranial Nerves: No cranial nerve deficit (no facial droop, extraocular movements intact, no slurred speech).     Sensory: No sensory deficit.     Motor: No abnormal muscle tone or seizure activity.     Coordination: Coordination normal.     Comments: Patient is confused about the date but follows commands and answer questions appropriately otherwise      ED Treatments / Results  Labs (all labs ordered are listed, but only abnormal results are displayed) Labs Reviewed  CBC WITH DIFFERENTIAL/PLATELET - Abnormal; Notable for the following components:      Result Value   WBC 24.7 (*)    RBC 3.05 (*)    Hemoglobin 10.6 (*)    HCT 32.7 (*)    MCV 107.2 (*)    MCH 34.8 (*)    RDW 15.6  (*)    Platelets 147 (*)    Neutro Abs 21.6 (*)    Monocytes Absolute  1.2 (*)    Abs Immature Granulocytes 0.27 (*)    All other components within normal limits  SARS CORONAVIRUS 2 (HOSPITAL ORDER, [facility])  ETHANOL  COMPREHENSIVE METABOLIC PANEL  RAPID URINE DRUG SCREEN, HOSP PERFORMED  URINALYSIS, ROUTINE W REFLEX MICROSCOPIC  CBG MONITORING, ED    EKG None  Radiology No results found.  Procedures Procedures (including critical care time)  Medications Ordered in ED Medications  sodium chloride 0.9 % bolus 1,000 mL (1,000 mLs Intravenous New Bag/Given 10/15/18 1805)    Followed by  0.9 %  sodium chloride infusion (has no administration in time range)  LORazepam (ATIVAN) injection 1 mg (1 mg Intravenous Given 10/15/18 1809)     Initial Impression / Assessment and Plan / ED Course  I have reviewed the triage vital signs and the nursing notes.  Pertinent labs & imaging results that were available during my care of the patient were reviewed by me and considered in my medical decision making (see chart for details).  Clinical Course as of Oct 14 1898  Wed Oct 15, 2018  1849 CBC WITH DIFFERENTIAL(!) [JK]  1849 White blood cell count notable for significant leukocytosis.  Anemia stable.   [JK]    Clinical Course User Index [JK] Dorie Rank, MD      Pt presents with probable seizure. In the ED no meningismus.  Pt is alert.   WBC elevation could be stress demargination but certainly infection is a concern.  Will check UA, CXR, covid.  CT head.  Will continue to monitor.  May need admission for observation.  PA Ward will follow up on results and dispo accordingly Final Clinical Impressions(s) / ED Diagnoses  Seizure disorder, leukocytosis   Dorie Rank, MD 10/15/18 1902

## 2018-10-15 NOTE — ED Notes (Signed)
Seizure pads on bed

## 2018-10-15 NOTE — ED Triage Notes (Signed)
Pt here via [facility], was sitting at bus stop, bystanders witnessed it and said it lasted a minute, has a hx of.  Did not fall or hit head. Alert to self upon EMS arrival. Seen here on the 4th.

## 2018-10-15 NOTE — ED Provider Notes (Signed)
Care assumed from previous provider Dr. Tomi Bamberger. Please see note for further details. Case discussed, plan agreed upon. Briefly, patient is a 63 year old female who presented to the emergency department for likely seizure.  She was at the bus stop today when bystanders witnessed shaking episode and she was not responding.  No focal neuro deficits on initial exam by EDP.  Did state that she seemed a little confused, did not know the date, but otherwise was oriented.  CBC resulted with white count of 24.7.  Will follow up on CMP, UA, UDS, CT head, chest x-ray.  Anticipating admission for likely seizure.  CT head without acute findings. CXR normal.   CMP with multiple abnormalities including hypokalemia at 3.1, co2 of 12, AG at 32. Suspect AG 2/2 seizure. Elevated AST and alk phos similar to values 3 weeks ago.   UA without signs of infection.  UDS positive for cocaine.  Hospitalist consulted who will admit for ongoing care.   Ward, Ozella Almond, PA-C 10/16/18 1034    Valarie Merino, MD 10/21/18 1455

## 2018-10-15 NOTE — ED Notes (Addendum)
ED TO INPATIENT HANDOFF REPORT  ED Nurse Name and Phone #:  Lonn Georgia [phone]  S Name/Age/Gender Christie Morrison 63 y.o. female Room/Bed: 043C/043C  Code Status   Code Status: Prior  Home/SNF/Other Home Patient oriented to: self, place and situation Is this baseline? No   Triage Complete: Triage complete  Chief Complaint SEIZURE  Triage Note Pt here via [facility], was sitting at bus stop, bystanders witnessed it and said it lasted a minute, has a hx of.  Did not fall or hit head. Alert to self upon EMS arrival. Seen here on the 4th.    Allergies No Known Allergies  Level of Care/Admitting Diagnosis ED Disposition    ED Disposition Condition [facility] Area: [place] [[id number]]  Level of Care: Telemetry Medical [104]  I expect the patient will be discharged within 24 hours: Yes  LOW acuity---Tx typically complete <24 hrs---ACUTE conditions typically can be evaluated <24 hours---LABS likely to return to acceptable levels <24 hours---IS near functional baseline---EXPECTED to return to current living arrangement---NOT newly hypoxic: Meets criteria for 5C-Observation unit  Covid Evaluation: Confirmed COVID Negative  Diagnosis: Seizure-like activity [facility]) [[id number]]  Admitting Physician: Lenore Cordia [[id number]]  Attending Physician: Lenore Cordia [[id number]]  PT Class (Do Not Modify): Observation [104]  PT Acc Code (Do Not Modify): Observation [10022]       B Medical/Surgery History Past Medical History:  Diagnosis Date  . Acute hepatitis   . Cirrhosis ([place])   . Crack cocaine use   . Duodenitis   . ETOH abuse   . High cholesterol   . Mallory-Weiss tear   . Seizures ([place])    Past Surgical History:  Procedure Laterality Date  . CESAREAN SECTION       A IV Location/Drains/Wounds Patient Lines/Drains/Airways Status   Active Line/Drains/Airways    Name:   Placement date:   Placement time:   Site:   Days:   Peripheral IV 10/15/18  Right Antecubital   10/15/18    1803    Antecubital   less than 1          Intake/Output Last 24 hours No intake or output data in the 24 hours ending 10/15/18 2216  Labs/Imaging Results for orders placed or performed during the hospital encounter of 10/15/18 (from the past 48 hour(s))  CBG monitoring, ED     Status: None   Collection Time: 10/15/18  6:01 PM  Result Value Ref Range   Glucose-Capillary 98 70 - 99 mg/dL  Comprehensive metabolic panel     Status: Abnormal   Collection Time: 10/15/18  6:02 PM  Result Value Ref Range   Sodium 135 135 - 145 mmol/L   Potassium 3.1 (L) 3.5 - 5.1 mmol/L   Chloride 91 (L) 98 - 111 mmol/L   CO2 12 (L) 22 - 32 mmol/L   Glucose, Bld 102 (H) 70 - 99 mg/dL   BUN 10 8 - 23 mg/dL   Creatinine, Ser 1.78 (H) 0.44 - 1.00 mg/dL   Calcium 6.7 (L) 8.9 - 10.3 mg/dL   Total Protein 7.7 6.5 - 8.1 g/dL   Albumin 3.7 3.5 - 5.0 g/dL   AST 93 (H) 15 - 41 U/L   ALT 23 0 - 44 U/L    Comment: RESULTS CONFIRMED BY MANUAL DILUTION   Alkaline Phosphatase 178 (H) 38 - 126 U/L   Total Bilirubin 1.6 (H) 0.3 - 1.2 mg/dL   GFR calc non Af Amer 30 (  L) >60 mL/min   GFR calc Af Amer 35 (L) >60 mL/min   Anion gap 32 (H) 5 - 15    Comment: Performed at [facility] [address], [place], [place] [address]  Ethanol/ETOH     Status: None   Collection Time: 10/15/18  6:02 PM  Result Value Ref Range   Alcohol, Ethyl (B) <10 <10 mg/dL    Comment: (NOTE) Lowest detectable limit for serum alcohol is 10 mg/dL. For medical purposes only. Performed at [facility], [place] [address] [place], [place] [address]   CBC WITH DIFFERENTIAL     Status: Abnormal   Collection Time: 10/15/18  6:02 PM  Result Value Ref Range   WBC 24.7 (H) 4.0 - 10.5 K/uL   RBC 3.05 (L) 3.87 - 5.11 MIL/uL   Hemoglobin 10.6 (L) 12.0 - 15.0 g/dL   HCT 32.7 (L) 36.0 - 46.0 %   MCV 107.2 (H) 80.0 - 100.0 fL   MCH 34.8 (H) 26.0 - 34.0 pg   MCHC 32.4 30.0 - 36.0 g/dL   RDW 15.6 (H) 11.5  - 15.5 %   Platelets 147 (L) 150 - 400 K/uL    Comment: REPEATED TO VERIFY   nRBC 0.2 0.0 - 0.2 %   Neutrophils Relative % 88 %   Neutro Abs 21.6 (H) 1.7 - 7.7 K/uL   Lymphocytes Relative 5 %   Lymphs Abs 1.2 0.7 - 4.0 K/uL   Monocytes Relative 5 %   Monocytes Absolute 1.2 (H) 0.1 - 1.0 K/uL   Eosinophils Relative 1 %   Eosinophils Absolute 0.3 0.0 - 0.5 K/uL   Basophils Relative 0 %   Basophils Absolute 0.1 0.0 - 0.1 K/uL   Immature Granulocytes 1 %   Abs Immature Granulocytes 0.27 (H) 0.00 - 0.07 K/uL    Comment: Performed at [facility], [place] [address] [place], [place] [address]  SARS Coronavirus 2 (CEPHEID - Performed in [facility] hospital lab), Hosp Order     Status: None   Collection Time: 10/15/18  7:40 PM  Result Value Ref Range   SARS Coronavirus 2 NEGATIVE NEGATIVE    Comment: (NOTE) If result is NEGATIVE SARS-CoV-2 target nucleic acids are NOT DETECTED. The SARS-CoV-2 RNA is generally detectable in upper and lower  respiratory specimens during the acute phase of infection. The lowest  concentration of SARS-CoV-2 viral copies this assay can detect is 250  copies / mL. A negative result does not preclude SARS-CoV-2 infection  and should not be used as the sole basis for treatment or other  patient management decisions.  A negative result may occur with  improper specimen collection / handling, submission of specimen other  than nasopharyngeal swab, presence of viral mutation(s) within the  areas targeted by this assay, and inadequate number of viral copies  (<250 copies / mL). A negative result must be combined with clinical  observations, patient history, and epidemiological information. If result is POSITIVE SARS-CoV-2 target nucleic acids are DETECTED. The SARS-CoV-2 RNA is generally detectable in upper and lower  respiratory specimens dur ing the acute phase of infection.  Positive  results are indicative of active infection with SARS-CoV-2.  Clinical   correlation with patient history and other diagnostic information is  necessary to determine patient infection status.  Positive results do  not rule out bacterial infection or co-infection with other viruses. If result is PRESUMPTIVE POSTIVE SARS-CoV-2 nucleic acids MAY BE PRESENT.   A presumptive  positive result was obtained on the submitted specimen  and confirmed on repeat testing.  While 2019 novel coronavirus  (SARS-CoV-2) nucleic acids may be present in the submitted sample  additional confirmatory testing may be necessary for epidemiological  and / or clinical management purposes  to differentiate between  SARS-CoV-2 and other Sarbecovirus currently known to infect humans.  If clinically indicated additional testing with an alternate test  methodology [phone]) is advised. The SARS-CoV-2 RNA is generally  detectable in upper and lower respiratory sp ecimens during the acute  phase of infection. The expected result is Negative. Fact Sheet for Patients:  StrictlyIdeas.no Fact Sheet for Healthcare Providers: BankingDealers.co.za This test is not yet approved or cleared by the [place] FDA and has been authorized for detection and/or diagnosis of SARS-CoV-2 by FDA under an Emergency Use Authorization (EUA).  This EUA will remain in effect (meaning this test can be used) for the duration of the COVID-19 declaration under Section 564(b)(1) of the Act, 21 U.S.C. section 360bbb-3(b)(1), unless the authorization is terminated or revoked sooner. Performed at [facility], [place] [address], [place], [place] [address]   Urinalysis, Routine w reflex microscopic     Status: Abnormal   Collection Time: 10/15/18  8:50 PM  Result Value Ref Range   Color, Urine AMBER (A) YELLOW    Comment: BIOCHEMICALS MAY BE AFFECTED BY COLOR   APPearance CLOUDY (A) CLEAR   Specific Gravity, Urine 1.021 1.005 - 1.030   pH 5.0 5.0 - 8.0   Glucose,  UA 50 (A) NEGATIVE mg/dL   Hgb urine dipstick SMALL (A) NEGATIVE   Bilirubin Urine SMALL (A) NEGATIVE   Ketones, ur 20 (A) NEGATIVE mg/dL   Protein, ur 100 (A) NEGATIVE mg/dL   Nitrite NEGATIVE NEGATIVE   Leukocytes,Ua NEGATIVE NEGATIVE   RBC / HPF 0-5 0 - 5 RBC/hpf   WBC, UA 6-10 0 - 5 WBC/hpf   Bacteria, UA FEW (A) NONE SEEN   Squamous Epithelial / LPF 0-5 0 - 5   Mucus PRESENT    Hyaline Casts, UA PRESENT     Comment: Performed at [facility], [address] [place], [place] [address]   Ct Head Wo Contrast  Result Date: 10/15/2018 CLINICAL DATA:  Near syncope today. EXAM: CT HEAD WITHOUT CONTRAST TECHNIQUE: Contiguous axial images were obtained from the base of the skull through the vertex without intravenous contrast. COMPARISON:  Brain MRI and head CT scans 09/22/2018. FINDINGS: Brain: No evidence of acute infarction, hemorrhage, hydrocephalus, extra-axial collection or mass lesion/mass effect. Mild chronic microvascular ischemic change and cortical atrophy noted. Vascular: Atherosclerosis is identified. Skull: Intact.  No focal lesion. Sinuses/Orbits: Minimal mucosal thickening left maxillary sinus noted. Other: None. IMPRESSION: No acute abnormality. Atrophy and chronic microvascular ischemic change. Atherosclerosis. Electronically Signed   By: Inge Rise M.D.   On: 10/15/2018 19:36   Dg Chest Portable 1 View  Result Date: 10/15/2018 CLINICAL DATA:  Elevated white count EXAM: PORTABLE CHEST 1 VIEW COMPARISON:  06/09/2016 FINDINGS: The heart size and mediastinal contours are within normal limits. Both lungs are clear. The visualized skeletal structures are unremarkable. Mild aortic atherosclerosis. IMPRESSION: No active disease. Electronically Signed   By: Donavan Foil M.D.   On: 10/15/2018 20:30    Pending Labs Unresulted Labs (From admission, onward)    Start     Ordered   10/15/18 2204  Magnesium  Add-on,   R     10/15/18 2203   10/15/18 1752  Rapid urine drug  screen (hospital performed)  ONCE - STAT,   R     10/15/18 1752          Vitals/Pain Today's Vitals   10/15/18 2015 10/15/18 2030 10/15/18 2214 10/15/18 2215  BP: 114/90 (!) 144/64 (!) 150/96   Pulse:  100  97  Resp: 20 (!) 28    Temp:      TempSrc:      SpO2: 99% 99%  100%  Weight:      Height:        Isolation Precautions No active isolations  Medications Medications  sodium chloride 0.9 % bolus 1,000 mL (0 mLs Intravenous Stopped 10/15/18 1932)    Followed by  0.9 %  sodium chloride infusion (0 mLs Intravenous Stopped 10/15/18 2056)  LORazepam (ATIVAN) injection 1 mg (1 mg Intravenous Given 10/15/18 1809)  potassium chloride SA (K-DUR) CR tablet 40 mEq (40 mEq Oral Given 10/15/18 2110)    Mobility walks High fall risk   Focused Assessments Neuro Assessment Handoff:   Cardiac Rhythm: Normal sinus rhythm      R Recommendations: See Admitting Provider Note  Report given to: Colletta [place] RN  Additional Notes:

## 2018-10-16 ENCOUNTER — Inpatient Hospital Stay (HOSPITAL_COMMUNITY): Payer: Self-pay

## 2018-10-16 DIAGNOSIS — R55 Syncope and collapse: Secondary | ICD-10-CM

## 2018-10-16 DIAGNOSIS — R569 Unspecified convulsions: Principal | ICD-10-CM

## 2018-10-16 LAB — COMPREHENSIVE METABOLIC PANEL
ALT: 18 U/L (ref 0–44)
AST: 60 U/L — ABNORMAL HIGH (ref 15–41)
Albumin: 3.3 g/dL — ABNORMAL LOW (ref 3.5–5.0)
Alkaline Phosphatase: 161 U/L — ABNORMAL HIGH (ref 38–126)
Anion gap: 15 (ref 5–15)
BUN: 10 mg/dL (ref 8–23)
CO2: 25 mmol/L (ref 22–32)
Calcium: 6.2 mg/dL — CL (ref 8.9–10.3)
Chloride: 96 mmol/L — ABNORMAL LOW (ref 98–111)
Creatinine, Ser: 1.44 mg/dL — ABNORMAL HIGH (ref 0.44–1.00)
GFR calc Af Amer: 45 mL/min — ABNORMAL LOW (ref >60)
GFR calc non Af Amer: 39 mL/min — ABNORMAL LOW (ref >60)
Glucose, Bld: 108 mg/dL — ABNORMAL HIGH (ref 70–99)
Potassium: 2.8 mmol/L — ABNORMAL LOW (ref 3.5–5.1)
Sodium: 136 mmol/L (ref 135–145)
Total Bilirubin: 1.3 mg/dL — ABNORMAL HIGH (ref 0.3–1.2)
Total Protein: 6.6 g/dL (ref 6.5–8.1)

## 2018-10-16 LAB — GLUCOSE, CAPILLARY: Glucose-Capillary: 111 mg/dL — ABNORMAL HIGH (ref 70–99)

## 2018-10-16 LAB — ECHOCARDIOGRAM COMPLETE
Height: 70 in
Weight: 2148.16 oz

## 2018-10-16 LAB — CBC
HCT: 25.5 % — ABNORMAL LOW (ref 36.0–46.0)
Hemoglobin: 8.8 g/dL — ABNORMAL LOW (ref 12.0–15.0)
MCH: 34.6 pg — ABNORMAL HIGH (ref 26.0–34.0)
MCHC: 34.5 g/dL (ref 30.0–36.0)
MCV: 100.4 fL — ABNORMAL HIGH (ref 80.0–100.0)
Platelets: 118 10*3/uL — ABNORMAL LOW (ref 150–400)
RBC: 2.54 MIL/uL — ABNORMAL LOW (ref 3.87–5.11)
RDW: 15.2 % (ref 11.5–15.5)
WBC: 23.8 10*3/uL — ABNORMAL HIGH (ref 4.0–10.5)
nRBC: 0 % (ref 0.0–0.2)

## 2018-10-16 LAB — MAGNESIUM: Magnesium: 1 mg/dL — ABNORMAL LOW (ref 1.7–2.4)

## 2018-10-16 LAB — HIV ANTIBODY (ROUTINE TESTING W REFLEX): HIV Screen 4th Generation wRfx: NONREACTIVE

## 2018-10-16 MED ORDER — SODIUM CHLORIDE 0.9% FLUSH: 10.0000 mL | Freq: Two times a day (BID) | INTRAVENOUS | Status: DC

## 2018-10-16 MED ORDER — ENSURE ENLIVE PO LIQD
237.0000 mL | Freq: Two times a day (BID) | ORAL | Status: DC
  Administered 2018-10-16 – 2018-10-17 (×2): 237 mL via ORAL

## 2018-10-16 MED ORDER — POTASSIUM CHLORIDE CRYS ER 20 MEQ PO TBCR
40.0000 meq | EXTENDED_RELEASE_TABLET | ORAL | Status: AC
  Administered 2018-10-16 (×3): 40 meq via ORAL
  Filled 2018-10-16 (×3): qty 2

## 2018-10-16 MED ORDER — SODIUM CHLORIDE 0.9% FLUSH: 10.0000 mL | INTRAVENOUS | Status: DC | PRN

## 2018-10-16 MED ORDER — CALCIUM GLUCONATE-NACL 1-0.675 GM/50ML-% IV SOLN
1.0000 g | Freq: Once | INTRAVENOUS | Status: AC
  Administered 2018-10-16: 1000 mg via INTRAVENOUS
  Filled 2018-10-16: qty 50

## 2018-10-16 MED ORDER — HALOPERIDOL LACTATE 5 MG/ML IJ SOLN
5.0000 mg | Freq: Once | INTRAMUSCULAR | Status: AC
  Administered 2018-10-16: 5 mg via INTRAVENOUS
  Filled 2018-10-16: qty 1

## 2018-10-16 MED ORDER — MAGNESIUM SULFATE 2 GM/50ML IV SOLN
2.0000 g | INTRAVENOUS | Status: AC
  Administered 2018-10-16 (×2): 2 g via INTRAVENOUS
  Filled 2018-10-16 (×2): qty 50

## 2018-10-16 MED ORDER — SODIUM CHLORIDE 0.9 % IV SOLN
INTRAVENOUS | Status: DC | PRN
  Administered 2018-10-16: 250 mL via INTRAVENOUS

## 2018-10-16 NOTE — TOC Initial Note (Signed)
Transition of Care [facility]) - Initial/Assessment Note    Patient Details  Name: Christie Morrison MRN: [id number] Date of Birth: 16-Apr-1956  Transition of Care [facility]) CM/SW Contact:    Pollie Friar, RN Phone Number: 10/16/2018, 3:48 PM  Clinical Narrative:                 Pt uses the bus for most of her transportation. [facility] even though she goes [facility]. I encouraged her to use [facility] pharmacy for medication assistance.  Pt may need medication assistance at d/c. CM following.  Expected Discharge Plan: Home/Self Care Barriers to Discharge: Continued Medical Work up   Patient Goals and CMS Choice        Expected Discharge Plan and Services Expected Discharge Plan: Home/Self Care       Living arrangements for the past 2 months: Apartment(ground level)                                      Prior Living Arrangements/Services Living arrangements for the past 2 months: Apartment(ground level) Lives with:: Adult Children, Roommate(son ) Patient language and need for interpreter reviewed:: Yes(no needs) Do you feel safe going back to the place where you live?: Yes            Criminal Activity/Legal Involvement Pertinent to Current Situation/Hospitalization: No - Comment as needed  Activities of Daily Living Home Assistive Devices/Equipment: None ADL Screening (condition at time of admission) Patient's cognitive ability adequate to safely complete daily activities?: Yes Is the patient deaf or have difficulty hearing?: No Does the patient have difficulty seeing, even when wearing glasses/contacts?: No Does the patient have difficulty concentrating, remembering, or making decisions?: Yes Patient able to express need for assistance with ADLs?: Yes Does the patient have difficulty dressing or bathing?: No Independently performs ADLs?: Yes (appropriate for developmental age) Does the patient have difficulty walking or climbing stairs?: No Weakness  of Legs: Both Weakness of Arms/Hands: Both  Permission Sought/Granted                  Emotional Assessment Appearance:: Appears stated age Attitude/Demeanor/Rapport: Engaged Affect (typically observed): Accepting Orientation: : Oriented to Self, Oriented to Place Alcohol / Substance Use: Alcohol Use, Illicit Drugs Psych Involvement: No (comment)  Admission diagnosis:  Seizure-like activity [facility]) [R56.9] Patient Active Problem List   Diagnosis Date Noted  . Seizure-like activity ([place]) 10/15/2018  . Acute-on-chronic kidney injury ([place]) 10/15/2018  . Hypokalemia 10/15/2018  . Cirrhosis, alcoholic ([place])   . Acute hepatitis   . Dehydration, severe 06/08/2016  . Elevated lactic acid level 06/08/2016  . Alcohol abuse 06/08/2016  . Melena 06/08/2016  . Cocaine abuse ([place]) 06/08/2016  . Thrombocytopenia ([place]) 06/08/2016  . Acute hypernatremia 06/08/2016  . Acute alcohol intoxication ([place]) 06/08/2016   PCP:  Gildardo Pounds, NP Pharmacy:   [facility], [place] [address] [place] [address] Phone: [phone] Fax: [place], [place] - [address] [place] [address] Phone: [phone] Fax: [phone]     Social Determinants of Health (SDOH) Interventions    Readmission Risk Interventions Readmission Risk Prevention Plan 10/16/2018  Transportation Screening Complete  PCP or Specialist Appt within 3-5 Days (No Data)  Palliative Care Screening Not Applicable  Medication Review (RN Transport planner) Referral to Pharmacy  Some recent data might be hidden

## 2018-10-16 NOTE — Progress Notes (Signed)
PROGRESS NOTE    Christie Morrison  HDQ:[id number] DOB: 06-10-1955 DOA: 10/15/2018 PCP: Gildardo Pounds, NP    Brief Narrative:   Christie Morrison is a 63 y.o. female with medical history significant for cirrhosis, alcohol use, and seizure-like episodes who was brought to the ED by EMS for reported seizure-like episode while waiting at a bus stop.  Patient states that she normally takes potassium and magnesium supplements which she ran out of about 4 days ago.  She says she has not been eating well since that time either.  She has had intermittent lightheadedness.  She was at the bus stop earlier today when she noticed having cramping in both of her legs and became diaphoretic.  She does not recall what happened next, just arriving in the hospital.  Per ED documentation she had a witnessed shaking spell and was unresponsive for approximately 1-2 minutes.  Per report she did not fall or have any injury.  EMS were called and she was reportedly near her baseline with some confusion.  She does not recall any chest pain, dyspnea, fevers, abdominal pain, nausea, or vomiting just prior to this episode.  She reports regular daily bowel movements without diarrhea.  She denies any dysuria.  She is not taking any other medications regularly other than potassium and magnesium supplements.  She reports chronic alcohol use, drinking about one 12 ounce beer per day with last drink same day of admission.  She denies any history of alcohol withdrawal.  She denies any tobacco use.  She reports a history of marijuana use and denies any cocaine or IV drug use.  ED Course:  Initial vitals showed BP 143/97, pulse 122, RR 22, temp 99.2 Fahrenheit, SPO2 100% on room air. Labs are notable for WBC 24.7 (18.8 on 09/22/2018), hemoglobin 10.6, platelets 147,000, potassium 3.1, serum glucose 102, BUN 10, serum creatinine 1.78 (baseline around 1.2), AST 93, ALT 23, alk phos 178, total bilirubin 1.6, serum ethanol level was  undetectable. Urinalysis was not suggestive of UTI.  UDS was positive for cocaine.  SARS-CoV-2 test was negative.Portable chest x-ray was negative for focal consolidation, edema, or effusion.  CT head without contrast was negative for acute abnormality.  Patient was given 1 L normal saline, IV Ativan 1 mg once, and oral KDur 40 mEq once. The hospitalist service was consulted to admit for further evaluation and management.  Assessment & Plan:   Principal Problem:   Seizure-like activity ([place]) Active Problems:   Alcohol abuse   Cirrhosis, alcoholic ([place])   Acute-on-chronic kidney injury ([place])   Hypokalemia   Syncope versus seizure-like activity versus alcohol withdrawal Patient presenting following reported syncopal versus seizure-like activity.  Has history of similar episodes in the past.  Previously seen by neurology with normal EEG in June 2019.  Also likely exacerbated by her history of substance abuse with EtOH and noted positive cocaine on UDS. --continue to monitor on telemetry --EEG pending --check TTE --Seizure precautions --Place on CIWA protocol --Discussed with patient need for cessation of polysubstance abuse, although she denies cocaine use  Acute on chronic stage II kidney injury Creatinine 1.78 on admission.  Etiology likely prerenal from poor oral intake.  --Cr 1.78-->1.44 --Continue IV fluid hydration --Avoid nephrotoxins, renally dose all medications --Daily BMP  Hypokalemia Potassium 2.8 with magnesium 1.0.  Will replete once again more aggressively. --Repeat electrolytes with magnesium in the a.m.  Leukocytosis WBC 24.7 compared to 18.8 on 09/22/2018.  No obvious infectious source.  Possibly reactive  in setting of possible seizure activity. --WBC 24.7-->23.8 --Continue to monitor  Cirrhosis of the liver LFTs stable to improving compared to recent labs.  She has no abdominal pain or overt signs/symptoms of decompensated cirrhosis.  Continue to monitor.   Alcohol use Patient reports drinking 1 beer daily.  Son reports significant liquor use with last drink 3 days prior to admission.  She is at high risk for alcohol withdrawal. --continue CIWA protocol   DVT prophylaxis: Heparin Code Status: Full code Family Communication: none Disposition Plan: Continue observation status, pending EEG, further depending on clinical course, hopeful for discharge tomorrow   Consultants:   none  Procedures:   EEG: Pending  Antimicrobials:   None   Subjective: Patient seen and examined at bedside, requesting to use the restroom.  Discussed with patient need for cessation from substance abuse and to include alcohol cocaine.  Denies cocaine use.  States has history of seizures in the past related to alcohol withdrawal.  No other complaints at this time.  Denies headache, no fever/chills/night sweats, no nausea/vomiting/diarrhea, no cough/congestion, no shortness of breath, no chest pain, no palpitations, no abdominal pain, no weakness, no paresthesia.  No acute events overnight per nursing.  Objective: Vitals:   10/16/18 0319 10/16/18 0500 10/16/18 0842 10/16/18 1206  BP: (!) 145/95  (!) 146/97 (!) 163/103  Pulse: 92  (!) 103 98  Resp: 18  19 18   Temp: 100 F (37.8 C)  100.2 F (37.9 C) 98.8 F (37.1 C)  TempSrc: Oral  Oral Oral  SpO2: 100%  100% 100%  Weight:  60.9 kg    Height:        Intake/Output Summary (Last 24 hours) at 10/16/2018 1238 Last data filed at 10/16/2018 0500 Gross per 24 hour  Intake 657.03 ml  Output -  Net 657.03 ml   Filed Weights   10/15/18 1746 10/16/18 0008 10/16/18 0500  Weight: 62.4 kg 60.7 kg 60.9 kg    Examination:  General exam: Appears calm and comfortable  Respiratory system: Clear to auscultation. Respiratory effort normal. Cardiovascular system: S1 & S2 heard, RRR. No JVD, murmurs, rubs, gallops or clicks. No pedal edema. Gastrointestinal system: Abdomen is nondistended, soft and nontender. No  organomegaly or masses felt. Normal bowel sounds heard. Central nervous system: Alert and oriented. No focal neurological deficits. Extremities: Symmetric 5 x 5 power. Skin: No rashes, lesions or ulcers Psychiatry: Judgement and insight appear normal. Mood & affect appropriate.     Data Reviewed: I have personally reviewed following labs and imaging studies  CBC: Recent Labs  Lab 10/15/18 1802 10/16/18 0348  WBC 24.7* 23.8*  NEUTROABS 21.6*  --   HGB 10.6* 8.8*  HCT 32.7* 25.5*  MCV 107.2* 100.4*  PLT 147* 063*   Basic Metabolic Panel: Recent Labs  Lab 10/15/18 1802 10/15/18 2255 10/16/18 0348  NA 135  --  136  K 3.1*  --  2.8*  CL 91*  --  96*  CO2 12*  --  25  GLUCOSE 102*  --  108*  BUN 10  --  10  CREATININE 1.78*  --  1.44*  CALCIUM 6.7*  --  6.2*  MG  --  1.0*  --    GFR: Estimated Creatinine Clearance: 38.9 mL/min (A) (by C-G formula based on SCr of 1.44 mg/dL (H)). Liver Function Tests: Recent Labs  Lab 10/15/18 1802 10/16/18 0348  AST 93* 60*  ALT 23 18  ALKPHOS 178* 161*  BILITOT 1.6* 1.3*  PROT  7.7 6.6  ALBUMIN 3.7 3.3*   No results for input(s): LIPASE, AMYLASE in the last 168 hours. No results for input(s): AMMONIA in the last 168 hours. Coagulation Profile: No results for input(s): INR, PROTIME in the last 168 hours. Cardiac Enzymes: No results for input(s): CKTOTAL, CKMB, CKMBINDEX, TROPONINI in the last 168 hours. BNP (last 3 results) No results for input(s): PROBNP in the last 8760 hours. HbA1C: No results for input(s): HGBA1C in the last 72 hours. CBG: Recent Labs  Lab 10/15/18 1801 10/16/18 0650  GLUCAP 98 111*   Lipid Profile: No results for input(s): CHOL, HDL, LDLCALC, TRIG, CHOLHDL, LDLDIRECT in the last 72 hours. Thyroid Function Tests: No results for input(s): TSH, T4TOTAL, FREET4, T3FREE, THYROIDAB in the last 72 hours. Anemia Panel: No results for input(s): VITAMINB12, FOLATE, FERRITIN, TIBC, IRON, RETICCTPCT in the  last 72 hours. Sepsis Labs: No results for input(s): PROCALCITON, LATICACIDVEN in the last 168 hours.  Recent Results (from the past 240 hour(s))  SARS Coronavirus 2 (CEPHEID - Performed in [facility] hospital lab), Hosp Order     Status: None   Collection Time: 10/15/18  7:40 PM  Result Value Ref Range Status   SARS Coronavirus 2 NEGATIVE NEGATIVE Final    Comment: (NOTE) If result is NEGATIVE SARS-CoV-2 target nucleic acids are NOT DETECTED. The SARS-CoV-2 RNA is generally detectable in upper and lower  respiratory specimens during the acute phase of infection. The lowest  concentration of SARS-CoV-2 viral copies this assay can detect is 250  copies / mL. A negative result does not preclude SARS-CoV-2 infection  and should not be used as the sole basis for treatment or other  patient management decisions.  A negative result may occur with  improper specimen collection / handling, submission of specimen other  than nasopharyngeal swab, presence of viral mutation(s) within the  areas targeted by this assay, and inadequate number of viral copies  (<250 copies / mL). A negative result must be combined with clinical  observations, patient history, and epidemiological information. If result is POSITIVE SARS-CoV-2 target nucleic acids are DETECTED. The SARS-CoV-2 RNA is generally detectable in upper and lower  respiratory specimens dur ing the acute phase of infection.  Positive  results are indicative of active infection with SARS-CoV-2.  Clinical  correlation with patient history and other diagnostic information is  necessary to determine patient infection status.  Positive results do  not rule out bacterial infection or co-infection with other viruses. If result is PRESUMPTIVE POSTIVE SARS-CoV-2 nucleic acids MAY BE PRESENT.   A presumptive positive result was obtained on the submitted specimen  and confirmed on repeat testing.  While 2019 novel coronavirus  (SARS-CoV-2) nucleic  acids may be present in the submitted sample  additional confirmatory testing may be necessary for epidemiological  and / or clinical management purposes  to differentiate between  SARS-CoV-2 and other Sarbecovirus currently known to infect humans.  If clinically indicated additional testing with an alternate test  methodology [phone]) is advised. The SARS-CoV-2 RNA is generally  detectable in upper and lower respiratory sp ecimens during the acute  phase of infection. The expected result is Negative. Fact Sheet for Patients:  StrictlyIdeas.no Fact Sheet for Healthcare Providers: BankingDealers.co.za This test is not yet approved or cleared by the [place] FDA and has been authorized for detection and/or diagnosis of SARS-CoV-2 by FDA under an Emergency Use Authorization (EUA).  This EUA will remain in effect (meaning this test can be used) for the  duration of the COVID-19 declaration under Section 564(b)(1) of the Act, 21 U.S.C. section 360bbb-3(b)(1), unless the authorization is terminated or revoked sooner. Performed at [facility], [place] [address] [place], [place] [address]          Radiology Studies: Ct Head Wo Contrast  Result Date: 10/15/2018 CLINICAL DATA:  Near syncope today. EXAM: CT HEAD WITHOUT CONTRAST TECHNIQUE: Contiguous axial images were obtained from the base of the skull through the vertex without intravenous contrast. COMPARISON:  Brain MRI and head CT scans 09/22/2018. FINDINGS: Brain: No evidence of acute infarction, hemorrhage, hydrocephalus, extra-axial collection or mass lesion/mass effect. Mild chronic microvascular ischemic change and cortical atrophy noted. Vascular: Atherosclerosis is identified. Skull: Intact.  No focal lesion. Sinuses/Orbits: Minimal mucosal thickening left maxillary sinus noted. Other: None. IMPRESSION: No acute abnormality. Atrophy and chronic microvascular ischemic change.  Atherosclerosis. Electronically Signed   By: Inge Rise M.D.   On: 10/15/2018 19:36   Dg Chest Portable 1 View  Result Date: 10/15/2018 CLINICAL DATA:  Elevated white count EXAM: PORTABLE CHEST 1 VIEW COMPARISON:  06/09/2016 FINDINGS: The heart size and mediastinal contours are within normal limits. Both lungs are clear. The visualized skeletal structures are unremarkable. Mild aortic atherosclerosis. IMPRESSION: No active disease. Electronically Signed   By: Donavan Foil M.D.   On: 10/15/2018 20:30        Scheduled Meds: . folic acid  1 mg Oral Daily  . heparin  5,000 Units Subcutaneous Q8H  . multivitamin with minerals  1 tablet Oral Daily  . potassium chloride  40 mEq Oral Q3H  . sodium chloride flush  3 mL Intravenous Q12H  . thiamine  100 mg Oral Daily   Or  . thiamine  100 mg Intravenous Daily   Continuous Infusions: . magnesium sulfate bolus IVPB 2 g (10/16/18 1202)     LOS: 0 days    Time spent: 29 minutes    Eric J [place] DO [facility] Pager [phone]  If 7PM-7AM, please contact night-coverage [email or web address] Password [facility] 10/16/2018, 12:38 PM

## 2018-10-16 NOTE — Procedures (Signed)
  [place] A. Merlene Laughter, MD     [email or web address]           HISTORY: The patient is a 63 year old who presents with seizures.  There is a history of alcoholism and cirrhosis.  MEDICATIONS:  Current Facility-Administered Medications:  .  acetaminophen (TYLENOL) tablet 650 mg, 650 mg, Oral, Q6H PRN **OR** acetaminophen (TYLENOL) suppository 650 mg, 650 mg, Rectal, Q6H PRN, Posey Pronto, Vishal R, MD .  calcium gluconate 1 g/ 50 mL sodium chloride IVPB, 1 g, Intravenous, Once, [place] Eric J, DO .  feeding supplement (ENSURE ENLIVE) (ENSURE ENLIVE) liquid 237 mL, 237 mL, Oral, BID BM, [place] Eric J, DO, 237 mL at 10/16/18 1753 .  folic acid (FOLVITE) tablet 1 mg, 1 mg, Oral, Daily, Zada Finders R, MD, 1 mg at 10/16/18 0914 .  heparin injection 5,000 Units, 5,000 Units, Subcutaneous, Q8H, Lenore Cordia, MD, 5,000 Units at 10/16/18 1753 .  LORazepam (ATIVAN) tablet 1 mg, 1 mg, Oral, Q6H PRN, 1 mg at 10/16/18 1753 **OR** LORazepam (ATIVAN) injection 1 mg, 1 mg, Intravenous, Q6H PRN, Lenore Cordia, MD .  multivitamin with minerals tablet 1 tablet, 1 tablet, Oral, Daily, Lenore Cordia, MD, 1 tablet at 10/16/18 0914 .  ondansetron (ZOFRAN) tablet 4 mg, 4 mg, Oral, Q6H PRN **OR** ondansetron (ZOFRAN) injection 4 mg, 4 mg, Intravenous, Q6H PRN, Posey Pronto, Vishal R, MD .  sodium chloride flush (NS) 0.9 % injection 3 mL, 3 mL, Intravenous, Q12H, Patel, Vishal R, MD .  thiamine (VITAMIN B-1) tablet 100 mg, 100 mg, Oral, Daily, 100 mg at 10/16/18 0914 **OR** thiamine (B-1) injection 100 mg, 100 mg, Intravenous, Daily, Patel, Vishal R, MD     ANALYSIS: A 16 channel recording using standard 10 20 measurements is conducted for 21 minutes.  There is a well-formed posterior dominant rhythm of 12 Hz which attenuates with eye opening.  There is beta activity observed in the frontal areas.  Awake and drowsy architecture are observed.  There is normal myogenic interference seen throughout the  recording.  Photic stimulation is carried out without abnormal changes in the background activity.  There is no focal or lateralized slowing.  There is no epileptiform activity is observed.   IMPRESSION: 1.  This is a normal recording of awake and drowsy states.      Ailee Pates A. Merlene Laughter, M.D.  Diplomate, Tax adviser of Psychiatry and Neurology ( Neurology).

## 2018-10-16 NOTE — Progress Notes (Signed)
  Echocardiogram 2D Echocardiogram has been performed.  Christie Morrison 10/16/2018, 2:55 PM

## 2018-10-16 NOTE — Progress Notes (Signed)
Initial Nutrition Assessment   RD working remotely.  DOCUMENTATION CODES:   Not applicable  INTERVENTION:  Provide Ensure Enlive po BID, each supplement provides 350 kcal and 20 grams of protein.  Encourage adequate PO intake.  NUTRITION DIAGNOSIS:   Increased nutrient needs related to chronic illness(cirrhosis) as evidenced by estimated needs.  GOAL:   Patient will meet greater than or equal to 90% of their needs  MONITOR:   PO intake, Supplement acceptance, Labs, Weight trends, I & O's, Skin  REASON FOR ASSESSMENT:   Malnutrition Screening Tool    ASSESSMENT:   63 y.o. female with medical history significant for cirrhosis, alcoho/drug use presents with seizure-like episode   RD contacted pt via inpatient room phone. Pt reports having a good appetite currently and PTA. Pt reports her roommate cooks food for her and she usually eats or snacks on food throughout the whole day. Pt with no significant weight loss per weight records. RD to order nutritional supplements to aid in caloric and protein needs.   Unable to complete Nutrition-Focused physical exam at this time.   Labs and medications reviewed.  Diet Order:   Diet Order            Diet regular Room service appropriate? Yes; Fluid consistency: Thin  Diet effective now              EDUCATION NEEDS:   Not appropriate for education at this time  Skin:  Skin Assessment: Reviewed RN Assessment  Last BM:  Unknown  Height:   Ht Readings from Last 1 Encounters:  10/16/18 5\' 10"  (1.778 m)    Weight:   Wt Readings from Last 1 Encounters:  10/16/18 60.9 kg    Ideal Body Weight:  68 kg  BMI:  Body mass index is 19.26 kg/m.  Estimated Nutritional Needs:   Kcal:  1800-2000  Protein:  90-100 grams  Fluid:  1.8 - 2 L/day    Corrin Parker, MS, RD, LDN Pager # [phone] After hours/ weekend pager # [phone]

## 2018-10-16 NOTE — Progress Notes (Signed)
EEG Complete  Results Pending 

## 2018-10-17 LAB — BASIC METABOLIC PANEL
Anion gap: 11 (ref 5–15)
BUN: 9 mg/dL (ref 8–23)
CO2: 24 mmol/L (ref 22–32)
Calcium: 7.1 mg/dL — ABNORMAL LOW (ref 8.9–10.3)
Chloride: 104 mmol/L (ref 98–111)
Creatinine, Ser: 0.89 mg/dL (ref 0.44–1.00)
GFR calc Af Amer: >60 mL/min (ref >60)
GFR calc non Af Amer: >60 mL/min (ref >60)
Glucose, Bld: 99 mg/dL (ref 70–99)
Potassium: 3.1 mmol/L — ABNORMAL LOW (ref 3.5–5.1)
Sodium: 139 mmol/L (ref 135–145)

## 2018-10-17 LAB — CBC
HCT: 23.4 % — ABNORMAL LOW (ref 36.0–46.0)
Hemoglobin: 8 g/dL — ABNORMAL LOW (ref 12.0–15.0)
MCH: 35.1 pg — ABNORMAL HIGH (ref 26.0–34.0)
MCHC: 34.2 g/dL (ref 30.0–36.0)
MCV: 102.6 fL — ABNORMAL HIGH (ref 80.0–100.0)
Platelets: 116 10*3/uL — ABNORMAL LOW (ref 150–400)
RBC: 2.28 MIL/uL — ABNORMAL LOW (ref 3.87–5.11)
RDW: 15.5 % (ref 11.5–15.5)
WBC: 16 10*3/uL — ABNORMAL HIGH (ref 4.0–10.5)
nRBC: 0 % (ref 0.0–0.2)

## 2018-10-17 LAB — MAGNESIUM: Magnesium: 1.5 mg/dL — ABNORMAL LOW (ref 1.7–2.4)

## 2018-10-17 MED ORDER — POTASSIUM CHLORIDE CRYS ER 20 MEQ PO TBCR: 20.0000 meq | EXTENDED_RELEASE_TABLET | Freq: Every day | ORAL | 0 refills | Status: DC

## 2018-10-17 MED ORDER — MAGNESIUM OXIDE -MG SUPPLEMENT 400 (240 MG) MG PO TABS: 1.0000 | ORAL_TABLET | Freq: Every day | ORAL | 0 refills | Status: DC

## 2018-10-17 MED ORDER — HALOPERIDOL LACTATE 5 MG/ML IJ SOLN
5.0000 mg | Freq: Once | INTRAMUSCULAR | Status: AC
  Administered 2018-10-17: 5 mg via INTRAVENOUS
  Filled 2018-10-17: qty 1

## 2018-10-17 MED ORDER — MAGNESIUM SULFATE 2 GM/50ML IV SOLN
2.0000 g | Freq: Once | INTRAVENOUS | Status: AC
  Administered 2018-10-17: 2 g via INTRAVENOUS
  Filled 2018-10-17: qty 50

## 2018-10-17 MED ORDER — POTASSIUM CHLORIDE CRYS ER 20 MEQ PO TBCR
30.0000 meq | EXTENDED_RELEASE_TABLET | ORAL | Status: DC
  Administered 2018-10-17: 30 meq via ORAL
  Filled 2018-10-17 (×2): qty 1

## 2018-10-17 MED FILL — MAGNESIUM OXIDE 400 MG TABS: 400 | 30 days supply | Qty: 30 | Fill #0

## 2018-10-17 MED FILL — POTASSIUM CL ER 20 MEQ TABL: 20 | 30 days supply | Qty: 30 | Fill #0

## 2018-10-17 NOTE — Evaluation (Signed)
Physical Therapy Evaluation Patient Details Name: Christie Morrison MRN: [id number] DOB: [birth year] Today's Date: 10/17/2018   History of Present Illness  Pt is a 63 y/o female with a PMH significant for cirrhosis, alcohol use, and seizure-like episodes who was brought to the ED by EMS for reported seizure-like episode while waiting at a bus stop. UDS positive for cocaine.  Clinical Impression  Pt admitted with above diagnosis. Pt currently with functional limitations due to the deficits listed below (see PT Problem List). At the time of PT eval pt was able to perform transfers with mod I and ambulation with gross min guard assist to supervision for safety. SPC improved overall balance support and safety. Anticipate pt will progress well. Pt will benefit from skilled PT to increase their independence and safety with mobility to allow discharge to the venue listed below.       Follow Up Recommendations No PT follow up;Supervision for mobility/OOB    Equipment Recommendations  Cane    Recommendations for Other Services       Precautions / Restrictions Precautions Precautions: Fall Restrictions Weight Bearing Restrictions: No      Mobility  Bed Mobility Overal bed mobility: Needs Assistance Bed Mobility: Supine to Sit;Sit to Supine     Supine to sit: Modified independent (Device/Increase time) Sit to supine: Modified independent (Device/Increase time)   General bed mobility comments: Increased time  Transfers Overall transfer level: Modified independent Equipment used: None             General transfer comment: Increased time and guarded position of UE's.   Ambulation/Gait Ambulation/Gait assistance: Supervision;Min guard Gait Distance (Feet): 300 Feet Assistive device: None;Straight cane Gait Pattern/deviations: Step-through pattern;Decreased stride length;Trunk flexed Gait velocity: Decreased Gait velocity interpretation: 1.31 - 2.62 ft/sec, indicative of limited  community ambulator General Gait Details: Unsteady with frequent lateral losses of balance. Initially using no AD. SPC at end of gait training, and balance was overall improved.   Stairs            Wheelchair Mobility    Modified Rankin (Stroke Patients Only)       Balance Overall balance assessment: Needs assistance Sitting-balance support: Feet supported;No upper extremity supported Sitting balance-Leahy Scale: Fair Sitting balance - Comments: posterior lean during MMT   Standing balance support: No upper extremity supported;During functional activity Standing balance-Leahy Scale: Poor Standing balance comment: LOB for dynamic activity                             Pertinent Vitals/Pain Pain Assessment: No/denies pain    Home Living Family/patient expects to be discharged to:: Private residence Living Arrangements: Children Available Help at Discharge: Family;Friend(s);Available PRN/intermittently Type of Home: House Home Access: Level entry     Home Layout: One level Home Equipment: None      Prior Function Level of Independence: Independent               Hand Dominance   Dominant Hand: Right    Extremity/Trunk Assessment   Upper Extremity Assessment Upper Extremity Assessment: Overall WFL for tasks assessed    Lower Extremity Assessment Lower Extremity Assessment: RLE deficits/detail RLE Deficits / Details: Strength 5/5 bilaterally in quads and hamstrings. 4/5 in hip flexors.     Cervical / Trunk Assessment Cervical / Trunk Assessment: Other exceptions Cervical / Trunk Exceptions: Forward head posture with rounded shoulders  Communication   Communication: No difficulties  Cognition Arousal/Alertness: Awake/alert Behavior During  Therapy: Flat affect;Impulsive Overall Cognitive Status: No family/caregiver present to determine baseline cognitive functioning Area of Impairment: Safety/judgement                          Safety/Judgement: Decreased awareness of safety            General Comments      Exercises     Assessment/Plan    PT Assessment Patient needs continued PT services  PT Problem List Decreased activity tolerance;Decreased balance;Decreased mobility;Decreased coordination;Decreased knowledge of use of DME;Decreased safety awareness       PT Treatment Interventions DME instruction;Gait training;Functional mobility training;Therapeutic activities;Therapeutic exercise;Neuromuscular re-education;Patient/family education    PT Goals (Current goals can be found in the Care Plan section)  Acute Rehab PT Goals Patient Stated Goal: Home today PT Goal Formulation: With patient Time For Goal Achievement: 10/24/18 Potential to Achieve Goals: Good    Frequency Min 3X/week   Barriers to discharge        Co-evaluation               AM-PAC PT "6 Clicks" Mobility  Outcome Measure Help needed turning from your back to your side while in a flat bed without using bedrails?: None Help needed moving from lying on your back to sitting on the side of a flat bed without using bedrails?: None Help needed moving to and from a bed to a chair (including a wheelchair)?: A Little Help needed standing up from a chair using your arms (e.g., wheelchair or bedside chair)?: A Little Help needed to walk in hospital room?: A Little Help needed climbing 3-5 steps with a railing? : A Little 6 Click Score: 20    End of Session Equipment Utilized During Treatment: Gait belt Activity Tolerance: Patient tolerated treatment well Patient left: in bed;with call bell/phone within reach Nurse Communication: Mobility status PT Visit Diagnosis: Unsteadiness on feet (R26.81);Muscle weakness (generalized) (M62.81)    Time: 2585-2778 PT Time Calculation (min) (ACUTE ONLY): 24 min   Charges:   PT Evaluation $PT Eval Moderate Complexity: 1 Mod PT Treatments $Gait Training: 8-22 mins        Rolinda Roan, PT, DPT Acute Rehabilitation Services Pager: [phone] Office: [place] 10/17/2018, 3:14 PM

## 2018-10-17 NOTE — Discharge Summary (Signed)
Physician Discharge Summary  [name] OVZ:[id number] DOB: 11/02/1955 DOA: 10/15/2018  PCP: Gildardo Pounds, NP  Admit date: 10/15/2018 Discharge date: 10/17/2018  Admitted From: Home Disposition:  Home  Recommendations for Outpatient Follow-up:  1. Follow up with PCP in 1 week 2. Please obtain BMP and magnesium level in 1 week 3. Encourage further cessation from alcohol and cocaine abuse  Home Health: no Equipment/Devices: none  Discharge Condition stable CODE STATUS: Full code Diet recommendation: Heart Healthy   History of present illness:  Christie Morrison a 63 y.o.femalewith medical history significant forcirrhosis, alcohol use, and seizure-like episodes who was brought to the ED by EMS for reported seizure-like episode while waiting at a bus stop.Patient states that she normally takes potassium and magnesium supplements which she ran out of about 4 days ago. She says she has not been eating well since that time either. She has had intermittent lightheadedness. She was at the bus stop earlier today when she noticed having cramping in both of her legs and became diaphoretic. She does not recall what happened next, just arriving in the hospital.  Per ED documentation she had a witnessed shaking spell and was unresponsive for approximately 1-2 minutes. Per report she did not fall or have any injury. EMS were called and she was reportedly near her baseline with some confusion. She does not recall any chest pain, dyspnea, fevers, abdominal pain, nausea, or vomiting just prior to this episode. She reports regular daily bowel movements without diarrhea. She denies any dysuria. She is not taking any other medications regularly other than potassium and magnesium supplements.  She reports chronic alcohol use, drinking aboutone12 ounce beer per day with last drink same day of admission. She denies any history of alcohol withdrawal. She denies any tobacco use. She  reports a history of marijuana use and denies any cocaine or IV drug use.  Hospital course:  Syncope versus seizure-like activity versus alcohol withdrawal Patient presenting following reported syncopal versus seizure-like activity.  Has history of similar episodes in the past. Previously seen by neurology with normal EEG in June 2019.  Also likely exacerbated by her history of substance abuse with EtOH and noted positive cocaine on UDS.  Patient was monitored on telemetry without any concerning arrhythmias.  EEG was unremarkable.  Transthoracic echocardiogram with normal LVEF. Discussed with patient need for cessation of polysubstance abuse, although she denies cocaine use.  Acute on chronic stage II kidney injury Creatinine 1.78 on admission.  Etiology likely prerenal from poor oral intake.   Patient was started on IV fluid hydration with resolution of her renal failure.  Patient's creatinine at time of discharge was 0.98.   Hypokalemia Hypomagnesemia Potassium 2.8 with magnesium 1.0.    Repleted during hospitalization.  Potassium and magnesium supplements prescribed at discharge.  Leukocytosis WBC 24.7 compared to 18.8 on 09/22/2018. No obvious infectious source. Possibly reactive in setting of possible seizure activity.  WBC count trended down to 16.0 at time of discharge.  Cirrhosis of the liver LFTs stable to improving compared to recent labs. She has no abdominal pain or overt signs/symptoms of decompensated cirrhosis.   Alcohol use Cocaine abuse Patient reports drinking 1 beer daily. Son reports significant liquor use with last drink 3 days prior to admission. She is at high risk for alcohol withdrawal.  UDS was positive for cocaine.  Patient was monitored on CIWA protocol with no significant withdrawal symptoms appreciated.  Discussed with her need for complete cessation.  Discharge Diagnoses:  Active Problems:   Alcohol abuse   Cocaine abuse (HCC)   Cirrhosis,  alcoholic ([place])    Discharge Instructions  Discharge Instructions    Call MD for:  difficulty breathing, headache or visual disturbances   Complete by:  As directed    Call MD for:  difficulty breathing, headache or visual disturbances   Complete by:  As directed    Call MD for:  extreme fatigue   Complete by:  As directed    Call MD for:  extreme fatigue   Complete by:  As directed    Call MD for:  persistant dizziness or light-headedness   Complete by:  As directed    Call MD for:  persistant dizziness or light-headedness   Complete by:  As directed    Call MD for:  persistant nausea and vomiting   Complete by:  As directed    Call MD for:  persistant nausea and vomiting   Complete by:  As directed    Call MD for:  severe uncontrolled pain   Complete by:  As directed    Call MD for:  severe uncontrolled pain   Complete by:  As directed    Call MD for:  temperature >100.4   Complete by:  As directed    Call MD for:  temperature >100.4   Complete by:  As directed    Diet - low sodium heart healthy   Complete by:  As directed    Diet - low sodium heart healthy   Complete by:  As directed    Increase activity slowly   Complete by:  As directed    Increase activity slowly   Complete by:  As directed      Allergies as of 10/17/2018   No Known Allergies     Medication List    STOP taking these medications   amoxicillin-clavulanate 875-125 MG tablet Commonly known as:  Augmentin   Magnesium Oxide 400 MG Caps   magnesium oxide 400 MG tablet Commonly known as:  MAG-OX   MAGNESIUM PO   pantoprazole 40 MG tablet Commonly known as:  PROTONIX   POTASSIUM PO   thiamine 100 MG tablet Commonly known as:  VITAMIN B-1     TAKE these medications   Magnesium Oxide 400 (240 Mg) MG Tabs Take 1 tablet (400 mg total) by mouth daily.   potassium chloride SA 20 MEQ tablet Commonly known as:  K-DUR Take 1 tablet (20 mEq total) by mouth daily.      Follow-up  Information    Gildardo Pounds, NP. Call in 1 week(s).   Specialty:  Nurse Practitioner Contact information: [place] [address] [phone]          No Known Allergies  Consultations:  none   Procedures/Studies: Ct Head Wo Contrast  Result Date: 10/15/2018 CLINICAL DATA:  Near syncope today. EXAM: CT HEAD WITHOUT CONTRAST TECHNIQUE: Contiguous axial images were obtained from the base of the skull through the vertex without intravenous contrast. COMPARISON:  Brain MRI and head CT scans 09/22/2018. FINDINGS: Brain: No evidence of acute infarction, hemorrhage, hydrocephalus, extra-axial collection or mass lesion/mass effect. Mild chronic microvascular ischemic change and cortical atrophy noted. Vascular: Atherosclerosis is identified. Skull: Intact.  No focal lesion. Sinuses/Orbits: Minimal mucosal thickening left maxillary sinus noted. Other: None. IMPRESSION: No acute abnormality. Atrophy and chronic microvascular ischemic change. Atherosclerosis. Electronically Signed   By: Inge Rise M.D.   On: 10/15/2018 19:36   Mr  Brain Wo Contrast  Result Date: 09/22/2018 CLINICAL DATA:  Generalized weakness, right greater than left. History of alcohol and drug abuse, cirrhosis, and seizures. EXAM: MRI HEAD WITHOUT CONTRAST TECHNIQUE: Multiplanar, multiecho pulse sequences of the brain and surrounding structures were obtained without intravenous contrast. COMPARISON:  Head CT 09/22/2018 FINDINGS: Brain: No acute infarct, mass, midline shift, or extra-axial fluid collection is identified. A single chronic microhemorrhage is noted in the right cerebellar hemisphere. Cerebral white matter T2 hyperintensities are most notable in the periventricular regions and are nonspecific but compatible with mild chronic small vessel ischemic disease. There is mild cerebral and cerebellar atrophy. Vascular: Major intracranial vascular flow voids are preserved. Skull and upper cervical spine:  Unremarkable bone marrow signal. Sinuses/Orbits: Unremarkable orbits. Mild-to-moderate mucosal thickening in the paranasal sinuses with small volume sphenoid sinus fluid as described on earlier CT. Trace right mastoid effusion. Other: None. IMPRESSION: 1. No acute intracranial abnormality. 2. Mild chronic small vessel ischemic disease. Electronically Signed   By: Logan Bores M.D.   On: 09/22/2018 20:05   Dg Chest Portable 1 View  Result Date: 10/15/2018 CLINICAL DATA:  Elevated white count EXAM: PORTABLE CHEST 1 VIEW COMPARISON:  06/09/2016 FINDINGS: The heart size and mediastinal contours are within normal limits. Both lungs are clear. The visualized skeletal structures are unremarkable. Mild aortic atherosclerosis. IMPRESSION: No active disease. Electronically Signed   By: Donavan Foil M.D.   On: 10/15/2018 20:30   Ct Head Code Stroke Wo Contrast`  Result Date: 09/22/2018 CLINICAL DATA:  Code stroke.  Right-sided deficits. EXAM: CT HEAD WITHOUT CONTRAST TECHNIQUE: Contiguous axial images were obtained from the base of the skull through the vertex without intravenous contrast. COMPARISON:  10/23/2017 FINDINGS: Brain: There is no evidence of acute infarct, intracranial hemorrhage, mass, midline shift, or extra-axial fluid collection. Patchy cerebral white matter hypodensities are similar to the prior study and nonspecific but compatible with mild-to-moderate chronic small vessel ischemic disease. There is mild cerebral atrophy. Vascular: Calcified atherosclerosis at the skull base. No hyperdense vessel. Skull: No acute fracture or suspicious osseous lesion. Sinuses/Orbits: Old medial left orbital blowout fracture. Mild bilateral ethmoid, maxillary, and sphenoid sinus mucosal thickening. Small volume bubbly fluid in the sphenoid sinuses. Clear mastoid air cells. Other: None. ASPECTS [facility] Stroke Program Early CT Score) - Ganglionic level infarction (caudate, lentiform nuclei, internal capsule, insula,  M1-M3 cortex): 7 - Supraganglionic infarction (M4-M6 cortex): 3 Total score (0-10 with 10 being normal): 10 IMPRESSION: 1. No evidence of acute intracranial abnormality. 2. ASPECTS is 10. 3. Mild-to-moderate chronic small vessel ischemic disease. These results were communicated to Dr. Cheral Marker at 5:30 pm on 09/22/2018 by text page via the [facility] messaging system. Electronically Signed   By: Logan Bores M.D.   On: 09/22/2018 17:30    EEG ANALYSIS: A 16 channel recording using standard 10 20 measurements is conducted for 21 minutes.  There is a well-formed posterior dominant rhythm of 12 Hz which attenuates with eye opening.  There is beta activity observed in the frontal areas.  Awake and drowsy architecture are observed.  There is normal myogenic interference seen throughout the recording.  Photic stimulation is carried out without abnormal changes in the background activity.  There is no focal or lateralized slowing.  There is no epileptiform activity is observed.   IMPRESSION: 1.  This is a normal recording of awake and drowsy states.  Transthoracic echocardiogram: IMPRESSIONS    1. The left ventricle has normal systolic function with an ejection fraction of 60-65%.  The cavity size was normal. Left ventricular diastolic Doppler parameters are consistent with impaired relaxation. No evidence of left ventricular regional wall  motion abnormalities.  2. The right ventricle has normal systolic function. The cavity was normal. There is no increase in right ventricular wall thickness.  3. The aortic root is normal in size and structure.   Subjective: Patient seen and examined at bedside, resting but easily arousable.  Requests when she can discharge home.  Agitated at nursing at times due to inability to use the restroom by herself.  No other complaints at this time.  Denies headache, no fever/chills/night sweats, no chest pain, palpitations, no abdominal pain, no shortness of breath, no weakness,  no paresthesias.   Discharge Exam: Vitals:   10/17/18 0830 10/17/18 0900  BP: 121/73   Pulse: 79   Resp:  20  Temp: 98 F (36.7 C)   SpO2: 100%    Vitals:   10/16/18 1206 10/16/18 2045 10/17/18 0830 10/17/18 0900  BP: (!) 163/103 (!) 145/98 121/73   Pulse: 98 98 79   Resp: 18 20  20   Temp: 98.8 F (37.1 C) 99.7 F (37.6 C) 98 F (36.7 C)   TempSrc: Oral Oral Axillary   SpO2: 100% 100% 100%   Weight:      Height:        General: Pt is alert, awake, not in acute distress Cardiovascular: RRR, S1/S2 +, no rubs, no gallops Respiratory: CTA bilaterally, no wheezing, no rhonchi Abdominal: Soft, NT, ND, bowel sounds + Extremities: no edema, no cyanosis    The results of significant diagnostics from this hospitalization (including imaging, microbiology, ancillary and laboratory) are listed below for reference.     Microbiology: Recent Results (from the past 240 hour(s))  SARS Coronavirus 2 (CEPHEID - Performed in [place] hospital lab), Hosp Order     Status: None   Collection Time: 10/15/18  7:40 PM  Result Value Ref Range Status   SARS Coronavirus 2 NEGATIVE NEGATIVE Final    Comment: (NOTE) If result is NEGATIVE SARS-CoV-2 target nucleic acids are NOT DETECTED. The SARS-CoV-2 RNA is generally detectable in upper and lower  respiratory specimens during the acute phase of infection. The lowest  concentration of SARS-CoV-2 viral copies this assay can detect is 250  copies / mL. A negative result does not preclude SARS-CoV-2 infection  and should not be used as the sole basis for treatment or other  patient management decisions.  A negative result may occur with  improper specimen collection / handling, submission of specimen other  than nasopharyngeal swab, presence of viral mutation(s) within the  areas targeted by this assay, and inadequate number of viral copies  (<250 copies / mL). A negative result must be combined with clinical  observations, patient history,  and epidemiological information. If result is POSITIVE SARS-CoV-2 target nucleic acids are DETECTED. The SARS-CoV-2 RNA is generally detectable in upper and lower  respiratory specimens dur ing the acute phase of infection.  Positive  results are indicative of active infection with SARS-CoV-2.  Clinical  correlation with patient history and other diagnostic information is  necessary to determine patient infection status.  Positive results do  not rule out bacterial infection or co-infection with other viruses. If result is PRESUMPTIVE POSTIVE SARS-CoV-2 nucleic acids MAY BE PRESENT.   A presumptive positive result was obtained on the submitted specimen  and confirmed on repeat testing.  While 2019 novel coronavirus  (SARS-CoV-2) nucleic acids may be present in the  submitted sample  additional confirmatory testing may be necessary for epidemiological  and / or clinical management purposes  to differentiate between  SARS-CoV-2 and other Sarbecovirus currently known to infect humans.  If clinically indicated additional testing with an alternate test  methodology [phone]) is advised. The SARS-CoV-2 RNA is generally  detectable in upper and lower respiratory sp ecimens during the acute  phase of infection. The expected result is Negative. Fact Sheet for Patients:  StrictlyIdeas.no Fact Sheet for Healthcare Providers: BankingDealers.co.za This test is not yet approved or cleared by the [place] FDA and has been authorized for detection and/or diagnosis of SARS-CoV-2 by FDA under an Emergency Use Authorization (EUA).  This EUA will remain in effect (meaning this test can be used) for the duration of the COVID-19 declaration under Section 564(b)(1) of the Act, 21 U.S.C. section 360bbb-3(b)(1), unless the authorization is terminated or revoked sooner. Performed at [facility], [place] [address], [place], [place] [address]       Labs: BNP (last 3 results) No results for input(s): BNP in the last 8760 hours. Basic Metabolic Panel: Recent Labs  Lab 10/15/18 1802 10/15/18 2255 10/16/18 0348 10/17/18 0500  NA 135  --  136 139  K 3.1*  --  2.8* 3.1*  CL 91*  --  96* 104  CO2 12*  --  25 24  GLUCOSE 102*  --  108* 99  BUN 10  --  10 9  CREATININE 1.78*  --  1.44* 0.89  CALCIUM 6.7*  --  6.2* 7.1*  MG  --  1.0*  --  1.5*   Liver Function Tests: Recent Labs  Lab 10/15/18 1802 10/16/18 0348  AST 93* 60*  ALT 23 18  ALKPHOS 178* 161*  BILITOT 1.6* 1.3*  PROT 7.7 6.6  ALBUMIN 3.7 3.3*   No results for input(s): LIPASE, AMYLASE in the last 168 hours. No results for input(s): AMMONIA in the last 168 hours. CBC: Recent Labs  Lab 10/15/18 1802 10/16/18 0348 10/17/18 0500  WBC 24.7* 23.8* 16.0*  NEUTROABS 21.6*  --   --   HGB 10.6* 8.8* 8.0*  HCT 32.7* 25.5* 23.4*  MCV 107.2* 100.4* 102.6*  PLT 147* 118* 116*   Cardiac Enzymes: No results for input(s): CKTOTAL, CKMB, CKMBINDEX, TROPONINI in the last 168 hours. BNP: Invalid input(s): POCBNP CBG: Recent Labs  Lab 10/15/18 1801 10/16/18 0650  GLUCAP 98 111*   D-Dimer No results for input(s): DDIMER in the last 72 hours. Hgb A1c No results for input(s): HGBA1C in the last 72 hours. Lipid Profile No results for input(s): CHOL, HDL, LDLCALC, TRIG, CHOLHDL, LDLDIRECT in the last 72 hours. Thyroid function studies No results for input(s): TSH, T4TOTAL, T3FREE, THYROIDAB in the last 72 hours.  Invalid input(s): FREET3 Anemia work up No results for input(s): VITAMINB12, FOLATE, FERRITIN, TIBC, IRON, RETICCTPCT in the last 72 hours. Urinalysis    Component Value Date/Time   COLORURINE AMBER (A) 10/15/2018 2050   APPEARANCEUR CLOUDY (A) 10/15/2018 2050   LABSPEC 1.021 10/15/2018 2050   PHURINE 5.0 10/15/2018 2050   GLUCOSEU 50 (A) 10/15/2018 2050   HGBUR SMALL (A) 10/15/2018 2050   BILIRUBINUR SMALL (A) 10/15/2018 2050   KETONESUR 20  (A) 10/15/2018 2050   PROTEINUR 100 (A) 10/15/2018 2050   NITRITE NEGATIVE 10/15/2018 2050   LEUKOCYTESUR NEGATIVE 10/15/2018 2050   Sepsis Labs Invalid input(s): PROCALCITONIN,  WBC,  LACTICIDVEN Microbiology Recent Results (from the past 240 hour(s))  SARS Coronavirus 2 (  CEPHEID - Performed in [place] hospital lab), Hosp Order     Status: None   Collection Time: 10/15/18  7:40 PM  Result Value Ref Range Status   SARS Coronavirus 2 NEGATIVE NEGATIVE Final    Comment: (NOTE) If result is NEGATIVE SARS-CoV-2 target nucleic acids are NOT DETECTED. The SARS-CoV-2 RNA is generally detectable in upper and lower  respiratory specimens during the acute phase of infection. The lowest  concentration of SARS-CoV-2 viral copies this assay can detect is 250  copies / mL. A negative result does not preclude SARS-CoV-2 infection  and should not be used as the sole basis for treatment or other  patient management decisions.  A negative result may occur with  improper specimen collection / handling, submission of specimen other  than nasopharyngeal swab, presence of viral mutation(s) within the  areas targeted by this assay, and inadequate number of viral copies  (<250 copies / mL). A negative result must be combined with clinical  observations, patient history, and epidemiological information. If result is POSITIVE SARS-CoV-2 target nucleic acids are DETECTED. The SARS-CoV-2 RNA is generally detectable in upper and lower  respiratory specimens dur ing the acute phase of infection.  Positive  results are indicative of active infection with SARS-CoV-2.  Clinical  correlation with patient history and other diagnostic information is  necessary to determine patient infection status.  Positive results do  not rule out bacterial infection or co-infection with other viruses. If result is PRESUMPTIVE POSTIVE SARS-CoV-2 nucleic acids MAY BE PRESENT.   A presumptive positive result was obtained on  the submitted specimen  and confirmed on repeat testing.  While 2019 novel coronavirus  (SARS-CoV-2) nucleic acids may be present in the submitted sample  additional confirmatory testing may be necessary for epidemiological  and / or clinical management purposes  to differentiate between  SARS-CoV-2 and other Sarbecovirus currently known to infect humans.  If clinically indicated additional testing with an alternate test  methodology [phone]) is advised. The SARS-CoV-2 RNA is generally  detectable in upper and lower respiratory sp ecimens during the acute  phase of infection. The expected result is Negative. Fact Sheet for Patients:  StrictlyIdeas.no Fact Sheet for Healthcare Providers: BankingDealers.co.za This test is not yet approved or cleared by the [place] FDA and has been authorized for detection and/or diagnosis of SARS-CoV-2 by FDA under an Emergency Use Authorization (EUA).  This EUA will remain in effect (meaning this test can be used) for the duration of the COVID-19 declaration under Section 564(b)(1) of the Act, 21 U.S.C. section 360bbb-3(b)(1), unless the authorization is terminated or revoked sooner. Performed at [facility], [place] [address], [place], [place] [address]      Time coordinating discharge: Over 30 minutes  SIGNED:   Eric J [place] DO  [facility] 10/17/2018, 11:23 AM

## 2018-10-17 NOTE — Progress Notes (Signed)
Attending MD called with updates. D/'c'd TELE sitter at this time. Will continue to monitor.  Ave Filter, RN

## 2018-10-17 NOTE — Progress Notes (Signed)
NURSING PROGRESS NOTE  Christie Morrison [id number] Discharge Data: 10/17/2018 4:00 PM Attending Provider: No att. providers found DBZ:[id number], Vernia Buff, NP     Lethea Killings to be D/C'd Home per MD order.  Discussed with the patient the After Visit Summary and all questions fully answered. All IV's discontinued with no bleeding noted. All belongings returned to patient for patient to take home.   Last Vital Signs:  Blood pressure (!) 169/111, pulse 85, temperature 98.9 F (37.2 C), temperature source Oral, resp. rate 20, height 5\' 10"  (1.778 m), weight 60.9 kg, SpO2 100 %.  Discharge Medication List Allergies as of 10/17/2018   No Known Allergies     Medication List    STOP taking these medications   amoxicillin-clavulanate 875-125 MG tablet Commonly known as:  Augmentin   Magnesium Oxide 400 MG Caps   magnesium oxide 400 MG tablet Commonly known as:  MAG-OX   MAGNESIUM PO   pantoprazole 40 MG tablet Commonly known as:  PROTONIX   POTASSIUM PO   thiamine 100 MG tablet Commonly known as:  VITAMIN B-1     TAKE these medications   Magnesium Oxide 400 (240 Mg) MG Tabs Take 1 tablet (400 mg total) by mouth daily.   potassium chloride SA 20 MEQ tablet Commonly known as:  K-DUR Take 1 tablet (20 mEq total) by mouth daily.

## 2018-10-17 NOTE — Discharge Instructions (Signed)
Seizure, Adult °When you have a seizure: °· Parts of your body may move. °· You may have a change in how aware or awake (conscious) you are. °· You may shake (convulse). °Seizures usually last from 30 seconds to 2 minutes. Usually, they are not harmful unless they last a long time. °What are the signs or symptoms? °Common symptoms of this condition include: °· Shaking (convulsions). °· Stiffness in the body. °· Passing out (losing consciousness). °· Uncontrolled movements in the: °? Arms or legs. °? Eyes. °? Head. °? Mouth. °Some people have symptoms right before a seizure happens. These symptoms may include: °· Fear. °· Worry (anxiety). °· Feeling like you are going to throw up (nausea). °· Feeling like the room is spinning (vertigo). °· Feeling like you saw or heard something before (déjà vu). °· Odd tastes or smells. °· Changes in vision, such as seeing flashing lights or spots. °Follow these instructions at home: °Medicines ° °· Take over-the-counter and prescription medicines only as told by your doctor. °· Do not eat or drink anything that may keep your medicine from working, such as alcohol. °Activity °· Do not do any activities that would be dangerous if you had another seizure, like driving or swimming. Wait until your doctor says it is safe for you to do them. °· If you live in the [place] ask your local DMV (department of motor vehicles) when you can drive. °· Get plenty of rest. °Teaching others ° °· Teach friends and family what to do when you have a seizure. They should: °? Lay you on the ground. °? Protect your head and body. °? Loosen any tight clothing around your neck. °? Turn you on your side. °? Not hold you down. °? Not put anything into your mouth. °? Know whether or not you need emergency care. °? Stay with you until you are better. °General instructions °· Contact your doctor each time you have a seizure. °· Avoid anything that gives you seizures. °· Keep a seizure diary. Write down: °? What  you think caused each seizure. °? What you remember about each seizure. °· Keep all follow-up visits as told by your doctor. This is important. °Contact a doctor if: °· You have another seizure. °· You have seizures more often. °· There is any change in what happens during your seizures. °· You keep having seizures with treatment. °· You have symptoms of being sick or having an infection. °Get help right away if: °· You have a seizure: °? That lasts longer than 5 minutes. °? That is different than seizures you had before. °? That makes it harder to breathe. °? After you hurt your head. °· After a seizure, you cannot speak or use a part of your body. °· After a seizure, you are confused or have a bad headache. °· You have two or more seizures in a row. °· You are having seizures more often. °· You do not wake up right after a seizure. °· You get hurt during a seizure. °In an emergency: °· These symptoms may be an emergency. Do not wait to see if the symptoms will go away. Get medical help right away. Call your local emergency services (911 in the [place] Do not drive yourself to the hospital. °Summary °· Seizures usually last from 30 seconds to 2 minutes. Usually, they are not harmful unless they last a long time. °· Do not eat or drink anything that may keep your medicine from working, such as alcohol. °·   Teach friends and family what to do when you have a seizure. °· Contact your doctor each time you have a seizure. °This information is not intended to replace advice given to you by your health care provider. Make sure you discuss any questions you have with your health care provider. °Document Released: 10/24/2007 Document Revised: 01/29/2018 Document Reviewed: 06/13/2017 °Elsevier Interactive Patient Education © 2019 [employer] ° °

## 2018-11-20 ENCOUNTER — Emergency Department (HOSPITAL_COMMUNITY): Payer: Self-pay

## 2018-11-20 ENCOUNTER — Other Ambulatory Visit: Payer: Self-pay

## 2018-11-20 ENCOUNTER — Emergency Department (HOSPITAL_COMMUNITY)
Admission: EM | Admit: 2018-11-20 | Discharge: 2018-11-21 | Disposition: A | Discharge status: Home / Self Care | Payer: Self-pay | Attending: Emergency Medicine | Admitting: Emergency Medicine

## 2018-11-20 DIAGNOSIS — E860 Dehydration: Secondary | ICD-10-CM | POA: Insufficient documentation

## 2018-11-20 DIAGNOSIS — R55 Syncope and collapse: Secondary | ICD-10-CM | POA: Insufficient documentation

## 2018-11-20 DIAGNOSIS — R569 Unspecified convulsions: Secondary | ICD-10-CM | POA: Insufficient documentation

## 2018-11-20 DIAGNOSIS — N390 Urinary tract infection, site not specified: Secondary | ICD-10-CM | POA: Insufficient documentation

## 2018-11-20 LAB — CBC
HCT: 35.7 % — ABNORMAL LOW (ref 36.0–46.0)
Hemoglobin: 11.6 g/dL — ABNORMAL LOW (ref 12.0–15.0)
MCH: 34.4 pg — ABNORMAL HIGH (ref 26.0–34.0)
MCHC: 32.5 g/dL (ref 30.0–36.0)
MCV: 105.9 fL — ABNORMAL HIGH (ref 80.0–100.0)
Platelets: 187 10*3/uL (ref 150–400)
RBC: 3.37 MIL/uL — ABNORMAL LOW (ref 3.87–5.11)
RDW: 14.4 % (ref 11.5–15.5)
WBC: 16.5 10*3/uL — ABNORMAL HIGH (ref 4.0–10.5)
nRBC: 0 % (ref 0.0–0.2)

## 2018-11-20 LAB — BASIC METABOLIC PANEL
Anion gap: 18 — ABNORMAL HIGH (ref 5–15)
BUN: 14 mg/dL (ref 8–23)
CO2: 18 mmol/L — ABNORMAL LOW (ref 22–32)
Calcium: 9.4 mg/dL (ref 8.9–10.3)
Chloride: 103 mmol/L (ref 98–111)
Creatinine, Ser: 1.45 mg/dL — ABNORMAL HIGH (ref 0.44–1.00)
GFR calc Af Amer: 45 mL/min — ABNORMAL LOW (ref >60)
GFR calc non Af Amer: 38 mL/min — ABNORMAL LOW (ref >60)
Glucose, Bld: 86 mg/dL (ref 70–99)
Potassium: 4.3 mmol/L (ref 3.5–5.1)
Sodium: 139 mmol/L (ref 135–145)

## 2018-11-20 MED ORDER — SODIUM CHLORIDE 0.9 % IV BOLUS
1000.0000 mL | Freq: Once | INTRAVENOUS | Status: AC
  Administered 2018-11-20: 1000 mL via INTRAVENOUS

## 2018-11-20 MED ORDER — SODIUM CHLORIDE 0.9% FLUSH: 3.0000 mL | Freq: Once | INTRAVENOUS | Status: DC

## 2018-11-20 NOTE — ED Triage Notes (Signed)
Per EMS- pt has a syncopal episode at work. Pt fell and hit head. Pt was founf to be hypotensive at 70 systolic initially. Pt alert to voice and oriented in triage. Pt has hx of low potassium and has not been taking her medications.

## 2018-11-21 LAB — URINALYSIS, ROUTINE W REFLEX MICROSCOPIC
Bilirubin Urine: NEGATIVE
Glucose, UA: NEGATIVE mg/dL
Ketones, ur: 5 mg/dL — AB
Nitrite: POSITIVE — AB
Protein, ur: 30 mg/dL — AB
Specific Gravity, Urine: 1.017 (ref 1.005–1.030)
WBC, UA: >50 WBC/hpf — ABNORMAL HIGH (ref 0–5)
pH: 5 (ref 5.0–8.0)

## 2018-11-21 NOTE — ED Provider Notes (Signed)
Patient seen/examined in the Emergency Department in conjunction with Advanced Practice Provider [place] Patient reports syncope, she hit her head in the process Exam : awake/alert, no distress, watching TV, maex4 Plan: workup pending, appears dehydrated but otherwise stable     Ripley Fraise, MD 11/21/18 0016

## 2018-11-21 NOTE — ED Notes (Signed)
Patient verbalizes understanding of discharge instructions. Opportunity for questioning and answers were provided. Armband removed by staff, pt discharged from ED home via public transportation.

## 2018-11-21 NOTE — Discharge Instructions (Addendum)
Return here as needed. Follow up with your doctor. YOu have a UTI that may have contributed.

## 2018-11-21 NOTE — ED Notes (Signed)
Pt had to be vigorously stimulated to awaken her for discharge.

## 2018-12-09 NOTE — ED Provider Notes (Signed)
[facility] EMERGENCY DEPARTMENT Provider Note   CSN: [id number] Arrival date & time: 11/20/18  1619     History   Chief Complaint Chief Complaint  Patient presents with  . Loss of Consciousness    HPI Christie Morrison is a 63 y.o. female.     HPI Patient presents to the emergency department following a syncopal episode.  The patient states that she did not eat or drink very well prior to this episode of syncope.  She states that she was at work when this occurred.  She was initially found to have a blood pressure of 70 and was given IV fluids.  Patient is feeling better at this time.  The patient denies chest pain, shortness of breath, headache,blurred vision, neck pain, fever, cough, weakness, numbness, anorexia, edema, abdominal pain, nausea, vomiting, diarrhea, rash, back pain, dysuria, hematemesis, bloody stool. Past Medical History:  Diagnosis Date  . Acute hepatitis   . Cirrhosis ([place])   . Crack cocaine use   . Duodenitis   . ETOH abuse   . High cholesterol   . Mallory-Weiss tear   . Seizures [facility])     Patient Active Problem List   Diagnosis Date Noted  . Cirrhosis, alcoholic ([place])   . Acute hepatitis   . Dehydration, severe 06/08/2016  . Elevated lactic acid level 06/08/2016  . Alcohol abuse 06/08/2016  . Melena 06/08/2016  . Cocaine abuse ([place]) 06/08/2016  . Thrombocytopenia ([place]) 06/08/2016  . Acute hypernatremia 06/08/2016  . Acute alcohol intoxication ([place]) 06/08/2016    Past Surgical History:  Procedure Laterality Date  . CESAREAN SECTION       OB History   No obstetric history on file.      Home Medications    Prior to Admission medications   Medication Sig Start Date End Date Taking? Authorizing Provider  Magnesium Oxide 400 (240 Mg) MG TABS Take 1 tablet (400 mg total) by mouth daily. 10/17/18   [place] Donnamarie Poag, DO  potassium chloride SA (K-DUR) 20 MEQ tablet Take 1 tablet (20 mEq total) by mouth daily. 10/17/18   [place]  Eric J, DO    Family History Family History  Problem Relation Age of Onset  . Diabetes Mellitus II Mother   . Kidney failure Mother   . Alcohol abuse Mother   . Diabetes Mellitus II Brother   . Kidney failure Brother   . Alcohol abuse Maternal Grandmother     Social History Social History   Tobacco Use  . Smoking status: Never Smoker  . Smokeless tobacco: Never Used  Substance Use Topics  . Alcohol use: Yes    Comment: occasionally   . Drug use: Not Currently    Frequency: 1.0 times per week    Types: Cocaine     Allergies   Patient has no known allergies.   Review of Systems Review of Systems All other systems negative except as documented in the HPI. All pertinent positives and negatives as reviewed in the HPI.  Physical Exam Updated Vital Signs BP 129/85   Pulse 82   Temp 98.1 F (36.7 C) (Oral)   Resp 16   SpO2 98%   Physical Exam Vitals signs and nursing note reviewed.  Constitutional:      General: She is not in acute distress.    Appearance: She is well-developed.  HENT:     Head: Normocephalic and atraumatic.  Eyes:     Pupils: Pupils are equal, round, and reactive to  light.  Neck:     Musculoskeletal: Normal range of motion and neck supple.  Cardiovascular:     Rate and Rhythm: Normal rate and regular rhythm.     Heart sounds: Normal heart sounds. No murmur. No friction rub. No gallop.   Pulmonary:     Effort: Pulmonary effort is normal. No respiratory distress.     Breath sounds: Normal breath sounds. No wheezing.  Abdominal:     General: Bowel sounds are normal. There is no distension.     Palpations: Abdomen is soft.     Tenderness: There is no abdominal tenderness.  Skin:    General: Skin is warm and dry.     Capillary Refill: Capillary refill takes less than 2 seconds.     Findings: No erythema or rash.  Neurological:     Mental Status: She is alert and oriented to person, place, and time.     Motor: No abnormal muscle tone.      Coordination: Coordination normal.  Psychiatric:        Behavior: Behavior normal.      ED Treatments / Results  Labs (all labs ordered are listed, but only abnormal results are displayed) Labs Reviewed  BASIC METABOLIC PANEL - Abnormal; Notable for the following components:      Result Value   CO2 18 (*)    Creatinine, Ser 1.45 (*)    GFR calc non Af Amer 38 (*)    GFR calc Af Amer 45 (*)    Anion gap 18 (*)    All other components within normal limits  CBC - Abnormal; Notable for the following components:   WBC 16.5 (*)    RBC 3.37 (*)    Hemoglobin 11.6 (*)    HCT 35.7 (*)    MCV 105.9 (*)    MCH 34.4 (*)    All other components within normal limits  URINALYSIS, ROUTINE W REFLEX MICROSCOPIC - Abnormal; Notable for the following components:   Color, Urine AMBER (*)    APPearance CLOUDY (*)    Hgb urine dipstick SMALL (*)    Ketones, ur 5 (*)    Protein, ur 30 (*)    Nitrite POSITIVE (*)    Leukocytes,Ua LARGE (*)    WBC, UA >50 (*)    Bacteria, UA MANY (*)    Non Squamous Epithelial 0-5 (*)    All other components within normal limits    EKG EKG Interpretation  Date/Time:  Thursday November 20 2018 19:10:57 EDT Ventricular Rate:  103 PR Interval:  116 QRS Duration: 72 QT Interval:  380 QTC Calculation: 497 R Axis:   0 Text Interpretation:  Sinus tachycardia Possible Left atrial enlargement Borderline ECG No significant change since last tracing Confirmed by Ripley Fraise [phone]) on 11/20/2018 11:14:15 PM   Radiology No results found.  Procedures Procedures (including critical care time)  Medications Ordered in ED Medications  sodium chloride 0.9 % bolus 1,000 mL (0 mLs Intravenous Stopped 11/21/18 0056)     Initial Impression / Assessment and Plan / ED Course  I have reviewed the triage vital signs and the nursing notes.  Pertinent labs & imaging results that were available during my care of the patient were reviewed by me and considered in my medical  decision making (see chart for details).        Patient is improved following IV fluids and her vital signs have normalized.  Patient feels better at this time.  She is able to  eat and drink before being discharged.  Patient ambulated without difficulty.  Final Clinical Impressions(s) / ED Diagnoses   Final diagnoses:  Syncope and collapse  Dehydration  Lower urinary tract infectious disease    ED Discharge Orders    None       Rebeca Allegra 12/09/18 2348    Ripley Fraise, MD 12/10/18 [phone]

## 2018-12-22 ENCOUNTER — Other Ambulatory Visit: Payer: Self-pay

## 2018-12-22 ENCOUNTER — Ambulatory Visit: Payer: Self-pay | Attending: Nurse Practitioner | Admitting: Nurse Practitioner

## 2019-04-16 ENCOUNTER — Observation Stay (HOSPITAL_COMMUNITY): Payer: Self-pay

## 2019-04-16 ENCOUNTER — Emergency Department (HOSPITAL_COMMUNITY): Payer: Self-pay

## 2019-04-16 ENCOUNTER — Encounter (HOSPITAL_COMMUNITY): Payer: Self-pay | Admitting: *Deleted

## 2019-04-16 ENCOUNTER — Inpatient Hospital Stay (HOSPITAL_COMMUNITY)
Admission: EM | Admit: 2019-04-16 | Discharge: 2019-05-01 | DRG: 871 | Disposition: A | Discharge status: Home Health | Payer: Self-pay | Attending: Internal Medicine | Admitting: Internal Medicine

## 2019-04-16 ENCOUNTER — Other Ambulatory Visit: Payer: Self-pay

## 2019-04-16 DIAGNOSIS — J150 Pneumonia due to Klebsiella pneumoniae: Secondary | ICD-10-CM | POA: Diagnosis present

## 2019-04-16 DIAGNOSIS — A4159 Other Gram-negative sepsis: Principal | ICD-10-CM | POA: Diagnosis present

## 2019-04-16 DIAGNOSIS — K828 Other specified diseases of gallbladder: Secondary | ICD-10-CM | POA: Diagnosis present

## 2019-04-16 DIAGNOSIS — E876 Hypokalemia: Secondary | ICD-10-CM | POA: Diagnosis present

## 2019-04-16 DIAGNOSIS — E7800 Pure hypercholesterolemia, unspecified: Secondary | ICD-10-CM | POA: Diagnosis present

## 2019-04-16 DIAGNOSIS — R05 Cough: Secondary | ICD-10-CM

## 2019-04-16 DIAGNOSIS — R9431 Abnormal electrocardiogram [ECG] [EKG]: Secondary | ICD-10-CM | POA: Diagnosis present

## 2019-04-16 DIAGNOSIS — Z8249 Family history of ischemic heart disease and other diseases of the circulatory system: Secondary | ICD-10-CM

## 2019-04-16 DIAGNOSIS — K7460 Unspecified cirrhosis of liver: Secondary | ICD-10-CM

## 2019-04-16 DIAGNOSIS — Z23 Encounter for immunization: Secondary | ICD-10-CM

## 2019-04-16 DIAGNOSIS — F10239 Alcohol dependence with withdrawal, unspecified: Secondary | ICD-10-CM | POA: Diagnosis present

## 2019-04-16 DIAGNOSIS — E860 Dehydration: Secondary | ICD-10-CM | POA: Diagnosis not present

## 2019-04-16 DIAGNOSIS — Z79899 Other long term (current) drug therapy: Secondary | ICD-10-CM

## 2019-04-16 DIAGNOSIS — R5381 Other malaise: Secondary | ICD-10-CM | POA: Diagnosis present

## 2019-04-16 DIAGNOSIS — A498 Other bacterial infections of unspecified site: Secondary | ICD-10-CM

## 2019-04-16 DIAGNOSIS — R140 Abdominal distension (gaseous): Secondary | ICD-10-CM

## 2019-04-16 DIAGNOSIS — J181 Lobar pneumonia, unspecified organism: Secondary | ICD-10-CM

## 2019-04-16 DIAGNOSIS — F10939 Alcohol use, unspecified with withdrawal, unspecified: Secondary | ICD-10-CM

## 2019-04-16 DIAGNOSIS — K2980 Duodenitis without bleeding: Secondary | ICD-10-CM | POA: Diagnosis present

## 2019-04-16 DIAGNOSIS — N183 Chronic kidney disease, stage 3 unspecified: Secondary | ICD-10-CM | POA: Diagnosis present

## 2019-04-16 DIAGNOSIS — D735 Infarction of spleen: Secondary | ICD-10-CM

## 2019-04-16 DIAGNOSIS — D539 Nutritional anemia, unspecified: Secondary | ICD-10-CM | POA: Diagnosis present

## 2019-04-16 DIAGNOSIS — Z8719 Personal history of other diseases of the digestive system: Secondary | ICD-10-CM

## 2019-04-16 DIAGNOSIS — Z811 Family history of alcohol abuse and dependence: Secondary | ICD-10-CM

## 2019-04-16 DIAGNOSIS — E872 Acidosis: Secondary | ICD-10-CM | POA: Diagnosis not present

## 2019-04-16 DIAGNOSIS — Z833 Family history of diabetes mellitus: Secondary | ICD-10-CM

## 2019-04-16 DIAGNOSIS — D72829 Elevated white blood cell count, unspecified: Secondary | ICD-10-CM

## 2019-04-16 DIAGNOSIS — E871 Hypo-osmolality and hyponatremia: Secondary | ICD-10-CM | POA: Diagnosis present

## 2019-04-16 DIAGNOSIS — K7031 Alcoholic cirrhosis of liver with ascites: Secondary | ICD-10-CM | POA: Diagnosis present

## 2019-04-16 DIAGNOSIS — J9811 Atelectasis: Secondary | ICD-10-CM | POA: Diagnosis not present

## 2019-04-16 DIAGNOSIS — K766 Portal hypertension: Secondary | ICD-10-CM | POA: Diagnosis present

## 2019-04-16 DIAGNOSIS — Z1611 Resistance to penicillins: Secondary | ICD-10-CM | POA: Diagnosis present

## 2019-04-16 DIAGNOSIS — R197 Diarrhea, unspecified: Secondary | ICD-10-CM | POA: Diagnosis present

## 2019-04-16 DIAGNOSIS — R4701 Aphasia: Secondary | ICD-10-CM | POA: Diagnosis present

## 2019-04-16 DIAGNOSIS — K7030 Alcoholic cirrhosis of liver without ascites: Secondary | ICD-10-CM | POA: Diagnosis present

## 2019-04-16 DIAGNOSIS — I129 Hypertensive chronic kidney disease with stage 1 through stage 4 chronic kidney disease, or unspecified chronic kidney disease: Secondary | ICD-10-CM | POA: Diagnosis present

## 2019-04-16 DIAGNOSIS — R000 Tachycardia, unspecified: Secondary | ICD-10-CM | POA: Diagnosis present

## 2019-04-16 DIAGNOSIS — Z841 Family history of disorders of kidney and ureter: Secondary | ICD-10-CM

## 2019-04-16 DIAGNOSIS — F1410 Cocaine abuse, uncomplicated: Secondary | ICD-10-CM | POA: Diagnosis present

## 2019-04-16 DIAGNOSIS — A599 Trichomoniasis, unspecified: Secondary | ICD-10-CM | POA: Diagnosis present

## 2019-04-16 DIAGNOSIS — R569 Unspecified convulsions: Secondary | ICD-10-CM

## 2019-04-16 DIAGNOSIS — R1011 Right upper quadrant pain: Secondary | ICD-10-CM | POA: Diagnosis present

## 2019-04-16 DIAGNOSIS — Z20828 Contact with and (suspected) exposure to other viral communicable diseases: Secondary | ICD-10-CM | POA: Diagnosis present

## 2019-04-16 DIAGNOSIS — A419 Sepsis, unspecified organism: Secondary | ICD-10-CM

## 2019-04-16 DIAGNOSIS — R269 Unspecified abnormalities of gait and mobility: Secondary | ICD-10-CM | POA: Diagnosis present

## 2019-04-16 DIAGNOSIS — C946 Myelodysplastic disease, not classified: Secondary | ICD-10-CM | POA: Diagnosis present

## 2019-04-16 DIAGNOSIS — F10929 Alcohol use, unspecified with intoxication, unspecified: Secondary | ICD-10-CM | POA: Diagnosis present

## 2019-04-16 DIAGNOSIS — E870 Hyperosmolality and hypernatremia: Secondary | ICD-10-CM | POA: Diagnosis not present

## 2019-04-16 DIAGNOSIS — R059 Cough, unspecified: Secondary | ICD-10-CM

## 2019-04-16 DIAGNOSIS — N179 Acute kidney failure, unspecified: Secondary | ICD-10-CM | POA: Diagnosis present

## 2019-04-16 DIAGNOSIS — F10229 Alcohol dependence with intoxication, unspecified: Secondary | ICD-10-CM | POA: Diagnosis present

## 2019-04-16 DIAGNOSIS — B359 Dermatophytosis, unspecified: Secondary | ICD-10-CM | POA: Diagnosis not present

## 2019-04-16 DIAGNOSIS — N390 Urinary tract infection, site not specified: Secondary | ICD-10-CM | POA: Diagnosis present

## 2019-04-16 HISTORY — DX: Essential (primary) hypertension: I10

## 2019-04-16 LAB — CBC
HCT: 26.9 % — ABNORMAL LOW (ref 36.0–46.0)
Hemoglobin: 9.3 g/dL — ABNORMAL LOW (ref 12.0–15.0)
MCH: 35 pg — ABNORMAL HIGH (ref 26.0–34.0)
MCHC: 34.6 g/dL (ref 30.0–36.0)
MCV: 101.1 fL — ABNORMAL HIGH (ref 80.0–100.0)
Platelets: 165 10*3/uL (ref 150–400)
RBC: 2.66 MIL/uL — ABNORMAL LOW (ref 3.87–5.11)
RDW: 15.6 % — ABNORMAL HIGH (ref 11.5–15.5)
WBC: 27.9 10*3/uL — ABNORMAL HIGH (ref 4.0–10.5)
nRBC: 0.1 % (ref 0.0–0.2)

## 2019-04-16 LAB — COMPREHENSIVE METABOLIC PANEL
ALT: 19 U/L (ref 0–44)
AST: 76 U/L — ABNORMAL HIGH (ref 15–41)
Albumin: 3.9 g/dL (ref 3.5–5.0)
Alkaline Phosphatase: 148 U/L — ABNORMAL HIGH (ref 38–126)
Anion gap: 25 — ABNORMAL HIGH (ref 5–15)
BUN: 9 mg/dL (ref 8–23)
CO2: 22 mmol/L (ref 22–32)
Calcium: 7.6 mg/dL — ABNORMAL LOW (ref 8.9–10.3)
Chloride: 86 mmol/L — ABNORMAL LOW (ref 98–111)
Creatinine, Ser: 1.62 mg/dL — ABNORMAL HIGH (ref 0.44–1.00)
GFR calc Af Amer: 39 mL/min — ABNORMAL LOW (ref >60)
GFR calc non Af Amer: 34 mL/min — ABNORMAL LOW (ref >60)
Glucose, Bld: 143 mg/dL — ABNORMAL HIGH (ref 70–99)
Potassium: 2.9 mmol/L — ABNORMAL LOW (ref 3.5–5.1)
Sodium: 133 mmol/L — ABNORMAL LOW (ref 135–145)
Total Bilirubin: 1.2 mg/dL (ref 0.3–1.2)
Total Protein: 7.7 g/dL (ref 6.5–8.1)

## 2019-04-16 LAB — DIFFERENTIAL
Abs Immature Granulocytes: 0 10*3/uL (ref 0.00–0.07)
Basophils Absolute: 0 10*3/uL (ref 0.0–0.1)
Basophils Relative: 0 %
Eosinophils Absolute: 0.8 10*3/uL — ABNORMAL HIGH (ref 0.0–0.5)
Eosinophils Relative: 3 %
Lymphocytes Relative: 16 %
Lymphs Abs: 4.5 10*3/uL — ABNORMAL HIGH (ref 0.7–4.0)
Monocytes Absolute: 0.8 10*3/uL (ref 0.1–1.0)
Monocytes Relative: 3 %
Neutro Abs: 21.8 10*3/uL — ABNORMAL HIGH (ref 1.7–7.7)
Neutrophils Relative %: 78 %
nRBC: 0 /100 WBC

## 2019-04-16 LAB — I-STAT CHEM 8, ED
BUN: 10 mg/dL (ref 8–23)
Calcium, Ion: 0.76 mmol/L — CL (ref 1.15–1.40)
Chloride: 88 mmol/L — ABNORMAL LOW (ref 98–111)
Creatinine, Ser: 1.3 mg/dL — ABNORMAL HIGH (ref 0.44–1.00)
Glucose, Bld: 143 mg/dL — ABNORMAL HIGH (ref 70–99)
HCT: 34 % — ABNORMAL LOW (ref 36.0–46.0)
Hemoglobin: 11.6 g/dL — ABNORMAL LOW (ref 12.0–15.0)
Potassium: 3.2 mmol/L — ABNORMAL LOW (ref 3.5–5.1)
Sodium: 131 mmol/L — ABNORMAL LOW (ref 135–145)
TCO2: 26 mmol/L (ref 22–32)

## 2019-04-16 LAB — RENAL FUNCTION PANEL
Albumin: 3.4 g/dL — ABNORMAL LOW (ref 3.5–5.0)
Anion gap: 15 (ref 5–15)
BUN: 9 mg/dL (ref 8–23)
CO2: 27 mmol/L (ref 22–32)
Calcium: 8 mg/dL — ABNORMAL LOW (ref 8.9–10.3)
Chloride: 93 mmol/L — ABNORMAL LOW (ref 98–111)
Creatinine, Ser: 1.46 mg/dL — ABNORMAL HIGH (ref 0.44–1.00)
GFR calc Af Amer: 44 mL/min — ABNORMAL LOW (ref >60)
GFR calc non Af Amer: 38 mL/min — ABNORMAL LOW (ref >60)
Glucose, Bld: 96 mg/dL (ref 70–99)
Phosphorus: 1.6 mg/dL — ABNORMAL LOW (ref 2.5–4.6)
Potassium: 4.2 mmol/L (ref 3.5–5.1)
Sodium: 135 mmol/L (ref 135–145)

## 2019-04-16 LAB — VITAMIN B12: Vitamin B-12: 309 pg/mL (ref 180–914)

## 2019-04-16 LAB — MAGNESIUM
Magnesium: 0.5 mg/dL — CL (ref 1.7–2.4)
Magnesium: 1.1 mg/dL — ABNORMAL LOW (ref 1.7–2.4)

## 2019-04-16 LAB — SARS CORONAVIRUS 2 (TAT 6-24 HRS): SARS Coronavirus 2: NEGATIVE

## 2019-04-16 LAB — PROTIME-INR
INR: 1.2 (ref 0.8–1.2)
Prothrombin Time: 14.7 seconds (ref 11.4–15.2)

## 2019-04-16 LAB — ETHANOL: Alcohol, Ethyl (B): <10 mg/dL (ref <10)

## 2019-04-16 LAB — TSH: TSH: 2.706 u[IU]/mL (ref 0.350–4.500)

## 2019-04-16 LAB — APTT: aPTT: 25 seconds (ref 24–36)

## 2019-04-16 LAB — CBG MONITORING, ED: Glucose-Capillary: 146 mg/dL — ABNORMAL HIGH (ref 70–99)

## 2019-04-16 MED ORDER — CALCIUM GLUCONATE-NACL 1-0.675 GM/50ML-% IV SOLN
1.0000 g | Freq: Once | INTRAVENOUS | Status: AC
  Administered 2019-04-16: 1000 mg via INTRAVENOUS
  Filled 2019-04-16: qty 50

## 2019-04-16 MED ORDER — FOLIC ACID 1 MG PO TABS
1.0000 mg | ORAL_TABLET | Freq: Every day | ORAL | Status: DC
  Administered 2019-04-16 – 2019-05-01 (×14): 1 mg via ORAL
  Filled 2019-04-16 (×14): qty 1

## 2019-04-16 MED ORDER — POTASSIUM CHLORIDE CRYS ER 20 MEQ PO TBCR
20.0000 meq | EXTENDED_RELEASE_TABLET | Freq: Every day | ORAL | Status: DC
  Administered 2019-04-16 – 2019-05-01 (×14): 20 meq via ORAL
  Filled 2019-04-16 (×14): qty 1

## 2019-04-16 MED ORDER — POTASSIUM CHLORIDE CRYS ER 20 MEQ PO TBCR
40.0000 meq | EXTENDED_RELEASE_TABLET | Freq: Once | ORAL | Status: AC
  Administered 2019-04-16: 40 meq via ORAL
  Filled 2019-04-16: qty 2

## 2019-04-16 MED ORDER — ACETAMINOPHEN 650 MG RE SUPP: 650.0000 mg | Freq: Four times a day (QID) | RECTAL | Status: DC | PRN

## 2019-04-16 MED ORDER — ENOXAPARIN SODIUM 40 MG/0.4ML ~~LOC~~ SOLN
40.0000 mg | SUBCUTANEOUS | Status: DC
  Administered 2019-04-16 – 2019-04-30 (×14): 40 mg via SUBCUTANEOUS
  Filled 2019-04-16 (×16): qty 0.4

## 2019-04-16 MED ORDER — ADULT MULTIVITAMIN W/MINERALS CH: 1.0000 | ORAL_TABLET | Freq: Every day | ORAL | Status: DC

## 2019-04-16 MED ORDER — MAGNESIUM OXIDE 400 (241.3 MG) MG PO TABS
400.0000 mg | ORAL_TABLET | Freq: Every day | ORAL | Status: DC
  Administered 2019-04-16 – 2019-05-01 (×14): 400 mg via ORAL
  Filled 2019-04-16 (×14): qty 1

## 2019-04-16 MED ORDER — LORAZEPAM 2 MG/ML IJ SOLN
0.5000 mg | Freq: Four times a day (QID) | INTRAMUSCULAR | Status: DC | PRN
  Administered 2019-04-26: 0.5 mg via INTRAVENOUS
  Filled 2019-04-16: qty 1

## 2019-04-16 MED ORDER — CHLORDIAZEPOXIDE HCL 25 MG PO CAPS
25.0000 mg | ORAL_CAPSULE | Freq: Four times a day (QID) | ORAL | Status: AC | PRN
  Administered 2019-04-16 – 2019-04-17 (×2): 25 mg via ORAL

## 2019-04-16 MED ORDER — CHLORDIAZEPOXIDE HCL 25 MG PO CAPS
25.0000 mg | ORAL_CAPSULE | Freq: Four times a day (QID) | ORAL | Status: AC
  Administered 2019-04-16 (×2): 25 mg via ORAL
  Filled 2019-04-16 (×4): qty 1

## 2019-04-16 MED ORDER — MAGNESIUM SULFATE 2 GM/50ML IV SOLN
2.0000 g | Freq: Once | INTRAVENOUS | Status: AC
  Administered 2019-04-16: 2 g via INTRAVENOUS
  Filled 2019-04-16: qty 50

## 2019-04-16 MED ORDER — ADULT MULTIVITAMIN W/MINERALS CH
1.0000 | ORAL_TABLET | Freq: Every day | ORAL | Status: DC
  Administered 2019-04-16 – 2019-05-01 (×14): 1 via ORAL
  Filled 2019-04-16 (×14): qty 1

## 2019-04-16 MED ORDER — SODIUM CHLORIDE 0.9 % IV SOLN: 1.0000 g | Freq: Once | INTRAVENOUS | Status: DC

## 2019-04-16 MED ORDER — THIAMINE HCL 100 MG/ML IJ SOLN
100.0000 mg | Freq: Every day | INTRAMUSCULAR | Status: DC
  Administered 2019-04-26: 100 mg via INTRAVENOUS
  Filled 2019-04-16 (×3): qty 2

## 2019-04-16 MED ORDER — POTASSIUM CHLORIDE IN NACL 20-0.9 MEQ/L-% IV SOLN
INTRAVENOUS | Status: AC
  Administered 2019-04-16: 12:00:00 via INTRAVENOUS
  Filled 2019-04-16: qty 1000

## 2019-04-16 MED ORDER — CHLORDIAZEPOXIDE HCL 25 MG PO CAPS
25.0000 mg | ORAL_CAPSULE | Freq: Every day | ORAL | Status: AC
  Administered 2019-04-19: 25 mg via ORAL
  Filled 2019-04-16: qty 1

## 2019-04-16 MED ORDER — LEVETIRACETAM IN NACL 1000 MG/100ML IV SOLN
1000.0000 mg | Freq: Once | INTRAVENOUS | Status: AC
  Administered 2019-04-16: 1000 mg via INTRAVENOUS
  Filled 2019-04-16: qty 100

## 2019-04-16 MED ORDER — ACETAMINOPHEN 325 MG PO TABS
650.0000 mg | ORAL_TABLET | Freq: Four times a day (QID) | ORAL | Status: DC | PRN
  Administered 2019-04-20 – 2019-05-01 (×9): 650 mg via ORAL
  Filled 2019-04-16 (×9): qty 2

## 2019-04-16 MED ORDER — VITAMIN B-1 100 MG PO TABS
100.0000 mg | ORAL_TABLET | Freq: Every day | ORAL | Status: DC
  Administered 2019-04-16 – 2019-05-01 (×13): 100 mg via ORAL
  Filled 2019-04-16 (×14): qty 1

## 2019-04-16 MED ORDER — CHLORDIAZEPOXIDE HCL 25 MG PO CAPS
25.0000 mg | ORAL_CAPSULE | Freq: Three times a day (TID) | ORAL | Status: AC
  Administered 2019-04-17 (×3): 25 mg via ORAL
  Filled 2019-04-16 (×3): qty 1

## 2019-04-16 MED ORDER — CHLORDIAZEPOXIDE HCL 25 MG PO CAPS
50.0000 mg | ORAL_CAPSULE | Freq: Once | ORAL | Status: AC
  Administered 2019-04-16: 50 mg via ORAL
  Filled 2019-04-16: qty 2

## 2019-04-16 MED ORDER — POTASSIUM CHLORIDE 10 MEQ/100ML IV SOLN
10.0000 meq | INTRAVENOUS | Status: AC
  Administered 2019-04-16 (×4): 10 meq via INTRAVENOUS
  Filled 2019-04-16 (×3): qty 100

## 2019-04-16 MED ORDER — CHLORDIAZEPOXIDE HCL 25 MG PO CAPS
25.0000 mg | ORAL_CAPSULE | ORAL | Status: AC
  Administered 2019-04-18 (×2): 25 mg via ORAL
  Filled 2019-04-16 (×2): qty 1

## 2019-04-16 NOTE — ED Triage Notes (Signed)
Pt arrived by Code Stroke ,LNW at 1800. Per EMS, pt found lying in the floor of the bathroom, nonverbal. Has been seen for seizures in the past, reported to ems that there was no diagnosed hx of seizures. Pt follows commands on arrival; incontinent of diarrhea. 18g IV L AC, CBG 135, bp 170/110

## 2019-04-16 NOTE — ED Provider Notes (Signed)
Emergency Department Provider Note   I have reviewed the triage vital signs and the nursing notes.   HISTORY  Chief Complaint Code Stroke   HPI Christie Morrison is a 63 y.o. female with multiple medical problems documented below who presents the emergency department today as a code stroke secondary to altered mental status.  Upon further history taking it appears that the patient was probably had a seizure or syncopal episode.  She is initially decreased responsiveness but ultimately is able to answer questions.  She states she is still drinking every day and not taking her electrolyte supplementation.  Denies any other drug use.  No other recent illnesses.   No other associated or modifying symptoms.    Past Medical History:  Diagnosis Date  . Acute hepatitis   . Cirrhosis ([place])   . Crack cocaine use   . Duodenitis   . ETOH abuse   . High cholesterol   . Hypertension   . Mallory-Weiss tear   . Seizures [facility])     Patient Active Problem List   Diagnosis Date Noted  . Seizures ([place]) 04/16/2019  . Hypocalcemia 04/16/2019  . Hypomagnesemia 04/16/2019  . Prolonged QT interval 04/16/2019  . Macrocytic anemia 04/16/2019  . Hyponatremia 04/16/2019  . AKI (acute kidney injury) ([place]) 04/16/2019  . Hypokalemia 10/15/2018  . Cirrhosis, alcoholic ([place])   . Acute hepatitis   . Dehydration, severe 06/08/2016  . Elevated lactic acid level 06/08/2016  . Alcohol abuse 06/08/2016  . Melena 06/08/2016  . Cocaine abuse ([place]) 06/08/2016  . Thrombocytopenia ([place]) 06/08/2016  . Acute hypernatremia 06/08/2016  . Acute alcohol intoxication ([place]) 06/08/2016    Past Surgical History:  Procedure Laterality Date  . CESAREAN SECTION      Current Outpatient Rx  . Order #: [id number] Class: Normal  . Order #: [id number] Class: Normal    Allergies Patient has no known allergies.  Family History  Problem Relation Age of Onset  . Diabetes Mellitus II Mother   . Kidney failure Mother    . Alcohol abuse Mother   . Diabetes Mellitus II Brother   . Kidney failure Brother   . Alcohol abuse Maternal Grandmother   . Hypertension Other     Social History Social History   Tobacco Use  . Smoking status: Never Smoker  . Smokeless tobacco: Never Used  Substance Use Topics  . Alcohol use: Yes    Comment: occasionally   . Drug use: Not Currently    Frequency: 1.0 times per week    Types: Cocaine    Review of Systems  All other systems negative except as documented in the HPI. All pertinent positives and negatives as reviewed in the HPI. ____________________________________________   PHYSICAL EXAM:  VITAL SIGNS: ED Triage Vitals   Vitals:   04/16/19 0415 04/16/19 0430 04/16/19 0530  BP: (!) 145/85 (!) 142/85 (!) 143/91  Resp: 17 16 17   SpO2:   99%     Constitutional: Alert and oriented. Well appearing and in no acute distress. Eyes: Conjunctivae are normal. PERRL. EOMI. Head: Atraumatic. Nose: No congestion/rhinnorhea. Mouth/Throat: Mucous membranes are moist.  Oropharynx non-erythematous. Neck: No stridor.  No meningeal signs.   Cardiovascular: Normal rate, regular rhythm. Good peripheral circulation. Grossly normal heart sounds.   Respiratory: Normal respiratory effort.  No retractions. Lungs CTAB. Gastrointestinal: Soft and nontender. No distention.  Musculoskeletal: No lower extremity tenderness nor edema. No gross deformities of extremities. Neurologic:  Normal speech and language. No gross focal neurologic deficits  are appreciated.  Skin:  Skin is warm, dry and intact. No rash noted.   ____________________________________________   LABS (all labs ordered are listed, but only abnormal results are displayed)  Labs Reviewed  CBC - Abnormal; Notable for the following components:      Result Value   WBC 27.9 (*)    RBC 2.66 (*)    Hemoglobin 9.3 (*)    HCT 26.9 (*)    MCV 101.1 (*)    MCH 35.0 (*)    RDW 15.6 (*)    All other components  within normal limits  DIFFERENTIAL - Abnormal; Notable for the following components:   Neutro Abs 21.8 (*)    Lymphs Abs 4.5 (*)    Eosinophils Absolute 0.8 (*)    All other components within normal limits  COMPREHENSIVE METABOLIC PANEL - Abnormal; Notable for the following components:   Sodium 133 (*)    Potassium 2.9 (*)    Chloride 86 (*)    Glucose, Bld 143 (*)    Creatinine, Ser 1.62 (*)    Calcium 7.6 (*)    AST 76 (*)    Alkaline Phosphatase 148 (*)    GFR calc non Af Amer 34 (*)    GFR calc Af Amer 39 (*)    Anion gap 25 (*)    All other components within normal limits  MAGNESIUM - Abnormal; Notable for the following components:   Magnesium 0.5 (*)    All other components within normal limits  I-STAT CHEM 8, ED - Abnormal; Notable for the following components:   Sodium 131 (*)    Potassium 3.2 (*)    Chloride 88 (*)    Creatinine, Ser 1.30 (*)    Glucose, Bld 143 (*)    Calcium, Ion 0.76 (*)    Hemoglobin 11.6 (*)    HCT 34.0 (*)    All other components within normal limits  CBG MONITORING, ED - Abnormal; Notable for the following components:   Glucose-Capillary 146 (*)    All other components within normal limits  SARS CORONAVIRUS 2 (TAT 6-24 HRS)  ETHANOL  PROTIME-INR  APTT  RAPID URINE DRUG SCREEN, HOSP PERFORMED  URINALYSIS, ROUTINE W REFLEX MICROSCOPIC   ____________________________________________  EKG   EKG Interpretation  Date/Time:  Thursday April 16 2019 03:11:49 EST Ventricular Rate:  103 PR Interval:    QRS Duration: 83 QT Interval:  397 QTC Calculation: 520 R Axis:   -3 Text Interpretation: Sinus tachycardia Prolonged QT interval longer QT than july Confirmed by Merrily Pew [phone]) on 04/16/2019 5:02:33 AM       ____________________________________________  RADIOLOGY  Ct Head Code Stroke Wo Contrast  Result Date: 04/16/2019 CLINICAL DATA:  Code stroke.  Aphasia EXAM: CT HEAD WITHOUT CONTRAST TECHNIQUE: Contiguous axial  images were obtained from the base of the skull through the vertex without intravenous contrast. COMPARISON:  None. FINDINGS: Brain: There is no mass, hemorrhage or extra-axial collection. There is generalized atrophy without lobar predilection. There is hypoattenuation of the periventricular white matter, most commonly indicating chronic ischemic microangiopathy. Vascular: No abnormal hyperdensity of the major intracranial arteries or dural venous sinuses. No intracranial atherosclerosis. Skull: The visualized skull base, calvarium and extracranial soft tissues are normal. Sinuses/Orbits: No fluid levels or advanced mucosal thickening of the visualized paranasal sinuses. No mastoid or middle ear effusion. The orbits are normal. ASPECTS [facility] Stroke Program Early CT Score) - Ganglionic level infarction (caudate, lentiform nuclei, internal capsule, insula, M1-M3 cortex): 7 - Supraganglionic  infarction (M4-M6 cortex): 3 Total score (0-10 with 10 being normal): 10 IMPRESSION: 1. No intracranial hemorrhage or other acute finding. 2. ASPECTS is 10. 3. These results were communicated to Dr. Karena Addison Aroor at 3:05 am on 04/16/2019 by text page via the [facility] messaging system. Electronically Signed   By: Ulyses Jarred M.D.   On: 04/16/2019 03:06    ____________________________________________   PROCEDURES  Procedure(s) performed:   .Critical Care Performed by: Merrily Pew, MD Authorized by: Merrily Pew, MD   Critical care provider statement:    Critical care time (minutes):  45   Critical care was necessary to treat or prevent imminent or life-threatening deterioration of the following conditions:  Dehydration, endocrine crisis and CNS failure or compromise   Critical care was time spent personally by me on the following activities:  Discussions with consultants, evaluation of patient's response to treatment, examination of patient, ordering and performing treatments and interventions, ordering and  review of laboratory studies, ordering and review of radiographic studies, pulse oximetry, re-evaluation of patient's condition, obtaining history from patient or surrogate and review of old charts   ____________________________________________   INITIAL IMPRESSION / [place] / ED COURSE  No TPA as this was not clearly a stroke more consistent with seizure or syncope.  Initial i-STAT calcium was low and in the setting of a possible seizure versus syncope calcium was supplemented.  Her KG ended up showing prolonged QT with a mildly low potassium but significant no magnesium so start replete nose.  She seems to have returned to her baseline at this time but will likely need prolonged electrolyte replenishment prior to discharge will discuss with hospitalist for admission   Pertinent labs & imaging results that were available during my care of the patient were reviewed by me and considered in my medical decision making (see chart for details).  ____________________________________________  FINAL CLINICAL IMPRESSION(S) / ED DIAGNOSES  Final diagnoses:  Hypocalcemia  Hypomagnesemia  Hypokalemia  Prolonged Q-T interval on ECG     MEDICATIONS GIVEN DURING THIS VISIT:  Medications  magnesium sulfate IVPB 2 g 50 mL (2 g Intravenous New Bag/Given 04/16/19 0509)  potassium chloride 10 mEq in 100 mL IVPB (10 mEq Intravenous New Bag/Given 04/16/19 0507)  levETIRAcetam (KEPPRA) IVPB 1000 mg/100 mL premix (0 mg Intravenous Stopped 04/16/19 0343)  calcium gluconate 1 g/ 50 mL sodium chloride IVPB (0 g Intravenous Stopped 04/16/19 0454)  potassium chloride SA (KLOR-CON) CR tablet 40 mEq (40 mEq Oral Given 04/16/19 0508)     NEW OUTPATIENT MEDICATIONS STARTED DURING THIS VISIT:  New Prescriptions   No medications on file    Note:  This note was prepared with assistance of Dragon voice recognition software. Occasional wrong-word or sound-a-like substitutions may have occurred due  to the inherent limitations of voice recognition software.   Liborio Saccente, Corene Cornea, MD 04/16/19 [phone]

## 2019-04-16 NOTE — Code Documentation (Addendum)
Responded to Code Stroke called at [id number] for aphasia/confusion, [id number]. Pt was found by family on floor of bathroom with head wound, aphasic, and confused.. Pt arrived to ED at 0247, NIH-0, CBG-146, CT head negative. Pt with h/o seizures. Code Stroke cancelled at 0300. Plan to tx for seizures.

## 2019-04-16 NOTE — ED Notes (Signed)
Pt was incontinent of urine and stool. PT was cleaned and clothes placed in a patient belonging bag.

## 2019-04-16 NOTE — ED Notes (Signed)
ED Provider at bedside. 

## 2019-04-16 NOTE — Progress Notes (Signed)
Christie Morrison is a 63 y.o. F with hx of alcoholic cirrhosis, ongoing alcohol use, chronic hypomagnesemia and hypokalemia, and alcohol-related seizure in May of this year who presents with seizure.  Patient is very sleepy historian, but she relates feeling in her normal state of health until this evening.  Per report from ER triage, patient was at home tonight when roommate heard her fall in the bathroom, arrived to find her posturing, incontinent of stool.  EMS arrived and found her confused and disoriented.  In the ER, patient was tachycardic, blood pressure slightly elevated.  Initially called "code stroke" because of aphasia, but this was subsequently canceled as it was thought to be due to postictal state.  Sodium 133, potassium 2.9, magnesium 0.5, calcium 7.6, creatinine 1.6 (up from baseline 0.8), mild transaminitis, leukocytosis, macrocytic anemia.  Anion gap 25, CT head unremarkable.  She was given IV magnesium, IV calcium, IV potassium, and started on Keppra.  Neurology were consulted and the hospital service was asked to admit for severe electrolyte derangements and seizure.   04/16/19: Patient was seen and examined at her bedside in the ED this morning.  Her only child, her son present in the room.  She reports seizure-like event 2 months ago.  States the seizure-like activity that lead to this admission was worse than previous and lasted about a minute. She reports regular liquor consumption. Last liquor use was yesterday. She would like some assistance with alcohol cessation.  Per her son she is not quite back to her baseline. Will continue to closely monitor. She is on CIWA protocol. IV ativan as needed for seizures.  Please refer to H&P dictated by Dr. Loleta Books on 04/16/19 for further details of the assessment and plan.

## 2019-04-16 NOTE — H&P (Addendum)
History and Physical  Patient Name: Christie Morrison     [id number]    DOB: 1955/10/05    DOA: 04/16/2019 PCP: Gildardo Pounds, NP  Patient coming from: Home  Chief Complaint: Seizure      HPI: Christie Morrison is a 63 y.o. F with hx of alcoholic cirrhosis, ongoing alcohol use, chronic hypomagnesemia and hypokalemia, and alcohol-related seizure in May of this year who presents with seizure.  Patient is very sleepy historian, but she relates feeling in her normal state of health until this evening.  Per report from ER triage, patient was at home tonight when roommate heard her fall in the bathroom, arrived to find her posturing, incontinent of stool.  EMS arrived and found her confused and disoriented.  In the ER, patient was tachycardic, blood pressure slightly elevated.  Initially called "code stroke" because of aphasia, but this was subsequently canceled as it was thought to be due to postictal state.  Sodium 133, potassium 2.9, magnesium 0.5, calcium 7.6, creatinine 1.6 (up from baseline 0.8), mild transaminitis, leukocytosis, macrocytic anemia.  Anion gap 25, CT head unremarkable.  She was given IV magnesium, IV calcium, IV potassium, and started on Keppra.  Neurology were consulted and the hospital service was asked to admit for severe electrolyte derangements and seizure.      ROS: Review of Systems  Constitutional: Positive for chills and malaise/fatigue. Negative for fever.  Musculoskeletal: Positive for joint pain.  Neurological: Positive for seizures and loss of consciousness. Negative for dizziness, sensory change, speech change, focal weakness, weakness and headaches.  Psychiatric/Behavioral: Positive for substance abuse.  All other systems reviewed and are negative.         Past Medical History:  Diagnosis Date  . Acute hepatitis   . Cirrhosis ([place])   . Crack cocaine use   . Duodenitis   . ETOH abuse   . High cholesterol   . Hypertension   .  Mallory-Weiss tear   . Seizures ([place])     Past Surgical History:  Procedure Laterality Date  . CESAREAN SECTION      Social History: Patient lives with her roommate and son.  The patient walks unassisted.  Never smoker.  Claims she uses alcohol "occasionally", is vague about amounts, evasive.  Works at a [employer].  No Known Allergies  Family history: family history includes Alcohol abuse in her maternal grandmother and mother; Diabetes Mellitus II in her brother and mother; Hypertension in an other family member; Kidney failure in her brother and mother.  Prior to Admission medications   Medication Sig Start Date End Date Taking? Authorizing Provider  Magnesium Oxide 400 (240 Mg) MG TABS Take 1 tablet (400 mg total) by mouth daily. 10/17/18   [place] Donnamarie Poag, DO  potassium chloride SA (K-DUR) 20 MEQ tablet Take 1 tablet (20 mEq total) by mouth daily. 10/17/18   [place] Eric J, DO       Physical Exam: BP (!) 143/91   Resp 17   SpO2 99%  General appearance: Thin adult female, awake but sleepy, in no obvious distress.   Eyes: Slightly icteric, conjunctiva pink, lids and lashes normal. PERRL.    ENT: No nasal deformity, discharge, epistaxis.  Hearing normal. OP moist without lesions.   Neck: No neck masses.  Trachea midline.  No thyromegaly/tenderness. Lymph: No cervical or supraclavicular lymphadenopathy. Skin: Warm and dry.  No jaundice.  No suspicious rashes or lesions. Cardiac: RRR, nl S1-S2, no murmurs appreciated.  Capillary refill  is brisk.  No JVD.  No LE edema.  Radial pulses 2+ and symmetric. Respiratory: Normal respiratory rate and rhythm.  CTAB without rales or wheezes. Abdomen: Abdomen soft.  No TTP or guarding. No ascites, distension, hepatosplenomegaly.   MSK: No deformities or effusions of the large joints of the upper or lower extremities bilaterally.  No cyanosis or clubbing.  Diffuse loss of subcutaneous muscle mass and fat. Neuro: Cranial nerves symmetric.   Sensation intact to light touch. Speech is fluent.  Muscle strength 5 -/5 in upper and lower extremities bilaterally.    Psych: Sensorium intact and responding to questions, attention diminished.  Psychomotor slowing noted.  Behavior appropriate.  Affect blunted.  Judgment and insight appear slightly impaired at the moment..     Labs on Admission:  I have personally reviewed following labs and imaging studies: CBC: Recent Labs  Lab 04/16/19 0252 04/16/19 0255  WBC 27.9*  --   NEUTROABS 21.8*  --   HGB 9.3* 11.6*  HCT 26.9* 34.0*  MCV 101.1*  --   PLT 165  --    Basic Metabolic Panel: Recent Labs  Lab 04/16/19 0252 04/16/19 0255 04/16/19 0334  NA 133* 131*  --   K 2.9* 3.2*  --   CL 86* 88*  --   CO2 22  --   --   GLUCOSE 143* 143*  --   BUN 9 10  --   CREATININE 1.62* 1.30*  --   CALCIUM 7.6*  --   --   MG  --   --  0.5*   GFR: CrCl cannot be calculated (Unknown ideal weight.).  Liver Function Tests: Recent Labs  Lab 04/16/19 0252  AST 76*  ALT 19  ALKPHOS 148*  BILITOT 1.2  PROT 7.7  ALBUMIN 3.9   Coagulation Profile: Recent Labs  Lab 04/16/19 0252  INR 1.2   CBG: Recent Labs  Lab 04/16/19 0250  GLUCAP 146*          Radiological Exams on Admission: Personally reviewed CT head report, reviewed above: Ct Head Code Stroke Wo Contrast  Result Date: 04/16/2019 CLINICAL DATA:  Code stroke.  Aphasia EXAM: CT HEAD WITHOUT CONTRAST TECHNIQUE: Contiguous axial images were obtained from the base of the skull through the vertex without intravenous contrast. COMPARISON:  None. FINDINGS: Brain: There is no mass, hemorrhage or extra-axial collection. There is generalized atrophy without lobar predilection. There is hypoattenuation of the periventricular white matter, most commonly indicating chronic ischemic microangiopathy. Vascular: No abnormal hyperdensity of the major intracranial arteries or dural venous sinuses. No intracranial atherosclerosis. Skull: The  visualized skull base, calvarium and extracranial soft tissues are normal. Sinuses/Orbits: No fluid levels or advanced mucosal thickening of the visualized paranasal sinuses. No mastoid or middle ear effusion. The orbits are normal. ASPECTS [facility] Stroke Program Early CT Score) - Ganglionic level infarction (caudate, lentiform nuclei, internal capsule, insula, M1-M3 cortex): 7 - Supraganglionic infarction (M4-M6 cortex): 3 Total score (0-10 with 10 being normal): 10 IMPRESSION: 1. No intracranial hemorrhage or other acute finding. 2. ASPECTS is 10. 3. These results were communicated to Dr. Karena Addison Aroor at 3:05 am on 04/16/2019 by text page via the [facility] messaging system. Electronically Signed   By: Ulyses Jarred M.D.   On: 04/16/2019 03:06    EKG: Independently reviewed.  Rate 103, QTc 520, no ST changes       Assessment/Plan    Seizure Second time seizure, provoked in the setting of severe electrolyte derangements. -Correct electrolytes -  Seizure precautions -Consult neurology, appreciate recommendations -AEDs per neurology  ADDENDUM: Disussed with Neurology, we may forgo AEDs, but they would prefer schedule benzodiazepine taper with Librium, given her high risk of seizure.      Hypomagnesemia Hypocalcemia Hypokalemia Hyponatremia -Repeat IV magnesium and calcium now -Continue IV potassium -Resume home magnesium and potassium oral supplements  -Repeat calcium, magnesium, potassium level at noon and repeat dosing if needed  Acute kidney injury, suspected Unclear baseline. -IV fluids and trend creatinine  Prolonged QT interval -Monitor on telemetry for now -Correct electrolyte derangements  Macrocytic anemia from alcohol.  Worse than previous. -Check B12 and folate -Check TSH  Alcohol use disorder -Start thiamine and folate -CIWA scoring  -Librium taper         DVT prophylaxis: Lovenox Code Status: Full code Family Communication:   Disposition Plan:  Anticipate IV fluids, IV electrolyte replacement, close monitoring of electrolytes. Consults called: Neurology Admission status: OBS   At the point of initial evaluation, it is my clinical opinion that admission for OBSERVATION is reasonable and necessary because the patient's presenting complaints in the context of their chronic conditions represent sufficient risk of deterioration or significant morbidity to constitute reasonable grounds for close observation in the hospital setting, but that the patient may be medically stable for discharge from the hospital within 24 to 48 hours.    Medical decision making: Patient seen at 5:49 AM on 04/16/2019.  The patient was discussed with Dr. Dayna Barker.  What exists of the patient's chart was reviewed in depth and summarized above.  Clinical condition: Stable.        [facility] Please page though [facility] or Epic secure chat:  For password, contact charge nurse

## 2019-04-16 NOTE — ED Notes (Signed)
ED TO INPATIENT HANDOFF REPORT  ED Nurse Name and Phone #: Jaqwan Wieber  S Name/Age/Gender Christie Morrison 63 y.o. female Room/Bed: 053C/053C  Code Status   Code Status: Full Code  Home/SNF/Other Home Patient oriented to: self, place, time and situation Is this baseline? Yes   Triage Complete: Triage complete  Chief Complaint stroke  Triage Note Pt arrived by Code Stroke ,LNW at 1800. Per EMS, pt found lying in the floor of the bathroom, nonverbal. Has been seen for seizures in the past, reported to ems that there was no diagnosed hx of seizures. Pt follows commands on arrival; incontinent of diarrhea. 18g IV L AC, CBG 135, bp 170/110   Allergies No Known Allergies  Level of Care/Admitting Diagnosis ED Disposition    ED Disposition Condition [facility] Area: [place] [100100]  Level of Care: Telemetry Medical [104]  I expect the patient will be discharged within 24 hours: No (not a candidate for 5C-Observation unit)  Covid Evaluation: Asymptomatic Screening Protocol (No Symptoms)  Diagnosis: Seizures ([place]) [id number]  Admitting Physician: Edwin Dada [id number]  Attending Physician: Edwin Dada [id number]  PT Class (Do Not Modify): Observation [104]  PT Acc Code (Do Not Modify): Observation [10022]       B Medical/Surgery History Past Medical History:  Diagnosis Date  . Acute hepatitis   . Cirrhosis ([place])   . Crack cocaine use   . Duodenitis   . ETOH abuse   . High cholesterol   . Hypertension   . Oksana Deberry-Weiss tear   . Seizures ([place])    Past Surgical History:  Procedure Laterality Date  . CESAREAN SECTION       A IV Location/Drains/Wounds Patient Lines/Drains/Airways Status   Active Line/Drains/Airways    Name:   Placement date:   Placement time:   Site:   Days:   Peripheral IV 04/16/19 Left Antecubital   04/16/19    0303    Antecubital   less than 1          Intake/Output Last 24  hours  Intake/Output Summary (Last 24 hours) at 04/16/2019 1416 Last data filed at 04/16/2019 [id number] Gross per 24 hour  Intake 300 ml  Output -  Net 300 ml    Labs/Imaging Results for orders placed or performed during the hospital encounter of 04/16/19 (from the past 48 hour(s))  CBG monitoring, ED     Status: Abnormal   Collection Time: 04/16/19  2:50 AM  Result Value Ref Range   Glucose-Capillary 146 (H) 70 - 99 mg/dL  Ethanol     Status: None   Collection Time: 04/16/19  2:52 AM  Result Value Ref Range   Alcohol, Ethyl (B) <10 <10 mg/dL    Comment: (NOTE) Lowest detectable limit for serum alcohol is 10 mg/dL. For medical purposes only. Performed at [facility], [place] [address], [place], [place] [address]   Protime-INR     Status: None   Collection Time: 04/16/19  2:52 AM  Result Value Ref Range   Prothrombin Time 14.7 11.4 - 15.2 seconds   INR 1.2 0.8 - 1.2    Comment: (NOTE) INR goal varies based on device and disease states. Performed at [facility], [place] [address], [place], [place] [address]   APTT     Status: None   Collection Time: 04/16/19  2:52 AM  Result Value Ref Range   aPTT 25 24 - 36 seconds  Comment: Performed at [facility], [place] [address], [place], [place] [address]  CBC     Status: Abnormal   Collection Time: 04/16/19  2:52 AM  Result Value Ref Range   WBC 27.9 (H) 4.0 - 10.5 K/uL   RBC 2.66 (L) 3.87 - 5.11 MIL/uL   Hemoglobin 9.3 (L) 12.0 - 15.0 g/dL   HCT 26.9 (L) 36.0 - 46.0 %   MCV 101.1 (H) 80.0 - 100.0 fL   MCH 35.0 (H) 26.0 - 34.0 pg   MCHC 34.6 30.0 - 36.0 g/dL   RDW 15.6 (H) 11.5 - 15.5 %   Platelets 165 150 - 400 K/uL   nRBC 0.1 0.0 - 0.2 %    Comment: Performed at [facility], [place] [address] [place], [place] [address]  Differential     Status: Abnormal   Collection Time: 04/16/19  2:52 AM  Result Value Ref Range   Neutrophils Relative % 78 %   Neutro Abs 21.8 (H) 1.7 - 7.7 K/uL   Lymphocytes Relative  16 %   Lymphs Abs 4.5 (H) 0.7 - 4.0 K/uL   Monocytes Relative 3 %   Monocytes Absolute 0.8 0.1 - 1.0 K/uL   Eosinophils Relative 3 %   Eosinophils Absolute 0.8 (H) 0.0 - 0.5 K/uL   Basophils Relative 0 %   Basophils Absolute 0.0 0.0 - 0.1 K/uL   WBC Morphology See Note     Comment: Vaculated Neutrophils   nRBC 0 0 /100 WBC   Abs Immature Granulocytes 0.00 0.00 - 0.07 K/uL   Polychromasia PRESENT    Target Cells PRESENT     Comment: Performed at [facility], [place] [address] [place], [place] [address]  Comprehensive metabolic panel     Status: Abnormal   Collection Time: 04/16/19  2:52 AM  Result Value Ref Range   Sodium 133 (L) 135 - 145 mmol/L   Potassium 2.9 (L) 3.5 - 5.1 mmol/L   Chloride 86 (L) 98 - 111 mmol/L   CO2 22 22 - 32 mmol/L   Glucose, Bld 143 (H) 70 - 99 mg/dL   BUN 9 8 - 23 mg/dL   Creatinine, Ser 1.62 (H) 0.44 - 1.00 mg/dL   Calcium 7.6 (L) 8.9 - 10.3 mg/dL   Total Protein 7.7 6.5 - 8.1 g/dL   Albumin 3.9 3.5 - 5.0 g/dL   AST 76 (H) 15 - 41 U/L   ALT 19 0 - 44 U/L   Alkaline Phosphatase 148 (H) 38 - 126 U/L   Total Bilirubin 1.2 0.3 - 1.2 mg/dL   GFR calc non Af Amer 34 (L) >60 mL/min   GFR calc Af Amer 39 (L) >60 mL/min   Anion gap 25 (H) 5 - 15    Comment: Performed at [facility], [place] [address], [place], [place] [address]  I-stat chem 8, ED     Status: Abnormal   Collection Time: 04/16/19  2:55 AM  Result Value Ref Range   Sodium 131 (L) 135 - 145 mmol/L   Potassium 3.2 (L) 3.5 - 5.1 mmol/L   Chloride 88 (L) 98 - 111 mmol/L   BUN 10 8 - 23 mg/dL   Creatinine, Ser 1.30 (H) 0.44 - 1.00 mg/dL   Glucose, Bld 143 (H) 70 - 99 mg/dL   Calcium, Ion 0.76 (LL) 1.15 - 1.40 mmol/L   TCO2 26 22 - 32 mmol/L   Hemoglobin 11.6 (L) 12.0 - 15.0 g/dL  HCT 34.0 (L) 36.0 - 46.0 %   Comment NOTIFIED PHYSICIAN   Magnesium     Status: Abnormal   Collection Time: 04/16/19  3:34 AM  Result Value Ref Range   Magnesium 0.5 (LL) 1.7 - 2.4 mg/dL    Comment:  CRITICAL RESULT CALLED TO, READ BACK BY AND VERIFIED WITH: RN J ESLEY @0431  04/16/19 BY S GEZAHEGN Performed at [facility], [place] [address], [place], [place] [address]   SARS CORONAVIRUS 2 (TAT 6-24 HRS) Nasopharyngeal Nasopharyngeal Swab     Status: None   Collection Time: 04/16/19  5:40 AM   Specimen: Nasopharyngeal Swab  Result Value Ref Range   SARS Coronavirus 2 NEGATIVE NEGATIVE    Comment: (NOTE) SARS-CoV-2 target nucleic acids are NOT DETECTED. The SARS-CoV-2 RNA is generally detectable in upper and lower respiratory specimens during the acute phase of infection. Negative results do not preclude SARS-CoV-2 infection, do not rule out co-infections with other pathogens, and should not be used as the sole basis for treatment or other patient management decisions. Negative results must be combined with clinical observations, patient history, and epidemiological information. The expected result is Negative. Fact Sheet for Patients: SugarRoll.be Fact Sheet for Healthcare Providers: https://www.woods-mathews.com/ This test is not yet approved or cleared by the [place] FDA and  has been authorized for detection and/or diagnosis of SARS-CoV-2 by FDA under an Emergency Use Authorization (EUA). This EUA will remain  in effect (meaning this test can be used) for the duration of the COVID-19 declaration under Section 56 4(b)(1) of the Act, 21 U.S.C. section 360bbb-3(b)(1), unless the authorization is terminated or revoked sooner. Performed at [facility], [place] [address], [place], [place] [address]   Renal function panel     Status: Abnormal   Collection Time: 04/16/19 11:59 AM  Result Value Ref Range   Sodium 135 135 - 145 mmol/L   Potassium 4.2 3.5 - 5.1 mmol/L   Chloride 93 (L) 98 - 111 mmol/L   CO2 27 22 - 32 mmol/L   Glucose, Bld 96 70 - 99 mg/dL   BUN 9 8 - 23 mg/dL   Creatinine, Ser 1.46 (H) 0.44 - 1.00 mg/dL    Calcium 8.0 (L) 8.9 - 10.3 mg/dL   Phosphorus 1.6 (L) 2.5 - 4.6 mg/dL   Albumin 3.4 (L) 3.5 - 5.0 g/dL   GFR calc non Af Amer 38 (L) >60 mL/min   GFR calc Af Amer 44 (L) >60 mL/min   Anion gap 15 5 - 15    Comment: Performed at [facility] [address] [place], [place] [address]  Magnesium     Status: Abnormal   Collection Time: 04/16/19 11:59 AM  Result Value Ref Range   Magnesium 1.1 (L) 1.7 - 2.4 mg/dL    Comment: Performed at [facility] [address] [place], [place] [address]  Vitamin B12     Status: None   Collection Time: 04/16/19 11:59 AM  Result Value Ref Range   Vitamin B-12 309 180 - 914 pg/mL    Comment: (NOTE) This assay is not validated for testing neonatal or myeloproliferative syndrome specimens for Vitamin B12 levels. Performed at [facility], [place] [address] [place],  [address]   TSH     Status: None   Collection Time: 04/16/19 11:59 AM  Result Value Ref Range   TSH 2.706 0.350 - 4.500 uIU/mL    Comment: Performed by a 3rd Generation  assay with a functional sensitivity of <=0.01 uIU/mL. Performed at [facility], [place] [address], [place], [place] [address]    Ct Head Code Stroke Wo Contrast  Result Date: 04/16/2019 CLINICAL DATA:  Code stroke.  Aphasia EXAM: CT HEAD WITHOUT CONTRAST TECHNIQUE: Contiguous axial images were obtained from the base of the skull through the vertex without intravenous contrast. COMPARISON:  None. FINDINGS: Brain: There is no mass, hemorrhage or extra-axial collection. There is generalized atrophy without lobar predilection. There is hypoattenuation of the periventricular white matter, most commonly indicating chronic ischemic microangiopathy. Vascular: No abnormal hyperdensity of the major intracranial arteries or dural venous sinuses. No intracranial atherosclerosis. Skull: The visualized skull base, calvarium and extracranial soft tissues are normal. Sinuses/Orbits: No fluid levels or advanced  mucosal thickening of the visualized paranasal sinuses. No mastoid or middle ear effusion. The orbits are normal. ASPECTS [facility] Stroke Program Early CT Score) - Ganglionic level infarction (caudate, lentiform nuclei, internal capsule, insula, M1-M3 cortex): 7 - Supraganglionic infarction (M4-M6 cortex): 3 Total score (0-10 with 10 being normal): 10 IMPRESSION: 1. No intracranial hemorrhage or other acute finding. 2. ASPECTS is 10. 3. These results were communicated to Dr. Karena Addison Aroor at 3:05 am on 04/16/2019 by text page via the [facility] messaging system. Electronically Signed   By: Ulyses Jarred M.D.   On: 04/16/2019 03:06    Pending Labs Unresulted Labs (From admission, onward)    Start     Ordered   04/23/19 0500  Creatinine, serum  (enoxaparin (LOVENOX)    CrCl >/= 30 ml/min)  Weekly,   R    Comments: while on enoxaparin therapy    04/16/19 0938   04/17/19 [id number]  Basic metabolic panel  Tomorrow morning,   R     04/16/19 0938   04/17/19 0500  Magnesium  Tomorrow morning,   R     04/16/19 0938   04/17/19 0500  CBC  Tomorrow morning,   R     04/16/19 0938   04/16/19 1200  Calcium, ionized  Once-Timed,   STAT     04/16/19 0938   04/16/19 1200  Folate RBC  Once-Timed,   STAT     04/16/19 0938   04/16/19 0249  Urine rapid drug screen (hosp performed)  ONCE - STAT,   STAT     04/16/19 0249   04/16/19 0249  Urinalysis, Routine w reflex microscopic  ONCE - STAT,   STAT     04/16/19 0249          Vitals/Pain Today's Vitals   04/16/19 1346 04/16/19 1353 04/16/19 1356 04/16/19 1358  BP:      Pulse: 87 85 85 85  Resp:      SpO2: 99% 97% 99% 99%  PainSc:        Isolation Precautions No active isolations  Medications Medications  magnesium oxide (MAG-OX) tablet 400 mg (400 mg Oral Given 04/16/19 1202)  potassium chloride SA (KLOR-CON) CR tablet 20 mEq (20 mEq Oral Given 04/16/19 1202)  0.9 % NaCl with KCl 20 mEq/ L  infusion ( Intravenous New Bag/Given 04/16/19 1212)  enoxaparin  (LOVENOX) injection 40 mg (40 mg Subcutaneous Given 04/16/19 1203)  acetaminophen (TYLENOL) tablet 650 mg (has no administration in time range)    Or  acetaminophen (TYLENOL) suppository 650 mg (has no administration in time range)  thiamine (VITAMIN B-1) tablet 100 mg (100 mg Oral Given 04/16/19 0929)    Or  thiamine (B-1) injection 100 mg ( Intravenous  See Alternative [id number])  folic acid (FOLVITE) tablet 1 mg (1 mg Oral Given 04/16/19 0929)  multivitamin with minerals tablet 1 tablet (1 tablet Oral Given 04/16/19 1202)  LORazepam (ATIVAN) injection 0.5 mg (has no administration in time range)  multivitamin with minerals tablet 1 tablet (1 tablet Oral Not Given 04/16/19 1226)  chlordiazePOXIDE (LIBRIUM) capsule 25 mg (25 mg Oral Not Given 04/16/19 1121)    Followed by  chlordiazePOXIDE (LIBRIUM) capsule 25 mg (has no administration in time range)    Followed by  chlordiazePOXIDE (LIBRIUM) capsule 25 mg (has no administration in time range)    Followed by  chlordiazePOXIDE (LIBRIUM) capsule 25 mg (has no administration in time range)  chlordiazePOXIDE (LIBRIUM) capsule 25 mg (25 mg Oral Given 04/16/19 0929)  levETIRAcetam (KEPPRA) IVPB 1000 mg/100 mL premix (0 mg Intravenous Stopped 04/16/19 0343)  calcium gluconate 1 g/ 50 mL sodium chloride IVPB (0 g Intravenous Stopped 04/16/19 0454)  magnesium sulfate IVPB 2 g 50 mL (0 g Intravenous Stopped 04/16/19 0616)  potassium chloride SA (KLOR-CON) CR tablet 40 mEq (40 mEq Oral Given 04/16/19 0508)  potassium chloride 10 mEq in 100 mL IVPB (0 mEq Intravenous Stopped 04/16/19 1028)  magnesium sulfate IVPB 2 g 50 mL (0 g Intravenous Stopped 04/16/19 1314)  calcium gluconate 1 g/ 50 mL sodium chloride IVPB (0 g Intravenous Stopped 04/16/19 1211)  chlordiazePOXIDE (LIBRIUM) capsule 50 mg (50 mg Oral Given 04/16/19 0648)    Mobility walks Moderate fall risk   Focused Assessments Neuro Assessment Handoff:  Swallow screen pass? Yes     NIH Stroke Scale ( + Modified Stroke Scale Criteria)  Interval: Initial Level of Consciousness (1a.)   : Alert, keenly responsive LOC Questions (1b. )   +: Answers both questions correctly LOC Commands (1c. )   + : Performs both tasks correctly Best Gaze (2. )  +: Normal Visual (3. )  +: No visual loss Facial Palsy (4. )    : Normal symmetrical movements Motor Arm, Left (5a. )   +: No drift Motor Arm, Right (5b. )   +: No drift Motor Leg, Left (6a. )   +: No drift Motor Leg, Right (6b. )   +: No drift Limb Ataxia (7. ): Absent Sensory (8. )   +: Normal, no sensory loss Best Language (9. )   +: No aphasia Dysarthria (10. ): Normal Extinction/Inattention (11.)   +: No Abnormality Modified SS Total  +: 0 Complete NIHSS TOTAL: 0 Last date known well: 04/16/19 Last time known well: 1800 Neuro Assessment: Exceptions to WDL Neuro Checks:   Initial (04/16/19 0300)  Last Documented NIHSS Modified Score: 0 (04/16/19 1130) Has TPA been given? No If patient is a Neuro Trauma and patient is going to OR before floor call report to [place] nurse: [phone] or [phone]     R Recommendations: See Admitting Provider Note  Report given to:   Additional Notes:

## 2019-04-16 NOTE — ED Notes (Signed)
Diet was ordered for Lunch. 

## 2019-04-16 NOTE — Procedures (Signed)
Patient Name: Christie Morrison  MRN: [id number]  Epilepsy Attending: Lora Havens  Referring Physician/Provider: Dr Remi Haggard Aroor Date: 04/16/2019  Duration: 24.31mins  Patient history: 63 year old female with history of seizure disorder and substance abuse presented with a seizure.  EEG to evaluate for seizures.  Level of alertness: Awake, sleep  AEDs during EEG study: Librium  Technical aspects: This EEG study was done with scalp electrodes positioned according to the 10-20 International system of electrode placement. Electrical activity was acquired at a sampling rate of 500Hz  and reviewed with a high frequency filter of 70Hz  and a low frequency filter of 1Hz . EEG data were recorded continuously and digitally stored.   Description: During awake state,the posterior dominant rhythm consists of 8-9 Hz activity of moderate voltage (25-35 uV) seen predominantly in posterior head regions, symmetric and reactive to eye opening and eye closing.  Sleep was characterized by vertex waves, sleep spindles (12 to 14 Hz), maximal frontocentral and generalized 5 to 6 Hz theta slowing.Marland Kitchen Hyperventilation and photic stimulation were not performed.  IMPRESSION: This study is within normal limits. No seizures or epileptiform discharges were seen throughout the recording.  Nathyn Luiz Barbra Sarks

## 2019-04-16 NOTE — Progress Notes (Signed)
EEG complete - results pending 

## 2019-04-16 NOTE — Consult Note (Addendum)
Requesting Physician: Dr. Francia Greaves    Chief Complaint: Seizure  History obtained from: Patient and Chart   HPI:                                                                                                                                       Christie Morrison is a 63 y.o. female with past medical history significant for seizures, alcohol abuse, illicit substance abuse, cirrhosis presents to the emergency department after family found her on the floor in the bathroom.  According to EMS, around 2 AM patient went to the bathroom and family heard a thud.  She was found on the floor of the bathroom with her head banged against the wall and posturing with both arms.  When EMS arrived, patient was incontinent of stools and was not answering any questions or following commands.  Due to concern for " aphasia" patient was brought in as a code stroke.  According to EMS, they did not witness any seizure activity.  In route patient became more awake and answering simple questions such as her name.  On assessment when she arrived at [facility], she had no focal weakness and answering simple questions but confused.  Tongue bite visible on exam as well patient incontinent of urine and stool.  Stat CT head was obtained which showed no hemorrhage including subdural hemorrhage.  Stroke alert was canceled as likely this was a seizure with postictal state.   Past Medical History:  Diagnosis Date  . Acute hepatitis   . Cirrhosis ([place])   . Crack cocaine use   . Duodenitis   . ETOH abuse   . High cholesterol   . Mallory-Weiss tear   . Seizures ([place])     Past Surgical History:  Procedure Laterality Date  . CESAREAN SECTION      Family History  Problem Relation Age of Onset  . Diabetes Mellitus II Mother   . Kidney failure Mother   . Alcohol abuse Mother   . Diabetes Mellitus II Brother   . Kidney failure Brother   . Alcohol abuse Maternal Grandmother    Social History:  reports that she has never  smoked. She has never used smokeless tobacco. She reports current alcohol use. She reports previous drug use. Frequency: 1.00 time per week. Drug: Cocaine.  Allergies: No Known Allergies  Medications:  I reviewed home medications   ROS:                                                                                                                                     Limited due to patient's mental status, no obvious difficulty breathing, shortness of breath no chest pain, denies fever, chills.   Examination:                                                                                                      General: Appears well-developed and well-nourished.  Psych: Affect appropriate to situation Eyes: No scleral injection HENT: No OP obstrucion Head: Normocephalic.  Cardiovascular: Normal rate and regular rhythm.  Respiratory: Effort normal and breath sounds normal to anterior ascultation GI: Soft.  No distension. There is no tenderness.  Skin: WDI    Neurological Examination Mental Status: Alert, but oriented only to herself.  States that she is in her 32s.  Cannot name month or recent holiday.  Following simple commands Cranial Nerves: II: Visual fields grossly normal,  III,IV, VI: ptosis not present, extra-ocular motions intact bilaterally, pupils equal, round, reactive to light and accommodation V,VII: smile symmetric, facial light touch sensation normal bilaterally VIII: hearing normal bilaterally IX,X: uvula rises symmetrically XI: bilateral shoulder shrug XII: midline tongue extension Motor: Right : Upper extremity   5/5    Left:     Upper extremity   5/5  Lower extremity   5/5     Lower extremity   5/5 Tone and bulk:normal tone throughout; no atrophy noted Sensory: Pinprick and light touch intact throughout, bilaterally Deep Tendon Reflexes: 2+ and  symmetric throughout Plantars: Right: downgoing   Left: downgoing Cerebellar: normal finger-to-nose,  Gait: Not assessed due to safety     Lab Results: Basic Metabolic Panel: Recent Labs  Lab 04/16/19 0255  NA 131*  K 3.2*  CL 88*  GLUCOSE 143*  BUN 10  CREATININE 1.30*    CBC: Recent Labs  Lab 04/16/19 0252 04/16/19 0255  WBC 27.9*  --   HGB 9.3* 11.6*  HCT 26.9* 34.0*  MCV 101.1*  --   PLT 165  --     Coagulation Studies: Recent Labs    04/16/19 0252  LABPROT 14.7  INR 1.2    Imaging: Ct Head Code Stroke Wo Contrast  Result Date: 04/16/2019 CLINICAL DATA:  Code stroke.  Aphasia EXAM: CT HEAD WITHOUT CONTRAST TECHNIQUE: Contiguous axial images were obtained from the base of the skull through  the vertex without intravenous contrast. COMPARISON:  None. FINDINGS: Brain: There is no mass, hemorrhage or extra-axial collection. There is generalized atrophy without lobar predilection. There is hypoattenuation of the periventricular white matter, most commonly indicating chronic ischemic microangiopathy. Vascular: No abnormal hyperdensity of the major intracranial arteries or dural venous sinuses. No intracranial atherosclerosis. Skull: The visualized skull base, calvarium and extracranial soft tissues are normal. Sinuses/Orbits: No fluid levels or advanced mucosal thickening of the visualized paranasal sinuses. No mastoid or middle ear effusion. The orbits are normal. ASPECTS [facility] Stroke Program Early CT Score) - Ganglionic level infarction (caudate, lentiform nuclei, internal capsule, insula, M1-M3 cortex): 7 - Supraganglionic infarction (M4-M6 cortex): 3 Total score (0-10 with 10 being normal): 10 IMPRESSION: 1. No intracranial hemorrhage or other acute finding. 2. ASPECTS is 10. 3. These results were communicated to Dr. Karena Addison Jordyne Poehlman at 3:05 am on 04/16/2019 by text page via the [facility] messaging system. Electronically Signed   By: Ulyses Jarred M.D.   On: 04/16/2019  03:06     I have reviewed the above imaging: CT head   ASSESSMENT AND PLAN   Seizure, possibly provoked in the setting of electolyte disturbances, alcohol withdrawal Hypomagnesemia Hypokalemia  Recommendations Loaded with 1 g of Keppra and start Keppra 500 mg twice daily for now, however since likely provoked in the setting of alcohol withdrawal and electrolyte imbalances would not favor long-term continuation of AEDs on discharge if routine EEG is negative Routine EEG CIWA protocol and consider scheduled benzos empiric thiamine Obtain urine drug screen, EtOH level, CMP, ammonia, CBC Seizure precautions No driving for 6 months Observe for few hours if patient returns to her baseline can be discharged with outpatient neurology follow-up    Merlen Gurry [facility] Pager Number [phone]

## 2019-04-16 NOTE — Plan of Care (Addendum)
Brief history: Christie Morrison is a 63 y.o. female with past medical history significant for seizures, alcohol abuse, illicit substance abuse, cirrhosis admitted following a suspected seizure episode involving stool incontinence, tongue biting, posturing, and post-ictal state.  CTH was neg for acute changes, no hemorrhage. EEG neg for seizure.  Subjective: On eval this afternoon, patient is somnolent but arousable to loud voice, alert and oriented x3, able to follow commands, mild weakness of BUE, mild R facial droop. Reports she usually drinks liquor nightly, drinks a fifth, last cocaine use was 1 week ago. Pt reports her last drink was almost 48 hours ago and she reports drinking less than what she usually drinks but unable to quantify further. Of note, she received librium at 0930.  Objective: Vitals:   04/16/19 0900 04/16/19 1216  BP: 109/69 (!) 144/87  Pulse: (!) 103 82  Resp:  (!) 22  SpO2: 100% 99%   Exam: GEN: NAD, sleeping CVS: RRR, no carotid bruit CHEST: No signs of resp distress, on room air ABD: Soft, NTTP  NEURO:  MENTAL STATUS: AAOx3 , somnolent but arouses to loud voice LANG/SPEECH: dysarthric, no signs of aphasia CRANIAL NERVES:  II: Pupils equal and reactive, no RAPD, normal visual field and fundus  III, IV, VI: EOM intact, no gaze preference or deviation  V: normal  VII: no facial asymmetry  VIII: normal hearing to speech  MOTOR: 4/5 in both upper extremities and 5/5 lower extremities  REFLEXES: 2/4 throughout, bilateral flexor planters  SENSORY: Normal to touch, temperature & pin prick in all extremiteis  COORD: UTA  LABS:  CBC    Component Value Date/Time   WBC 27.9 (H) 04/16/2019 0252   RBC 2.66 (L) 04/16/2019 0252   HGB 11.6 (L) 04/16/2019 0255   HGB 12.2 11/25/2017 0940   HCT 34.0 (L) 04/16/2019 0255   HCT 36.6 11/25/2017 0940   PLT 165 04/16/2019 0252   PLT 276 11/25/2017 0940   MCV 101.1 (H) 04/16/2019 0252   MCV 103 (H) 11/25/2017 0940   MCH 35.0 (H) 04/16/2019 0252   MCHC 34.6 04/16/2019 0252   RDW 15.6 (H) 04/16/2019 0252   RDW 14.6 11/25/2017 0940   LYMPHSABS 4.5 (H) 04/16/2019 0252   MONOABS 0.8 04/16/2019 0252   EOSABS 0.8 (H) 04/16/2019 0252   BASOSABS 0.0 04/16/2019 0252    BMP Latest Ref Rng & Units 04/16/2019 04/16/2019 04/16/2019  Glucose 70 - 99 mg/dL 96 143(H) 143(H)  BUN 8 - 23 mg/dL 9 10 9   Creatinine 0.44 - 1.00 mg/dL 1.46(H) 1.30(H) 1.62(H)  BUN/Creat Ratio 12 - 28 - - -  Sodium 135 - 145 mmol/L 135 131(L) 133(L)  Potassium 3.5 - 5.1 mmol/L 4.2 3.2(L) 2.9(L)  Chloride 98 - 111 mmol/L 93(L) 88(L) 86(L)  CO2 22 - 32 mmol/L 27 - 22  Calcium 8.9 - 10.3 mg/dL 8.0(L) - 7.6(L)   Ethanol < 10  Ca 0.76 Mg 1.1 K 3.2  ASSESSMENT AND PLAN   1. Seizure, possibly provoked in the setting of electolyte disturbances, possible alcohol withdrawal  - loaded with 1g keppra  able for acute processes  - CIWA protocol and consider scheduled benzos  - High dose thiamine   - seizure precautions, no driving 6 mos  - UDS and UA pending  2. Hypomagnesemia/Hypokalemia  - Replete Mg and K   Posey Pronto PA-C [facility] [phone]  I have seen the patient and reviewed the above note.  Though she did drink on Tuesday, it was  considerably less than her typical fifth of vodka.  I think there may be a component of alcohol withdrawal, which coupled with the multiple electrolyte abnormalities make me think that this was a provoked seizure.  She states that her previous seizures were also in the setting of cutting back on her drinking.  Therefore I do not think that she needs antiepileptic therapy at this time, especially with a normal EEG.  She is at very high risk for continued alcohol withdrawal and she has been started on scheduled chlordiazepoxide.  Neurology will continue to follow.  Roland Rack, MD [facility] [phone]  If 7pm- 7am, please page neurology on call  as listed in [place].

## 2019-04-17 ENCOUNTER — Observation Stay (HOSPITAL_COMMUNITY): Payer: Self-pay

## 2019-04-17 DIAGNOSIS — R569 Unspecified convulsions: Secondary | ICD-10-CM

## 2019-04-17 LAB — RAPID URINE DRUG SCREEN, HOSP PERFORMED
Amphetamines: NOT DETECTED
Barbiturates: NOT DETECTED
Benzodiazepines: POSITIVE — AB
Cocaine: POSITIVE — AB
Opiates: NOT DETECTED
Tetrahydrocannabinol: NOT DETECTED

## 2019-04-17 LAB — BASIC METABOLIC PANEL
Anion gap: 12 (ref 5–15)
BUN: 12 mg/dL (ref 8–23)
CO2: 24 mmol/L (ref 22–32)
Calcium: 7.4 mg/dL — ABNORMAL LOW (ref 8.9–10.3)
Chloride: 101 mmol/L (ref 98–111)
Creatinine, Ser: 1.41 mg/dL — ABNORMAL HIGH (ref 0.44–1.00)
GFR calc Af Amer: 46 mL/min — ABNORMAL LOW (ref >60)
GFR calc non Af Amer: 40 mL/min — ABNORMAL LOW (ref >60)
Glucose, Bld: 98 mg/dL (ref 70–99)
Potassium: 3.9 mmol/L (ref 3.5–5.1)
Sodium: 137 mmol/L (ref 135–145)

## 2019-04-17 LAB — URINALYSIS, ROUTINE W REFLEX MICROSCOPIC
Bilirubin Urine: NEGATIVE
Glucose, UA: NEGATIVE mg/dL
Ketones, ur: NEGATIVE mg/dL
Nitrite: NEGATIVE
Protein, ur: NEGATIVE mg/dL
Specific Gravity, Urine: 1.019 (ref 1.005–1.030)
WBC, UA: >50 WBC/hpf — ABNORMAL HIGH (ref 0–5)
pH: 5 (ref 5.0–8.0)

## 2019-04-17 LAB — CBC
HCT: 23.1 % — ABNORMAL LOW (ref 36.0–46.0)
Hemoglobin: 8 g/dL — ABNORMAL LOW (ref 12.0–15.0)
MCH: 35.1 pg — ABNORMAL HIGH (ref 26.0–34.0)
MCHC: 34.6 g/dL (ref 30.0–36.0)
MCV: 101.3 fL — ABNORMAL HIGH (ref 80.0–100.0)
Platelets: 148 10*3/uL — ABNORMAL LOW (ref 150–400)
RBC: 2.28 MIL/uL — ABNORMAL LOW (ref 3.87–5.11)
RDW: 15.9 % — ABNORMAL HIGH (ref 11.5–15.5)
WBC: 19.5 10*3/uL — ABNORMAL HIGH (ref 4.0–10.5)
nRBC: 0.1 % (ref 0.0–0.2)

## 2019-04-17 LAB — C DIFFICILE QUICK SCREEN W PCR REFLEX
C Diff antigen: NEGATIVE
C Diff interpretation: NOT DETECTED
C Diff toxin: NEGATIVE

## 2019-04-17 LAB — FOLATE RBC
Folate, Hemolysate: 193 ng/mL
Folate, RBC: 854 ng/mL (ref >498)
Hematocrit: 22.6 % — ABNORMAL LOW (ref 34.0–46.6)

## 2019-04-17 LAB — PROCALCITONIN: Procalcitonin: 0.73 ng/mL

## 2019-04-17 LAB — MAGNESIUM: Magnesium: 1.5 mg/dL — ABNORMAL LOW (ref 1.7–2.4)

## 2019-04-17 LAB — CALCIUM, IONIZED: Calcium, Ionized, Serum: 4.1 mg/dL — ABNORMAL LOW (ref 4.5–5.6)

## 2019-04-17 LAB — LACTIC ACID, PLASMA: Lactic Acid, Venous: 3.4 mmol/L (ref 0.5–1.9)

## 2019-04-17 MED ORDER — METOPROLOL TARTRATE 5 MG/5ML IV SOLN: 5.0000 mg | INTRAVENOUS | Status: DC | PRN

## 2019-04-17 MED ORDER — SODIUM CHLORIDE 0.9 % IV SOLN
1.0000 g | INTRAVENOUS | Status: DC
  Administered 2019-04-17 – 2019-04-19 (×3): 1 g via INTRAVENOUS
  Filled 2019-04-17 (×3): qty 10

## 2019-04-17 MED ORDER — MAGNESIUM SULFATE 4 GM/100ML IV SOLN
4.0000 g | Freq: Once | INTRAVENOUS | Status: AC
  Administered 2019-04-17: 4 g via INTRAVENOUS
  Filled 2019-04-17: qty 100

## 2019-04-17 MED ORDER — LOPERAMIDE HCL 2 MG PO CAPS
2.0000 mg | ORAL_CAPSULE | ORAL | Status: DC | PRN
  Administered 2019-04-18 – 2019-04-26 (×3): 2 mg via ORAL
  Filled 2019-04-17 (×3): qty 1

## 2019-04-17 MED ORDER — LACTATED RINGERS IV SOLN
INTRAVENOUS | Status: DC
  Administered 2019-04-17 – 2019-04-19 (×2): via INTRAVENOUS

## 2019-04-17 NOTE — Progress Notes (Addendum)
NEURO HOSPITALIST PROGRESS NOTE   Subjective: Patient awake, alert, oriented NAD. Endorses drinking stating she usually drinks a glass of liquor per day. An amount equal to 3-4 shots. Has not had a drink in over a week.   Exam: Vitals:   04/17/19 0349 04/17/19 0845  BP: 103/60 108/74  Pulse: 70 92  Resp: 17 19  Temp: 98.7 F (37.1 C) 99.4 F (37.4 C)  SpO2: (!) 89% 100%    Physical Exam  Constitutional: Appears well-developed and well-nourished.  Psych: Affect appropriate to situation Eyes: Normal external eye and conjunctiva. HENT: Normocephalic, no lesions, without obvious abnormality.   Musculoskeletal-no joint tenderness, deformity or swelling Cardiovascular: Normal rate and regular rhythm.  Respiratory: Effort normal, non-labored breathing saturations WNL on RA GI: Soft.  No distension. There is no tenderness.  Skin: WDI   Neuro:  Mental Status: Alert, oriented, thought content appropriate. Does not know why she is in the hospital, but says she passed out and woke up in the hospital.   Speech fluent without evidence of aphasia.  Able to follow  commands without difficulty. Cranial Nerves: II:  Visual fields grossly normal,  III,IV, VI: ptosis not present, extra-ocular motions intact bilaterally pupils equal, round, reactive to light and accommodation V,VII: smile symmetric, facial light touch sensation normal bilaterally VIII: hearing normal bilaterally IX,X: uvula rises symmetrically XI: bilateral shoulder shrug XII: midline tongue extension Motor: Right : Upper extremity   5/5  Left:     Upper extremity   5/5  Lower extremity   5/5   Lower extremity   5/5 Tone and bulk:normal tone throughout; no atrophy noted Sensory:  light touch intact throughout, bilaterally Deep Tendon Reflexes: 2+ and symmetric biceps, patella Plantars: Right: downgoing   Left: downgoing Cerebellar: No ataxia noted Gait: deferred    Medications:  Scheduled: .  chlordiazePOXIDE  25 mg Oral QID   Followed by  . chlordiazePOXIDE  25 mg Oral TID   Followed by  . [START ON 04/18/2019] chlordiazePOXIDE  25 mg Oral BH-qamhs   Followed by  . [START ON 04/19/2019] chlordiazePOXIDE  25 mg Oral Daily  . enoxaparin (LOVENOX) injection  40 mg Subcutaneous Q24H  . folic acid  1 mg Oral Daily  . magnesium oxide  400 mg Oral Daily  . multivitamin with minerals  1 tablet Oral Daily  . potassium chloride SA  20 mEq Oral Daily  . thiamine  100 mg Oral Daily   Or  . thiamine  100 mg Intravenous Daily   Continuous:  [id number] **OR** acetaminophen, chlordiazePOXIDE, LORazepam, metoprolol tartrate  Pertinent Labs/Diagnostics: UA: + UTI UDS: positive for cocaine and benzodiazepines  Dg Chest Port 1 View  Result Date: 04/17/2019 CLINICAL DATA:  Leukocytosis. EXAM: PORTABLE CHEST 1 VIEW COMPARISON:  10/15/2018 FINDINGS: The cardiomediastinal silhouette is within normal limits. No airspace consolidation, edema, pleural effusion, pneumothorax is identified. No acute osseous abnormality is seen. IMPRESSION: No active disease. Electronically Signed   By: Logan Bores M.D.   On: 04/17/2019 06:14   Ct Head Code Stroke Wo Contrast  Result Date: 04/16/2019 CLINICAL DATA:  Code stroke.  Aphasia EXAM: CT HEAD WITHOUT CONTRAST TECHNIQUE: Contiguous axial images were obtained from the base of the skull through the vertex without intravenous contrast. COMPARISON:  None. FINDINGS: Brain: There is no mass, hemorrhage or extra-axial collection. There is generalized atrophy without lobar predilection. There  is hypoattenuation of the periventricular white matter, most commonly indicating chronic ischemic microangiopathy. Vascular: No abnormal hyperdensity of the major intracranial arteries or dural venous sinuses. No intracranial atherosclerosis. Skull: The visualized skull base, calvarium and extracranial soft tissues are normal. Sinuses/Orbits: No fluid levels or advanced  mucosal thickening of the visualized paranasal sinuses. No mastoid or middle ear effusion. The orbits are normal. ASPECTS [facility] Stroke Program Early CT Score) - Ganglionic level infarction (caudate, lentiform nuclei, internal capsule, insula, M1-M3 cortex): 7 - Supraganglionic infarction (M4-M6 cortex): 3 Total score (0-10 with 10 being normal): 10 IMPRESSION: 1. No intracranial hemorrhage or other acute finding. 2. ASPECTS is 10. 3. These results were communicated to Dr. Karena Addison Aroor at 3:05 am on 04/16/2019 by text page via the [facility] messaging system. Electronically Signed   By: Ulyses Jarred M.D.   On: 04/16/2019 03:06    ASSESSMENT AND PLAN Christie Morrison is a 63 y.o. female with past medical history significant for seizures, alcohol abuse, illicit substance abuse, cirrhosis presents to the emergency department after family found her on the floor in the bathroom.  This was most likely a provoked seizure and keppra was only given as a single dose.   1. Seizure,possibly provokedin the setting of electolyte disturbances, possible alcohol withdrawal             - loaded with 1g keppra  -Can stop Keppra 500 mg BID              - CIWA protocol and consider scheduled benzos             -  Continue high dose thiamine              - seizure precautions,              - continue chlordiazepoxide  2. Hypomagnesemia/Hypokalemia             - Replete Mg and K ( per primary team as you are)   3. Neurology to sign off at this time, please call with any further concerns.   Laurey Morale, MSN, NP-C [facility] [phone]  Attending neurologist's note to follow 04/17/2019, 9:02 AM  I have seen the patient reviewed the above note.  She is much more awake today.  I confirmed that she had drain considerably less on Tuesday and had not yet had a drink on Wednesday when she had her seizure.  This coupled with her electrolyte abnormalities including think this is more likely a  provoked seizure than unprovoked and I would not favor continuing antiepileptic therapy.  I agree with taking precautions against withdrawal.  Neurology to sign off, please call if further questions or concerns.  Roland Rack, MD [facility] [phone]  If 7pm- 7am, please page neurology on call as listed in [place].

## 2019-04-17 NOTE — Progress Notes (Signed)
Pt's heart rate is 166. Pt is alert and oriented no signs and symptom of distress, MD notified

## 2019-04-17 NOTE — Evaluation (Signed)
Physical Therapy Evaluation Patient Details Name: Christie Morrison MRN: [id number] DOB: [birth year] Today's Date: 04/17/2019   History of Present Illness  63yo female presenting with seizure, confusion and disorientation. CT negative for acute CVA and code stroke ultimately cancelled. Admitted for seizure like activity in setting of severe electrolyte derangement. PMH cocaine use, ROH abuse, HLD, seizure  Clinical Impression   Patient received up with RN and RW coming out of bathroom; able to maintain static balance without B UE support this morning, but does require Min guard-MinA to maintain balance with dynamic tasks due to gait impairments as detailed below. Fairly steady when walking with RW in room, able to do so with S-min guard. Intermittent cues for problem solving and safety in dynamic scenarios. She refuses up to chair and was left in bed with all needs met, bed alarm active. Currently recommending skilled HHPT services as well as 24/7 S moving forward.     Follow Up Recommendations Home health PT;Supervision/Assistance - 24 hour;Other (comment)(if 24/7A is not available, would consider SNF)    Equipment Recommendations  Rolling walker with 5" wheels    Recommendations for Other Services       Precautions / Restrictions Precautions Precautions: Fall Restrictions Weight Bearing Restrictions: No      Mobility  Bed Mobility Overal bed mobility: Modified Independent                Transfers Overall transfer level: Needs assistance Equipment used: None Transfers: Sit to/from Stand Sit to Stand: Min guard         General transfer comment: min guard for safety due to gross unsteadiness  Ambulation/Gait Ambulation/Gait assistance: Min assist Gait Distance (Feet): 160 Feet Assistive device: None Gait Pattern/deviations: Step-through pattern;Scissoring;Trendelenburg;Drifts right/left;Narrow base of support Gait velocity: decreased   General Gait Details:  narrow BOS with intermittent scissoring pattern and R/L drift without BUE support; MinA to maintain balance, easily fatigued  Stairs            Wheelchair Mobility    Modified Rankin (Stroke Patients Only)       Balance Overall balance assessment: Needs assistance Sitting-balance support: Bilateral upper extremity supported;Feet supported Sitting balance-Leahy Scale: Normal     Standing balance support: No upper extremity supported;During functional activity Standing balance-Leahy Scale: Poor Standing balance comment: MinA to maintain balance without BUE support                             Pertinent Vitals/Pain Pain Assessment: No/denies pain    Home Living Family/patient expects to be discharged to:: Private residence Living Arrangements: Children Available Help at Discharge: Family;Friend(s);Available PRN/intermittently;Other (Comment)(states usually someone is  there with her- when one room mate goes out usually another one comes in) Type of Home: House Home Access: Level entry     Home Layout: One level Home Equipment: None Additional Comments: was working at [employer]    Prior Function Level of Independence: Architect Dominance   Dominant Hand: Right    Extremity/Trunk Assessment   Upper Extremity Assessment Upper Extremity Assessment: Generalized weakness    Lower Extremity Assessment Lower Extremity Assessment: Generalized weakness    Cervical / Trunk Assessment Cervical / Trunk Assessment: Normal  Communication   Communication: No difficulties  Cognition Arousal/Alertness: Awake/alert Behavior During Therapy: Flat affect Overall Cognitive Status: Within Functional Limits for tasks assessed  General Comments      Exercises     Assessment/Plan    PT Assessment Patient needs continued PT services  PT Problem List Decreased strength;Decreased  knowledge of use of DME;Decreased activity tolerance;Decreased safety awareness;Decreased balance;Decreased mobility;Decreased coordination       PT Treatment Interventions DME instruction;Balance training;Gait training;Neuromuscular re-education;Stair training;Functional mobility training;Patient/family education;Therapeutic activities;Therapeutic exercise    PT Goals (Current goals can be found in the Care Plan section)  Acute Rehab PT Goals Patient Stated Goal: go home, feel better PT Goal Formulation: With patient Time For Goal Achievement: 05/01/19 Potential to Achieve Goals: Good    Frequency Min 3X/week   Barriers to discharge        Co-evaluation               AM-PAC PT "6 Clicks" Mobility  Outcome Measure Help needed turning from your back to your side while in a flat bed without using bedrails?: None Help needed moving from lying on your back to sitting on the side of a flat bed without using bedrails?: None Help needed moving to and from a bed to a chair (including a wheelchair)?: A Little Help needed standing up from a chair using your arms (e.g., wheelchair or bedside chair)?: A Little Help needed to walk in hospital room?: A Little Help needed climbing 3-5 steps with a railing? : A Little 6 Click Score: 20    End of Session   Activity Tolerance: Patient tolerated treatment well Patient left: in bed;with call bell/phone within reach;with bed alarm set   PT Visit Diagnosis: Unsteadiness on feet (R26.81);Difficulty in walking, not elsewhere classified (R26.2);Other abnormalities of gait and mobility (R26.89);Muscle weakness (generalized) (M62.81)    Time: 1103-1594 PT Time Calculation (min) (ACUTE ONLY): 10 min   Charges:   PT Evaluation $PT Eval Low Complexity: 1 Low          Windell Norfolk, DPT, PN1   Supplemental Physical Therapist [place]    Pager [phone] Acute Rehab Office [phone]

## 2019-04-17 NOTE — TOC Initial Note (Signed)
Transition of Care [facility]) - Initial/Assessment Note    Patient Details  Name: Christie Morrison MRN: [id number] Date of Birth: [birth year]  Transition of Care [facility]) CM/SW Contact:    Pollie Friar, RN Phone Number: 04/17/2019, 1:37 PM  Clinical Narrative:                 CM provided patient with resources for alcohol and drug counseling.  Pt lives with roommate. She uses the bus for transportation.  Has been active with [facility] in February of this year. Clinic is closed today. CM provided information for her to schedule an appt if d/c's over the weekend.  Pt may need MATCH and transportation home.   Expected Discharge Plan: [place] Barriers to Discharge: Continued Medical Work up, Barriers Unresolved (comment), Inadequate or no insurance   Patient Goals and CMS Choice        Expected Discharge Plan and Services Expected Discharge Plan: [place]   Discharge Planning Services: CM Consult                                          Prior Living Arrangements/Services   Lives with:: Roommate Patient language and need for interpreter reviewed:: Yes(no needs) Do you feel safe going back to the place where you live?: Yes        Care giver support system in place?: No (comment)(intermittent supervision from roommate)   Criminal Activity/Legal Involvement Pertinent to Current Situation/Hospitalization: No - Comment as needed  Activities of Daily Living      Permission Sought/Granted                  Emotional Assessment Appearance:: Appears stated age Attitude/Demeanor/Rapport: Engaged Affect (typically observed): Accepting Orientation: : Oriented to Self, Oriented to Place, Oriented to  Time, Oriented to Situation   Psych Involvement: No (comment)  Admission diagnosis:  Hypocalcemia [E83.51] Hypokalemia [E87.6] Hypomagnesemia [E83.42] Prolonged Q-T interval on ECG [R94.31] Seizures ([place]) [R56.9] Patient Active  Problem List   Diagnosis Date Noted  . Seizure ([place]) 04/17/2019  . Seizures ([place]) 04/16/2019  . Hypocalcemia 04/16/2019  . Hypomagnesemia 04/16/2019  . Prolonged QT interval 04/16/2019  . Macrocytic anemia 04/16/2019  . Hyponatremia 04/16/2019  . AKI (acute kidney injury) ([place]) 04/16/2019  . Hypokalemia 10/15/2018  . Cirrhosis, alcoholic ([place])   . Acute hepatitis   . Dehydration, severe 06/08/2016  . Elevated lactic acid level 06/08/2016  . Alcohol abuse 06/08/2016  . Melena 06/08/2016  . Cocaine abuse ([place]) 06/08/2016  . Thrombocytopenia ([place]) 06/08/2016  . Acute hypernatremia 06/08/2016  . Acute alcohol intoxication ([place]) 06/08/2016   PCP:  Gildardo Pounds, NP Pharmacy:   [facility], [place] [address] [place] [address] Phone: [phone] Fax: [phone]     Social Determinants of Health (SDOH) Interventions    Readmission Risk Interventions Readmission Risk Prevention Plan 10/16/2018  Transportation Screening Complete  PCP or Specialist Appt within 3-5 Days (No Data)  Palliative Care Screening Not Applicable  Medication Review (RN Care Manager) Referral to Pharmacy  Some recent data might be hidden

## 2019-04-17 NOTE — Progress Notes (Addendum)
PROGRESS NOTE  Christie Morrison [id number] DOB: 1956/01/11 DOA: 04/16/2019 PCP: Gildardo Pounds, NP  HPI/Recap of past 24 hours: Christie Morrison a 63 y.o.Fwith hx of alcoholic cirrhosis, ongoing alcohol use, chronic hypomagnesemia and hypokalemia, and alcohol-related seizure in May of this yearwho presents withseizure.  Patient is very sleepy historian, but she relates feeling in her normal state of health until this evening. Per report from ER triage, patient was at home tonight when roommate heard her fall in the bathroom, arrived to find her posturing, incontinent of stool. EMS arrived and found her confused and disoriented.  In the ER, patient was tachycardic, blood pressure slightly elevated. Initially called "code stroke" because of aphasia, but this was subsequently canceled as it was thought to be due to postictal state.  Sodium 133, potassium 2.9, magnesium 0.5, calcium 7.6, creatinine 1.6 (up from baseline 0.8), mild transaminitis, leukocytosis, macrocytic anemia. Anion gap 25, CT head unremarkable.  She was given IV magnesium, IV calcium, IV potassium, and started on Keppra. Neurology were consulted and the hospital service was asked to admit for severe electrolyte derangements and seizure.   04/16/19: Patient was seen and examined at her bedside in the ED this morning.  Her only child, her son present in the room.  She reports seizure-like event 2 months ago.  States the seizure-like activity that lead to this admission was worse than previous and lasted about a minute. She reports regular liquor consumption. Last liquor use was yesterday. She would like some assistance with alcohol cessation.  Per her son she is not quite back to her baseline. Will continue to closely monitor. She is on CIWA protocol. IV ativan as needed for seizures.  04/17/19: Patient was seen and examined at her bedside this morning.  Tachycardic overnight and asymptomatic.  Persistent  abnormal labs this morning.  Elevated WBC 19,000, lactic acid greater than 3 and procalcitonin 0.73.  Obtained urine analysis, suggestive of pyuria/UTI.  Urine culture in process.  Patient started on Rocephin empirically for presumed UTI.  She was seen by neurology, seizures likely secondary to alcohol withdrawal.  EEG done on 04/16/2019, no seizure activity.  No seizure treatment indicated at this time.  Continue alcohol withdrawal protocol.  She is on Librium, multivitamin, folic acid and thiamine.  Assessment/Plan: Principal Problem:   Seizures ([place]) Active Problems:   Cocaine abuse (H[facility])   Cirrhosis, alcoholic ([facility])   Hypokalemia   Hypocalcemia   Hypomagnesemia   Prolonged QT interval   Macrocytic anemia   Hyponatremia   AKI (acute kidney injury) ([place])   Seizure ([place])  Seizure suspect provoked by alcohol withdrawal and electrolytes imbalance Presented with alcohol level less than 10 and significant electrolyte abnormalities Had a witnessed seizure at home after decrease amount of alcohol intake Seen by neurology, EEG no sign of seizure activity No indication for seizure treatment at this time  Sepsis secondary to presumed UTI Presented with leukocytosis with WBC of 27,000, tachycardia with UA suggestive of UTI obtained this morning 04/17/2019. Urine culture in process. Persistent leukocytosis this morning, fever curve temperature trending up. Elevated lactic acid greater than 3 Procalcitonin greater than 0.7 Started on Rocephin empirically, continue until urine culture results then will narrow down antibiotics. Started IV fluids normal saline at 75 cc/h continuously to avoid hypotension in the setting of sepsis. Continue to closely monitor vital signs Continue to closely monitor fever curve Monitor WBC and repeat CBC in the morning  Frequent loose stools with unclear etiology, POA Started  few days prior to admission C. difficile PCR and GI panel pending We will start on  Imodium PRN when infective process has been ruled out. Continue IV fluid to avoid dehydration  Concern for alcohol withdrawal Presented with alcohol level less than 10. Continue CIWA protocol On scheduled Librium, continue Continue multivitamins, thiamine and folic acid.  Polysubstance abuse including cocaine and alcohol use disorder Polysubstance abuse cessation counseled on at bedside Last cocaine use was days ago UDS positive for cocaine and benzodiazepine On CIWA protocol  CKD 3 She appears to be at her baseline creatinine 1.4 with GFR 46 Continue to avoid nephrotoxins such as NSAIDs Monitor urine output Repeat BMP in the morning.  Hypomagnesemia Presented with magnesium of 0.5 Improved magnesium 1.5 Repleted IV magnesium 4 g x1  Ambulatory dysfunction/physical debility PT OT assessed and recommended home health PT OT Continue activities with assistance and fall precautions  Transient tachycardia Personally reviewed EKG done on 04/17/2019 showed sinus tachycardia rate of 103 Suspect related to her sepsis  Prolonged QTC Reviewed twelve-lead EKG today showing QTC 520. Avoid QTC prolonging agents-monitor electrolytes and replete as indicated   DVT prophylaxis: Lovenox Code Status: Full code Family Communication:   Will follow-up with her son  Disposition Plan:  Patient is currently not appropriate for discharge due to ongoing evaluation of sepsis and treatment with IV antibiotics and IV fluids.  Patient will require at least 2 midnights for further evaluation and treatment of present condition.  Consults called: Neurology    Objective: Vitals:   04/16/19 1527 04/16/19 2315 04/17/19 0349 04/17/19 0845  BP: 122/89 113/76 103/60 108/74  Pulse:  88 70 92  Resp: 18 16 17 19   Temp: 98.4 F (36.9 C) 98.5 F (36.9 C) 98.7 F (37.1 C) 99.4 F (37.4 C)  TempSrc: Oral Oral Oral Oral  SpO2: 98% 100% (!) 89% 100%  Weight: 59.3 kg     Height: 5\' 10"  (1.778 m)        Intake/Output Summary (Last 24 hours) at 04/17/2019 1119 Last data filed at 04/16/2019 1744 Gross per 24 hour  Intake 553.33 ml  Output --  Net 553.33 ml   Filed Weights   04/16/19 1527  Weight: 59.3 kg    Exam:   General: 63 y.o. year-old female well developed well nourished in no acute distress.  Alert and oriented x3.  Cardiovascular: Regular rate and rhythm with no rubs or gallops.  No thyromegaly or JVD noted.    Respiratory: Clear to auscultation with no wheezes or rales. Good inspiratory effort.  Abdomen: Soft nontender nondistended with normal bowel sounds x4 quadrants.  Musculoskeletal: No lower extremity edema. 2/4 pulses in all 4 extremities.  Psychiatry: Mood is appropriate for condition and setting   Data Reviewed: CBC: Recent Labs  Lab 04/16/19 0252 04/16/19 0255 04/17/19 0324  WBC 27.9*  --  19.5*  NEUTROABS 21.8*  --   --   HGB 9.3* 11.6* 8.0*  HCT 26.9* 34.0* 23.1*  MCV 101.1*  --  101.3*  PLT 165  --  123456*   Basic Metabolic Panel: Recent Labs  Lab 04/16/19 0252 04/16/19 0255 04/16/19 0334 04/16/19 1159 04/17/19 0324  NA 133* 131*  --  135 137  K 2.9* 3.2*  --  4.2 3.9  CL 86* 88*  --  93* 101  CO2 22  --   --  27 24  GLUCOSE 143* 143*  --  96 98  BUN 9 10  --  9 12  CREATININE 1.62* 1.30*  --  1.46* 1.41*  CALCIUM 7.6*  --   --  8.0* 7.4*  MG  --   --  0.5* 1.1* 1.5*  PHOS  --   --   --  1.6*  --    GFR: Estimated Creatinine Clearance: 38.7 mL/min (A) (by C-G formula based on SCr of 1.41 mg/dL (H)). Liver Function Tests: Recent Labs  Lab 04/16/19 0252 04/16/19 1159  AST 76*  --   ALT 19  --   ALKPHOS 148*  --   BILITOT 1.2  --   PROT 7.7  --   ALBUMIN 3.9 3.4*   No results for input(s): LIPASE, AMYLASE in the last 168 hours. No results for input(s): AMMONIA in the last 168 hours. Coagulation Profile: Recent Labs  Lab 04/16/19 0252  INR 1.2   Cardiac Enzymes: No results for input(s): CKTOTAL, CKMB, CKMBINDEX,  TROPONINI in the last 168 hours. BNP (last 3 results) No results for input(s): PROBNP in the last 8760 hours. HbA1C: No results for input(s): HGBA1C in the last 72 hours. CBG: Recent Labs  Lab 04/16/19 0250  GLUCAP 146*   Lipid Profile: No results for input(s): CHOL, HDL, LDLCALC, TRIG, CHOLHDL, LDLDIRECT in the last 72 hours. Thyroid Function Tests: Recent Labs    04/16/19 1159  TSH 2.706   Anemia Panel: Recent Labs    04/16/19 1159  VITAMINB12 309   Urine analysis:    Component Value Date/Time   COLORURINE AMBER (A) 04/17/2019 0804   APPEARANCEUR HAZY (A) 04/17/2019 0804   LABSPEC 1.019 04/17/2019 0804   PHURINE 5.0 04/17/2019 0804   GLUCOSEU NEGATIVE 04/17/2019 0804   HGBUR SMALL (A) 04/17/2019 0804   BILIRUBINUR NEGATIVE 04/17/2019 0804   KETONESUR NEGATIVE 04/17/2019 0804   PROTEINUR NEGATIVE 04/17/2019 0804   NITRITE NEGATIVE 04/17/2019 0804   LEUKOCYTESUR LARGE (A) 04/17/2019 0804   Sepsis Labs: @LABRCNTIP (procalcitonin:4,lacticidven:4)  ) Recent Results (from the past 240 hour(s))  SARS CORONAVIRUS 2 (TAT 6-24 HRS) Nasopharyngeal Nasopharyngeal Swab     Status: None   Collection Time: 04/16/19  5:40 AM   Specimen: Nasopharyngeal Swab  Result Value Ref Range Status   SARS Coronavirus 2 NEGATIVE NEGATIVE Final    Comment: (NOTE) SARS-CoV-2 target nucleic acids are NOT DETECTED. The SARS-CoV-2 RNA is generally detectable in upper and lower respiratory specimens during the acute phase of infection. Negative results do not preclude SARS-CoV-2 infection, do not rule out co-infections with other pathogens, and should not be used as the sole basis for treatment or other patient management decisions. Negative results must be combined with clinical observations, patient history, and epidemiological information. The expected result is Negative. Fact Sheet for Patients: SugarRoll.be Fact Sheet for Healthcare  Providers: https://www.woods-mathews.com/ This test is not yet approved or cleared by the [place] FDA and  has been authorized for detection and/or diagnosis of SARS-CoV-2 by FDA under an Emergency Use Authorization (EUA). This EUA will remain  in effect (meaning this test can be used) for the duration of the COVID-19 declaration under Section 56 4(b)(1) of the Act, 21 U.S.C. section 360bbb-3(b)(1), unless the authorization is terminated or revoked sooner. Performed at [facility], [place] [address], [place], [place] [address]       Studies: Dg Chest Port 1 View  Result Date: 04/17/2019 CLINICAL DATA:  Leukocytosis. EXAM: PORTABLE CHEST 1 VIEW COMPARISON:  10/15/2018 FINDINGS: The cardiomediastinal silhouette is within normal limits. No airspace consolidation, edema, pleural effusion, pneumothorax is identified. No  acute osseous abnormality is seen. IMPRESSION: No active disease. Electronically Signed   By: Logan Bores M.D.   On: 04/17/2019 06:14    Scheduled Meds:  chlordiazePOXIDE  25 mg Oral TID   Followed by   Derrill Memo ON 04/18/2019] chlordiazePOXIDE  25 mg Oral BH-qamhs   Followed by   Derrill Memo ON 04/19/2019] chlordiazePOXIDE  25 mg Oral Daily   enoxaparin (LOVENOX) injection  40 mg Subcutaneous [id number]   folic acid  1 mg Oral Daily   magnesium oxide  400 mg Oral Daily   multivitamin with minerals  1 tablet Oral Daily   potassium chloride SA  20 mEq Oral Daily   thiamine  100 mg Oral Daily   Or   thiamine  100 mg Intravenous Daily    Continuous Infusions:  cefTRIAXone (ROCEPHIN)  IV 1 g (04/17/19 1043)   lactated ringers       LOS: 0 days     Kayleen Memos, MD [facility] Pager [phone]  If 7PM-7AM, please contact night-coverage [email or web address] Password [email or web address] 04/17/2019, 11:19 AM

## 2019-04-18 ENCOUNTER — Other Ambulatory Visit: Payer: Self-pay

## 2019-04-18 MED ORDER — SACCHAROMYCES BOULARDII 250 MG PO CAPS
250.0000 mg | ORAL_CAPSULE | Freq: Two times a day (BID) | ORAL | Status: DC
  Administered 2019-04-18 – 2019-04-24 (×11): 250 mg via ORAL
  Filled 2019-04-18 (×13): qty 1

## 2019-04-18 MED ORDER — LOPERAMIDE HCL 2 MG PO CAPS
2.0000 mg | ORAL_CAPSULE | Freq: Two times a day (BID) | ORAL | Status: AC
  Administered 2019-04-18 – 2019-04-21 (×6): 2 mg via ORAL
  Filled 2019-04-18 (×7): qty 1

## 2019-04-18 MED ORDER — PNEUMOCOCCAL VAC POLYVALENT 25 MCG/0.5ML IJ INJ
0.5000 mL | INJECTION | INTRAMUSCULAR | Status: AC
  Administered 2019-04-19: 0.5 mL via INTRAMUSCULAR
  Filled 2019-04-18: qty 0.5

## 2019-04-18 MED ORDER — INFLUENZA VAC SPLIT QUAD 0.5 ML IM SUSY
0.5000 mL | PREFILLED_SYRINGE | INTRAMUSCULAR | Status: AC
  Administered 2019-04-19: 0.5 mL via INTRAMUSCULAR
  Filled 2019-04-18: qty 0.5

## 2019-04-18 NOTE — Evaluation (Signed)
Occupational Therapy Evaluation Patient Details Name: Christie Morrison MRN: [id number] DOB: Nov 10, 1955 Today's Date: 04/18/2019    History of Present Illness 63yo female presenting with seizure, confusion and disorientation. CT negative for acute CVA and code stroke ultimately cancelled. Admitted for seizure like activity in setting of severe electrolyte derangement. PMH cocaine use, ROH abuse, HLD, seizure   Clinical Impression   Pt admitted with above. She demonstrates the below listed deficits and will benefit from continued OT to maximize safety and independence with BADLs.  Pt presents to OT with generalized weakness, impaired balance, and questionably impaired cognition (no family present to provide input).  Pt is unsteady and is at risk for falls.   Recommend she have 24 hour assist initially upon discharge.  Will follow acutely.       Follow Up Recommendations  Home health OT;Supervision/Assistance - 24 hour    Equipment Recommendations  Tub/shower seat    Recommendations for Other Services       Precautions / Restrictions Precautions Precautions: Fall Restrictions Weight Bearing Restrictions: No      Mobility Bed Mobility Overal bed mobility: Modified Independent                Transfers Overall transfer level: Needs assistance Equipment used: None Transfers: Sit to/from Stand;Stand Pivot Transfers Sit to Stand: Min guard Stand pivot transfers: Min guard       General transfer comment: min guard assist for safety and balance     Balance Overall balance assessment: Needs assistance Sitting-balance support: Bilateral upper extremity supported;Feet supported Sitting balance-Leahy Scale: Normal     Standing balance support: No upper extremity supported;During functional activity Standing balance-Leahy Scale: Fair Standing balance comment: able to maintain static standing with min guard assist                            ADL either  performed or assessed with clinical judgement   ADL Overall ADL's : Needs assistance/impaired Eating/Feeding: Independent   Grooming: Wash/dry hands;Wash/dry face;Oral care;Brushing hair;Min guard;Standing   Upper Body Bathing: Set up;Sitting   Lower Body Bathing: Min guard;Sit to/from stand   Upper Body Dressing : Set up;Sitting   Lower Body Dressing: Min guard;Sit to/from stand   Toilet Transfer: Min guard;Ambulation;Comfort height toilet   Toileting- Clothing Manipulation and Hygiene: Min guard;Sit to/from stand       Functional mobility during ADLs: Min guard General ADL Comments: requires min guard assist for balance      Vision Baseline Vision/History: No visual deficits Patient Visual Report: No change from baseline Vision Assessment?: No apparent visual deficits     Perception     Praxis      Pertinent Vitals/Pain Pain Assessment: No/denies pain     Hand Dominance Right   Extremity/Trunk Assessment Upper Extremity Assessment Upper Extremity Assessment: Generalized weakness   Lower Extremity Assessment Lower Extremity Assessment: Generalized weakness   Cervical / Trunk Assessment Cervical / Trunk Assessment: Normal   Communication Communication Communication: No difficulties   Cognition Arousal/Alertness: Awake/alert Behavior During Therapy: Flat affect Overall Cognitive Status: No family/caregiver present to determine baseline cognitive functioning                                 General Comments: Pt is slow to process info, and demonstrates decreased anticipatory awareness of deficits.  Requires min cues for problem solving    General Comments  discussed safety concerns with pt re: need for supervision, fall risk with public transportation, and with working - she finally acknowleged those concerns, but states "I have to work.  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        OT Problem List: Decreased activity tolerance;Impaired balance (sitting and/or standing);Decreased cognition;Decreased safety awareness;Decreased knowledge of use of DME or AE      OT Treatment/Interventions: Self-care/ADL training;Neuromuscular education;DME and/or AE instruction;Therapeutic activities;Cognitive remediation/compensation;Patient/family education;Balance training    OT Goals(Current goals can be found in the care plan section) Acute Rehab OT Goals Patient Stated Goal: to go back to work  OT Goal Formulation: With patient Time For Goal Achievement: 05/02/19 Potential to Achieve Goals: Good ADL Goals Pt Will Perform Grooming: with modified independence;standing Pt Will Perform Upper Body Bathing: with modified independence;sitting Pt Will Perform Lower Body Bathing: with modified independence;sit to/from stand Pt Will Perform Upper Body Dressing: with modified independence;sitting Pt Will Perform Lower Body Dressing: with modified independence;sit to/from stand Pt Will Transfer to Toilet: with modified independence;ambulating;regular height toilet;grab bars Pt Will Perform Toileting - Clothing Manipulation and hygiene: with modified independence;sit to/from stand Pt Will Perform Tub/Shower Transfer: Tub transfer;with min guard assist;ambulating;shower seat  OT Frequency: Min 2X/week   Barriers to D/C:             Co-evaluation              AM-PAC OT "6 Clicks" Daily Activity     Outcome Measure Help from another person eating meals?: None Help from another person taking care of personal grooming?: A Little Help from another person toileting, which includes using toliet, bedpan, or urinal?: A Little Help from another person bathing (including washing, rinsing, drying)?: A Little Help from another person to put on and taking off regular upper body clothing?: A Little Help from another person to put on and taking off regular lower body clothing?: A Little 6 Click Score: 19   End of Session Nurse Communication: Mobility status  Activity Tolerance: Patient tolerated treatment well Patient left: in bed;with call bell/phone within reach;with bed alarm set  OT Visit Diagnosis: Unsteadiness on feet (R26.81);Cognitive communication deficit (R41.841)                Time: [id number] OT Time Calculation (min): 11 min Charges:  OT General Charges $OT Visit: 1 Visit OT Evaluation $OT Eval Moderate Complexity: 1 Mod  Lucille Passy, OTR/L Acute Rehabilitation Services Pager [phone] Office [phone]   Lucille Passy M 04/18/2019, 12:20 PM

## 2019-04-18 NOTE — Plan of Care (Signed)
  Problem: Education: Goal: Knowledge of General Education information will improve Description: Including pain rating scale, medication(s)/side effects and non-pharmacologic comfort measures Outcome: Progressing   Problem: Health Behavior/Discharge Planning: Goal: Ability to manage health-related needs will improve Outcome: Progressing   Problem: Pain Managment: Goal: General experience of comfort will improve Outcome: Progressing   Problem: Safety: Goal: Ability to remain free from injury will improve Outcome: Progressing   Problem: Skin Integrity: Goal: Risk for impaired skin integrity will decrease Outcome: Progressing   Problem: Education: Goal: Expressions of having a comfortable level of knowledge regarding the disease process will increase Outcome: Progressing   Problem: Coping: Goal: Ability to adjust to condition or change in health will improve Outcome: Progressing Goal: Ability to identify appropriate support needs will improve Outcome: Progressing   Problem: Health Behavior/Discharge Planning: Goal: Compliance with prescribed medication regimen will improve Outcome: Progressing   Problem: Medication: Goal: Risk for medication side effects will decrease Outcome: Progressing   Problem: Clinical Measurements: Goal: Complications related to the disease process, condition or treatment will be avoided or minimized Outcome: Progressing Goal: Diagnostic test results will improve Outcome: Progressing   Problem: Safety: Goal: Verbalization of understanding the information provided will improve Outcome: Progressing   Problem: Self-Concept: Goal: Level of anxiety will decrease Outcome: Progressing Goal: Ability to verbalize feelings about condition will improve Outcome: Progressing   Ival Bible, BSN, RN

## 2019-04-18 NOTE — Progress Notes (Signed)
PROGRESS NOTE  Christie Morrison [id number] DOB: 1956-02-28 DOA: 04/16/2019 PCP: Gildardo Pounds, NP  HPI/Recap of past 24 hours: Christie Morrison a 63 y.o.Fwith hx of alcoholic cirrhosis, ongoing alcohol use, chronic hypomagnesemia and hypokalemia, and alcohol-related seizure in May of this yearwho presents withseizure.  Patient is very sleepy historian, but she relates feeling in her normal state of health until this evening. Per report from ER triage, patient was at home tonight when roommate heard her fall in the bathroom, arrived to find her posturing, incontinent of stool. EMS arrived and found her confused and disoriented.  In the ER, patient was tachycardic, blood pressure slightly elevated. Initially called "code stroke" because of aphasia, but this was subsequently canceled as it was thought to be due to postictal state.  Sodium 133, potassium 2.9, magnesium 0.5, calcium 7.6, creatinine 1.6 (up from baseline 0.8), mild transaminitis, leukocytosis, macrocytic anemia. Anion gap 25, CT head unremarkable.  She was given IV magnesium, IV calcium, IV potassium, and started on Keppra. Neurology were consulted and the hospital service was asked to admit for severe electrolyte derangements and seizure.   04/16/19: Patient was seen and examined at her bedside in the ED this morning.  Her only child, her son present in the room.  She reports seizure-like event 2 months ago.  States the seizure-like activity that lead to this admission was worse than previous and lasted about a minute. She reports regular liquor consumption. Last liquor use was yesterday. She would like some assistance with alcohol cessation.  Per her son she is not quite back to her baseline. Will continue to closely monitor. She is on CIWA protocol. IV ativan as needed for seizures.  04/17/19: Patient was seen and examined at her bedside this morning.  Tachycardic overnight and asymptomatic.  Persistent  abnormal labs this morning.  Elevated WBC 19,000, lactic acid greater than 3 and procalcitonin 0.73.  Obtained urine analysis, suggestive of pyuria/UTI.  Urine culture in process.  Patient started on Rocephin empirically for presumed UTI.  She was seen by neurology, seizures likely secondary to alcohol withdrawal.  EEG done on 04/16/2019, no seizure activity.  No seizure treatment indicated at this time.  Continue alcohol withdrawal protocol.  She is on Librium, multivitamin, folic acid and thiamine.  04/18/19: Seen and examined at bedside this morning.  Continues to have diarrhea.  C. difficile PCR negative.  GI panel in process.  Diarrhea could be secondary to cocaine withdrawal.  Will start on Imodium twice daily and probiotics twice daily.  Assessment/Plan: Principal Problem:   Seizures ([place]) Active Problems:   Cocaine abuse ([facility])   Cirrhosis, alcoholic ([facility])   Hypokalemia   Hypocalcemia   Hypomagnesemia   Prolonged QT interval   Macrocytic anemia   Hyponatremia   AKI (acute kidney injury) ([place])   Seizure ([place])  Seizure suspect provoked by alcohol withdrawal and electrolytes imbalance Presented with alcohol level less than 10 and significant electrolyte abnormalities Had a witnessed seizure at home after decrease amount of alcohol intake Seen by neurology, EEG no sign of seizure activity No indication for seizure treatment at this time  Sepsis secondary to klebsiella pneumoniae UTI Presented with leukocytosis with WBC of 27,000, tachycardia with UA suggestive of UTI obtained this morning 04/17/2019. Urine culture taken on 04/17/2019 grew greater than 100,000 colonies of Klebsiella pneumoniae.  Sensitivities pending. Continue Rocephin empirically until we obtain the results of sensitivities. Blood cultures no growth x1 day. Continue to follow cultures  Diarrhea with  unclear etiology, POA C. difficile PCR negative GI panel pending Start Florastor probiotic twice daily and  Imodium 2 mg twice daily x 3 days  Concern for alcohol withdrawal Presented with alcohol level less than 10. Continue CIWA protocol On scheduled Librium, continue Continue multivitamins, thiamine and folic acid.  Polysubstance abuse including cocaine and alcohol use disorder Polysubstance abuse cessation counseled on at bedside Last cocaine use was days ago UDS positive for cocaine and benzodiazepine On CIWA protocol  CKD 3 She appears to be at her baseline creatinine 1.4 with GFR 46 Continue to avoid nephrotoxins such as NSAIDs Monitor urine output Repeat BMP in the morning.  Hypomagnesemia Presented with magnesium of 0.5 Improved magnesium 1.5 Repleted IV magnesium 4 g x1  Ambulatory dysfunction/physical debility PT OT assessed and recommended home health PT OT Continue activities with assistance and fall precautions  Transient tachycardia Personally reviewed EKG done on 04/17/2019 showed sinus tachycardia rate of 103 Suspect related to her sepsis  Prolonged QTC Reviewed twelve-lead EKG today showing QTC 520. Avoid QTC prolonging agents-monitor electrolytes and replete as indicated   DVT prophylaxis: Lovenox subcu daily Code Status: Full code Family Communication:   Will follow-up with her son  Disposition Plan:  Patient is currently not appropriate for discharge due to ongoing evaluation of sepsis and treatment with IV antibiotics and IV fluids.  Patient will require at least 2 midnights for further evaluation and treatment of present condition.  Consults called: Neurology    Objective: Vitals:   04/18/19 0400 04/18/19 0749 04/18/19 1157 04/18/19 1538  BP: (!) 125/92 (!) 153/96 126/81 113/68  Pulse: 91 90 91 99  Resp: 16   18  Temp: 97.9 F (36.6 C) 98.4 F (36.9 C) 99.8 F (37.7 C) 100 F (37.8 C)  TempSrc: Oral Oral Oral Oral  SpO2: 100% 100% 100% 100%  Weight:      Height:        Intake/Output Summary (Last 24 hours) at 04/18/2019 1541 Last data  filed at 04/18/2019 [id number] Gross per 24 hour  Intake 275 ml  Output 200 ml  Net 75 ml   Filed Weights   04/16/19 1527  Weight: 59.3 kg    Exam:  . General: 63 y.o. year-old female Frail  in no acute distress.  Alert and oriented 3.   . Cardiovascular: Regular rate and rhythm no rub or gallops.  Respiratory: Clear to auscultation no wheezes or rales.  .  Abdomen: Normal bowel sounds present.  Nontender.   . Musculoskeletal: No lower extremity edema.   Marland Kitchen Psychiatry: Mood is appropriate condition and setting.   Data Reviewed: CBC: Recent Labs  Lab 04/16/19 0252 04/16/19 0255 04/16/19 1201 04/17/19 0324  WBC 27.9*  --   --  19.5*  NEUTROABS 21.8*  --   --   --   HGB 9.3* 11.6*  --  8.0*  HCT 26.9* 34.0* 22.6* 23.1*  MCV 101.1*  --   --  101.3*  PLT 165  --   --  123456*   Basic Metabolic Panel: Recent Labs  Lab 04/16/19 0252 04/16/19 0255 04/16/19 0334 04/16/19 1159 04/17/19 0324  NA 133* 131*  --  135 137  K 2.9* 3.2*  --  4.2 3.9  CL 86* 88*  --  93* 101  CO2 22  --   --  27 24  GLUCOSE 143* 143*  --  96 98  BUN 9 10  --  9 12  CREATININE 1.62* 1.30*  --  1.46* 1.41*  CALCIUM 7.6*  --   --  8.0* 7.4*  MG  --   --  0.5* 1.1* 1.5*  PHOS  --   --   --  1.6*  --    GFR: Estimated Creatinine Clearance: 38.7 mL/min (A) (by C-G formula based on SCr of 1.41 mg/dL (H)). Liver Function Tests: Recent Labs  Lab 04/16/19 0252 04/16/19 1159  AST 76*  --   ALT 19  --   ALKPHOS 148*  --   BILITOT 1.2  --   PROT 7.7  --   ALBUMIN 3.9 3.4*   No results for input(s): LIPASE, AMYLASE in the last 168 hours. No results for input(s): AMMONIA in the last 168 hours. Coagulation Profile: Recent Labs  Lab 04/16/19 0252  INR 1.2   Cardiac Enzymes: No results for input(s): CKTOTAL, CKMB, CKMBINDEX, TROPONINI in the last 168 hours. BNP (last 3 results) No results for input(s): PROBNP in the last 8760 hours. HbA1C: No results for input(s): HGBA1C in the last 72 hours. CBG:  Recent Labs  Lab 04/16/19 0250  GLUCAP 146*   Lipid Profile: No results for input(s): CHOL, HDL, LDLCALC, TRIG, CHOLHDL, LDLDIRECT in the last 72 hours. Thyroid Function Tests: Recent Labs    04/16/19 1159  TSH 2.706   Anemia Panel: Recent Labs    04/16/19 1159  VITAMINB12 309   Urine analysis:    Component Value Date/Time   COLORURINE AMBER (A) 04/17/2019 0804   APPEARANCEUR HAZY (A) 04/17/2019 0804   LABSPEC 1.019 04/17/2019 0804   PHURINE 5.0 04/17/2019 0804   GLUCOSEU NEGATIVE 04/17/2019 0804   HGBUR SMALL (A) 04/17/2019 0804   BILIRUBINUR NEGATIVE 04/17/2019 0804   KETONESUR NEGATIVE 04/17/2019 0804   PROTEINUR NEGATIVE 04/17/2019 0804   NITRITE NEGATIVE 04/17/2019 0804   LEUKOCYTESUR LARGE (A) 04/17/2019 0804   Sepsis Labs: @LABRCNTIP (procalcitonin:4,lacticidven:4)  ) Recent Results (from the past 240 hour(s))  SARS CORONAVIRUS 2 (TAT 6-24 HRS) Nasopharyngeal Nasopharyngeal Swab     Status: None   Collection Time: 04/16/19  5:40 AM   Specimen: Nasopharyngeal Swab  Result Value Ref Range Status   SARS Coronavirus 2 NEGATIVE NEGATIVE Final    Comment: (NOTE) SARS-CoV-2 target nucleic acids are NOT DETECTED. The SARS-CoV-2 RNA is generally detectable in upper and lower respiratory specimens during the acute phase of infection. Negative results do not preclude SARS-CoV-2 infection, do not rule out co-infections with other pathogens, and should not be used as the sole basis for treatment or other patient management decisions. Negative results must be combined with clinical observations, patient history, and epidemiological information. The expected result is Negative. Fact Sheet for Patients: SugarRoll.be Fact Sheet for Healthcare Providers: https://www.woods-mathews.com/ This test is not yet approved or cleared by the [place] FDA and  has been authorized for detection and/or diagnosis of SARS-CoV-2 by FDA  under an Emergency Use Authorization (EUA). This EUA will remain  in effect (meaning this test can be used) for the duration of the COVID-19 declaration under Section 56 4(b)(1) of the Act, 21 U.S.C. section 360bbb-3(b)(1), unless the authorization is terminated or revoked sooner. Performed at [facility], [place] [address], [place], [place] [address]   Culture, Urine     Status: Abnormal (Preliminary result)   Collection Time: 04/17/19  5:51 AM   Specimen: Urine, Clean Catch  Result Value Ref Range Status   Specimen Description URINE, CLEAN CATCH  Final   Special Requests   Final    Normal  Performed at [facility], [place] [address], [place], [place] [address]    Culture >=100,000 COLONIES/mL KLEBSIELLA PNEUMONIAE (A)  Final   Report Status PENDING  Incomplete  Culture, blood (routine x 2)     Status: None (Preliminary result)   Collection Time: 04/17/19  6:20 AM   Specimen: BLOOD  Result Value Ref Range Status   Specimen Description BLOOD RIGHT ARM  Final   Special Requests   Final    BOTTLES DRAWN AEROBIC AND ANAEROBIC Blood Culture adequate volume   Culture   Final    NO GROWTH 1 DAY Performed at [facility], [place] [address], [place], [place] [address]    Report Status PENDING  Incomplete  Culture, blood (routine x 2)     Status: None (Preliminary result)   Collection Time: 04/17/19  6:20 AM   Specimen: BLOOD  Result Value Ref Range Status   Specimen Description BLOOD RIGHT ARM  Final   Special Requests   Final    BOTTLES DRAWN AEROBIC ONLY Blood Culture adequate volume   Culture   Final    NO GROWTH 1 DAY Performed at [facility], [place] [address], [place], [place] [address]    Report Status PENDING  Incomplete  C difficile quick scan w PCR reflex     Status: None   Collection Time: 04/17/19 12:26 PM   Specimen: Rectum; Stool  Result Value Ref Range Status   C Diff antigen NEGATIVE NEGATIVE Final   C Diff toxin NEGATIVE NEGATIVE Final   C Diff  interpretation No C. difficile detected.  Final    Comment: Performed at [facility], [place] [address], [place], [place] [address]      Studies: No results found.  Scheduled Meds: . chlordiazePOXIDE  25 mg Oral BH-qamhs   Followed by  . [START ON 04/19/2019] chlordiazePOXIDE  25 mg Oral Daily  . enoxaparin (LOVENOX) injection  40 mg Subcutaneous Q24H  . folic acid  1 mg Oral Daily  . magnesium oxide  400 mg Oral Daily  . multivitamin with minerals  1 tablet Oral Daily  . potassium chloride SA  20 mEq Oral Daily  . thiamine  100 mg Oral Daily   Or  . thiamine  100 mg Intravenous Daily    Continuous Infusions: . cefTRIAXone (ROCEPHIN)  IV 1 g (04/18/19 0852)  . lactated ringers 75 mL/hr at 04/17/19 1524     LOS: 1 day     Kayleen Memos, MD [facility] Pager [phone]  If 7PM-7AM, please contact night-coverage [email or web address] Password [facility] 04/18/2019, 3:41 PM

## 2019-04-19 LAB — URINE CULTURE
Culture: >=100000 — AB
Special Requests: NORMAL

## 2019-04-19 MED ORDER — CEPHALEXIN 500 MG PO CAPS
500.0000 mg | ORAL_CAPSULE | Freq: Three times a day (TID) | ORAL | Status: DC
  Administered 2019-04-19 – 2019-04-20 (×3): 500 mg via ORAL
  Filled 2019-04-19 (×3): qty 1

## 2019-04-19 NOTE — Progress Notes (Addendum)
PROGRESS NOTE  Christie Morrison [id number] DOB: August 15, 1955 DOA: 04/16/2019 PCP: Gildardo Pounds, NP  HPI/Recap of past 24 hours: Christie Morrison a 63 y.o.Fwith hx of alcoholic cirrhosis, ongoing alcohol use, chronic hypomagnesemia and hypokalemia, and alcohol-related seizure in May of this yearwho presents withseizure.  Patient is very sleepy historian, but she relates feeling in her normal state of health until this evening. Per report from ER triage, patient was at home tonight when roommate heard her fall in the bathroom, arrived to find her posturing, incontinent of stool. EMS arrived and found her confused and disoriented.  In the ER, patient was tachycardic, blood pressure slightly elevated. Initially called "code stroke" because of aphasia, but this was subsequently canceled as it was thought to be due to postictal state.  Sodium 133, potassium 2.9, magnesium 0.5, calcium 7.6, creatinine 1.6 (up from baseline 0.8), mild transaminitis, leukocytosis, macrocytic anemia. Anion gap 25, CT head unremarkable.  She was given IV magnesium, IV calcium, IV potassium, and started on Keppra. Neurology were consulted and the hospital service was asked to admit for severe electrolyte derangements and seizure.   04/16/19: Patient was seen and examined at her bedside in the ED this morning.  Her only child, her son present in the room.  She reports seizure-like event 2 months ago.  States the seizure-like activity that lead to this admission was worse than previous and lasted about a minute. She reports regular liquor consumption. Last liquor use was yesterday. She would like some assistance with alcohol cessation.  Per her son she is not quite back to her baseline. Will continue to closely monitor. She is on CIWA protocol. IV ativan as needed for seizures.  04/17/19: Patient was seen and examined at her bedside this morning.  Tachycardic overnight and asymptomatic.  Persistent  abnormal labs this morning.  Elevated WBC 19,000, lactic acid greater than 3 and procalcitonin 0.73.  Obtained urine analysis, suggestive of pyuria/UTI.  Urine culture in process.  Patient started on Rocephin empirically for presumed UTI.  She was seen by neurology, seizures likely secondary to alcohol withdrawal.  EEG done on 04/16/2019, no seizure activity.  No seizure treatment indicated at this time.  Continue alcohol withdrawal protocol.  She is on Librium, multivitamin, folic acid and thiamine.  04/18/19: Seen and examined at bedside this morning.  Continues to have diarrhea.  C. difficile PCR negative.  GI panel in process.  Diarrhea could be secondary to cocaine withdrawal.  Will start on Imodium twice daily and probiotics twice daily.  04/19/19: Patient was seen and examined at bedside this morning.  No acute events overnight.  No sign of withdrawal at this time.  Urine culture sensitivities with resistance to ampicillin and nitrofurantoin.  Started on Keflex 500 mg 3 times daily x5 days.  Diarrhea slowly improving, continue probiotic and Imodium twice daily.  Continue IV fluid to avoid dehydration.  Anticipate discharge tomorrow to home with home health services if diarrhea continues to improve.  Assessment/Plan: Principal Problem:   Seizures ([place]) Active Problems:   Cocaine abuse (H[facility])   Cirrhosis, alcoholic ([facility])   Hypokalemia   Hypocalcemia   Hypomagnesemia   Prolonged QT interval   Macrocytic anemia   Hyponatremia   AKI (acute kidney injury) ([place])   Seizure ([place])  Seizure suspect provoked by alcohol withdrawal and electrolytes imbalance Presented with alcohol level less than 10 and significant electrolyte abnormalities Had a witnessed seizure at home after decrease amount of alcohol intake Seen by neurology,  EEG no sign of seizure activity No indication for seizure treatment at this time  Sepsis secondary to klebsiella pneumoniae UTI Presented with leukocytosis with  WBC of 27,000, tachycardia with UA suggestive of UTI obtained this morning 04/17/2019. Urine culture taken on 04/17/2019 grew greater than 100,000 colonies of Klebsiella pneumoniae.  Sensitivities show resistance to nitrofurantoin and ampicillin. Completed 3 days of Rocephin Sepsis physiology is improving Switch to Keflex 500 mg 3 times daily x5 days. Blood cultures negative to date. Obtain CBC, procalcitonin and lactic acid in the morning.  Persistent diarrhea with unclear etiology, POA C. difficile PCR negative GI panel pending Diarrhea is slowly improving but persistent Continue Florastor probiotic twice daily and Imodium 2 mg twice daily x 3 days Continue IV fluid to avoid dehydration.  Alcohol withdrawal Presented with alcohol level less than 10. CIWA protocol On scheduled Librium, continue Continue multivitamins, thiamine and folic acid. No sign of alcohol withdrawal noted, of this visit. Alcohol cessation counseled on at bedside.  Polysubstance abuse including cocaine and alcohol use disorder Polysubstance abuse cessation counseled on at bedside Last cocaine use was days ago UDS positive for cocaine and benzodiazepine  CKD 3 She appears to be at her baseline creatinine 1.4 with GFR 46 Continue to avoid nephrotoxins such as NSAIDs Monitor urine output Repeat BMP in the morning.  Hypomagnesemia Presented with magnesium of 0.5 Improved magnesium 1.5 Repleted IV magnesium 4 g x1  Ambulatory dysfunction/physical debility PT OT assessed and recommended home health PT OT Continue activities with assistance and fall precautions  Transient tachycardia Personally reviewed EKG done on 04/17/2019 showed sinus tachycardia rate of 103 Suspect related to her sepsis  Prolonged QTC Reviewed twelve-lead EKG today showing QTC 520. Avoid QTC prolonging agents-monitor electrolytes and replete as indicated   DVT prophylaxis: Lovenox subcu daily Code Status: Full code Family  Communication:   Will follow-up with her son  Disposition Plan:  Possible DC to home with home health services tomorrow if diarrhea, stool frequency decreases.  Consults called: Neurology    Objective: Vitals:   04/18/19 2331 04/19/19 0357 04/19/19 0824 04/19/19 1229  BP: (!) 142/94 (!) 148/91 (!) 159/95 132/84  Pulse: 90 89 88 94  Resp: 17 19 18 18   Temp: 98.7 F (37.1 C) 98.8 F (37.1 C) 98.9 F (37.2 C) 98.8 F (37.1 C)  TempSrc: Oral Oral Oral Oral  SpO2: 100% 100% 100% 100%  Weight:      Height:        Intake/Output Summary (Last 24 hours) at 04/19/2019 1447 Last data filed at 04/19/2019 1343 Gross per 24 hour  Intake 458.56 ml  Output 900 ml  Net -441.44 ml   Filed Weights   04/16/19 1527  Weight: 59.3 kg    Exam:  . General: 63 y.o. year-old female pleasant no acute distress.  Alert and oriented x3.  .  Cardiovascular: Regular rate and rhythm no rubs or gallops.  Respiratory: Clear to Auscultation No Wheezes or Rales.  .  Abdomen: Bowel sounds present.  Nontender. . Musculoskeletal: No lower extremity edema. Marland Kitchen Psychiatry: Mood is appropriate for condition and setting..   Data Reviewed: CBC: Recent Labs  Lab 04/16/19 0252 04/16/19 0255 04/16/19 1201 04/17/19 0324  WBC 27.9*  --   --  19.5*  NEUTROABS 21.8*  --   --   --   HGB 9.3* 11.6*  --  8.0*  HCT 26.9* 34.0* 22.6* 23.1*  MCV 101.1*  --   --  101.3*  PLT 165  --   --  123456*   Basic Metabolic Panel: Recent Labs  Lab 04/16/19 0252 04/16/19 0255 04/16/19 0334 04/16/19 1159 04/17/19 0324  NA 133* 131*  --  135 137  K 2.9* 3.2*  --  4.2 3.9  CL 86* 88*  --  93* 101  CO2 22  --   --  27 24  GLUCOSE 143* 143*  --  96 98  BUN 9 10  --  9 12  CREATININE 1.62* 1.30*  --  1.46* 1.41*  CALCIUM 7.6*  --   --  8.0* 7.4*  MG  --   --  0.5* 1.1* 1.5*  PHOS  --   --   --  1.6*  --    GFR: Estimated Creatinine Clearance: 38.7 mL/min (A) (by C-G formula based on SCr of 1.41 mg/dL (H)). Liver  Function Tests: Recent Labs  Lab 04/16/19 0252 04/16/19 1159  AST 76*  --   ALT 19  --   ALKPHOS 148*  --   BILITOT 1.2  --   PROT 7.7  --   ALBUMIN 3.9 3.4*   No results for input(s): LIPASE, AMYLASE in the last 168 hours. No results for input(s): AMMONIA in the last 168 hours. Coagulation Profile: Recent Labs  Lab 04/16/19 0252  INR 1.2   Cardiac Enzymes: No results for input(s): CKTOTAL, CKMB, CKMBINDEX, TROPONINI in the last 168 hours. BNP (last 3 results) No results for input(s): PROBNP in the last 8760 hours. HbA1C: No results for input(s): HGBA1C in the last 72 hours. CBG: Recent Labs  Lab 04/16/19 0250  GLUCAP 146*   Lipid Profile: No results for input(s): CHOL, HDL, LDLCALC, TRIG, CHOLHDL, LDLDIRECT in the last 72 hours. Thyroid Function Tests: No results for input(s): TSH, T4TOTAL, FREET4, T3FREE, THYROIDAB in the last 72 hours. Anemia Panel: No results for input(s): VITAMINB12, FOLATE, FERRITIN, TIBC, IRON, RETICCTPCT in the last 72 hours. Urine analysis:    Component Value Date/Time   COLORURINE AMBER (A) 04/17/2019 0804   APPEARANCEUR HAZY (A) 04/17/2019 0804   LABSPEC 1.019 04/17/2019 0804   PHURINE 5.0 04/17/2019 0804   GLUCOSEU NEGATIVE 04/17/2019 0804   HGBUR SMALL (A) 04/17/2019 0804   BILIRUBINUR NEGATIVE 04/17/2019 0804   KETONESUR NEGATIVE 04/17/2019 0804   PROTEINUR NEGATIVE 04/17/2019 0804   NITRITE NEGATIVE 04/17/2019 0804   LEUKOCYTESUR LARGE (A) 04/17/2019 0804   Sepsis Labs: @LABRCNTIP (procalcitonin:4,lacticidven:4)  ) Recent Results (from the past 240 hour(s))  SARS CORONAVIRUS 2 (TAT 6-24 HRS) Nasopharyngeal Nasopharyngeal Swab     Status: None   Collection Time: 04/16/19  5:40 AM   Specimen: Nasopharyngeal Swab  Result Value Ref Range Status   SARS Coronavirus 2 NEGATIVE NEGATIVE Final    Comment: (NOTE) SARS-CoV-2 target nucleic acids are NOT DETECTED. The SARS-CoV-2 RNA is generally detectable in upper and lower  respiratory specimens during the acute phase of infection. Negative results do not preclude SARS-CoV-2 infection, do not rule out co-infections with other pathogens, and should not be used as the sole basis for treatment or other patient management decisions. Negative results must be combined with clinical observations, patient history, and epidemiological information. The expected result is Negative. Fact Sheet for Patients: SugarRoll.be Fact Sheet for Healthcare Providers: https://www.woods-mathews.com/ This test is not yet approved or cleared by the [place] FDA and  has been authorized for detection and/or diagnosis of SARS-CoV-2 by FDA under an Emergency Use Authorization (EUA). This EUA will remain  in effect (meaning this test can be used) for the duration  of the COVID-19 declaration under Section 56 4(b)(1) of the Act, 21 U.S.C. section 360bbb-3(b)(1), unless the authorization is terminated or revoked sooner. Performed at [facility], [place] [address] [place], [place] [address]   Culture, Urine     Status: Abnormal   Collection Time: 04/17/19  5:51 AM   Specimen: Urine, Clean Catch  Result Value Ref Range Status   Specimen Description URINE, CLEAN CATCH  Final   Special Requests   Final    Normal Performed at [facility], [place] [address] [place], [place] [address]    Culture >=100,000 COLONIES/mL KLEBSIELLA PNEUMONIAE (A)  Final   Report Status 04/19/2019 FINAL  Final   Organism ID, Bacteria KLEBSIELLA PNEUMONIAE (A)  Final      Susceptibility   Klebsiella pneumoniae - MIC*    AMPICILLIN RESISTANT Resistant     CEFAZOLIN <=4 SENSITIVE Sensitive     CEFTRIAXONE <=1 SENSITIVE Sensitive     CIPROFLOXACIN <=0.25 SENSITIVE Sensitive     GENTAMICIN <=1 SENSITIVE Sensitive     IMIPENEM <=0.25 SENSITIVE Sensitive     NITROFURANTOIN 64 INTERMEDIATE Intermediate     TRIMETH/SULFA <=20 SENSITIVE Sensitive      AMPICILLIN/SULBACTAM 4 SENSITIVE Sensitive     PIP/TAZO <=4 SENSITIVE Sensitive     Extended ESBL NEGATIVE Sensitive     * >=100,000 COLONIES/mL KLEBSIELLA PNEUMONIAE  Culture, blood (routine x 2)     Status: None (Preliminary result)   Collection Time: 04/17/19  6:20 AM   Specimen: BLOOD  Result Value Ref Range Status   Specimen Description BLOOD RIGHT ARM  Final   Special Requests   Final    BOTTLES DRAWN AEROBIC AND ANAEROBIC Blood Culture adequate volume   Culture   Final    NO GROWTH 2 DAYS Performed at [facility], [address], [place], [place] [address]    Report Status PENDING  Incomplete  Culture, blood (routine x 2)     Status: None (Preliminary result)   Collection Time: 04/17/19  6:20 AM   Specimen: BLOOD  Result Value Ref Range Status   Specimen Description BLOOD RIGHT ARM  Final   Special Requests   Final    BOTTLES DRAWN AEROBIC ONLY Blood Culture adequate volume   Culture   Final    NO GROWTH 2 DAYS Performed at [facility], [place] [address], [place], [place] [address]    Report Status PENDING  Incomplete  C difficile quick scan w PCR reflex     Status: None   Collection Time: 04/17/19 12:26 PM   Specimen: Rectum; Stool  Result Value Ref Range Status   C Diff antigen NEGATIVE NEGATIVE Final   C Diff toxin NEGATIVE NEGATIVE Final   C Diff interpretation No C. difficile detected.  Final    Comment: Performed at [facility], [place] [address], [place], [place] [address]      Studies: No results found.  Scheduled Meds: . cephALEXin  500 mg Oral Q8H  . enoxaparin (LOVENOX) injection  40 mg Subcutaneous Q24H  . folic acid  1 mg Oral Daily  . loperamide  2 mg Oral BID  . magnesium oxide  400 mg Oral Daily  . multivitamin with minerals  1 tablet Oral Daily  . potassium chloride SA  20 mEq Oral Daily  . saccharomyces boulardii  250 mg Oral BID  . thiamine  100 mg Oral Daily   Or  . thiamine  100 mg  Intravenous Daily    Continuous  Infusions: . lactated ringers 75 mL/hr at 04/17/19 1524     LOS: 2 days     Kayleen Memos, MD [facility] Pager [phone]  If 7PM-7AM, please contact night-coverage [email or web address] Password [facility] 04/19/2019, 2:47 PM

## 2019-04-19 NOTE — Progress Notes (Signed)
Occupational Therapy Treatment Patient Details Name: Christie Morrison MRN: [id number] DOB: 04-25-1956 Today's Date: 04/19/2019    History of present illness 64yo female presenting with seizure, confusion and disorientation. CT negative for acute CVA and code stroke ultimately cancelled. Admitted for seizure like activity in setting of severe electrolyte derangement. PMH cocaine use, ROH abuse, HLD, seizure   OT comments  Pt is slightly more interactive today, and while she still requires min guard assist for ADLs, does not seem as off balance today.   She did fatigue quickly today.  Will continue to follow.   Follow Up Recommendations  Home health OT;Supervision/Assistance - 24 hour    Equipment Recommendations  Tub/shower seat    Recommendations for Other Services      Precautions / Restrictions Precautions Precautions: Fall       Mobility Bed Mobility Overal bed mobility: Modified Independent                Transfers Overall transfer level: Needs assistance Equipment used: None Transfers: Sit to/from Stand;Stand Pivot Transfers Sit to Stand: Min guard Stand pivot transfers: Min guard       General transfer comment: min guard assist for safety and balance     Balance Overall balance assessment: Needs assistance Sitting-balance support: Bilateral upper extremity supported;Feet supported Sitting balance-Leahy Scale: Normal     Standing balance support: No upper extremity supported;During functional activity Standing balance-Leahy Scale: Fair Standing balance comment: able to maintain static standing with min guard assist                            ADL either performed or assessed with clinical judgement   ADL Overall ADL's : Needs assistance/impaired Eating/Feeding: Independent   Grooming: Wash/dry hands;Wash/dry face;Oral care;Brushing hair;Min guard;Standing                   Toilet Transfer: Min guard;Ambulation;Comfort height  toilet   Toileting- Clothing Manipulation and Hygiene: Sit to/from stand;Min guard       Functional mobility during ADLs: Min guard       Vision       Perception     Praxis      Cognition Arousal/Alertness: Awake/alert Behavior During Therapy: Flat affect Overall Cognitive Status: No family/caregiver present to determine baseline cognitive functioning                                 General Comments: Pt remains slow to process, but minimally more interactive today         Exercises     Shoulder Instructions       General Comments      Pertinent Vitals/ Pain       Pain Assessment: No/denies pain  Home Living                                          Prior Functioning/Environment              Frequency  Min 2X/week        Progress Toward Goals  OT Goals(current goals can now be found in the care plan section)        Plan Discharge plan remains appropriate    Co-evaluation  AM-PAC OT "6 Clicks" Daily Activity     Outcome Measure   Help from another person eating meals?: None Help from another person taking care of personal grooming?: A Little Help from another person toileting, which includes using toliet, bedpan, or urinal?: A Little Help from another person bathing (including washing, rinsing, drying)?: A Little Help from another person to put on and taking off regular upper body clothing?: A Little Help from another person to put on and taking off regular lower body clothing?: A Little 6 Click Score: 19    End of Session    OT Visit Diagnosis: Unsteadiness on feet (R26.81);Cognitive communication deficit (R41.841)   Activity Tolerance Patient limited by fatigue   Patient Left in bed;with call bell/phone within reach;with nursing/sitter in room   Nurse Communication Mobility status        Time: [id number] OT Time Calculation (min): 11 min  Charges: OT General Charges $OT  Visit: 1 Visit OT Treatments $Self Care/Home Management : 8-22 mins  Lucille Passy, OTR/L [place] Pager [phone] Office [phone]    Lucille Passy M 04/19/2019, 3:47 PM

## 2019-04-20 ENCOUNTER — Other Ambulatory Visit: Payer: Self-pay | Admitting: Nurse Practitioner

## 2019-04-20 ENCOUNTER — Inpatient Hospital Stay (HOSPITAL_COMMUNITY): Payer: Self-pay

## 2019-04-20 DIAGNOSIS — R569 Unspecified convulsions: Secondary | ICD-10-CM

## 2019-04-20 LAB — BASIC METABOLIC PANEL
Anion gap: 8 (ref 5–15)
BUN: 10 mg/dL (ref 8–23)
CO2: 25 mmol/L (ref 22–32)
Calcium: 8.5 mg/dL — ABNORMAL LOW (ref 8.9–10.3)
Chloride: 106 mmol/L (ref 98–111)
Creatinine, Ser: 1.13 mg/dL — ABNORMAL HIGH (ref 0.44–1.00)
GFR calc Af Amer: >60 mL/min (ref >60)
GFR calc non Af Amer: 52 mL/min — ABNORMAL LOW (ref >60)
Glucose, Bld: 99 mg/dL (ref 70–99)
Potassium: 3.9 mmol/L (ref 3.5–5.1)
Sodium: 139 mmol/L (ref 135–145)

## 2019-04-20 LAB — LACTIC ACID, PLASMA: Lactic Acid, Venous: 1.9 mmol/L (ref 0.5–1.9)

## 2019-04-20 LAB — CBC
HCT: 25 % — ABNORMAL LOW (ref 36.0–46.0)
Hemoglobin: 8.1 g/dL — ABNORMAL LOW (ref 12.0–15.0)
MCH: 34.9 pg — ABNORMAL HIGH (ref 26.0–34.0)
MCHC: 32.4 g/dL (ref 30.0–36.0)
MCV: 107.8 fL — ABNORMAL HIGH (ref 80.0–100.0)
Platelets: 270 10*3/uL (ref 150–400)
RBC: 2.32 MIL/uL — ABNORMAL LOW (ref 3.87–5.11)
RDW: 16.4 % — ABNORMAL HIGH (ref 11.5–15.5)
WBC: 14.4 10*3/uL — ABNORMAL HIGH (ref 4.0–10.5)
nRBC: 0 % (ref 0.0–0.2)

## 2019-04-20 LAB — MAGNESIUM: Magnesium: 1.2 mg/dL — ABNORMAL LOW (ref 1.7–2.4)

## 2019-04-20 LAB — PROCALCITONIN: Procalcitonin: 0.41 ng/mL

## 2019-04-20 MED ORDER — PIPERACILLIN-TAZOBACTAM 3.375 G IVPB
3.3750 g | Freq: Three times a day (TID) | INTRAVENOUS | Status: DC
  Administered 2019-04-20 – 2019-04-24 (×12): 3.375 g via INTRAVENOUS
  Filled 2019-04-20 (×16): qty 50

## 2019-04-20 MED ORDER — PIPERACILLIN-TAZOBACTAM 3.375 G IVPB 30 MIN
3.3750 g | Freq: Once | INTRAVENOUS | Status: AC
  Administered 2019-04-20: 3.375 g via INTRAVENOUS
  Filled 2019-04-20: qty 50

## 2019-04-20 NOTE — Progress Notes (Signed)
Pharmacy Antibiotic Note  Christie Morrison is a 63 y.o. female admitted on 04/16/2019 with sepsis.  Pharmacy has been consulted for Zosyn dosing. SCr down to 1.13.  Plan: Zosyn 3.375g IV (36min infusion) x1; then 3.375g IV q8h (4h infusion) Monitor clinical progress, c/s, renal function F/u de-escalation plan/LOT  Height: 5\' 10"  (177.8 cm) Weight: 130 lb 11.2 oz (59.3 kg) IBW/kg (Calculated) : 68.5  Temp (24hrs), Avg:99.3 F (37.4 C), Min:98 F (36.7 C), Max:100.5 F (38.1 C)  Recent Labs  Lab 04/16/19 0252 04/16/19 0255 04/16/19 1159 04/17/19 0324 04/17/19 0620 04/20/19 0359  WBC 27.9*  --   --  19.5*  --  14.4*  CREATININE 1.62* 1.30* 1.46* 1.41*  --  1.13*  LATICACIDVEN  --   --   --   --  3.4* 1.9    Estimated Creatinine Clearance: 48.3 mL/min (A) (by C-G formula based on SCr of 1.13 mg/dL (H)).    No Known Allergies  Elicia Lamp, PharmD, BCPS Please check AMION for all [place] contact numbers Clinical Pharmacist 04/20/2019 1:39 PM

## 2019-04-20 NOTE — Progress Notes (Signed)
PROGRESS NOTE  Christie Morrison [id number] DOB: June 06, 1955 DOA: 04/16/2019 PCP: Gildardo Pounds, NP  HPI/Recap of past 24 hours: Christie Morrison a 64 y.o.Fwith hx of alcoholic cirrhosis, ongoing alcohol use, chronic hypomagnesemia and hypokalemia, and alcohol-related seizure in May of this yearwho presents withseizure.  Patient is very sleepy historian, but she relates feeling in her normal state of health until this evening. Per report from ER triage, patient was at home tonight when roommate heard her fall in the bathroom, arrived to find her posturing, incontinent of stool. EMS arrived and found her confused and disoriented.  In the ER, patient was tachycardic, blood pressure slightly elevated. Initially called "code stroke" because of aphasia, but this was subsequently canceled as it was thought to be due to postictal state.  Sodium 133, potassium 2.9, magnesium 0.5, calcium 7.6, creatinine 1.6 (up from baseline 0.8), mild transaminitis, leukocytosis, macrocytic anemia. Anion gap 25, CT head unremarkable.  She was given IV magnesium, IV calcium, IV potassium, and started on Keppra. Neurology were consulted and the hospital service was asked to admit for severe electrolyte derangements and seizure.   11/26: Patient was seen and examined at her bedside in the ED this morning.  Her only child, her son present in the room.  She reports seizure-like event 2 months ago.  States the seizure-like activity that lead to this admission was worse than previous and lasted about a minute. She reports regular liquor consumption. Last liquor use was yesterday. She would like some assistance with alcohol cessation.  Per her son she is not quite back to her baseline. Will continue to closely monitor. She is on CIWA protocol. IV ativan as needed for seizures.  11/27: Elevated WBC 19,000, lactic acid greater than 3 and procalcitonin 0.73.  UTI.  Urine culture in process.  Patient  started on Rocephin empirically for presumed UTI.  Seen by neurology, seizures likely secondary to alcohol withdrawal.  EEG done on 04/16/2019, no seizure activity.  No seizure treatment indicated at this time.  Continue alcohol withdrawal protocol.  She is on Librium, multivitamin, folic acid and thiamine. 11/28: Persistent diarrhea.  C. difficile PCR negative.  GI panel in process.  Diarrhea could be secondary to cocaine withdrawal.  Will start on Imodium twice daily and probiotics twice daily. 11/29: No sign of withdrawal at this time.  Urine culture sensitivities with resistance to ampicillin and nitrofurantoin.  Started on Keflex 500 mg 3 times daily x5 days.  04/20/19: Patient was seen and examined at her bedside this morning.  She reports watery stools persist.  Initial fever with T-max of 100.5 and leukocytosis 14,000.  Obtain abdominal ultrasound and start Zosyn empirically.  Blood cultures negative to date.  Will Repeat labs in the morning and follow abdominal ultrasound results.  Assessment/Plan: Principal Problem:   Seizures ([place]) Active Problems:   Cocaine abuse ([facility])   Cirrhosis, alcoholic (HCC)   Hypokalemia   Hypocalcemia   Hypomagnesemia   Prolonged QT interval   Macrocytic anemia   Hyponatremia   AKI (acute kidney injury) ([place])   Seizure ([facility])  Seizure suspect provoked by alcohol withdrawal and electrolytes imbalance Presented with alcohol level less than 10 and significant electrolyte abnormalities Had a witnessed seizure at home after decrease amount of alcohol intake Seen by neurology, EEG no sign of seizure activity No indication for seizure treatment at this time  Sepsis secondary to klebsiella pneumoniae UTI versus other Presented with leukocytosis with WBC of 27,000, tachycardia with UA suggestive  of UTI obtained this morning 04/17/2019. Urine culture taken on 04/17/2019 grew greater than 100,000 colonies of Klebsiella pneumoniae.  Sensitivities show resistance  to nitrofurantoin and ampicillin. Completed 3 days of Rocephin Sepsis physiology is improving Switched to Keflex 500 mg 3 times daily x5 days on 04/19/19 Blood cultures negative to date. Persistent watery stool reported this morning despite treatment. Fever with T-max 100.5 this morning. Leukocytosis is persistent 14,000 although improving from 19,000 GI panel in process. Obtain abdominal ultrasound Hold off Keflex and start IV Zosyn on 04/20/2019.  Persistent diarrhea with unclear etiology, POA C. difficile PCR negative GI panel in process Watery stool persistent Continue Florastor probiotic twice daily and Imodium 2 mg twice daily x 3 days Continue IV fluid to avoid dehydration. Obtain CMP in the morning.  Alcohol withdrawal Presented with alcohol level less than 10. CIWA protocol On scheduled Librium, continue Continue multivitamins, thiamine and folic acid. No sign of alcohol withdrawal noted, of this visit. Alcohol cessation counseled on at bedside.  Polysubstance abuse including cocaine and alcohol use disorder Polysubstance abuse cessation counseled on at bedside Last cocaine use was days ago UDS positive for cocaine and benzodiazepine  AKI likely prerenal, resolving on IV fluid hydration Baseline creatinine 1.1 with GFR greater than 60 Presented with creatinine 1.4 and GFR 46 Continue to avoid nephrotoxins Continue to monitor urine output Daily BMP  Hypomagnesemia Presented with magnesium of 0.5 Improved magnesium 1.5 Repleted IV magnesium 4 g x1  Repeat magnesium level  Ambulatory dysfunction/physical debility PT OT assessed and recommended home health PT OT Continue activities with assistance and fall precautions Case manager working on home health services for DC planning  Resolved transient tachycardia Personally reviewed EKG done on 04/17/2019 showed sinus tachycardia rate of 103 Suspect related to her sepsis  Prolonged QTC Reviewed twelve-lead EKG  today showing QTC 520. Avoid QTC prolonging agents-monitor electrolytes and replete as indicated   DVT prophylaxis: Lovenox subcu daily Code Status: Full code Family Communication:   Will follow-up with her son  Disposition Plan:  Possible DC to home with home health services when diarrhea is under control and no longer febrile.  Consults called: Neurology    Objective: Vitals:   04/20/19 0016 04/20/19 0340 04/20/19 0743 04/20/19 1147  BP: 138/81 (!) 156/91 132/81 (!) 159/97  Pulse: 92 90 93 92  Resp: 18 16 16 16   Temp: (!) 100.5 F (38.1 C) 98 F (36.7 C) 99 F (37.2 C) (!) 100.5 F (38.1 C)  TempSrc: Oral Oral Oral Oral  SpO2: 100% 100% 98% 99%  Weight:      Height:        Intake/Output Summary (Last 24 hours) at 04/20/2019 1337 Last data filed at 04/20/2019 [id number] Gross per 24 hour  Intake 248 ml  Output -  Net 248 ml   Filed Weights   04/16/19 1527  Weight: 59.3 kg    Exam:  . General: 63 y.o. year-old female pleasant no acute distress.  Alert and oriented x3.  .  Cardiovascular: Regular rate and rhythm no rubs or gallops.  Respiratory: Clear to auscultation no wheezes or rales.  .  Abdomen: Hypoactive bowel sounds present.  Nontender.  Musculoskeletal: No lower extremity edema.   Psychiatry: Mood is appropriate for condition and setting.  Data Reviewed: CBC: Recent Labs  Lab 04/16/19 0252 04/16/19 0255 04/16/19 1201 04/17/19 0324 04/20/19 0359  WBC 27.9*  --   --  19.5* 14.4*  NEUTROABS 21.8*  --   --   --   --  HGB 9.3* 11.6*  --  8.0* 8.1*  HCT 26.9* 34.0* 22.6* 23.1* 25.0*  MCV 101.1*  --   --  101.3* 107.8*  PLT 165  --   --  148* [id number]   Basic Metabolic Panel: Recent Labs  Lab 04/16/19 0252 04/16/19 0255 04/16/19 0334 04/16/19 1159 04/17/19 0324 04/20/19 0359  NA 133* 131*  --  135 137 139  K 2.9* 3.2*  --  4.2 3.9 3.9  CL 86* 88*  --  93* 101 106  CO2 22  --   --  27 24 25   GLUCOSE 143* 143*  --  96 98 99  BUN 9 10  --  9 12 10    CREATININE 1.62* 1.30*  --  1.46* 1.41* 1.13*  CALCIUM 7.6*  --   --  8.0* 7.4* 8.5*  MG  --   --  0.5* 1.1* 1.5*  --   PHOS  --   --   --  1.6*  --   --    GFR: Estimated Creatinine Clearance: 48.3 mL/min (A) (by C-G formula based on SCr of 1.13 mg/dL (H)). Liver Function Tests: Recent Labs  Lab 04/16/19 0252 04/16/19 1159  AST 76*  --   ALT 19  --   ALKPHOS 148*  --   BILITOT 1.2  --   PROT 7.7  --   ALBUMIN 3.9 3.4*   No results for input(s): LIPASE, AMYLASE in the last 168 hours. No results for input(s): AMMONIA in the last 168 hours. Coagulation Profile: Recent Labs  Lab 04/16/19 0252  INR 1.2   Cardiac Enzymes: No results for input(s): CKTOTAL, CKMB, CKMBINDEX, TROPONINI in the last 168 hours. BNP (last 3 results) No results for input(s): PROBNP in the last 8760 hours. HbA1C: No results for input(s): HGBA1C in the last 72 hours. CBG: Recent Labs  Lab 04/16/19 0250  GLUCAP 146*   Lipid Profile: No results for input(s): CHOL, HDL, LDLCALC, TRIG, CHOLHDL, LDLDIRECT in the last 72 hours. Thyroid Function Tests: No results for input(s): TSH, T4TOTAL, FREET4, T3FREE, THYROIDAB in the last 72 hours. Anemia Panel: No results for input(s): VITAMINB12, FOLATE, FERRITIN, TIBC, IRON, RETICCTPCT in the last 72 hours. Urine analysis:    Component Value Date/Time   COLORURINE AMBER (A) 04/17/2019 0804   APPEARANCEUR HAZY (A) 04/17/2019 0804   LABSPEC 1.019 04/17/2019 0804   PHURINE 5.0 04/17/2019 0804   GLUCOSEU NEGATIVE 04/17/2019 0804   HGBUR SMALL (A) 04/17/2019 0804   BILIRUBINUR NEGATIVE 04/17/2019 0804   KETONESUR NEGATIVE 04/17/2019 0804   PROTEINUR NEGATIVE 04/17/2019 0804   NITRITE NEGATIVE 04/17/2019 0804   LEUKOCYTESUR LARGE (A) 04/17/2019 0804   Sepsis Labs: @LABRCNTIP (procalcitonin:4,lacticidven:4)  ) Recent Results (from the past 240 hour(s))  SARS CORONAVIRUS 2 (TAT 6-24 HRS) Nasopharyngeal Nasopharyngeal Swab     Status: None   Collection  Time: 04/16/19  5:40 AM   Specimen: Nasopharyngeal Swab  Result Value Ref Range Status   SARS Coronavirus 2 NEGATIVE NEGATIVE Final    Comment: (NOTE) SARS-CoV-2 target nucleic acids are NOT DETECTED. The SARS-CoV-2 RNA is generally detectable in upper and lower respiratory specimens during the acute phase of infection. Negative results do not preclude SARS-CoV-2 infection, do not rule out co-infections with other pathogens, and should not be used as the sole basis for treatment or other patient management decisions. Negative results must be combined with clinical observations, patient history, and epidemiological information. The expected result is Negative. Fact Sheet for Patients: SugarRoll.be Fact Sheet  for Healthcare Providers: https://www.woods-mathews.com/ This test is not yet approved or cleared by the [place] and  has been authorized for detection and/or diagnosis of SARS-CoV-2 by FDA under an Emergency Use Authorization (EUA). This EUA will remain  in effect (meaning this test can be used) for the duration of the COVID-19 declaration under Section 56 4(b)(1) of the Act, 21 U.S.C. section 360bbb-3(b)(1), unless the authorization is terminated or revoked sooner. Performed at [facility], [place] [address], [place], [place] [address]   Culture, Urine     Status: Abnormal   Collection Time: 04/17/19  5:51 AM   Specimen: Urine, Clean Catch  Result Value Ref Range Status   Specimen Description URINE, CLEAN CATCH  Final   Special Requests   Final    Normal Performed at [facility], [place] [address] [place], [place] [address]    Culture >=100,000 COLONIES/mL KLEBSIELLA PNEUMONIAE (A)  Final   Report Status 04/19/2019 FINAL  Final   Organism ID, Bacteria KLEBSIELLA PNEUMONIAE (A)  Final      Susceptibility   Klebsiella pneumoniae - MIC*    AMPICILLIN RESISTANT Resistant     CEFAZOLIN <=4 SENSITIVE Sensitive      CEFTRIAXONE <=1 SENSITIVE Sensitive     CIPROFLOXACIN <=0.25 SENSITIVE Sensitive     GENTAMICIN <=1 SENSITIVE Sensitive     IMIPENEM <=0.25 SENSITIVE Sensitive     NITROFURANTOIN 64 INTERMEDIATE Intermediate     TRIMETH/SULFA <=20 SENSITIVE Sensitive     AMPICILLIN/SULBACTAM 4 SENSITIVE Sensitive     PIP/TAZO <=4 SENSITIVE Sensitive     Extended ESBL NEGATIVE Sensitive     * >=100,000 COLONIES/mL KLEBSIELLA PNEUMONIAE  Culture, blood (routine x 2)     Status: None (Preliminary result)   Collection Time: 04/17/19  6:20 AM   Specimen: BLOOD  Result Value Ref Range Status   Specimen Description BLOOD RIGHT ARM  Final   Special Requests   Final    BOTTLES DRAWN AEROBIC AND ANAEROBIC Blood Culture adequate volume   Culture   Final    NO GROWTH 3 DAYS Performed at [facility], [address], [place], [place] [address]    Report Status PENDING  Incomplete  Culture, blood (routine x 2)     Status: None (Preliminary result)   Collection Time: 04/17/19  6:20 AM   Specimen: BLOOD  Result Value Ref Range Status   Specimen Description BLOOD RIGHT ARM  Final   Special Requests   Final    BOTTLES DRAWN AEROBIC ONLY Blood Culture adequate volume   Culture   Final    NO GROWTH 3 DAYS Performed at [facility], [place] [address] [place], [place] [address]    Report Status PENDING  Incomplete  C difficile quick scan w PCR reflex     Status: None   Collection Time: 04/17/19 12:26 PM   Specimen: Rectum; Stool  Result Value Ref Range Status   C Diff antigen NEGATIVE NEGATIVE Final   C Diff toxin NEGATIVE NEGATIVE Final   C Diff interpretation No C. difficile detected.  Final    Comment: Performed at [facility], [place] [address] [place],  [address]      Studies: No results found.  Scheduled Meds: . enoxaparin (LOVENOX) injection  40 mg Subcutaneous Q24H  . folic acid  1 mg Oral Daily  . loperamide  2 mg Oral BID  . magnesium oxide  400 mg Oral Daily  .  multivitamin with minerals  1 tablet Oral Daily  . potassium chloride SA  20 mEq Oral Daily  . saccharomyces boulardii  250 mg Oral BID  . thiamine  100 mg Oral Daily   Or  . thiamine  100 mg Intravenous Daily    Continuous Infusions: . lactated ringers 75 mL/hr at 04/19/19 2030     LOS: 3 days     Kayleen Memos, MD [facility] Pager [phone]  If 7PM-7AM, please contact night-coverage [email or web address] Password [email or web address] 04/20/2019, 1:37 PM

## 2019-04-20 NOTE — Progress Notes (Addendum)
PT Cancellation Note  Patient Details Name: Christie Morrison MRN: [id number] DOB: [birth year]   Cancelled Treatment:    Reason Eval/Treat Not Completed: Patient declined, no reason specified. Will re-attempt this afternoon if time allows.  Returned this afternoon and patient refused again. Re-attempt tomorrow.    [name] 04/20/2019, 11:35 AM

## 2019-04-21 ENCOUNTER — Inpatient Hospital Stay (HOSPITAL_COMMUNITY): Payer: Self-pay

## 2019-04-21 LAB — CBC WITH DIFFERENTIAL/PLATELET
Abs Immature Granulocytes: 0.35 10*3/uL — ABNORMAL HIGH (ref 0.00–0.07)
Basophils Absolute: 0.1 10*3/uL (ref 0.0–0.1)
Basophils Relative: 0 %
Eosinophils Absolute: 0.5 10*3/uL (ref 0.0–0.5)
Eosinophils Relative: 3 %
HCT: 23.2 % — ABNORMAL LOW (ref 36.0–46.0)
Hemoglobin: 7.7 g/dL — ABNORMAL LOW (ref 12.0–15.0)
Immature Granulocytes: 2 %
Lymphocytes Relative: 9 %
Lymphs Abs: 1.6 10*3/uL (ref 0.7–4.0)
MCH: 34.8 pg — ABNORMAL HIGH (ref 26.0–34.0)
MCHC: 33.2 g/dL (ref 30.0–36.0)
MCV: 105 fL — ABNORMAL HIGH (ref 80.0–100.0)
Monocytes Absolute: 2.6 10*3/uL — ABNORMAL HIGH (ref 0.1–1.0)
Monocytes Relative: 14 %
Neutro Abs: 13.1 10*3/uL — ABNORMAL HIGH (ref 1.7–7.7)
Neutrophils Relative %: 72 %
Platelets: 287 10*3/uL (ref 150–400)
RBC: 2.21 MIL/uL — ABNORMAL LOW (ref 3.87–5.11)
RDW: 16 % — ABNORMAL HIGH (ref 11.5–15.5)
WBC: 18.2 10*3/uL — ABNORMAL HIGH (ref 4.0–10.5)
nRBC: 0 % (ref 0.0–0.2)

## 2019-04-21 LAB — PROTIME-INR
INR: 1.2 (ref 0.8–1.2)
Prothrombin Time: 14.7 seconds (ref 11.4–15.2)

## 2019-04-21 LAB — COMPREHENSIVE METABOLIC PANEL
ALT: 20 U/L (ref 0–44)
AST: 40 U/L (ref 15–41)
Albumin: 2.8 g/dL — ABNORMAL LOW (ref 3.5–5.0)
Alkaline Phosphatase: 145 U/L — ABNORMAL HIGH (ref 38–126)
Anion gap: 16 — ABNORMAL HIGH (ref 5–15)
BUN: 6 mg/dL — ABNORMAL LOW (ref 8–23)
CO2: 20 mmol/L — ABNORMAL LOW (ref 22–32)
Calcium: 8.9 mg/dL (ref 8.9–10.3)
Chloride: 103 mmol/L (ref 98–111)
Creatinine, Ser: 1.17 mg/dL — ABNORMAL HIGH (ref 0.44–1.00)
GFR calc Af Amer: 58 mL/min — ABNORMAL LOW (ref >60)
GFR calc non Af Amer: 50 mL/min — ABNORMAL LOW (ref >60)
Glucose, Bld: 80 mg/dL (ref 70–99)
Potassium: 3.9 mmol/L (ref 3.5–5.1)
Sodium: 139 mmol/L (ref 135–145)
Total Bilirubin: 0.4 mg/dL (ref 0.3–1.2)
Total Protein: 6.2 g/dL — ABNORMAL LOW (ref 6.5–8.1)

## 2019-04-21 MED ORDER — MAGNESIUM SULFATE 4 GM/100ML IV SOLN
4.0000 g | Freq: Once | INTRAVENOUS | Status: DC
  Filled 2019-04-21: qty 100

## 2019-04-21 MED ORDER — TECHNETIUM TC 99M MEBROFENIN IV KIT
5.0000 | PACK | Freq: Once | INTRAVENOUS | Status: AC | PRN
  Administered 2019-04-21: 5 via INTRAVENOUS

## 2019-04-21 NOTE — Progress Notes (Signed)
Occupational Therapy Treatment Patient Details Name: Christie Morrison MRN: [id number] DOB: 1955-06-12 Today's Date: 04/21/2019    History of present illness 63yo female presenting with seizure, confusion and disorientation. CT negative for acute CVA and code stroke ultimately cancelled. Admitted for seizure like activity in setting of severe electrolyte derangement. PMH cocaine use, ROH abuse, HLD, seizure   OT comments  Pt making fair progress towards OT goals this session. Session focus on functional mobility and standing grooming at sink. Pt complete functional mobility from EOB>sink with no AD and min guard assist. Pt complete standing grooming with min guard. Pt declined further functional mobility or BADL participation. Insistent on getting back to bed. DC plan remains appropriate, will continue to follow acutely per POC.    Follow Up Recommendations  Home health OT;Supervision/Assistance - 24 hour    Equipment Recommendations  Tub/shower seat    Recommendations for Other Services      Precautions / Restrictions Precautions Precautions: Fall Restrictions Weight Bearing Restrictions: No       Mobility Bed Mobility Overal bed mobility: Modified Independent                Transfers Overall transfer level: Needs assistance Equipment used: None Transfers: Sit to/from Stand Sit to Stand: Min guard         General transfer comment: min guard assist for safety and balance with no AD    Balance Overall balance assessment: Needs assistance Sitting-balance support: Bilateral upper extremity supported;Feet supported Sitting balance-Leahy Scale: Normal     Standing balance support: No upper extremity supported;During functional activity Standing balance-Leahy Scale: Fair Standing balance comment: able to maintain static standing with min guard assist                            ADL either performed or assessed with clinical judgement   ADL Overall  ADL's : Needs assistance/impaired     Grooming: Wash/dry face;Standing;Min guard Grooming Details (indicate cue type and reason): MAX Cues for pt to initiate tasks. min guard for balance in standing with no AD                 Toilet Transfer: Min guard;Ambulation Toilet Transfer Details (indicate cue type and reason): simulated via functional with min guard for safety with no AD         Functional mobility during ADLs: Min guard General ADL Comments: requires min guard assist for balance, MAX cues to participate, limited participation     Vision Baseline Vision/History: No visual deficits Patient Visual Report: No change from baseline Vision Assessment?: No apparent visual deficits   Perception     Praxis      Cognition Arousal/Alertness: Awake/alert Behavior During Therapy: Flat affect Overall Cognitive Status: No family/caregiver present to determine baseline cognitive functioning                                 General Comments: Pt remains slow to process, minimally interactive        Exercises     Shoulder Instructions       General Comments pt insistient on going back to work and reports her son and roommate can help with IADLs.    Pertinent Vitals/ Pain       Pain Assessment: No/denies pain  Home Living  Prior Functioning/Environment              Frequency  Min 2X/week        Progress Toward Goals  OT Goals(current goals can now be found in the care plan section)  Progress towards OT goals: Progressing toward goals  Acute Rehab OT Goals Patient Stated Goal: to go back to work  OT Goal Formulation: With patient Time For Goal Achievement: 05/02/19 Potential to Achieve Goals: Good  Plan Discharge plan remains appropriate    Co-evaluation                 AM-PAC OT "6 Clicks" Daily Activity     Outcome Measure   Help from another person eating meals?:  None Help from another person taking care of personal grooming?: A Little Help from another person toileting, which includes using toliet, bedpan, or urinal?: A Little Help from another person bathing (including washing, rinsing, drying)?: A Little Help from another person to put on and taking off regular upper body clothing?: A Little Help from another person to put on and taking off regular lower body clothing?: A Little 6 Click Score: 19    End of Session Equipment Utilized During Treatment: Gait belt  OT Visit Diagnosis: Unsteadiness on feet (R26.81);Cognitive communication deficit (R41.841)   Activity Tolerance Patient tolerated treatment well;Other (comment)(limited secondary to decreased participation)   Patient Left in bed;with call bell/phone within reach;with bed alarm set   Nurse Communication Mobility status;Other (comment)(asking to order dinner)        Time: [id number] OT Time Calculation (min): 12 min  Charges: OT General Charges $OT Visit: 1 Visit OT Treatments $Self Care/Home Management : 8-22 mins  Lanier Clam., COTA/L Acute Rehabilitation Services [phone]    Christie Morrison 04/21/2019, 4:20 PM

## 2019-04-21 NOTE — Consult Note (Addendum)
[facility] Surgery     Consult Note Christie Morrison 12-Jun-1955  [id number].    Requesting MD: Dr. Irene Pap Chief Complaint: Abdominal pain/fever/leukocytosis Reason for Consult: Evaluate for possible cholecystitis/cholelithiasis  HPI:  Patient is a 63 year old female with history of alcoholic cirrhosis, ongoing alcohol use, presented with possible alcohol-related seizure 09/2018.  Patient had a syncopal episode at home in her bathroom 04/16/19.  She is reported to have been confused and disoriented when EMS arrived.  She was posturing on admission to the ED and incontinent of stool.  Work-up was essentially negative for a stroke.  She was treated by neurology for seizures.  She had multiple electrolyte abnormalities which were treated.  She was admitted on 04/16/2019, she was treated for Klebsiella pneumoniae UTI.  She had persistent diarrhea with a negative C. difficile.  Despite treatments for issues she started running low-grade fevers 100-100.6 range.  Labs showed increasing WBC 18.2, she is anemic with hemoglobin 7.7, hematocrit 23.2.  She underwent abdominal ultrasound on 04/20/2019.  This shows cholelithiasis with gallbladder wall thickening, pericholecystic fluid concerning for acute cholecystitis, the nodular hepatic contour suggests cirrhosis with no focal hepatic lesions.  This led to concern for cholecystitis.  Patient's been sent for a HIDA scan and we are asked to see.  Currently the patient denies any abdominal pain.  She denies any nausea or vomiting or abdominal discomfort with p.o. intake.  She is unaware of any fevers at home prior to her admission.  WBC remains elevated.  Renal function is improving.  Magnesium 1.2, potassium 3.9, creatinine 1.17 alk phos 145 albumin 2.8 total protein 6.2 total bilirubin 0.4.   ROS: Review of Systems  Constitutional: Positive for fever. Negative for chills.  HENT: Negative.   Eyes: Negative.   Respiratory: Negative.   Cardiovascular:  Negative.   Gastrointestinal: Positive for diarrhea (diarrhea improving). Negative for blood in stool, constipation and melena.  Genitourinary: Negative.   Musculoskeletal: Negative.   Skin: Negative.   Neurological: Negative.   Endo/Heme/Allergies: Negative.   Psychiatric/Behavioral: Negative.     Family History  Problem Relation Age of Onset  . Diabetes Mellitus II Mother   . Kidney failure Mother   . Alcohol abuse Mother   . Diabetes Mellitus II Brother   . Kidney failure Brother   . Alcohol abuse Maternal Grandmother   . Hypertension Other     Past Medical History:  Diagnosis Date  . Acute hepatitis   . Cirrhosis ([place])   . Crack cocaine use   . Duodenitis   . ETOH abuse   . High cholesterol   . Hypertension   . Mallory-Weiss tear   . Seizures ([place])     Past Surgical History:  Procedure Laterality Date  . CESAREAN SECTION      Social History:  reports that she has never smoked. She has never used smokeless tobacco. She reports current alcohol use. She reports previous drug use. Frequency: 1.00 time per week. Drug: Cocaine.  Allergies: No Known Allergies  Prior to Admission medications   Not on File   Tobacco:  None Drugs:  Quit cocaine some months ago ETOH:  3  1/5ths per week of [employer] Works at [employer] lives with family  Blood pressure (!) 157/92, pulse 88, temperature 99.7 F (37.6 C), temperature source Oral, resp. rate 16, height '5\' 10"'$  (1.778 m), weight 59.3 kg, SpO2 100 %. Physical Exam: Physical Exam Constitutional:      General: She is not in acute distress.  Appearance: Normal appearance. She is ill-appearing. She is not toxic-appearing or diaphoretic.  HENT:     Head: Normocephalic and atraumatic.     Mouth/Throat:     Mouth: Mucous membranes are moist.  Eyes:     General: No scleral icterus.    Conjunctiva/sclera: Conjunctivae normal.     Comments: Pupils are equal  Neck:     Musculoskeletal: Normal range of motion and neck supple.  No neck rigidity or muscular tenderness.     Vascular: No carotid bruit.  Cardiovascular:     Rate and Rhythm: Normal rate and regular rhythm.     Heart sounds: No murmur.  Pulmonary:     Effort: Pulmonary effort is normal. No respiratory distress.     Breath sounds: Normal breath sounds. No stridor. No wheezing, rhonchi or rales.  Chest:     Chest wall: No tenderness.  Abdominal:     General: Abdomen is flat. There is no distension.     Palpations: Abdomen is soft. There is no mass.     Tenderness: There is no abdominal tenderness. There is no right CVA tenderness, left CVA tenderness, guarding or rebound.     Hernia: No hernia is present.  Lymphadenopathy:     Cervical: No cervical adenopathy.  Skin:    General: Skin is warm and dry.     Capillary Refill: Capillary refill takes 2 to 3 seconds.     Comments: She feels like she has a fever now  Neurological:     General: No focal deficit present.     Mental Status: She is alert and oriented to person, place, and time.     Cranial Nerves: No cranial nerve deficit.     Comments: Memory impaired  Psychiatric:        Mood and Affect: Mood normal.        Behavior: Behavior normal.        Thought Content: Thought content normal.        Judgment: Judgment normal.     Results for orders placed or performed during the hospital encounter of 04/16/19 (from the past 48 hour(s))  CBC     Status: Abnormal   Collection Time: 04/20/19  3:59 AM  Result Value Ref Range   WBC 14.4 (H) 4.0 - 10.5 K/uL   RBC 2.32 (L) 3.87 - 5.11 MIL/uL   Hemoglobin 8.1 (L) 12.0 - 15.0 g/dL   HCT 25.0 (L) 36.0 - 46.0 %   MCV 107.8 (H) 80.0 - 100.0 fL   MCH 34.9 (H) 26.0 - 34.0 pg   MCHC 32.4 30.0 - 36.0 g/dL   RDW 16.4 (H) 11.5 - 15.5 %   Platelets 270 150 - 400 K/uL   nRBC 0.0 0.0 - 0.2 %    Comment: Performed at [facility], [address] [place], [place] [address]  Procalcitonin     Status: None   Collection Time: 04/20/19  3:59 AM  Result  Value Ref Range   Procalcitonin 0.41 ng/mL    Comment:        Interpretation: PCT (Procalcitonin) <= 0.5 ng/mL: Systemic infection (sepsis) is not likely. Local bacterial infection is possible. (NOTE)       Sepsis PCT Algorithm           Lower Respiratory Tract  Infection PCT Algorithm    ----------------------------     ----------------------------         PCT < 0.25 ng/mL                PCT < 0.10 ng/mL         Strongly encourage             Strongly discourage   discontinuation of antibiotics    initiation of antibiotics    ----------------------------     -----------------------------       PCT 0.25 - 0.50 ng/mL            PCT 0.10 - 0.25 ng/mL               OR       >80% decrease in PCT            Discourage initiation of                                            antibiotics      Encourage discontinuation           of antibiotics    ----------------------------     -----------------------------         PCT >= 0.50 ng/mL              PCT 0.26 - 0.50 ng/mL               AND        <80% decrease in PCT             Encourage initiation of                                             antibiotics       Encourage continuation           of antibiotics    ----------------------------     -----------------------------        PCT >= 0.50 ng/mL                  PCT > 0.50 ng/mL               AND         increase in PCT                  Strongly encourage                                      initiation of antibiotics    Strongly encourage escalation           of antibiotics                                     -----------------------------                                           PCT <= 0.25 ng/mL  OR                                        > 80% decrease in PCT                                     Discontinue / Do not initiate                                             antibiotics Performed at [facility], [place] [address], [place], [place] [address]   Lactic acid, plasma     Status: None   Collection Time: 04/20/19  3:59 AM  Result Value Ref Range   Lactic Acid, Venous 1.9 0.5 - 1.9 mmol/L    Comment: Performed at [facility] [address], [place], [place] [address]  Basic metabolic panel     Status: Abnormal   Collection Time: 04/20/19  3:59 AM  Result Value Ref Range   Sodium 139 135 - 145 mmol/L   Potassium 3.9 3.5 - 5.1 mmol/L   Chloride 106 98 - 111 mmol/L   CO2 25 22 - 32 mmol/L   Glucose, Bld 99 70 - 99 mg/dL   BUN 10 8 - 23 mg/dL   Creatinine, Ser 1.13 (H) 0.44 - 1.00 mg/dL   Calcium 8.5 (L) 8.9 - 10.3 mg/dL   GFR calc non Af Amer 52 (L) >60 mL/min   GFR calc Af Amer >60 >60 mL/min   Anion gap 8 5 - 15    Comment: Performed at [facility], [place] [address] [place], [place] [address]  Magnesium     Status: Abnormal   Collection Time: 04/20/19  2:00 PM  Result Value Ref Range   Magnesium 1.2 (L) 1.7 - 2.4 mg/dL    Comment: Performed at [facility] [address], [place], [place] [address]  CBC with Differential/Platelet     Status: Abnormal   Collection Time: 04/21/19  6:02 AM  Result Value Ref Range   WBC 18.2 (H) 4.0 - 10.5 K/uL    Comment: WHITE COUNT CONFIRMED ON SMEAR   RBC 2.21 (L) 3.87 - 5.11 MIL/uL   Hemoglobin 7.7 (L) 12.0 - 15.0 g/dL   HCT 23.2 (L) 36.0 - 46.0 %   MCV 105.0 (H) 80.0 - 100.0 fL   MCH 34.8 (H) 26.0 - 34.0 pg   MCHC 33.2 30.0 - 36.0 g/dL   RDW 16.0 (H) 11.5 - 15.5 %   Platelets 287 150 - 400 K/uL   nRBC 0.0 0.0 - 0.2 %   Neutrophils Relative % 72 %   Neutro Abs 13.1 (H) 1.7 - 7.7 K/uL   Lymphocytes Relative 9 %   Lymphs Abs 1.6 0.7 - 4.0 K/uL   Monocytes Relative 14 %   Monocytes Absolute 2.6 (H) 0.1 - 1.0 K/uL   Eosinophils Relative 3 %   Eosinophils Absolute 0.5 0.0 - 0.5 K/uL   Basophils Relative 0 %   Basophils Absolute 0.1 0.0 - 0.1 K/uL   WBC Morphology VACUOLATED NEUTROPHILS    Immature  Granulocytes 2 %   Abs Immature Granulocytes 0.35 (H) 0.00 - 0.07  K/uL   Polychromasia PRESENT    Target Cells PRESENT     Comment: Performed at [facility], [place] [address], [place], [place] [address]  Comprehensive metabolic panel     Status: Abnormal   Collection Time: 04/21/19  6:02 AM  Result Value Ref Range   Sodium 139 135 - 145 mmol/L   Potassium 3.9 3.5 - 5.1 mmol/L   Chloride 103 98 - 111 mmol/L   CO2 20 (L) 22 - 32 mmol/L   Glucose, Bld 80 70 - 99 mg/dL   BUN 6 (L) 8 - 23 mg/dL   Creatinine, Ser 1.17 (H) 0.44 - 1.00 mg/dL   Calcium 8.9 8.9 - 10.3 mg/dL   Total Protein 6.2 (L) 6.5 - 8.1 g/dL   Albumin 2.8 (L) 3.5 - 5.0 g/dL   AST 40 15 - 41 U/L   ALT 20 0 - 44 U/L   Alkaline Phosphatase 145 (H) 38 - 126 U/L   Total Bilirubin 0.4 0.3 - 1.2 mg/dL   GFR calc non Af Amer 50 (L) >60 mL/min   GFR calc Af Amer 58 (L) >60 mL/min   Anion gap 16 (H) 5 - 15    Comment: Performed at [facility], [place] [address], [place], [place] [address]   US Abdomen Complete  Result Date: 04/20/2019 CLINICAL DATA:  Cirrhosis EXAM: ABDOMEN ULTRASOUND COMPLETE COMPARISON:  None. FINDINGS: Gallbladder: Multiple layering gallstones, including a 12 mm stone in the gallbladder neck. Associated gallbladder wall thickening/pericholecystic fluid. Common bile duct: Diameter: 4 mm Liver: No focal lesion identified. Within normal limits in parenchymal echogenicity. Portal vein is patent on color Doppler imaging with normal direction of blood flow towards the liver. IVC: No abnormality visualized. Pancreas: Visualized portion unremarkable. Spleen: Size and appearance within normal limits. Right Kidney: Length: 12.8 cm. Echogenicity within normal limits. No mass or hydronephrosis visualized. Left Kidney: Length: 11.5 cm. Echogenicity within normal limits. No mass or hydronephrosis visualized. Abdominal aorta: No aneurysm visualized. Other findings: None. IMPRESSION: Cholelithiasis with gallbladder wall  thickening/pericholecystic fluid, concerning for acute cholecystitis. Correlate for right upper quadrant pain. If clinically warranted, consider hepatobiliary nuclear medicine scan for confirmation. Mildly nodular hepatic contour, suggesting cirrhosis. No focal hepatic lesion is seen. These results will be called to the ordering clinician or representative by the Radiologist Assistant, and communication documented in the PACS or zVision Dashboard. Electronically Signed   By: Julian Hy M.D.   On: 04/20/2019 20:59      Assessment/Plan Suspected seizures -alcohol-related? Alcohol withdrawal Electrolyte imbalance Sepsis secondary to Klebsiella pneumonia UTI Uncontrolled diarrhea -C. difficile negative Polysubstance use -cocaine/alcohol Hx alcoholic cirrhosis Hx acute hepatitis Hypertension Hx Mallory-Weiss tear Anemia -H/H 7.7/23.2 CKD QT prolongation Malnutrition  Fever/leukocytosis uncertain etiology Cholelithiasis  -HIDA scan pending  Plan: Patient currently has no abdominal pain denies remembering any abdominal pain prior to today's exam.  LFTs are essentially normal.  HIDA scan is pending.  We will follow with you.   HIDA 04/21/19 :  Normal hepatobiliary imaging with normal gallbladder function.  I do not think the gallbladder is the source of her fever/leukocytosis.  Earnstine Regal [facility] 04/21/2019, 12:55 PM Please see Amion for pager number during day hours 7:00am-4:30pm

## 2019-04-21 NOTE — Progress Notes (Signed)
Physical Therapy Treatment Patient Details Name: Christie Morrison MRN: [id number] DOB: [birth year] Today's Date: 04/21/2019    History of Present Illness 63yo female presenting with seizure, confusion and disorientation. CT negative for acute CVA and code stroke ultimately cancelled. Admitted for seizure like activity in setting of severe electrolyte derangement. PMH cocaine use, ROH abuse, HLD, seizure    PT Comments    Pt performed gt training with noted balance and strength deficits.  She remains to self limit progress and refused to participate further in strengthening and balance activities.  She remains to function well enough to return home with family support.      Follow Up Recommendations  Home health PT;Supervision/Assistance - 24 hour;Other (comment)     Equipment Recommendations  Rolling walker with 5" wheels    Recommendations for Other Services       Precautions / Restrictions Precautions Precautions: Fall Restrictions Weight Bearing Restrictions: No    Mobility  Bed Mobility Overal bed mobility: Modified Independent                Transfers Overall transfer level: Needs assistance Equipment used: None Transfers: Sit to/from Stand Sit to Stand: Min guard Stand pivot transfers: Min guard       General transfer comment: min guard for safety and balance.  Ambulation/Gait Ambulation/Gait assistance: Min guard Gait Distance (Feet): 110 Feet Assistive device: None Gait Pattern/deviations: Step-through pattern;Scissoring;Trendelenburg;Drifts right/left;Narrow base of support Gait velocity: decreased   General Gait Details: narrow BOS with intermittent scissoring pattern and R/L drift without BUE support; Min guard to maintain balance, easily fatigued   Stairs             Wheelchair Mobility    Modified Rankin (Stroke Patients Only)       Balance Overall balance assessment: Needs assistance Sitting-balance support: Bilateral upper  extremity supported;Feet supported Sitting balance-Leahy Scale: Normal     Standing balance support: No upper extremity supported;During functional activity Standing balance-Leahy Scale: Fair Standing balance comment: able to maintain static standing with min guard assist                             Cognition Arousal/Alertness: Awake/alert Behavior During Therapy: Flat affect Overall Cognitive Status: No family/caregiver present to determine baseline cognitive functioning                                 General Comments: Pt remains slow to process, minimally interactive      Exercises      General Comments General comments (skin integrity, edema, etc.): pt insistient on going back to work and reports her son and roommate can help with IADLs.      Pertinent Vitals/Pain Pain Assessment: No/denies pain    Home Living                      Prior Function            PT Goals (current goals can now be found in the care plan section) Acute Rehab PT Goals Patient Stated Goal: to go back to work  Potential to Achieve Goals: Good Progress towards PT goals: Progressing toward goals    Frequency    Min 3X/week      PT Plan Current plan remains appropriate    Co-evaluation  AM-PAC PT "6 Clicks" Mobility   Outcome Measure  Help needed turning from your back to your side while in a flat bed without using bedrails?: None Help needed moving from lying on your back to sitting on the side of a flat bed without using bedrails?: None Help needed moving to and from a bed to a chair (including a wheelchair)?: A Little Help needed standing up from a chair using your arms (e.g., wheelchair or bedside chair)?: A Little Help needed to walk in hospital room?: A Little Help needed climbing 3-5 steps with a railing? : A Little 6 Click Score: 20    End of Session Equipment Utilized During Treatment: Gait belt Activity Tolerance:  Patient tolerated treatment well Patient left: in bed;with call bell/phone within reach;with bed alarm set   PT Visit Diagnosis: Unsteadiness on feet (R26.81);Difficulty in walking, not elsewhere classified (R26.2);Other abnormalities of gait and mobility (R26.89);Muscle weakness (generalized) (M62.81)     Time: [id number] PT Time Calculation (min) (ACUTE ONLY): 12 min  Charges:  $Gait Training: 8-22 mins                     Erasmo Leventhal , PTA Acute Rehabilitation Services Pager [phone] Office [place] 04/21/2019, 6:18 PM

## 2019-04-21 NOTE — Progress Notes (Signed)
PROGRESS NOTE  Christie Morrison [id number] DOB: 17-Aug-1955 DOA: 04/16/2019 PCP: Christie Pounds, NP  HPI/Recap of past 24 hours: Christie Morrison a 63 y.o.Fwith hx of alcoholic cirrhosis, ongoing alcohol use, chronic hypomagnesemia and hypokalemia, and alcohol-related seizure in May of this yearwho presents withseizure.  Patient is very sleepy historian, but she relates feeling in her normal state of health until this evening. Per report from ER triage, patient was at home tonight when roommate heard her fall in the bathroom, arrived to find her posturing, incontinent of stool. EMS arrived and found her confused and disoriented.  In the ER, patient was tachycardic, blood pressure slightly elevated. Initially called "code stroke" because of aphasia, but this was subsequently canceled as it was thought to be due to postictal state.  Sodium 133, potassium 2.9, magnesium 0.5, calcium 7.6, creatinine 1.6 (up from baseline 0.8), mild transaminitis, leukocytosis, macrocytic anemia. Anion gap 25, CT head unremarkable.  She was given IV magnesium, IV calcium, IV potassium, and started on Keppra. Neurology was consulted and the hospital service was asked to admit for severe electrolyte derangements and seizure.  11/26: Reports seizure-like event 2 months ago.  Regular liquor consumption. Last liquor use was yesterday. She would like some assistance with alcohol cessation. 11/27: Elevated WBC 19,000, lactic acid greater than 3 and procalcitonin 0.73.  Positive UA.  Patient started on Rocephin empirically for presumed UTI.  Seen by neurology, seizures likely secondary to alcohol withdrawal.  EEG done on 04/16/2019, no seizure activity.  CIWA protocol on Librium, multivitamin, folic acid and thiamine. 11/28: Persistent diarrhea.  C. difficile PCR negative.  GI panel in process.  Started on Imodium twice daily and probiotics twice daily. 11/29: Urine culture sensitivities with resistance  to ampicillin and nitrofurantoin.  Started on Keflex 500 mg 3 times daily x5 days. 11/30: Watery stools persist.  T-max of 100.5 and leukocytosis 14,000. Ordered abdominal ultrasound and started Zosyn empirically (Dced Keflex) due to suspected intra-abdominal infection.  Blood cultures negative to date.  Abdominal ultrasound done on 04/20/2019 suggestive of cholelithiasis with concern for acute cholecystitis.  04/21/19: Patient was seen and examined.  Right upper quadrant pain on palpation.  Worsening leukocytosis with WBC of 18,000.  HIDA scan obtained and pending.  General surgery has been contacted and will see in consultation.  Assessment/Plan: Principal Problem:   Seizures (Christie Morrison) Active Problems:   Cocaine abuse (HCC)   Cirrhosis, alcoholic (HCC)   Hypokalemia   Hypocalcemia   Hypomagnesemia   Prolonged QT interval   Macrocytic anemia   Hyponatremia   AKI (acute kidney injury) (Christie Morrison)   Seizure (Christie Morrison)  Alcohol-related seizure suspect provoked by alcohol withdrawal and electrolytes imbalance Presented with alcohol level less than 10 and significant electrolyte abnormalities Had a witnessed seizure at home by her son after decrease amount of alcohol intake Seen by neurology, EEG no sign of seizure activity No indication for seizure treatment at this time Completed CIWA protocol.  No evidence of alcohol withdrawal at this time.  Continue multivitamin, thiamine and folic acid.  Continue to replete electrolytes as indicated.  Sepsis secondary to klebsiella pneumoniae UTI and calculus cholecystitis.   Presented with leukocytosis with WBC of 27,000, tachycardia with UA suggestive of UTI obtained on 04/17/2019. Urine culture taken on 04/17/2019 grew greater than 100,000 colonies of Klebsiella pneumoniae.  Sensitivities show resistance to nitrofurantoin and ampicillin. Completed 3 days of Rocephin Switched to Keflex 500 mg 3 times daily x5 days on 04/19/19 Persistent watery stool reported.  C. difficile PCR negative on 04/17/2019.  GI panel by PCR in process. Fever with T-max 100.5 on 04/20/2019.  Abdominal ultrasound suggestive of acute calculous cholecystitis. Started on IV Zosyn empirically on 04/20/2019. HIDA scan obtained on 04/21/2019, results are pending. General surgery consulted on 04/21/2019. Leukocytosis is worsening up to 19,000. Continue IV fluids and IV antibiotics Continue to monitor fever curve and WBC Repeat CBC in the morning  Newly diagnosed acute calculous cholecystitis Management as stated above Right upper quadrant pain on palpation HIDA scan ordered, pending results Elevated alkaline phosphatase T bili, AST ALT, normal. General surgery will evaluate. N.p.o. until seen by surgery.  High anion gap metabolic acidosis Bicarb 20 and anion gap of 16 on 04/21/2019. Continue IV fluid and repeat CMP in the morning  Hypomagnesemia Presented with magnesium of 0.5 Magnesium 1.2 on 04/20/2019 Repleted with 4 g IV magnesium Continue oral magnesium oxide 400 mg daily Repeat magnesium level in the morning  Diarrhea with unclear etiology, present on admission. C. difficile PCR negative GI panel in process Continue Florastor probiotic twice daily and Imodium 2 mg twice daily x 3 days Continue IV fluid to avoid dehydration.  Resolved concern for alcohol withdrawal Presented with alcohol level less than 10. Completed CIWA protocol Continue multivitamins, thiamine and folic acid. No sign of alcohol withdrawal at the time of this visit. Has received alcohol cessation counseling at bedside.  Polysubstance abuse including cocaine and alcohol use disorder Polysubstance abuse cessation counseled on at bedside Last cocaine use was days ago UDS positive for cocaine and benzodiazepine  AKI likely prerenal secondary to dehydration in the setting of diarrhea Baseline creatinine 1.1 with GFR greater than 60 Presented with creatinine 1.4 and GFR 46 Continue to  avoid nephrotoxins Continue to monitor urine output Daily BMP  Ambulatory dysfunction/physical debility PT OT assessed and recommended home health PT OT Continue activities with assistance and fall precautions Case manager working on home health services for DC planning  Resolved transient tachycardia Personally reviewed EKG done on 04/17/2019 showed sinus tachycardia rate of 103 Suspect related to her sepsis  Prolonged QTC Reviewed twelve-lead EKG today showing QTC 520. Avoid QTC prolonging agents-monitor electrolytes and replete as indicated Repeat twelve-lead EKG.   DVT prophylaxis: Lovenox subcu daily Code Status: Full code Family Communication:   None at bedside.  Disposition Plan:  Patient is currently not appropriate for discharge at this time due to ongoing work-up for right upper quadrant pain.  New diagnosis of acute calculus cholecystitis possibly requiring surgical intervention.  Consults called: Neurology, general surgery.    Objective: Vitals:   04/20/19 1147 04/20/19 1533 04/20/19 2329 04/21/19 0335  BP: (!) 159/97 123/79 (!) 144/82 (!) 157/92  Pulse: 92 88 87 88  Resp: 16 16 16 16   Temp: (!) 100.5 F (38.1 C) (!) 100.6 F (38.1 C) 98.7 F (37.1 C) 99.7 F (37.6 C)  TempSrc: Oral Oral Oral Oral  SpO2: 99% 100% 100% 100%  Weight:      Height:       No intake or output data in the 24 hours ending 04/21/19 1240 Filed Weights   04/16/19 1527  Weight: 59.3 kg    Exam:   General: 63 y.o. year-old female pleasant frail appearing.  In no acute distress.  Alert and oriented x3.  Cardiovascular: Regular rate and rhythm no rubs or gallops.  No JVD or thyromegaly noted.    Respiratory: Clear to Auscultation No Wheezes or Rales.    Abdomen: Right upper quadrant tenderness  on palpation.  Bowel sounds present.    Musculoskeletal: No lower extremity edema. Psychiatry: Mood is appropriate for condition and setting.  Data Reviewed: CBC: Recent Labs    Lab 04/16/19 0252 04/16/19 0255 04/16/19 1201 04/17/19 0324 04/20/19 0359 04/21/19 0602  WBC 27.9*  --   --  19.5* 14.4* 18.2*  NEUTROABS 21.8*  --   --   --   --  13.1*  HGB 9.3* 11.6*  --  8.0* 8.1* 7.7*  HCT 26.9* 34.0* 22.6* 23.1* 25.0* 23.2*  MCV 101.1*  --   --  101.3* 107.8* 105.0*  PLT 165  --   --  148* 270 [id number]   Basic Metabolic Panel: Recent Labs  Lab 04/16/19 0252 04/16/19 0255 04/16/19 0334 04/16/19 1159 04/17/19 0324 04/20/19 0359 04/20/19 1400 04/21/19 0602  NA 133* 131*  --  135 137 139  --  139  K 2.9* 3.2*  --  4.2 3.9 3.9  --  3.9  CL 86* 88*  --  93* 101 106  --  103  CO2 22  --   --  27 24 25   --  20*  GLUCOSE 143* 143*  --  96 98 99  --  80  BUN 9 10  --  9 12 10   --  6*  CREATININE 1.62* 1.30*  --  1.46* 1.41* 1.13*  --  1.17*  CALCIUM 7.6*  --   --  8.0* 7.4* 8.5*  --  8.9  MG  --   --  0.5* 1.1* 1.5*  --  1.2*  --   PHOS  --   --   --  1.6*  --   --   --   --    GFR: Estimated Creatinine Clearance: 46.7 mL/min (A) (by C-G formula based on SCr of 1.17 mg/dL (H)). Liver Function Tests: Recent Labs  Lab 04/16/19 0252 04/16/19 1159 04/21/19 0602  AST 76*  --  40  ALT 19  --  20  ALKPHOS 148*  --  145*  BILITOT 1.2  --  0.4  PROT 7.7  --  6.2*  ALBUMIN 3.9 3.4* 2.8*   No results for input(s): LIPASE, AMYLASE in the last 168 hours. No results for input(s): AMMONIA in the last 168 hours. Coagulation Profile: Recent Labs  Lab 04/16/19 0252  INR 1.2   Cardiac Enzymes: No results for input(s): CKTOTAL, CKMB, CKMBINDEX, TROPONINI in the last 168 hours. BNP (last 3 results) No results for input(s): PROBNP in the last 8760 hours. HbA1C: No results for input(s): HGBA1C in the last 72 hours. CBG: Recent Labs  Lab 04/16/19 0250  GLUCAP 146*   Lipid Profile: No results for input(s): CHOL, HDL, LDLCALC, TRIG, CHOLHDL, LDLDIRECT in the last 72 hours. Thyroid Function Tests: No results for input(s): TSH, T4TOTAL, FREET4, T3FREE,  THYROIDAB in the last 72 hours. Anemia Panel: No results for input(s): VITAMINB12, FOLATE, FERRITIN, TIBC, IRON, RETICCTPCT in the last 72 hours. Urine analysis:    Component Value Date/Time   COLORURINE AMBER (A) 04/17/2019 0804   APPEARANCEUR HAZY (A) 04/17/2019 0804   LABSPEC 1.019 04/17/2019 0804   PHURINE 5.0 04/17/2019 0804   GLUCOSEU NEGATIVE 04/17/2019 0804   HGBUR SMALL (A) 04/17/2019 0804   BILIRUBINUR NEGATIVE 04/17/2019 0804   KETONESUR NEGATIVE 04/17/2019 0804   PROTEINUR NEGATIVE 04/17/2019 0804   NITRITE NEGATIVE 04/17/2019 0804   LEUKOCYTESUR LARGE (A) 04/17/2019 0804   Sepsis Labs: @LABRCNTIP (procalcitonin:4,lacticidven:4)  ) Recent Results (from the past 240  hour(s))  SARS CORONAVIRUS 2 (TAT 6-24 HRS) Nasopharyngeal Nasopharyngeal Swab     Status: None   Collection Time: 04/16/19  5:40 AM   Specimen: Nasopharyngeal Swab  Result Value Ref Range Status   SARS Coronavirus 2 NEGATIVE NEGATIVE Final    Comment: (NOTE) SARS-CoV-2 target nucleic acids are NOT DETECTED. The SARS-CoV-2 RNA is generally detectable in upper and lower respiratory specimens during the acute phase of infection. Negative results do not preclude SARS-CoV-2 infection, do not rule out co-infections with other pathogens, and should not be used as the sole basis for treatment or other patient management decisions. Negative results must be combined with clinical observations, patient history, and epidemiological information. The expected result is Negative. Fact Sheet for Patients: SugarRoll.be Fact Sheet for Healthcare Providers: https://www.woods-mathews.com/ This test is not yet approved or cleared by the [place] FDA and  has been authorized for detection and/or diagnosis of SARS-CoV-2 by FDA under an Emergency Use Authorization (EUA). This EUA will remain  in effect (meaning this test can be used) for the duration of the COVID-19 declaration  under Section 56 4(b)(1) of the Act, 21 U.S.C. section 360bbb-3(b)(1), unless the authorization is terminated or revoked sooner. Performed at [facility], [place] [address] [place], [place] [address]   Culture, Urine     Status: Abnormal   Collection Time: 04/17/19  5:51 AM   Specimen: Urine, Clean Catch  Result Value Ref Range Status   Specimen Description URINE, CLEAN CATCH  Final   Special Requests   Final    Normal Performed at [facility], [place] [address], [place], [place] [address]    Culture >=100,000 COLONIES/mL KLEBSIELLA PNEUMONIAE (A)  Final   Report Status 04/19/2019 FINAL  Final   Organism ID, Bacteria KLEBSIELLA PNEUMONIAE (A)  Final      Susceptibility   Klebsiella pneumoniae - MIC*    AMPICILLIN RESISTANT Resistant     CEFAZOLIN <=4 SENSITIVE Sensitive     CEFTRIAXONE <=1 SENSITIVE Sensitive     CIPROFLOXACIN <=0.25 SENSITIVE Sensitive     GENTAMICIN <=1 SENSITIVE Sensitive     IMIPENEM <=0.25 SENSITIVE Sensitive     NITROFURANTOIN 64 INTERMEDIATE Intermediate     TRIMETH/SULFA <=20 SENSITIVE Sensitive     AMPICILLIN/SULBACTAM 4 SENSITIVE Sensitive     PIP/TAZO <=4 SENSITIVE Sensitive     Extended ESBL NEGATIVE Sensitive     * >=100,000 COLONIES/mL KLEBSIELLA PNEUMONIAE  Culture, blood (routine x 2)     Status: None (Preliminary result)   Collection Time: 04/17/19  6:20 AM   Specimen: BLOOD  Result Value Ref Range Status   Specimen Description BLOOD RIGHT ARM  Final   Special Requests   Final    BOTTLES DRAWN AEROBIC AND ANAEROBIC Blood Culture adequate volume   Culture   Final    NO GROWTH 4 DAYS Performed at [facility], [address], [place], [place] [address]    Report Status PENDING  Incomplete  Culture, blood (routine x 2)     Status: None (Preliminary result)   Collection Time: 04/17/19  6:20 AM   Specimen: BLOOD  Result Value Ref Range Status   Specimen Description BLOOD RIGHT ARM  Final   Special Requests   Final     BOTTLES DRAWN AEROBIC ONLY Blood Culture adequate volume   Culture   Final    NO GROWTH 4 DAYS Performed at [facility], [place] [address] [place],  [address]  Report Status PENDING  Incomplete  C difficile quick scan w PCR reflex     Status: None   Collection Time: 04/17/19 12:26 PM   Specimen: Rectum; Stool  Result Value Ref Range Status   C Diff antigen NEGATIVE NEGATIVE Final   C Diff toxin NEGATIVE NEGATIVE Final   C Diff interpretation No C. difficile detected.  Final    Comment: Performed at [facility], [place] [address] [place], [place] [address]      Studies: US Abdomen Complete  Result Date: 04/20/2019 CLINICAL DATA:  Cirrhosis EXAM: ABDOMEN ULTRASOUND COMPLETE COMPARISON:  None. FINDINGS: Gallbladder: Multiple layering gallstones, including a 12 mm stone in the gallbladder neck. Associated gallbladder wall thickening/pericholecystic fluid. Common bile duct: Diameter: 4 mm Liver: No focal lesion identified. Within normal limits in parenchymal echogenicity. Portal vein is patent on color Doppler imaging with normal direction of blood flow towards the liver. IVC: No abnormality visualized. Pancreas: Visualized portion unremarkable. Spleen: Size and appearance within normal limits. Right Kidney: Length: 12.8 cm. Echogenicity within normal limits. No mass or hydronephrosis visualized. Left Kidney: Length: 11.5 cm. Echogenicity within normal limits. No mass or hydronephrosis visualized. Abdominal aorta: No aneurysm visualized. Other findings: None. IMPRESSION: Cholelithiasis with gallbladder wall thickening/pericholecystic fluid, concerning for acute cholecystitis. Correlate for right upper quadrant pain. If clinically warranted, consider hepatobiliary nuclear medicine scan for confirmation. Mildly nodular hepatic contour, suggesting cirrhosis. No focal hepatic lesion is seen. These results will be called to the ordering clinician or representative by the Radiologist  Assistant, and communication documented in the PACS or zVision Dashboard. Electronically Signed   By: Julian Hy M.D.   On: 04/20/2019 20:59    Scheduled Meds:  enoxaparin (LOVENOX) injection  40 mg Subcutaneous [id number]   folic acid  1 mg Oral Daily   loperamide  2 mg Oral BID   magnesium oxide  400 mg Oral Daily   multivitamin with minerals  1 tablet Oral Daily   potassium chloride SA  20 mEq Oral Daily   saccharomyces boulardii  250 mg Oral BID   thiamine  100 mg Oral Daily   Or   thiamine  100 mg Intravenous Daily    Continuous Infusions:  lactated ringers 75 mL/hr at 04/19/19 2030   magnesium sulfate bolus IVPB     piperacillin-tazobactam (ZOSYN)  IV 3.375 g (04/21/19 0446)     LOS: 4 days     Kayleen Memos, MD [facility] Pager [phone]  If 7PM-7AM, please contact night-coverage [email or web address] Password [email or web address] 04/21/2019, 12:40 PM

## 2019-04-21 NOTE — Progress Notes (Signed)
OT Cancellation Note  Patient Details Name: PETE STRIANO MRN: [id number] DOB: November 03, 1955   Cancelled Treatment:    Reason Eval/Treat Not Completed: Patient declined, no reason specified;Fatigue/lethargy limiting ability to participate. Pt declined OT session stating she was too tired, will check back as time allows.  Lanier Clam., COTA/L Acute Rehabilitation Services [phone] [place] 04/21/2019, 2:26 PM

## 2019-04-22 DIAGNOSIS — A498 Other bacterial infections of unspecified site: Secondary | ICD-10-CM

## 2019-04-22 DIAGNOSIS — N179 Acute kidney failure, unspecified: Secondary | ICD-10-CM

## 2019-04-22 DIAGNOSIS — R9431 Abnormal electrocardiogram [ECG] [EKG]: Secondary | ICD-10-CM

## 2019-04-22 DIAGNOSIS — F1410 Cocaine abuse, uncomplicated: Secondary | ICD-10-CM

## 2019-04-22 DIAGNOSIS — E871 Hypo-osmolality and hyponatremia: Secondary | ICD-10-CM

## 2019-04-22 LAB — CBC WITH DIFFERENTIAL/PLATELET
Abs Immature Granulocytes: 0.23 10*3/uL — ABNORMAL HIGH (ref 0.00–0.07)
Basophils Absolute: 0.1 10*3/uL (ref 0.0–0.1)
Basophils Relative: 1 %
Eosinophils Absolute: 0.6 10*3/uL — ABNORMAL HIGH (ref 0.0–0.5)
Eosinophils Relative: 3 %
HCT: 24.1 % — ABNORMAL LOW (ref 36.0–46.0)
Hemoglobin: 7.9 g/dL — ABNORMAL LOW (ref 12.0–15.0)
Immature Granulocytes: 1 %
Lymphocytes Relative: 9 %
Lymphs Abs: 1.8 10*3/uL (ref 0.7–4.0)
MCH: 34.1 pg — ABNORMAL HIGH (ref 26.0–34.0)
MCHC: 32.8 g/dL (ref 30.0–36.0)
MCV: 103.9 fL — ABNORMAL HIGH (ref 80.0–100.0)
Monocytes Absolute: 2.7 10*3/uL — ABNORMAL HIGH (ref 0.1–1.0)
Monocytes Relative: 13 %
Neutro Abs: 15.3 10*3/uL — ABNORMAL HIGH (ref 1.7–7.7)
Neutrophils Relative %: 73 %
Platelets: 317 10*3/uL (ref 150–400)
RBC: 2.32 MIL/uL — ABNORMAL LOW (ref 3.87–5.11)
RDW: 15.9 % — ABNORMAL HIGH (ref 11.5–15.5)
WBC: 20.7 10*3/uL — ABNORMAL HIGH (ref 4.0–10.5)
nRBC: 0 % (ref 0.0–0.2)

## 2019-04-22 LAB — CULTURE, BLOOD (ROUTINE X 2)
Culture: NO GROWTH
Culture: NO GROWTH
Special Requests: ADEQUATE
Special Requests: ADEQUATE

## 2019-04-22 LAB — COMPREHENSIVE METABOLIC PANEL
ALT: 19 U/L (ref 0–44)
AST: 27 U/L (ref 15–41)
Albumin: 2.7 g/dL — ABNORMAL LOW (ref 3.5–5.0)
Alkaline Phosphatase: 138 U/L — ABNORMAL HIGH (ref 38–126)
Anion gap: 10 (ref 5–15)
BUN: 6 mg/dL — ABNORMAL LOW (ref 8–23)
CO2: 25 mmol/L (ref 22–32)
Calcium: 9 mg/dL (ref 8.9–10.3)
Chloride: 103 mmol/L (ref 98–111)
Creatinine, Ser: 1.28 mg/dL — ABNORMAL HIGH (ref 0.44–1.00)
GFR calc Af Amer: 52 mL/min — ABNORMAL LOW (ref >60)
GFR calc non Af Amer: 45 mL/min — ABNORMAL LOW (ref >60)
Glucose, Bld: 90 mg/dL (ref 70–99)
Potassium: 4.1 mmol/L (ref 3.5–5.1)
Sodium: 138 mmol/L (ref 135–145)
Total Bilirubin: 0.7 mg/dL (ref 0.3–1.2)
Total Protein: 6.4 g/dL — ABNORMAL LOW (ref 6.5–8.1)

## 2019-04-22 LAB — MAGNESIUM: Magnesium: 1.1 mg/dL — ABNORMAL LOW (ref 1.7–2.4)

## 2019-04-22 MED ORDER — MAGNESIUM SULFATE 4 GM/100ML IV SOLN
4.0000 g | Freq: Once | INTRAVENOUS | Status: AC
  Administered 2019-04-22: 4 g via INTRAVENOUS
  Filled 2019-04-22: qty 100

## 2019-04-22 NOTE — Progress Notes (Signed)
Physical Therapy Treatment Patient Details Name: Christie Morrison MRN: [id number] DOB: March 04, 1956 Today's Date: 04/22/2019    History of Present Illness 63yo female presenting with seizure, confusion and disorientation. CT negative for acute CVA and code stroke ultimately cancelled. Admitted for seizure like activity in setting of severe electrolyte derangement. PMH cocaine use, ROH abuse, HLD, seizure    PT Comments    Pt progressing well towards her physical therapy goals. Ambulating 350 feet with no assistive device at a supervision to min guard assist level. Noted mild static and dynamic gait balance deficits; frequently reaching for single upper extremity support in the hallway. Continues with decreased activity tolerance, HR peak 106 bpm. D/c plan remains appropriate.    Follow Up Recommendations  Home health PT;Supervision/Assistance - 24 hour     Equipment Recommendations  None recommended by PT (pt refusing)   Recommendations for Other Services       Precautions / Restrictions Precautions Precautions: Fall Restrictions Weight Bearing Restrictions: No    Mobility  Bed Mobility Overal bed mobility: Independent                Transfers Overall transfer level: Needs assistance Equipment used: None Transfers: Sit to/from Stand Sit to Stand: Supervision            Ambulation/Gait Ambulation/Gait assistance: Min guard;Supervision Gait Distance (Feet): 350 Feet Assistive device: None Gait Pattern/deviations: Step-through pattern;Scissoring;Trendelenburg;Drifts right/left;Narrow base of support Gait velocity: decreased   General Gait Details: pt reaching for single UE support intermittently, demonstrates one episodes of mild lateral LOB with scissoring requiring min guard to correct. Otherwise, at a supervision level. Noted left foot pronation in stance phase   Stairs             Wheelchair Mobility    Modified Rankin (Stroke Patients Only)        Balance Overall balance assessment: Needs assistance Sitting-balance support: Feet supported Sitting balance-Leahy Scale: Normal     Standing balance support: No upper extremity supported;During functional activity Standing balance-Leahy Scale: Fair       Tandem Stance - Right Leg: 0 Tandem Stance - Left Leg: 0 [place] - Eyes Opened: 10     High Level Balance Comments: Feet apart - eyes closed: 10 seconds            Cognition Arousal/Alertness: Awake/alert Behavior During Therapy: Flat affect Overall Cognitive Status: No family/caregiver present to determine baseline cognitive functioning                                 General Comments: Slower processing, flat affect, decreased engagement in therapy      Exercises      General Comments        Pertinent Vitals/Pain Pain Assessment: Faces Faces Pain Scale: Hurts little more Pain Location: right flank Pain Descriptors / Indicators: Aching Pain Intervention(s): Monitored during session    Home Living                      Prior Function            PT Goals (current goals can now be found in the care plan section) Acute Rehab PT Goals Patient Stated Goal: to go back to work  Potential to Achieve Goals: Good Progress towards PT goals: Progressing toward goals    Frequency    Min 3X/week      PT Plan Current plan remains  appropriate    Co-evaluation              AM-PAC PT "6 Clicks" Mobility   Outcome Measure  Help needed turning from your back to your side while in a flat bed without using bedrails?: None Help needed moving from lying on your back to sitting on the side of a flat bed without using bedrails?: None Help needed moving to and from a bed to a chair (including a wheelchair)?: None Help needed standing up from a chair using your arms (e.g., wheelchair or bedside chair)?: None Help needed to walk in hospital room?: A Little Help needed climbing 3-5  steps with a railing? : A Little 6 Click Score: 22    End of Session Equipment Utilized During Treatment: Gait belt Activity Tolerance: Patient tolerated treatment well Patient left: in bed;with call bell/phone within reach Nurse Communication: Mobility status PT Visit Diagnosis: Unsteadiness on feet (R26.81);Difficulty in walking, not elsewhere classified (R26.2);Other abnormalities of gait and mobility (R26.89);Muscle weakness (generalized) (M62.81)     Time: [id number] PT Time Calculation (min) (ACUTE ONLY): 16 min  Charges:  $Therapeutic Activity: 8-22 mins                     Ellamae Sia, PT, DPT Acute Rehabilitation Services Pager [phone] Office [phone]    Willy Eddy 04/22/2019, 2:50 PM

## 2019-04-22 NOTE — Progress Notes (Signed)
PROGRESS NOTE    Christie Morrison  [id number] DOB: [birth year] DOA: 04/16/2019 PCP: Gildardo Pounds, NP  Brief Narrative:  HPI per Dr. Myrene Buddy on 04/16/2019 Christie Morrison is a 63 y.o. F with hx of alcoholic cirrhosis, ongoing alcohol use, chronic hypomagnesemia and hypokalemia, and alcohol-related seizure in May of this year who presents with seizure.  Patient is very sleepy historian, but she relates feeling in her normal state of health until this evening.  Per report from ER triage, patient was at home tonight when roommate heard her fall in the bathroom, arrived to find her posturing, incontinent of stool.  EMS arrived and found her confused and disoriented.  In the ER, patient was tachycardic, blood pressure slightly elevated.  Initially called "code stroke" because of aphasia, but this was subsequently canceled as it was thought to be due to postictal state.  Sodium 133, potassium 2.9, magnesium 0.5, calcium 7.6, creatinine 1.6 (up from baseline 0.8), mild transaminitis, leukocytosis, macrocytic anemia.  Anion gap 25, CT head unremarkable.  She was given IV magnesium, IV calcium, IV potassium, and started on Keppra.  Neurology were consulted and the hospital service was asked to admit for severe electrolyte derangements and seizure.  **Interim History Patient admitted to having regular liquor consumption and stated that she drank the day before she presented.  She had an elevated WBC which was trending down but now trending back up.  She is completed her CIWA protocol Librium multivitamins and folic acid.  On 04/18/2019 she developed persistent diarrhea and C. difficile PCR was negative.  Urine culture showed Klebsiella UTI and antibiotics for increased to IV Zosyn.  There is concern for acalculous cholecystitis given her abdominal ultrasound but with a negative HIDA scan and general surgery evaluation did not feel that this is the cause of her worsening white count.   Her HIDA scan was normal.  Diet was advanced to 2 g sodium diet given her history of cirrhosis  Assessment & Plan:   Principal Problem:   Seizures ([place]) Active Problems:   Cocaine abuse (HCC)   Cirrhosis, alcoholic (HCC)   Hypokalemia   Hypocalcemia   Hypomagnesemia   Prolonged QT interval   Macrocytic anemia   Hyponatremia   AKI (acute kidney injury) ([place])   Seizure ([place])  Alcohol-related seizure suspect provoked by alcohol withdrawal and electrolytes imbalance -Presented with alcohol level less than 10 and significant electrolyte abnormalities -Had a witnessed seizure at home by her son after decrease amount of alcohol intake -Seen by neurology, EEG no sign of seizure activity -No indication for seizure treatment at this time -Completed CIWA protocol.   -No evidence of alcohol withdrawal at this time.   -Continue multivitamin, thiamine and folic acid.   -Continue to replete electrolytes as indicated.  Sepsis secondary to klebsiella pneumoniae UTI and ? calculus cholecystitis.   -Presented with leukocytosis with WBC of 27,000, tachycardia with UA suggestive of UTI obtained on 04/17/2019. -Urine culture taken on 04/17/2019 grew greater than 100,000 colonies of Klebsiella pneumoniae.   -Sensitivities show resistance to nitrofurantoin and ampicillin. -Completed 3 days of Rocephin and Switched to Keflex 500 mg 3 times daily x5 days on 04/19/19 but had Persistent watery stool reported.  C. difficile PCR negative on 04/17/2019.  GI panel by PCR in process still -Fever with T-max 100.5 on 04/20/2019.  Abdominal ultrasound suggestive of acute calculous cholecystitis. -PCT was 0.41 -Started on IV Zosyn empirically on 04/20/2019 instead given concern for Acute Calculous Cholecystitis. -HIDA scan  obtained on 04/21/2019 and showed "Normal hepatobiliary imaging with normal gallbladder function.General surgery consulted on 04/21/2019." -Leukocytosis is worsening and now went from 14.4 -> 18.2  -> 20.7; ? Related to Diarrheal process  -Stopped  IV fluids; C/w IV antibiotics -Continue to monitor fever curve and WBC -Repeat CBC in the morning  ? Acute calculous cholecystitis -Management as stated above -Right upper quadrant pain on palpation yesterday but not today  -HIDA scan ordered and normal hepatobiliary imaging with Normal GB Fxn  Elevated alkaline phosphatase but trending donw  -T bili, AST ALT, normal. -General surgery will evaluate. -Was NPO but will place on a 2 gram Sodium Diet now that Surgery has no concerns for Acute Cholecystis   High anion gap metabolic acidosis -Bicarb 20 and anion gap of 16 on 04/21/2019. -Now CO2 is 25, anion gap is 10, and chloride level is 103 -Have discontinued IV fluid but will repeat CMP in the morning  Hypomagnesemia Presented with magnesium of 0.5 -Magnesium this AM was 1.1 -Repleted with 4 g IV magnesium -Continue oral magnesium oxide 400 mg daily -Tinea to monitor and replete as necessary Repeat magnesium level in a.m.  Diarrhea with unclear etiology, present on admission. -C. difficile PCR negative -GI panel in process -Continue Florastor probiotic twice daily and Imodium 2 mg twice daily x 3 days -Discontinue IV fluid hydration given her history of liver cirrhosis -Patient stating that she had normal formed stool and is not as watery  Resolved concern for alcohol withdrawal -Presented with alcohol level less than 10. -Completed CIWA protocol -Continue multivitamins, thiamine and folic acid. -No sign of alcohol withdrawal at the time of this visit. -Has received alcohol cessation counseling at bedside.  Polysubstance abuse including cocaine and alcohol use disorder -Polysubstance abuse cessation counseled on at bedside -Last cocaine use was days ago -UDS positive for cocaine and benzodiazepine likely this to her antiseizure  AKI likely prerenal secondary to dehydration in the setting of diarrhea -Baseline  creatinine 1.1 with GFR greater than 60 -Presented with creatinine 1.4 and GFR 46; Now BUN/Cr is trending upwards and is 6/1.28 -Continue to avoid nephrotoxins -Continue to monitor urine output; will stop IV fluid hydration and BUN/creatinine is now 6/1.28 -Daily BMP  Ambulatory Dysfunction/Physical Debility -PT OT assessed and recommended home health PT OT -Continue activities with assistance and fall precautions -Case manager working on home health services for DC planning  Resolved Transient Rachycardia -EKG done on 04/17/2019 showed sinus tachycardia rate of 103 -Suspect related to her sepsis -HR now ranging from 79-96  Prolonged QTC -Avoid QTC prolonging agents if possible  -Continue to monitor electrolytes and replete as indicated -Repeat twelve-lead EKG yesterday showed a QTc of 445 -Repeat EKG in AM   Macrocytic Anemia -Likely related to the setting of alcoholism -The patient's hemoglobin/hematocrit is stable currently at 7.9/24.1 -Check anemia panel in a.m. next -Continue to monitor for signs and symptoms of bleeding; currently no overt bleeding noted  Liver Cirrhosis likely 2/2 to Alcoholism  -Seen on RUQ U/S as it showed "Mildly nodular hepatic contour, suggesting cirrhosis. No focal hepatic lesion is seen." -T Bili and LFTs normal   DVT prophylaxis: Enoxaparin 40 mg sq q24h Code Status: FULL CODE  Family Communication: No family present at bedside  Disposition Plan: [facility] when medically stable and Leukocytosis is improving .  Consultants:   General Surgery   Neurology  Procedures: HIDA  EEG 04/15/1985 IMPRESSION: This study is within normal limits. No seizures or epileptiform discharges  were seen throughout the recording.   Antimicrobials:  Anti-infectives (From admission, onward)   Start     Dose/Rate Route Frequency Ordered Stop   04/20/19 2030  piperacillin-tazobactam (ZOSYN) IVPB 3.375 g     3.375 g 12.5 mL/hr over 240 Minutes  Intravenous Every 8 hours 04/20/19 1339     04/20/19 1345  piperacillin-tazobactam (ZOSYN) IVPB 3.375 g     3.375 g 100 mL/hr over 30 Minutes Intravenous  Once 04/20/19 1339 04/20/19 1537   04/19/19 1500  cephALEXin (KEFLEX) capsule 500 mg  Status:  Discontinued     500 mg Oral Every 8 hours 04/19/19 1447 04/20/19 1335   04/17/19 1000  cefTRIAXone (ROCEPHIN) 1 g in sodium chloride 0.9 % 100 mL IVPB  Status:  Discontinued     1 g 200 mL/hr over 30 Minutes Intravenous Every 24 hours 04/17/19 0910 04/19/19 1447     Subjective: And examined at bedside and she was doing little bit better today and was asking for food.  Denies any right upper quadrant abdominal pain.  No nausea or vomiting.  Feels like her diarrhea is improving and slowly getting formed.  No other concerns or complaints at this time.  Objective: Vitals:   04/21/19 1930 04/21/19 2352 04/22/19 0327 04/22/19 0500  BP: 127/77 (!) 148/90 (!) 126/103   Pulse: 79 82 83   Resp: 18 19 20    Temp: 98.1 F (36.7 C) 99.4 F (37.4 C) 98.4 F (36.9 C)   TempSrc: Oral Oral Oral   SpO2: 98% 98% 98%   Weight:    59.1 kg  Height:        Intake/Output Summary (Last 24 hours) at 04/22/2019 [id number] Last data filed at 04/21/2019 1344 Gross per 24 hour  Intake 50 ml  Output -  Net 50 ml   Filed Weights   04/16/19 1527 04/22/19 0500  Weight: 59.3 kg 59.1 kg   Examination: Physical Exam:  Constitutional: WN/WD thin AAF in NAD and appears calm  Eyes: Lids and conjunctivae normal, sclerae anicteric  ENMT: External Ears, Nose appear normal. Grossly normal hearing.  Neck: Appears normal, supple, no cervical masses, normal ROM, no appreciable thyromegaly; no JVD Respiratory: Diminished to auscultation bilaterally, no wheezing, rales, rhonchi or crackles. Normal respiratory effort and patient is not tachypenic. No accessory muscle use. Unlabored breathing  Cardiovascular: RRR, no murmurs / rubs / gallops. S1 and S2 auscultated. No extremity  edema.  Abdomen: Soft, non-tender, non-distended. Bowel sounds positive x4.  GU: Deferred. Musculoskeletal: No clubbing / cyanosis of digits/nails. No joint deformity upper and lower extremities. .  Skin: No rashes, lesions, ulcers on a limited skin evaluation No induration; Warm and dry.  Neurologic: CN 2-12 grossly intact with no focal deficits. Romberg sign and cerebellar reflexes not assessed.  Psychiatric: Normal judgment and insight. Alert and oriented x 3. Normal mood and appropriate affect.   Data Reviewed: I have personally reviewed following labs and imaging studies  CBC: Recent Labs  Lab 04/16/19 0252 04/16/19 0255 04/16/19 1201 04/17/19 0324 04/20/19 0359 04/21/19 0602 04/22/19 0337  WBC 27.9*  --   --  19.5* 14.4* 18.2* 20.7*  NEUTROABS 21.8*  --   --   --   --  13.1* 15.3*  HGB 9.3* 11.6*  --  8.0* 8.1* 7.7* 7.9*  HCT 26.9* 34.0* 22.6* 23.1* 25.0* 23.2* 24.1*  MCV 101.1*  --   --  101.3* 107.8* 105.0* 103.9*  PLT 165  --   --  148* 270  287 [id number]   Basic Metabolic Panel: Recent Labs  Lab 04/16/19 0334 04/16/19 1159 04/17/19 0324 04/20/19 0359 04/20/19 1400 04/21/19 0602 04/22/19 0337  NA  --  135 137 139  --  139 138  K  --  4.2 3.9 3.9  --  3.9 4.1  CL  --  93* 101 106  --  103 103  CO2  --  27 24 25   --  20* 25  GLUCOSE  --  96 98 99  --  80 90  BUN  --  9 12 10   --  6* 6*  CREATININE  --  1.46* 1.41* 1.13*  --  1.17* 1.28*  CALCIUM  --  8.0* 7.4* 8.5*  --  8.9 9.0  MG 0.5* 1.1* 1.5*  --  1.2*  --  1.1*  PHOS  --  1.6*  --   --   --   --   --    GFR: Estimated Creatinine Clearance: 42.5 mL/min (A) (by C-G formula based on SCr of 1.28 mg/dL (H)). Liver Function Tests: Recent Labs  Lab 04/16/19 0252 04/16/19 1159 04/21/19 0602 04/22/19 0337  AST 76*  --  40 27  ALT 19  --  20 19  ALKPHOS 148*  --  145* 138*  BILITOT 1.2  --  0.4 0.7  PROT 7.7  --  6.2* 6.4*  ALBUMIN 3.9 3.4* 2.8* 2.7*   No results for input(s): LIPASE, AMYLASE in the last 168  hours. No results for input(s): AMMONIA in the last 168 hours. Coagulation Profile: Recent Labs  Lab 04/16/19 0252 04/21/19 1341  INR 1.2 1.2   Cardiac Enzymes: No results for input(s): CKTOTAL, CKMB, CKMBINDEX, TROPONINI in the last 168 hours. BNP (last 3 results) No results for input(s): PROBNP in the last 8760 hours. HbA1C: No results for input(s): HGBA1C in the last 72 hours. CBG: Recent Labs  Lab 04/16/19 0250  GLUCAP 146*   Lipid Profile: No results for input(s): CHOL, HDL, LDLCALC, TRIG, CHOLHDL, LDLDIRECT in the last 72 hours. Thyroid Function Tests: No results for input(s): TSH, T4TOTAL, FREET4, T3FREE, THYROIDAB in the last 72 hours. Anemia Panel: No results for input(s): VITAMINB12, FOLATE, FERRITIN, TIBC, IRON, RETICCTPCT in the last 72 hours. Sepsis Labs: Recent Labs  Lab 04/17/19 0620 04/20/19 0359  PROCALCITON 0.73 0.41  LATICACIDVEN 3.4* 1.9    Recent Results (from the past 240 hour(s))  SARS CORONAVIRUS 2 (TAT 6-24 HRS) Nasopharyngeal Nasopharyngeal Swab     Status: None   Collection Time: 04/16/19  5:40 AM   Specimen: Nasopharyngeal Swab  Result Value Ref Range Status   SARS Coronavirus 2 NEGATIVE NEGATIVE Final    Comment: (NOTE) SARS-CoV-2 target nucleic acids are NOT DETECTED. The SARS-CoV-2 RNA is generally detectable in upper and lower respiratory specimens during the acute phase of infection. Negative results do not preclude SARS-CoV-2 infection, do not rule out co-infections with other pathogens, and should not be used as the sole basis for treatment or other patient management decisions. Negative results must be combined with clinical observations, patient history, and epidemiological information. The expected result is Negative. Fact Sheet for Patients: SugarRoll.be Fact Sheet for Healthcare Providers: https://www.woods-mathews.com/ This test is not yet approved or cleared by the [place]  FDA and  has been authorized for detection and/or diagnosis of SARS-CoV-2 by FDA under an Emergency Use Authorization (EUA). This EUA will remain  in effect (meaning this test can be used) for the duration of the COVID-19 declaration  under Section 56 4(b)(1) of the Act, 21 U.S.C. section 360bbb-3(b)(1), unless the authorization is terminated or revoked sooner. Performed at [facility], [place] [address], [place], [place] [address]   Culture, Urine     Status: Abnormal   Collection Time: 04/17/19  5:51 AM   Specimen: Urine, Clean Catch  Result Value Ref Range Status   Specimen Description URINE, CLEAN CATCH  Final   Special Requests   Final    Normal Performed at [facility], [place] [address], [place], [place] [address]    Culture >=100,000 COLONIES/mL KLEBSIELLA PNEUMONIAE (A)  Final   Report Status 04/19/2019 FINAL  Final   Organism ID, Bacteria KLEBSIELLA PNEUMONIAE (A)  Final      Susceptibility   Klebsiella pneumoniae - MIC*    AMPICILLIN RESISTANT Resistant     CEFAZOLIN <=4 SENSITIVE Sensitive     CEFTRIAXONE <=1 SENSITIVE Sensitive     CIPROFLOXACIN <=0.25 SENSITIVE Sensitive     GENTAMICIN <=1 SENSITIVE Sensitive     IMIPENEM <=0.25 SENSITIVE Sensitive     NITROFURANTOIN 64 INTERMEDIATE Intermediate     TRIMETH/SULFA <=20 SENSITIVE Sensitive     AMPICILLIN/SULBACTAM 4 SENSITIVE Sensitive     PIP/TAZO <=4 SENSITIVE Sensitive     Extended ESBL NEGATIVE Sensitive     * >=100,000 COLONIES/mL KLEBSIELLA PNEUMONIAE  Culture, blood (routine x 2)     Status: None (Preliminary result)   Collection Time: 04/17/19  6:20 AM   Specimen: BLOOD  Result Value Ref Range Status   Specimen Description BLOOD RIGHT ARM  Final   Special Requests   Final    BOTTLES DRAWN AEROBIC AND ANAEROBIC Blood Culture adequate volume   Culture   Final    NO GROWTH 4 DAYS Performed at [facility], [address], [place], [place] [address]    Report Status PENDING  Incomplete   Culture, blood (routine x 2)     Status: None (Preliminary result)   Collection Time: 04/17/19  6:20 AM   Specimen: BLOOD  Result Value Ref Range Status   Specimen Description BLOOD RIGHT ARM  Final   Special Requests   Final    BOTTLES DRAWN AEROBIC ONLY Blood Culture adequate volume   Culture   Final    NO GROWTH 4 DAYS Performed at [facility], [place] [address], [place], [place] [address]    Report Status PENDING  Incomplete  C difficile quick scan w PCR reflex     Status: None   Collection Time: 04/17/19 12:26 PM   Specimen: Rectum; Stool  Result Value Ref Range Status   C Diff antigen NEGATIVE NEGATIVE Final   C Diff toxin NEGATIVE NEGATIVE Final   C Diff interpretation No C. difficile detected.  Final    Comment: Performed at [facility], [place] [address], [place], [place] [address]     Radiology Studies: US Abdomen Complete  Result Date: 04/20/2019 CLINICAL DATA:  Cirrhosis EXAM: ABDOMEN ULTRASOUND COMPLETE COMPARISON:  None. FINDINGS: Gallbladder: Multiple layering gallstones, including a 12 mm stone in the gallbladder neck. Associated gallbladder wall thickening/pericholecystic fluid. Common bile duct: Diameter: 4 mm Liver: No focal lesion identified. Within normal limits in parenchymal echogenicity. Portal vein is patent on color Doppler imaging with normal direction of blood flow towards the liver. IVC: No abnormality visualized. Pancreas: Visualized portion unremarkable. Spleen: Size and appearance within normal limits. Right Kidney: Length: 12.8 cm. Echogenicity within normal limits. No mass or hydronephrosis  visualized. Left Kidney: Length: 11.5 cm. Echogenicity within normal limits. No mass or hydronephrosis visualized. Abdominal aorta: No aneurysm visualized. Other findings: None. IMPRESSION: Cholelithiasis with gallbladder wall thickening/pericholecystic fluid, concerning for acute cholecystitis. Correlate for right upper quadrant pain. If clinically warranted,  consider hepatobiliary nuclear medicine scan for confirmation. Mildly nodular hepatic contour, suggesting cirrhosis. No focal hepatic lesion is seen. These results will be called to the ordering clinician or representative by the Radiologist Assistant, and communication documented in the PACS or zVision Dashboard. Electronically Signed   By: Julian Hy M.D.   On: 04/20/2019 20:59   Nm Hepato W/eject Fract  Result Date: 04/21/2019 CLINICAL DATA:  Known cholelithiasis.  History of cirrhosis. EXAM: NUCLEAR MEDICINE HEPATOBILIARY IMAGING WITH GALLBLADDER EF TECHNIQUE: Sequential images of the abdomen were obtained out to 60 minutes following intravenous administration of radiopharmaceutical. After oral ingestion of Ensure, gallbladder ejection fraction was determined. At 60 min, normal ejection fraction is greater than 33%. RADIOPHARMACEUTICALS:  5.0 mCi Tc-11m  Choletec IV COMPARISON:  Abdomen ultrasound dated 04/20/2019. FINDINGS: Prompt uptake and biliary excretion of activity by the liver is seen. Gallbladder activity is visualized, consistent with patency of cystic duct. Biliary activity passes into small bowel, consistent with patent common bile duct. Calculated gallbladder ejection fraction is 72%. (Normal gallbladder ejection fraction with Ensure is greater than 33%.) IMPRESSION: Normal hepatobiliary imaging with normal gallbladder function. Electronically Signed   By: Lajean Manes M.D.   On: 04/21/2019 13:53   Scheduled Meds: . enoxaparin (LOVENOX) injection  40 mg Subcutaneous Q24H  . folic acid  1 mg Oral Daily  . magnesium oxide  400 mg Oral Daily  . multivitamin with minerals  1 tablet Oral Daily  . potassium chloride SA  20 mEq Oral Daily  . saccharomyces boulardii  250 mg Oral BID  . thiamine  100 mg Oral Daily   Or  . thiamine  100 mg Intravenous Daily   Continuous Infusions: . lactated ringers 75 mL/hr at 04/19/19 2030  . magnesium sulfate bolus IVPB    .  piperacillin-tazobactam (ZOSYN)  IV 3.375 g (04/22/19 0440)    LOS: 5 days   Kerney Elbe, DO [facility] PAGER is on AMION  If 7PM-7AM, please contact night-coverage [email or web address]

## 2019-04-23 ENCOUNTER — Inpatient Hospital Stay (HOSPITAL_COMMUNITY): Payer: Self-pay

## 2019-04-23 DIAGNOSIS — D72829 Elevated white blood cell count, unspecified: Secondary | ICD-10-CM

## 2019-04-23 DIAGNOSIS — I340 Nonrheumatic mitral (valve) insufficiency: Secondary | ICD-10-CM

## 2019-04-23 LAB — COMPREHENSIVE METABOLIC PANEL
ALT: 18 U/L (ref 0–44)
AST: 30 U/L (ref 15–41)
Albumin: 2.6 g/dL — ABNORMAL LOW (ref 3.5–5.0)
Alkaline Phosphatase: 150 U/L — ABNORMAL HIGH (ref 38–126)
Anion gap: 9 (ref 5–15)
BUN: 8 mg/dL (ref 8–23)
CO2: 25 mmol/L (ref 22–32)
Calcium: 9.1 mg/dL (ref 8.9–10.3)
Chloride: 102 mmol/L (ref 98–111)
Creatinine, Ser: 1.48 mg/dL — ABNORMAL HIGH (ref 0.44–1.00)
GFR calc Af Amer: 44 mL/min — ABNORMAL LOW (ref >60)
GFR calc non Af Amer: 38 mL/min — ABNORMAL LOW (ref >60)
Glucose, Bld: 129 mg/dL — ABNORMAL HIGH (ref 70–99)
Potassium: 3.9 mmol/L (ref 3.5–5.1)
Sodium: 136 mmol/L (ref 135–145)
Total Bilirubin: 0.4 mg/dL (ref 0.3–1.2)
Total Protein: 6.2 g/dL — ABNORMAL LOW (ref 6.5–8.1)

## 2019-04-23 LAB — IRON AND TIBC
Iron: 20 ug/dL — ABNORMAL LOW (ref 28–170)
Saturation Ratios: 11 % (ref 10.4–31.8)
TIBC: 179 ug/dL — ABNORMAL LOW (ref 250–450)
UIBC: 159 ug/dL

## 2019-04-23 LAB — PHOSPHORUS: Phosphorus: <1 mg/dL — CL (ref 2.5–4.6)

## 2019-04-23 LAB — CBC WITH DIFFERENTIAL/PLATELET
Abs Immature Granulocytes: 0.23 10*3/uL — ABNORMAL HIGH (ref 0.00–0.07)
Basophils Absolute: 0.1 10*3/uL (ref 0.0–0.1)
Basophils Relative: 1 %
Eosinophils Absolute: 0.6 10*3/uL — ABNORMAL HIGH (ref 0.0–0.5)
Eosinophils Relative: 2 %
HCT: 23.9 % — ABNORMAL LOW (ref 36.0–46.0)
Hemoglobin: 7.9 g/dL — ABNORMAL LOW (ref 12.0–15.0)
Immature Granulocytes: 1 %
Lymphocytes Relative: 7 %
Lymphs Abs: 1.6 10*3/uL (ref 0.7–4.0)
MCH: 34.3 pg — ABNORMAL HIGH (ref 26.0–34.0)
MCHC: 33.1 g/dL (ref 30.0–36.0)
MCV: 103.9 fL — ABNORMAL HIGH (ref 80.0–100.0)
Monocytes Absolute: 2 10*3/uL — ABNORMAL HIGH (ref 0.1–1.0)
Monocytes Relative: 8 %
Neutro Abs: 19.9 10*3/uL — ABNORMAL HIGH (ref 1.7–7.7)
Neutrophils Relative %: 81 %
Platelets: 356 10*3/uL (ref 150–400)
RBC: 2.3 MIL/uL — ABNORMAL LOW (ref 3.87–5.11)
RDW: 16.1 % — ABNORMAL HIGH (ref 11.5–15.5)
WBC: 24.4 10*3/uL — ABNORMAL HIGH (ref 4.0–10.5)
nRBC: 0 % (ref 0.0–0.2)

## 2019-04-23 LAB — URINALYSIS, COMPLETE (UACMP) WITH MICROSCOPIC
Bilirubin Urine: NEGATIVE
Glucose, UA: NEGATIVE mg/dL
Hgb urine dipstick: NEGATIVE
Ketones, ur: NEGATIVE mg/dL
Nitrite: NEGATIVE
Protein, ur: NEGATIVE mg/dL
Specific Gravity, Urine: 1.013 (ref 1.005–1.030)
WBC, UA: >50 WBC/hpf — ABNORMAL HIGH (ref 0–5)
pH: 7 (ref 5.0–8.0)

## 2019-04-23 LAB — RETICULOCYTES
Immature Retic Fract: 26.6 % — ABNORMAL HIGH (ref 2.3–15.9)
RBC.: 2.3 MIL/uL — ABNORMAL LOW (ref 3.87–5.11)
Retic Count, Absolute: 52.4 10*3/uL (ref 19.0–186.0)
Retic Ct Pct: 2.3 % (ref 0.4–3.1)

## 2019-04-23 LAB — FOLATE: Folate: 19.3 ng/mL (ref >5.9)

## 2019-04-23 LAB — VITAMIN B12: Vitamin B-12: 536 pg/mL (ref 180–914)

## 2019-04-23 LAB — PROCALCITONIN: Procalcitonin: 0.54 ng/mL

## 2019-04-23 LAB — FERRITIN: Ferritin: 535 ng/mL — ABNORMAL HIGH (ref 11–307)

## 2019-04-23 LAB — MAGNESIUM: Magnesium: 1.9 mg/dL (ref 1.7–2.4)

## 2019-04-23 MED ORDER — SODIUM CHLORIDE 0.9 % IV BOLUS
500.0000 mL | Freq: Once | INTRAVENOUS | Status: AC
  Administered 2019-04-23: 500 mL via INTRAVENOUS

## 2019-04-23 MED ORDER — SODIUM CHLORIDE 0.9 % IV SOLN
INTRAVENOUS | Status: DC
  Administered 2019-04-23 – 2019-04-26 (×4): via INTRAVENOUS

## 2019-04-23 MED ORDER — SODIUM PHOSPHATES 45 MMOLE/15ML IV SOLN
30.0000 mmol | Freq: Once | INTRAVENOUS | Status: AC
  Administered 2019-04-23: 30 mmol via INTRAVENOUS
  Filled 2019-04-23: qty 10

## 2019-04-23 NOTE — Progress Notes (Signed)
Attempted to obtain urine specimen x2, however both specimens contaminated by liquid/soft stool. Will continue to attempt collection for clean specimen.

## 2019-04-23 NOTE — Progress Notes (Addendum)
PROGRESS NOTE    Christie Morrison  [id number] DOB: November 22, 1955 DOA: 04/16/2019 PCP: Gildardo Pounds, NP  Brief Narrative:  HPI per Dr. Myrene Buddy on 04/16/2019 Christie Morrison is a 63 y.o. F with hx of alcoholic cirrhosis, ongoing alcohol use, chronic hypomagnesemia and hypokalemia, and alcohol-related seizure in May of this year who presents with seizure.  Patient is very sleepy historian, but she relates feeling in her normal state of health until this evening.  Per report from ER triage, patient was at home tonight when roommate heard her fall in the bathroom, arrived to find her posturing, incontinent of stool.  EMS arrived and found her confused and disoriented.  In the ER, patient was tachycardic, blood pressure slightly elevated.  Initially called "code stroke" because of aphasia, but this was subsequently canceled as it was thought to be due to postictal state.  Sodium 133, potassium 2.9, magnesium 0.5, calcium 7.6, creatinine 1.6 (up from baseline 0.8), mild transaminitis, leukocytosis, macrocytic anemia.  Anion gap 25, CT head unremarkable.  She was given IV magnesium, IV calcium, IV potassium, and started on Keppra.  Neurology were consulted and the hospital service was asked to admit for severe electrolyte derangements and seizure.  **Interim History Patient admitted to having regular liquor consumption and stated that she drank the day before she presented.  She had an elevated WBC which was trending down but now trending back up and worsening.  I discussed the case with Dr. Irene Limbo of hematology who feels this may be a leukemoid reaction and recommends pan scan the patient if he continues to worsen and checking an echocardiogram as well as extremity duplex to rule out DVT.  She is completed her CIWA protocol Librium multivitamins and folic acid.  On 04/18/2019 she developed persistent diarrhea and C. difficile PCR was negative.  Urine culture showed Klebsiella UTI and  antibiotics for increased to IV Zosyn and will continue for 1 more days she will have 7 days of antibiotic treatment and stop in a.m.  There was concern for acalculous cholecystitis given her abdominal ultrasound but with a negative HIDA scan and general surgery evaluation did not feel that this is the cause of her worsening white count.  Her HIDA scan was normal.  Diet was advanced to 2 g sodium diet given her history of cirrhosis.  She started back on IV fluids today given her slight hypotension because of her worsening renal function  Assessment & Plan:   Principal Problem:   Seizures (HCC) Active Problems:   Cocaine abuse (HCC)   Cirrhosis, alcoholic (HCC)   Hypokalemia   Hypocalcemia   Hypomagnesemia   Prolonged QT interval   Macrocytic anemia   Hyponatremia   AKI (acute kidney injury) ([place])   Seizure (HCC)   Klebsiella infection  Alcohol-related seizure suspect provoked by alcohol withdrawal and electrolytes imbalance -Presented with alcohol level less than 10 and significant electrolyte abnormalities -Had a witnessed seizure at home by her son after decrease amount of alcohol intake -Seen by neurology, EEG no sign of seizure activity -No indication for seizure treatment at this time -Completed CIWA protocol.   -No evidence of alcohol withdrawal at this time.   -Continue multivitamin, thiamine and folic acid.   -Continue to replete electrolytes as indicated.  Sepsis secondary to klebsiella pneumoniae UTI and ? calculus cholecystitis versus other etiology -Presented with leukocytosis with WBC of 27,000, tachycardia with UA suggestive of UTI obtained on 04/17/2019. -Urine culture taken on 04/17/2019 grew greater than  100,000 colonies of Klebsiella pneumoniae.   -Sensitivities show resistance to nitrofurantoin and ampicillin. -Completed 3 days of Rocephin and Switched to Keflex 500 mg 3 times daily x5 days on 04/19/19 but had Persistent watery stool reported.  C. difficile PCR  negative on 04/17/2019.  GI panel by PCR in process still -Fever with T-max 100.5 on 04/20/2019.  Abdominal ultrasound suggestive of acute calculous cholecystitis. -PCT was 0.41 likely elevated 0.54 -Started on IV Zosyn empirically on 04/20/2019 instead given concern for Acute Calculous Cholecystitis and will continue it until today and stop tomorrow -HIDA scan obtained on 04/21/2019 and showed "Normal hepatobiliary imaging with normal gallbladder function.General surgery consulted on 04/21/2019." -Leukocytosis is worsening and now went from 14.4 -> 18.2 -> 20.7 ->24.4; ? Related to Diarrheal process but could be a leukemoid reaction and will panculture the patient -Stopped  IV fluids yesterday but will resume today; C/w IV antibiotics for today -Continue to monitor fever curve and WBC -Repeat CBC in the morning -If WBC still worsening will pan scan the patient to evaluate for any hidden abscess such as diverticular abscess or to evaluate for cell Sialadenitis and will evaluate the patient's mouth tomorrow; has not been febrile but T-max 99.7 last 24 hours -We will need to rule out other etiologies of elevated WBC and will obtain an echocardiogram to rule out endocarditis and lower extremity Dopplers to rule out DVT  Leukocytosis -Worsening -See above -Panculture and will obtain an echocardiogram and lower extremity duplex to rule out DVT -If continues to worsen will pan scan the patient as there is no clear etiology  ? Acute calculous cholecystitis -Management as stated above -Right upper quadrant pain on palpation the day before yesterday but not yesterday.  Complaining of crampy abdominal pain today -HIDA scan ordered and normal hepatobiliary imaging with Normal GB Fxn  Elevated alkaline phosphatase but trending donw  -T bili, AST ALT, normal. -General surgery will evaluate and feel that this is nonsurgical. -Was NPO but will place on a 2 gram Sodium Diet now that Surgery has no concerns  for Acute Cholecystis   High anion gap metabolic acidosis -Bicarb 20 and anion gap of 16 on 04/21/2019. -Now CO2 is 25, anion gap is 9, chloride levels 102 -Initially had discontinued IV fluids but had resumed and given 500 and will bolus given her slight hypotension  Hypomagnesemia Presented with magnesium of 0.5 -Magnesium this AM was 1.9 -Repleted with 4 g IV magnesium yesterday -Continue oral magnesium oxide 400 mg daily -Tinea to monitor and replete as necessary Repeat magnesium level in a.m.  Diarrhea with unclear etiology, present on admission. -C. difficile PCR negative -GI panel in process still -Continue Florastor probiotic twice daily and Imodium 2 mg twice daily x 3 days -Resume gentle IV fluid hydration today given her worsening renal function -Patient stating that she had normal formed stool and is not as watery  Resolved concern for alcohol withdrawal -Presented with alcohol level less than 10. -Completed CIWA protocol -Continue multivitamins, thiamine and folic acid. -No sign of alcohol withdrawal at the time of this visit. -Has received alcohol cessation counseling at bedside.  Polysubstance abuse including cocaine and alcohol use disorder -Polysubstance abuse cessation counseled on at bedside -Last cocaine use was days ago -UDS positive for cocaine and benzodiazepine likely this to her antiseizure  AKI likely prerenal secondary to dehydration in the setting of diarrhea -Baseline creatinine 1.1 with GFR greater than 60 -Presented with creatinine 1.4 and GFR 46; Now BUN/Cr is trending  upwards and is worse today and is 8/1.48 -Continue to avoid nephrotoxins -Continue to monitor urine output;  -Start IV fluid hydration yesterday but will restart today -Daily BMP  Ambulatory Dysfunction/Physical Debility -PT OT assessed and recommended home health PT OT -Continue activities with assistance and fall precautions -Case manager working on home health  services for DC planning  Resolved Transient Rachycardia -EKG done on 04/17/2019 showed sinus tachycardia rate of 103 -Suspect related to her sepsis -HR now ranging from 79-96  Prolonged QTC -Avoid QTC prolonging agents if possible  -Continue to monitor electrolytes and replete as indicated -Repeat twelve-lead EKG on 04/21/2019 showed a QTc of 445 -Repeat EKG in AM   Macrocytic Anemia -Likely related to the setting of alcoholism -The patient's hemoglobin/hematocrit is stable currently at 7.9/23.9 -Check anemia panel and showed an iron level of 20, U IBC of 159, TIBC 179, saturation ratios of 11%, ferritin level 535, folate level of 19.3, and vitamin B12 of 536 -Continue to monitor for signs and symptoms of bleeding; currently no overt bleeding noted  Liver Cirrhosis likely 2/2 to Alcoholism  -Seen on RUQ U/S as it showed "Mildly nodular hepatic contour, suggesting cirrhosis. No focal hepatic lesion is seen." -T Bili and LFTs normal   Hypophosphatemia -Patient's phosphorus level this morning was less than 1.0 -Replete with 30 mmol of sodium phosphate IV -Continue monitor and replete as necessary Repeat phosphorus level in a.m.  DVT prophylaxis: Enoxaparin 40 mg sq q24h Code Status: FULL CODE  Family Communication: No family present at bedside  Disposition Plan: Home Health PT when medically stable and Leukocytosis is improving and evaluated for this leukemoid reaction.  Consultants:   General Surgery   Neurology  Procedures: HIDA  EEG 04/15/1985 IMPRESSION: This study is within normal limits. No seizures or epileptiform discharges were seen throughout the recording.   Antimicrobials:  Anti-infectives (From admission, onward)   Start     Dose/Rate Route Frequency Ordered Stop   04/20/19 2030  piperacillin-tazobactam (ZOSYN) IVPB 3.375 g     3.375 g 12.5 mL/hr over 240 Minutes Intravenous Every 8 hours 04/20/19 1339     04/20/19 1345  piperacillin-tazobactam (ZOSYN)  IVPB 3.375 g     3.375 g 100 mL/hr over 30 Minutes Intravenous  Once 04/20/19 1339 04/20/19 1537   04/19/19 1500  cephALEXin (KEFLEX) capsule 500 mg  Status:  Discontinued     500 mg Oral Every 8 hours 04/19/19 1447 04/20/19 1335   04/17/19 1000  cefTRIAXone (ROCEPHIN) 1 g in sodium chloride 0.9 % 100 mL IVPB  Status:  Discontinued     1 g 200 mL/hr over 30 Minutes Intravenous Every 24 hours 04/17/19 0910 04/19/19 1447     Subjective: Seen and examined at bedside states that her diarrhea was "compromising".  States that she did have some crampy abdominal pain.  Wanting to sleep.  No nausea or vomiting currently.  Denies any burning or discomfort in her urine and denies any cough.  No other concerns or complaints at this time.  Objective: Vitals:   04/23/19 0814 04/23/19 1230 04/23/19 1237 04/23/19 1720  BP: 139/81 (!) 90/59 (!) 91/50 (!) 145/83  Pulse: 83 83 80 83  Resp: 17 16  (!) 24  Temp: 98.2 F (36.8 C) 99.7 F (37.6 C)  98.4 F (36.9 C)  TempSrc: Oral Oral  Oral  SpO2:    99%  Weight:      Height:        Intake/Output Summary (Last 24  hours) at 04/23/2019 1839 Last data filed at 04/23/2019 1425 Gross per 24 hour  Intake 914.14 ml  Output -  Net 914.14 ml   Filed Weights   04/16/19 1527 04/22/19 0500  Weight: 59.3 kg 59.1 kg   Examination: Physical Exam:  Constitutional: WN/WD thin female currently no acute distress appears calm and wanting to sleep Eyes: Lids and conjunctivae normal, sclerae anicteric  ENMT: External Ears, Nose appear normal. Grossly normal hearing.  Neck: Appears normal, supple, no cervical masses, normal ROM, no appreciable thyromegaly; no JVD Respiratory: Mildly diminished to auscultation bilaterally, no wheezing, rales, rhonchi or crackles.  Slight tachypnea. No accessory muscle use.  Unlabored breathing Cardiovascular: RRR, no murmurs / rubs / gallops. S1 and S2 auscultated. No extremity edema. 2+ pedal pulses. No carotid bruits.  Abdomen:  Soft, minimally tender, non-distended. No masses palpated.  Bowel sounds positive x4.  GU: Deferred. Musculoskeletal: No clubbing / cyanosis of digits/nails. No joint deformity upper and lower extremities. Skin: No rashes, lesions, ulcers or limited skin evaluation. No induration; Warm and dry.  Neurologic: CN 2-12 grossly intact with no focal deficits. Romberg sign and cerebellar reflexes not assessed.  Psychiatric: Normal judgment and insight. Alert and oriented x 3. Normal mood and appropriate affect.   Data Reviewed: I have personally reviewed following labs and imaging studies  CBC: Recent Labs  Lab 04/17/19 0324 04/20/19 0359 04/21/19 0602 04/22/19 0337 04/23/19 0416  WBC 19.5* 14.4* 18.2* 20.7* 24.4*  NEUTROABS  --   --  13.1* 15.3* 19.9*  HGB 8.0* 8.1* 7.7* 7.9* 7.9*  HCT 23.1* 25.0* 23.2* 24.1* 23.9*  MCV 101.3* 107.8* 105.0* 103.9* 103.9*  PLT 148* 270 287 317 [id number]   Basic Metabolic Panel: Recent Labs  Lab 04/17/19 0324 04/20/19 0359 04/20/19 1400 04/21/19 0602 04/22/19 0337 04/23/19 0416  NA 137 139  --  139 138 136  K 3.9 3.9  --  3.9 4.1 3.9  CL 101 106  --  103 103 102  CO2 24 25  --  20* 25 25  GLUCOSE 98 99  --  80 90 129*  BUN 12 10  --  6* 6* 8  CREATININE 1.41* 1.13*  --  1.17* 1.28* 1.48*  CALCIUM 7.4* 8.5*  --  8.9 9.0 9.1  MG 1.5*  --  1.2*  --  1.1* 1.9  PHOS  --   --   --   --   --  <1.0*   GFR: Estimated Creatinine Clearance: 36.8 mL/min (A) (by C-G formula based on SCr of 1.48 mg/dL (H)). Liver Function Tests: Recent Labs  Lab 04/21/19 0602 04/22/19 0337 04/23/19 0416  AST 40 27 30  ALT 20 19 18   ALKPHOS 145* 138* 150*  BILITOT 0.4 0.7 0.4  PROT 6.2* 6.4* 6.2*  ALBUMIN 2.8* 2.7* 2.6*   No results for input(s): LIPASE, AMYLASE in the last 168 hours. No results for input(s): AMMONIA in the last 168 hours. Coagulation Profile: Recent Labs  Lab 04/21/19 1341  INR 1.2   Cardiac Enzymes: No results for input(s): CKTOTAL, CKMB,  CKMBINDEX, TROPONINI in the last 168 hours. BNP (last 3 results) No results for input(s): PROBNP in the last 8760 hours. HbA1C: No results for input(s): HGBA1C in the last 72 hours. CBG: No results for input(s): GLUCAP in the last 168 hours. Lipid Profile: No results for input(s): CHOL, HDL, LDLCALC, TRIG, CHOLHDL, LDLDIRECT in the last 72 hours. Thyroid Function Tests: No results for input(s): TSH, T4TOTAL, FREET4, T3FREE, THYROIDAB  in the last 72 hours. Anemia Panel: Recent Labs    04/23/19 0416 04/23/19 0422  VITAMINB12 536  --   FOLATE  --  19.3  FERRITIN 535*  --   TIBC 179*  --   IRON 20*  --   RETICCTPCT 2.3  --    Sepsis Labs: Recent Labs  Lab 04/17/19 0620 04/20/19 0359 04/23/19 0416  PROCALCITON 0.73 0.41 0.54  LATICACIDVEN 3.4* 1.9  --     Recent Results (from the past 240 hour(s))  SARS CORONAVIRUS 2 (TAT 6-24 HRS) Nasopharyngeal Nasopharyngeal Swab     Status: None   Collection Time: 04/16/19  5:40 AM   Specimen: Nasopharyngeal Swab  Result Value Ref Range Status   SARS Coronavirus 2 NEGATIVE NEGATIVE Final    Comment: (NOTE) SARS-CoV-2 target nucleic acids are NOT DETECTED. The SARS-CoV-2 RNA is generally detectable in upper and lower respiratory specimens during the acute phase of infection. Negative results do not preclude SARS-CoV-2 infection, do not rule out co-infections with other pathogens, and should not be used as the sole basis for treatment or other patient management decisions. Negative results must be combined with clinical observations, patient history, and epidemiological information. The expected result is Negative. Fact Sheet for Patients: SugarRoll.be Fact Sheet for Healthcare Providers: https://www.woods-mathews.com/ This test is not yet approved or cleared by the [place] FDA and  has been authorized for detection and/or diagnosis of SARS-CoV-2 by FDA under an Emergency Use  Authorization (EUA). This EUA will remain  in effect (meaning this test can be used) for the duration of the COVID-19 declaration under Section 56 4(b)(1) of the Act, 21 U.S.C. section 360bbb-3(b)(1), unless the authorization is terminated or revoked sooner. Performed at [facility], [place] [address], [place], [place] [address]   Culture, Urine     Status: Abnormal   Collection Time: 04/17/19  5:51 AM   Specimen: Urine, Clean Catch  Result Value Ref Range Status   Specimen Description URINE, CLEAN CATCH  Final   Special Requests   Final    Normal Performed at [facility], [place] [address], [place], [place] [address]    Culture >=100,000 COLONIES/mL KLEBSIELLA PNEUMONIAE (A)  Final   Report Status 04/19/2019 FINAL  Final   Organism ID, Bacteria KLEBSIELLA PNEUMONIAE (A)  Final      Susceptibility   Klebsiella pneumoniae - MIC*    AMPICILLIN RESISTANT Resistant     CEFAZOLIN <=4 SENSITIVE Sensitive     CEFTRIAXONE <=1 SENSITIVE Sensitive     CIPROFLOXACIN <=0.25 SENSITIVE Sensitive     GENTAMICIN <=1 SENSITIVE Sensitive     IMIPENEM <=0.25 SENSITIVE Sensitive     NITROFURANTOIN 64 INTERMEDIATE Intermediate     TRIMETH/SULFA <=20 SENSITIVE Sensitive     AMPICILLIN/SULBACTAM 4 SENSITIVE Sensitive     PIP/TAZO <=4 SENSITIVE Sensitive     Extended ESBL NEGATIVE Sensitive     * >=100,000 COLONIES/mL KLEBSIELLA PNEUMONIAE  Culture, blood (routine x 2)     Status: None   Collection Time: 04/17/19  6:20 AM   Specimen: BLOOD  Result Value Ref Range Status   Specimen Description BLOOD RIGHT ARM  Final   Special Requests   Final    BOTTLES DRAWN AEROBIC AND ANAEROBIC Blood Culture adequate volume   Culture   Final    NO GROWTH 5 DAYS Performed at [facility], [address] [place], [place] [address]    Report Status 04/22/2019 FINAL  Final  Culture,  blood (routine x 2)     Status: None   Collection Time: 04/17/19  6:20 AM   Specimen: BLOOD  Result Value Ref  Range Status   Specimen Description BLOOD RIGHT ARM  Final   Special Requests   Final    BOTTLES DRAWN AEROBIC ONLY Blood Culture adequate volume   Culture   Final    NO GROWTH 5 DAYS Performed at [facility], [address], [place], [place] [address]    Report Status 04/22/2019 FINAL  Final  C difficile quick scan w PCR reflex     Status: None   Collection Time: 04/17/19 12:26 PM   Specimen: Rectum; Stool  Result Value Ref Range Status   C Diff antigen NEGATIVE NEGATIVE Final   C Diff toxin NEGATIVE NEGATIVE Final   C Diff interpretation No C. difficile detected.  Final    Comment: Performed at [facility], [place] [address], [place], [place] [address]     Radiology Studies: Dg Chest Port 1 View  Result Date: 04/23/2019 CLINICAL DATA:  Leukocytosis.  Generalized weakness. EXAM: PORTABLE CHEST 1 VIEW COMPARISON:  04/17/2019 FINDINGS: Lungs are adequately inflated and otherwise clear. Cardiomediastinal silhouette and remainder of the exam is unchanged. IMPRESSION: No active disease. Electronically Signed   By: Marin Olp M.D.   On: 04/23/2019 09:12   Vas [place] Lower Extremity Venous (dvt)  Result Date: 04/23/2019  Lower Venous Study Indications: Leukocytosis.  Comparison Study: No prior study. Performing Technologist: Maudry Mayhew MHA, RDMS, RVT, RDCS  Examination Guidelines: A complete evaluation includes B-mode imaging, spectral Doppler, color Doppler, and power Doppler as needed of all accessible portions of each vessel. Bilateral testing is considered an integral part of a complete examination. Limited examinations for reoccurring indications may be performed as noted.  +---------+---------------+---------+-----------+----------+--------------+ RIGHT    CompressibilityPhasicitySpontaneityPropertiesThrombus Aging +---------+---------------+---------+-----------+----------+--------------+ CFV      Full           Yes      Yes                                  +---------+---------------+---------+-----------+----------+--------------+ SFJ      Full                                                        +---------+---------------+---------+-----------+----------+--------------+ FV Prox  Full                                                        +---------+---------------+---------+-----------+----------+--------------+ FV Mid   Full                                                        +---------+---------------+---------+-----------+----------+--------------+ FV DistalFull                                                        +---------+---------------+---------+-----------+----------+--------------+  PFV      Full                                                        +---------+---------------+---------+-----------+----------+--------------+ POP      Full           Yes      Yes                                 +---------+---------------+---------+-----------+----------+--------------+ PTV      Full                                                        +---------+---------------+---------+-----------+----------+--------------+ PERO     Full                                                        +---------+---------------+---------+-----------+----------+--------------+   +---------+---------------+---------+-----------+----------+--------------+ LEFT     CompressibilityPhasicitySpontaneityPropertiesThrombus Aging +---------+---------------+---------+-----------+----------+--------------+ CFV      Full           Yes      Yes                                 +---------+---------------+---------+-----------+----------+--------------+ SFJ      Full                                                        +---------+---------------+---------+-----------+----------+--------------+ FV Prox  Full                                                         +---------+---------------+---------+-----------+----------+--------------+ FV Mid   Full                                                        +---------+---------------+---------+-----------+----------+--------------+ FV DistalFull                                                        +---------+---------------+---------+-----------+----------+--------------+ PFV      Full                                                        +---------+---------------+---------+-----------+----------+--------------+  POP      Full           Yes      Yes                                 +---------+---------------+---------+-----------+----------+--------------+ PTV      Full                                                        +---------+---------------+---------+-----------+----------+--------------+ PERO     Full                                                        +---------+---------------+---------+-----------+----------+--------------+     Summary: Right: There is no evidence of deep vein thrombosis in the lower extremity. No cystic structure found in the popliteal fossa. Left: There is no evidence of deep vein thrombosis in the lower extremity. A cystic structure is found in the popliteal fossa.  *See table(s) above for measurements and observations. Electronically signed by Servando Snare MD on 04/23/2019 at 6:12:30 PM.    Final    Scheduled Meds: . enoxaparin (LOVENOX) injection  40 mg Subcutaneous Q24H  . folic acid  1 mg Oral Daily  . magnesium oxide  400 mg Oral Daily  . multivitamin with minerals  1 tablet Oral Daily  . potassium chloride SA  20 mEq Oral Daily  . saccharomyces boulardii  250 mg Oral BID  . thiamine  100 mg Oral Daily   Or  . thiamine  100 mg Intravenous Daily   Continuous Infusions: . sodium chloride 75 mL/hr at 04/23/19 1425  . piperacillin-tazobactam (ZOSYN)  IV 12.5 mL/hr at 04/23/19 1425    LOS: 6 days   Kerney Elbe, DO [facility] PAGER is on [place]  If 7PM-7AM, please contact night-coverage [email or web address]

## 2019-04-23 NOTE — Progress Notes (Signed)
  Echocardiogram 2D Echocardiogram has been performed.  Christie Morrison 04/23/2019, 4:08 PM

## 2019-04-23 NOTE — Progress Notes (Signed)
Bilateral lower extremity venous duplex completed. Refer to "CV Proc" under chart review to view preliminary results.  04/23/2019 3:17 PM Kelby Aline., MHA, RVT, RDCS, RDMS

## 2019-04-24 DIAGNOSIS — R569 Unspecified convulsions: Secondary | ICD-10-CM

## 2019-04-24 DIAGNOSIS — A498 Other bacterial infections of unspecified site: Secondary | ICD-10-CM

## 2019-04-24 DIAGNOSIS — D539 Nutritional anemia, unspecified: Secondary | ICD-10-CM

## 2019-04-24 DIAGNOSIS — D72829 Elevated white blood cell count, unspecified: Secondary | ICD-10-CM

## 2019-04-24 DIAGNOSIS — F1020 Alcohol dependence, uncomplicated: Secondary | ICD-10-CM

## 2019-04-24 DIAGNOSIS — N3941 Urge incontinence: Secondary | ICD-10-CM

## 2019-04-24 DIAGNOSIS — K7030 Alcoholic cirrhosis of liver without ascites: Secondary | ICD-10-CM

## 2019-04-24 DIAGNOSIS — R509 Fever, unspecified: Secondary | ICD-10-CM

## 2019-04-24 DIAGNOSIS — E876 Hypokalemia: Secondary | ICD-10-CM

## 2019-04-24 DIAGNOSIS — K529 Noninfective gastroenteritis and colitis, unspecified: Secondary | ICD-10-CM

## 2019-04-24 LAB — MAGNESIUM: Magnesium: 1.5 mg/dL — ABNORMAL LOW (ref 1.7–2.4)

## 2019-04-24 LAB — CBC WITH DIFFERENTIAL/PLATELET
Abs Immature Granulocytes: 0.26 10*3/uL — ABNORMAL HIGH (ref 0.00–0.07)
Basophils Absolute: 0.1 10*3/uL (ref 0.0–0.1)
Basophils Relative: 0 %
Eosinophils Absolute: 0.6 10*3/uL — ABNORMAL HIGH (ref 0.0–0.5)
Eosinophils Relative: 3 %
HCT: 22.8 % — ABNORMAL LOW (ref 36.0–46.0)
Hemoglobin: 7.8 g/dL — ABNORMAL LOW (ref 12.0–15.0)
Immature Granulocytes: 1 %
Lymphocytes Relative: 7 %
Lymphs Abs: 1.6 10*3/uL (ref 0.7–4.0)
MCH: 34.5 pg — ABNORMAL HIGH (ref 26.0–34.0)
MCHC: 34.2 g/dL (ref 30.0–36.0)
MCV: 100.9 fL — ABNORMAL HIGH (ref 80.0–100.0)
Monocytes Absolute: 2 10*3/uL — ABNORMAL HIGH (ref 0.1–1.0)
Monocytes Relative: 9 %
Neutro Abs: 18.5 10*3/uL — ABNORMAL HIGH (ref 1.7–7.7)
Neutrophils Relative %: 80 %
Platelets: 376 10*3/uL (ref 150–400)
RBC: 2.26 MIL/uL — ABNORMAL LOW (ref 3.87–5.11)
RDW: 16.5 % — ABNORMAL HIGH (ref 11.5–15.5)
WBC: 22.9 10*3/uL — ABNORMAL HIGH (ref 4.0–10.5)
nRBC: 0 % (ref 0.0–0.2)

## 2019-04-24 LAB — COMPREHENSIVE METABOLIC PANEL
ALT: 22 U/L (ref 0–44)
AST: 44 U/L — ABNORMAL HIGH (ref 15–41)
Albumin: 2.5 g/dL — ABNORMAL LOW (ref 3.5–5.0)
Alkaline Phosphatase: 175 U/L — ABNORMAL HIGH (ref 38–126)
Anion gap: 11 (ref 5–15)
BUN: 9 mg/dL (ref 8–23)
CO2: 25 mmol/L (ref 22–32)
Calcium: 8.7 mg/dL — ABNORMAL LOW (ref 8.9–10.3)
Chloride: 100 mmol/L (ref 98–111)
Creatinine, Ser: 1.36 mg/dL — ABNORMAL HIGH (ref 0.44–1.00)
GFR calc Af Amer: 48 mL/min — ABNORMAL LOW (ref >60)
GFR calc non Af Amer: 42 mL/min — ABNORMAL LOW (ref >60)
Glucose, Bld: 101 mg/dL — ABNORMAL HIGH (ref 70–99)
Potassium: 4 mmol/L (ref 3.5–5.1)
Sodium: 136 mmol/L (ref 135–145)
Total Bilirubin: 0.6 mg/dL (ref 0.3–1.2)
Total Protein: 6.3 g/dL — ABNORMAL LOW (ref 6.5–8.1)

## 2019-04-24 LAB — URINE CULTURE
Culture: NO GROWTH
Culture: NO GROWTH

## 2019-04-24 LAB — C-REACTIVE PROTEIN: CRP: 7.1 mg/dL — ABNORMAL HIGH (ref <1.0)

## 2019-04-24 LAB — ECHOCARDIOGRAM COMPLETE
Height: 70 in
Weight: 2084.67 oz

## 2019-04-24 LAB — HEPATITIS C ANTIBODY: HCV Ab: NONREACTIVE

## 2019-04-24 LAB — LACTATE DEHYDROGENASE: LDH: 151 U/L (ref 98–192)

## 2019-04-24 LAB — SEDIMENTATION RATE: Sed Rate: 65 mm/hr — ABNORMAL HIGH (ref 0–22)

## 2019-04-24 LAB — D-DIMER, QUANTITATIVE: D-Dimer, Quant: 1.53 ug/mL-FEU — ABNORMAL HIGH (ref 0.00–0.50)

## 2019-04-24 LAB — PHOSPHORUS: Phosphorus: 2.6 mg/dL (ref 2.5–4.6)

## 2019-04-24 LAB — PROCALCITONIN: Procalcitonin: 0.69 ng/mL

## 2019-04-24 MED ORDER — MAGNESIUM SULFATE 2 GM/50ML IV SOLN
2.0000 g | Freq: Once | INTRAVENOUS | Status: AC
  Administered 2019-04-24: 2 g via INTRAVENOUS
  Filled 2019-04-24: qty 50

## 2019-04-24 MED ORDER — METRONIDAZOLE 500 MG PO TABS
2000.0000 mg | ORAL_TABLET | Freq: Once | ORAL | Status: AC
  Administered 2019-04-24: 2000 mg via ORAL
  Filled 2019-04-24: qty 4

## 2019-04-24 NOTE — Progress Notes (Signed)
Physical Therapy Treatment Patient Details Name: Christie Morrison MRN: [id number] DOB: Dec 17, 1955 Today's Date: 04/24/2019    History of Present Illness 63yo female presenting with seizure, confusion and disorientation. CT negative for acute CVA and code stroke ultimately cancelled. Admitted for seizure like activity in setting of severe electrolyte derangement. PMH cocaine use, ROH abuse, HLD, seizure    PT Comments    Pt performed gt training with max VCs for encouragement to participate.  Pt presents with staggering to R and LOB x2 to the R with poor obstacles negotiation.  Pt performed seated and supine exercises with noted weakness Bilaterally but more weak on L.  Continue to recommend HHPT f/u.  Pt appears depressed and ready to return home.     Follow Up Recommendations  Home health PT;Supervision/Assistance - 24 hour     Equipment Recommendations  None recommended by PT    Recommendations for Other Services       Precautions / Restrictions Precautions Precautions: Fall Restrictions Weight Bearing Restrictions: No    Mobility  Bed Mobility Overal bed mobility: Independent                Transfers Overall transfer level: Needs assistance Equipment used: None Transfers: Sit to/from Stand Sit to Stand: Supervision Stand pivot transfers: Supervision       General transfer comment: Supervision for hand placement, mildly unsteady in standing.  Ambulation/Gait Ambulation/Gait assistance: Min guard;Min assist Gait Distance (Feet): 150 Feet Assistive device: Rolling walker (2 wheeled) Gait Pattern/deviations: Step-through pattern;Drifts right/left;Narrow base of support;Trendelenburg;Staggering right Gait velocity: decreased   General Gait Details: Staggering noted to R x2.  Pt required min assistance to correct LOB.   Stairs             Wheelchair Mobility    Modified Rankin (Stroke Patients Only)       Balance     Sitting balance-Leahy  Scale: Normal       Standing balance-Leahy Scale: Fair                              Cognition Arousal/Alertness: Awake/alert Behavior During Therapy: Flat affect Overall Cognitive Status: No family/caregiver present to determine baseline cognitive functioning                                 General Comments: Pt very apolegetic post session for her attittude upon entry.  She was more receptive to tx today.      Exercises General Exercises - Lower Extremity Ankle Circles/Pumps: AROM;Both;10 reps;Supine Quad Sets: AROM;Both;10 reps;Supine Long Arc Quad: AROM;Both;10 reps;Seated Hip Flexion/Marching: AROM;Both;10 reps;Seated    General Comments        Pertinent Vitals/Pain Pain Assessment: Faces Faces Pain Scale: Hurts little more Pain Location: right flank Pain Descriptors / Indicators: Aching Pain Intervention(s): Monitored during session    Home Living                      Prior Function            PT Goals (current goals can now be found in the care plan section) Acute Rehab PT Goals Patient Stated Goal: to go back to work  Potential to Achieve Goals: Good    Frequency    Min 3X/week      PT Plan Current plan remains appropriate    Co-evaluation  AM-PAC PT "6 Clicks" Mobility   Outcome Measure  Help needed turning from your back to your side while in a flat bed without using bedrails?: None Help needed moving from lying on your back to sitting on the side of a flat bed without using bedrails?: None Help needed moving to and from a bed to a chair (including a wheelchair)?: A Little Help needed standing up from a chair using your arms (e.g., wheelchair or bedside chair)?: A Little Help needed to walk in hospital room?: A Little Help needed climbing 3-5 steps with a railing? : A Little 6 Click Score: 20    End of Session Equipment Utilized During Treatment: Gait belt Activity Tolerance: Patient  tolerated treatment well Patient left: in bed;with call bell/phone within reach Nurse Communication: Mobility status PT Visit Diagnosis: Unsteadiness on feet (R26.81);Difficulty in walking, not elsewhere classified (R26.2);Other abnormalities of gait and mobility (R26.89);Muscle weakness (generalized) (M62.81)     Time: [id number] PT Time Calculation (min) (ACUTE ONLY): 23 min  Charges:  $Gait Training: 8-22 mins $Therapeutic Exercise: 8-22 mins                     Erasmo Leventhal , PTA Acute Rehabilitation Services Pager [phone] Office [phone]     Tyreesha Maharaj Eli Hose 04/24/2019, 2:27 PM

## 2019-04-24 NOTE — Consult Note (Addendum)
[facility] for Infectious Disease    Date of Admission:  04/16/2019     Total days of antibiotics 8   Piperacillin-tazobactam day 5                Reason for Consult: Leukocytosis / FUO    Referring Provider: Alfredia Ferguson Primary Care Provider: Gildardo Pounds, NP   Assessment: KATHYANN SPAUGH is a 63 y.o. female admitted to the hospital after having a seizure at home thought to be related to abrupt cessation of alcohol and electrolyte derangements. We were consulted to assist with leukocytosis and intermittent fevers. In review of her chart she has had a documented leukocytosis of at least 16K since May 2020. She reports no constitutional findings aside from "being cold often" at home and at work, which is likely due to her chronic anemia and iron deficiency.    The cause of her leukocytosis is unclear. She did not report urinary symptoms leading to admission nor presently following antibiotic treatment and unclear if she had UTI vs asymptomatic bacteriuria.  Will stop her antibiotics at this time as we follow for more information. Non-specific reactants elevated with CRP 7, ESR 65; PCT slowly rising but may be more related to renal insufficiency and not specific to an infectious process. She denies any rashes, joint pain and only remarks on some stiffness in the knees at the end of the day. No known infectious contacts. TTE with normal EF and no findings concerning for endocarditis. No lymphadenopathy appreciated. No known family history of cancers. Has not had colonoscopy or routine screenings.   Chronic diarrhea, > 3 months = CDiff negative, GI pathogen panel pending. On exam she has no findings that would point to an obvious bacterial source. Chronic diarrhea is most likely not-infectious given that she has no other symptoms associated with it. She does have a mild eosinophilia.  Continue enteric precautions for now until results are back. Can D/C if negative for norovirus.  Improved with Imodium. ?pancreatic component with known alcohol use. Check lipase. No abnormal findings however on ultrasound.   Would discontinue the probiotic as she is  immunocompromised with her cirrhosis as this can be responsible for translocation of intestinal bacteria.   Of note she has Trichomonas infection on her UA that would explain the large WBC detected - would treat with 2 gm metronidazole po x 1. Check RPR. HIV (-)   Plan: 1. Check peripheral smear with chronic leukocytosis and anemia  2. Metronidazole x 1 dose  3. Check RPR 4. Check Hep C Ab with history of cocaine use in chart to update screening  5. Check lipase  6. Consider GI consult (?outpatient) for colonoscopy for 3 month history of diarrhea/stool changes to distinguish inflammatory vs alternative explanation and EGD screening with cirrhosis findings   Principal Problem:   Seizures ([place]) Active Problems:   Cocaine abuse (HCC)   Cirrhosis, alcoholic (HCC)   Hypokalemia   Hypocalcemia   Hypomagnesemia   Prolonged QT interval   Macrocytic anemia   Hyponatremia   AKI (acute kidney injury) ([place])   Seizure (HCC)   Klebsiella infection   . enoxaparin (LOVENOX) injection  40 mg Subcutaneous Q24H  . folic acid  1 mg Oral Daily  . magnesium oxide  400 mg Oral Daily  . multivitamin with minerals  1 tablet Oral Daily  . potassium chloride SA  20 mEq Oral Daily  . saccharomyces boulardii  250 mg Oral  BID  . thiamine  100 mg Oral Daily   Or  . thiamine  100 mg Intravenous Daily    HPI: SUGAR VANZANDT is a 63 y.o. female with alcoholic cirrhosis with ongoing alcohol use (3-4 shots liquor a day, last use > 1 week before hospitalization), chronic hypomagnesemia and hypokalemia, alcohol related seizures May 2020 who presented to [facility] on 11/26 with seizure.   Her roommate called EMS when she heard her fall in the bathroom at home the night of admission to ER. EMS arrived and found that she was confused and  incontinent of stool; reports that she was posturing and unresponsive at one point. Initially called as a code stroke because of aphasia, however this was cancelled once realized it was d/t postictal state. She was started on Keppra and consulted Neurology who felt this to be provoked seizure in the setting of withholding alcohol and electrolyte abnormalities and decided to not continue antiepileptic therapy.   On admission she did have a leukocytosis that initially improved, however trending back up again. On 11/28 she developed persistent diarrhea  - GI pathogen panel pending but CDiff was obtained given her leukocytosis and interpreted as negative. Urine culture revealed Klebsiella - IV zosyn was initiated to treat this x 7 days. She has not noted any dysuria or flank/ lower abdominal pain. Does cite a 12 month history of urge incontinence but not acutely worsened recently.   Acalculous cholecystitis has also been suspected with abnormal abdominal ultrasound however the HIDA scan was negative. General surgery has seen the patient and felt this to be unrelated to her leukocytosis. CXR was obtained revealing no acute or chronic findings to explain. She had some fevers on 11/29 to 100.8 that have recurred intermittently since. Initial blood cultures were drawn and negative, final. These were re-drawn yesterday PM. Creatinine 1.36 today; AST 44 ALT 22, Protein 6.3, TBili 0.6, ALk Phos 175; CRP 7.1, ESR 65, DDImer 1.53, PCT 0.69. Per review of her chart she has had a persistent leukocytosis > 16K since May 2020. Last normal wbc 9.8 in January 2020. She does not seek care regularly with MD in the community. She is not on any daily medications.   For her diarrhea she is getting Florastor probiotic and imodium to manage with most recent report indicating formed stool lumps which is the first time in many months. She has 2-3 diarrhea stools a day outpatient and has for months now. No associated abdominal pain,  distension, nausea, vomiting or weight change. She does not eat much normally and "snacks" throughout the day. 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   In addition to alcohol use she uses cocaine (per chart review - she denied this to me). Hep C RNA negative 06/08/2016, HIV neg 10/15/2018. Hep B sAg (-) 06/08/2016.    Review of Systems: Review of Systems  Constitutional: Positive for chills. Negative for activity change, appetite change, diaphoresis, fatigue, fever and unexpected weight change.  HENT: Negative for congestion, rhinorrhea, sneezing and trouble swallowing.   Eyes: Negative for itching.  Respiratory: Negative for cough, chest tightness and shortness of breath.   Gastrointestinal: Positive for diarrhea. Negative for abdominal distention, abdominal pain, blood in stool, constipation, nausea and vomiting.  Genitourinary: Positive for urgency (12 month history of urge incontinence). Negative for difficulty urinating, dysuria and flank pain.  Musculoskeletal: Negative for arthralgias, back pain, joint swelling and myalgias.  Stiff knees   Skin: Negative for color change, rash and wound.  Allergic/Immunologic: Positive for immunocompromised state (cirrhosis).  Neurological: Positive for seizures. Negative for facial asymmetry, weakness, numbness and headaches.  Hematological: Negative for adenopathy.  Psychiatric/Behavioral: Negative for behavioral problems, dysphoric mood and sleep disturbance. The patient is not nervous/anxious.        +alcohol use "most days of the week"     Past Medical History:  Diagnosis Date  . Acute hepatitis   . Cirrhosis ([place])   . Crack cocaine use   . Duodenitis   . ETOH abuse   . High cholesterol   . Hypertension   . Mallory-Weiss tear   . Seizures ([place])     Social History   Tobacco Use  . Smoking status: Never Smoker  . Smokeless tobacco: Never Used  Substance Use Topics  .  Alcohol use: Yes    Comment: occasionally   . Drug use: Not Currently    Frequency: 1.0 times per week    Types: Cocaine    Family History  Problem Relation Age of Onset  . Diabetes Mellitus II Mother   . Kidney failure Mother   . Alcohol abuse Mother   . Diabetes Mellitus II Brother   . Kidney failure Brother   . Alcohol abuse Maternal Grandmother   . Hypertension Other    No Known Allergies  OBJECTIVE: Blood pressure (!) 142/84, pulse 82, temperature 98.9 F (37.2 C), temperature source Oral, resp. rate 17, height 5' 10"  (1.778 m), weight 59.1 kg, SpO2 100 %.  Physical Exam Constitutional:      General: She is not in acute distress.    Appearance: Normal appearance. She is not ill-appearing.  HENT:     Head: Normocephalic.  Neurological:     Mental Status: She is alert.     Lab Results Lab Results  Component Value Date   WBC 22.9 (H) 04/24/2019   HGB 7.8 (L) 04/24/2019   HCT 22.8 (L) 04/24/2019   MCV 100.9 (H) 04/24/2019   PLT 376 04/24/2019    Lab Results  Component Value Date   CREATININE 1.36 (H) 04/24/2019   BUN 9 04/24/2019   NA 136 04/24/2019   K 4.0 04/24/2019   CL 100 04/24/2019   CO2 25 04/24/2019    Lab Results  Component Value Date   ALT 22 04/24/2019   AST 44 (H) 04/24/2019   ALKPHOS 175 (H) 04/24/2019   BILITOT 0.6 04/24/2019     Microbiology: Recent Results (from the past 240 hour(s))  SARS CORONAVIRUS 2 (TAT 6-24 HRS) Nasopharyngeal Nasopharyngeal Swab     Status: None   Collection Time: 04/16/19  5:40 AM   Specimen: Nasopharyngeal Swab  Result Value Ref Range Status   SARS Coronavirus 2 NEGATIVE NEGATIVE Final    Comment: (NOTE) SARS-CoV-2 target nucleic acids are NOT DETECTED. The SARS-CoV-2 RNA is generally detectable in upper and lower respiratory specimens during the acute phase of infection. Negative results do not preclude SARS-CoV-2 infection, do not rule out co-infections with other pathogens, and should not be used as  the sole basis for treatment or other patient management decisions. Negative results must be combined with clinical observations, patient history, and epidemiological information. The expected result is Negative. Fact Sheet for Patients: SugarRoll.be Fact Sheet for Healthcare Providers: https://www.woods-mathews.com/ This test is not yet approved or cleared by the [place] FDA and  has been authorized for detection and/or diagnosis of SARS-CoV-2 by FDA under an Emergency Use  Authorization (EUA). This EUA will remain  in effect (meaning this test can be used) for the duration of the COVID-19 declaration under Section 56 4(b)(1) of the Act, 21 U.S.C. section 360bbb-3(b)(1), unless the authorization is terminated or revoked sooner. Performed at [facility], [place] [address] [place], [place] [address]   Culture, Urine     Status: Abnormal   Collection Time: 04/17/19  5:51 AM   Specimen: Urine, Clean Catch  Result Value Ref Range Status   Specimen Description URINE, CLEAN CATCH  Final   Special Requests   Final    Normal Performed at [facility], [place] [address], [place], [place] [address]    Culture >=100,000 COLONIES/mL KLEBSIELLA PNEUMONIAE (A)  Final   Report Status 04/19/2019 FINAL  Final   Organism ID, Bacteria KLEBSIELLA PNEUMONIAE (A)  Final      Susceptibility   Klebsiella pneumoniae - MIC*    AMPICILLIN RESISTANT Resistant     CEFAZOLIN <=4 SENSITIVE Sensitive     CEFTRIAXONE <=1 SENSITIVE Sensitive     CIPROFLOXACIN <=0.25 SENSITIVE Sensitive     GENTAMICIN <=1 SENSITIVE Sensitive     IMIPENEM <=0.25 SENSITIVE Sensitive     NITROFURANTOIN 64 INTERMEDIATE Intermediate     TRIMETH/SULFA <=20 SENSITIVE Sensitive     AMPICILLIN/SULBACTAM 4 SENSITIVE Sensitive     PIP/TAZO <=4 SENSITIVE Sensitive     Extended ESBL NEGATIVE Sensitive     * >=100,000 COLONIES/mL KLEBSIELLA PNEUMONIAE  Culture, blood (routine x 2)      Status: None   Collection Time: 04/17/19  6:20 AM   Specimen: BLOOD  Result Value Ref Range Status   Specimen Description BLOOD RIGHT ARM  Final   Special Requests   Final    BOTTLES DRAWN AEROBIC AND ANAEROBIC Blood Culture adequate volume   Culture   Final    NO GROWTH 5 DAYS Performed at [facility], [address] [place], [place] [address]    Report Status 04/22/2019 FINAL  Final  Culture, blood (routine x 2)     Status: None   Collection Time: 04/17/19  6:20 AM   Specimen: BLOOD  Result Value Ref Range Status   Specimen Description BLOOD RIGHT ARM  Final   Special Requests   Final    BOTTLES DRAWN AEROBIC ONLY Blood Culture adequate volume   Culture   Final    NO GROWTH 5 DAYS Performed at [facility], [place] [address], [place], [place] [address]    Report Status 04/22/2019 FINAL  Final  C difficile quick scan w PCR reflex     Status: None   Collection Time: 04/17/19 12:26 PM   Specimen: Rectum; Stool  Result Value Ref Range Status   C Diff antigen NEGATIVE NEGATIVE Final   C Diff toxin NEGATIVE NEGATIVE Final   C Diff interpretation No C. difficile detected.  Final    Comment: Performed at [facility], [place] [address], [place], [place] [address]  Culture, blood (routine x 2)     Status: None (Preliminary result)   Collection Time: 04/23/19  8:37 AM   Specimen: BLOOD RIGHT ARM  Result Value Ref Range Status   Specimen Description BLOOD RIGHT ARM  Final   Special Requests   Final    BOTTLES DRAWN AEROBIC ONLY Blood Culture results may not be optimal due to an inadequate volume of blood received in culture bottles   Culture   Final    NO  GROWTH < 24 HOURS Performed at [facility], [place] [address] [place], [place] [address]    Report Status PENDING  Incomplete  Culture, blood (routine x 2)     Status: None (Preliminary result)   Collection Time: 04/23/19  8:46 AM   Specimen: BLOOD RIGHT HAND  Result Value Ref Range Status   Specimen  Description BLOOD RIGHT HAND  Final   Special Requests   Final    BOTTLES DRAWN AEROBIC ONLY Blood Culture results may not be optimal due to an inadequate volume of blood received in culture bottles   Culture   Final    NO GROWTH < 24 HOURS Performed at [facility], [place] [address], [place], [place] [address]    Report Status PENDING  Incomplete     Janene Madeira, MSN, NP-C [facility] Infectious Disease [email or web address] Pager: [phone] Office: [phone] [place]: [phone]

## 2019-04-24 NOTE — Progress Notes (Signed)
PROGRESS NOTE    Christie Morrison  IHK:[id number] DOB: [birth year] DOA: 04/16/2019 PCP: Gildardo Pounds, NP  Brief Narrative:  HPI per Dr. Myrene Buddy on 04/16/2019 Christie Morrison is a 63 y.o. F with hx of alcoholic cirrhosis, ongoing alcohol use, chronic hypomagnesemia and hypokalemia, and alcohol-related seizure in May of this year who presents with seizure.  Patient is very sleepy historian, but she relates feeling in her normal state of health until this evening.  Per report from ER triage, patient was at home tonight when roommate heard her fall in the bathroom, arrived to find her posturing, incontinent of stool.  EMS arrived and found her confused and disoriented.  In the ER, patient was tachycardic, blood pressure slightly elevated.  Initially called "code stroke" because of aphasia, but this was subsequently canceled as it was thought to be due to postictal state.  Sodium 133, potassium 2.9, magnesium 0.5, calcium 7.6, creatinine 1.6 (up from baseline 0.8), mild transaminitis, leukocytosis, macrocytic anemia.  Anion gap 25, CT head unremarkable.  She was given IV magnesium, IV calcium, IV potassium, and started on Keppra.  Neurology were consulted and the hospital service was asked to admit for severe electrolyte derangements and seizure.  **Interim History Patient admitted to having regular liquor consumption and stated that she drank the day before she presented.  She had an elevated WBC which was trending down but now trending back up and worsening.  I discussed the case with Dr. Irene Limbo of hematology who feels this may be a leukemoid reaction and recommends pan scan the patient if he continues to worsen and checking an echocardiogram as well as extremity duplex to rule out DVT.  She is completed her CIWA protocol Librium multivitamins and folic acid.  On 04/18/2019 she developed persistent diarrhea and C. difficile PCR was negative.  Urine culture showed Klebsiella UTI and  antibiotics for increased to IV Zosyn and will continue for 1 more days she will have 7 days of antibiotic treatment and stop in a.m.  There was concern for acalculous cholecystitis given her abdominal ultrasound but with a negative HIDA scan and general surgery evaluation did not feel that this is the cause of her worsening white count.  Her HIDA scan was normal.  Diet was advanced to 2 g sodium diet given her history of cirrhosis.  She started back on IV fluids yesterday given her slight hypotension because of her worsening renal function is now improving.  Because of her persistent leukocytosis infectious diseases was consulted formally for further evaluation recommendations and antibiotics will be stopped today she is complete her course for urinary tract infection  Assessment & Plan:   Principal Problem:   Seizures ([place]) Active Problems:   Cocaine abuse (HCC)   Cirrhosis, alcoholic (HCC)   Hypokalemia   Hypocalcemia   Hypomagnesemia   Prolonged QT interval   Macrocytic anemia   Hyponatremia   AKI (acute kidney injury) ([place])   Seizure (HCC)   Klebsiella infection  Alcohol-related seizure suspect provoked by alcohol withdrawal and electrolytes imbalance -Presented with alcohol level less than 10 and significant electrolyte abnormalities -Had a witnessed seizure at home by her son after decrease amount of alcohol intake -Seen by neurology, EEG no sign of seizure activity -No indication for seizure treatment at this time -Completed CIWA protocol.   -No evidence of alcohol withdrawal at this time.   -Continue multivitamin, thiamine and folic acid.   -Continue to replete electrolytes as indicated as magnesium level is low  again today  Sepsis secondary to klebsiella pneumoniae UTI and ? calculus cholecystitis versus other etiology -Presented with leukocytosis with WBC of 27,000, tachycardia with UA suggestive of UTI obtained on 04/17/2019. -Urine culture taken on 04/17/2019 grew greater  than 100,000 colonies of Klebsiella pneumoniae.   -Sensitivities show resistance to nitrofurantoin and ampicillin. -Completed 3 days of Rocephin and Switched to Keflex 500 mg 3 times daily x5 days on 04/19/19 but had Persistent watery stool reported.  C. difficile PCR negative on 04/17/2019.  GI panel by PCR in process still -Fever with T-max 100.5 on 04/20/2019.  Abdominal ultrasound suggestive of acute calculous cholecystitis. -PCT was 0.41 likely elevated 0.54 -Started on IV Zosyn empirically on 04/20/2019 instead given concern for Acute Calculous Cholecystitis and will continue it until today and stop tomorrow -HIDA scan obtained on 04/21/2019 and showed "Normal hepatobiliary imaging with normal gallbladder function.General surgery consulted on 04/21/2019." -Leukocytosis is worsening and now went from 14.4 -> 18.2 -> 20.7 ->24.4; ? Related to Diarrheal process but could be a leukemoid reaction and will panculture the patient -Stopped  IV fluids yesterday but will resume today; C/w IV antibiotics for today -Continue to monitor fever curve and WBC -Repeat CBC in the morning -If WBC still worsening will pan scan the patient to evaluate for any hidden abscess such as diverticular abscess or to evaluate for cell Sialadenitis and will evaluate the patient's mouth tomorrow; has not been febrile but T-max 99.7 last 24 hours -We will need to rule out other etiologies of elevated WBC and will obtain an echocardiogram to rule out endocarditis and lower extremity Dopplers to rule out DVT -Echocardiogram showed normal EF with no findings concerning for endocarditis and a extremity venous duplex was negative -Have consulted infectious diseases for further evaluation recommendation and they are checking a peripheral smear with chronic leukocytosis and anemia and given the patient metronidazole one-time dose given that she has some trichomonas infection on her UA -Infectious diseases also checking RPR and lipase  level as well as checking hepatitis C antibody with a history of cocaine to update the chart for screening -Factious diseases recommending considering GI consult for her 78-monthhistory of diarrhea or stool changes and possibly EGD screening with her cirrhosis  Leukocytosis -Worsening but now slowly trending down -See above -Panculture and will obtain an echocardiogram and lower extremity duplex to rule out DVT as was negative and no evidence of endocarditis -WBC is slowly trended down is now 22.9 thousand and ESR was 65 and CRP was 7.1 -Infectious diseases consulted and they have given the patient 2 g of metronidazole p.o. x1 for trichomonas found in her UA -Continue to monitor and trend and check peripheral smear with chronic leukocytosis and her anemia  ? Acute calculous cholecystitis -Management as stated above -Right upper quadrant pain on palpation the day before yesterday but not yesterday.  Complaining of crampy abdominal pain today -HIDA scan ordered and normal hepatobiliary imaging with Normal GB Fxn  Elevated alkaline phosphatase but trending donw  -T bili, AST ALT, normal. -General surgery will evaluate and feel that this is nonsurgical. -Was NPO but will place on a 2 gram Sodium Diet now that Surgery has no concerns for Acute Cholecystis   High anion gap metabolic acidosis -Bicarb 20 and anion gap of 16 on 04/21/2019. -Now CO2 is 25, anion gap is 11, and chloride is 100 -Initially had discontinued IV fluids but had resumed and given 500 and will bolus given her slight hypotension yesterday and this  is improving  Hypomagnesemia Presented with magnesium of 0.5 -Magnesium this AM was 1.5 and likely in the setting of diarrhea -Repleted with 2 g IV magnesium  -Continue oral magnesium oxide 400 mg daily -Tinea to monitor and replete as necessary Repeat magnesium level in a.m.  Diarrhea with unclear etiology, present on admission. -C. difficile PCR negative -GI panel in  process still and will continue enteric precautions for now and discontinue if norovirus is negative -Discontinued Florastor probiotic twice daily and Imodium 2 mg twice daily x 3 days -Resume gentle IV fluid hydration today given her worsening renal function -Of report this is chronic for greater than 3 months and likely will need a GI evaluation at some point -Patient stating that she had normal formed stool and is not as watery and states that is improving  Resolved concern for alcohol withdrawal -Presented with alcohol level less than 10. -Completed CIWA protocol -Continue multivitamins, thiamine and folic acid. -No sign of alcohol withdrawal at the time of this visit. -Has received alcohol cessation counseling at bedside.  Polysubstance abuse including cocaine and alcohol use disorder -Polysubstance abuse cessation counseled on at bedside -Last cocaine use was days ago -UDS positive for cocaine and benzodiazepine likely this to her antiseizure  AKI likely prerenal secondary to dehydration in the setting of diarrhea -Baseline creatinine 1.1 with GFR greater than 60 -Presented with creatinine 1.4 and GFR 46; Now BUN/Cr is trending upwards and is worse today and was 8/1.48 ; now slightly improved after fluid hydration and BUNs/creatinine is 9/1.36 -Continue to avoid nephrotoxins -Continue to monitor urine output;  -Start IV fluid hydration yesterday but will restart today -Daily BMP  Ambulatory Dysfunction/Physical Debility -PT OT assessed and recommended home health PT OT -Continue activities with assistance and fall precautions -Case manager working on home health services for DC planning  Resolved Transient Rachycardia -EKG done on 04/17/2019 showed sinus tachycardia rate of 103 -Suspect related to her sepsis -HR now ranging from 79-96  Prolonged QTC -Avoid QTC prolonging agents if possible  -Continue to monitor electrolytes and replete as indicated -Repeat  twelve-lead EKG on 04/21/2019 showed a QTc of 445 -Repeat EKG in AM   Macrocytic Anemia -Likely related to the setting of alcoholism -The patient's hemoglobin/hematocrit is stable currently at 7.8/22.8 -Check anemia panel and showed an iron level of 20, U IBC of 159, TIBC 179, saturation ratios of 11%, ferritin level 535, folate level of 19.3, and vitamin B12 of 536 -Continue to monitor for signs and symptoms of bleeding; currently no overt bleeding noted  Liver Cirrhosis likely 2/2 to Alcoholism  -Seen on RUQ U/S as it showed "Mildly nodular hepatic contour, suggesting cirrhosis. No focal hepatic lesion is seen." -T Bili and LFTs normal   Hypophosphatemia -Patient's phosphorus level has improved and is now 2.6 -Continue to monitor and replete as necessary -Repeat phosphorus level in a.m.  DVT prophylaxis: Enoxaparin 40 mg sq q24h Code Status: FULL CODE  Family Communication: No family present at bedside  Disposition Plan: Home Health PT when medically stable and Leukocytosis is improving and evaluated for this leukemoid reaction.  Consultants:   General Surgery   Neurology  Infectious Diseases   Procedures: HIDA  EEG 04/15/1985 IMPRESSION: This study is within normal limits. No seizures or epileptiform discharges were seen throughout the recording.   Antimicrobials:  Anti-infectives (From admission, onward)   Start     Dose/Rate Route Frequency Ordered Stop   04/24/19 1600  metroNIDAZOLE (FLAGYL) tablet 2,000 mg  2,000 mg Oral  Once 04/24/19 1531     04/20/19 2030  piperacillin-tazobactam (ZOSYN) IVPB 3.375 g  Status:  Discontinued     3.375 g 12.5 mL/hr over 240 Minutes Intravenous Every 8 hours 04/20/19 1339 04/24/19 1313   04/20/19 1345  piperacillin-tazobactam (ZOSYN) IVPB 3.375 g     3.375 g 100 mL/hr over 30 Minutes Intravenous  Once 04/20/19 1339 04/20/19 1537   04/19/19 1500  cephALEXin (KEFLEX) capsule 500 mg  Status:  Discontinued     500 mg Oral Every  8 hours 04/19/19 1447 04/20/19 1335   04/17/19 1000  cefTRIAXone (ROCEPHIN) 1 g in sodium chloride 0.9 % 100 mL IVPB  Status:  Discontinued     1 g 200 mL/hr over 30 Minutes Intravenous Every 24 hours 04/17/19 0910 04/19/19 1447     Subjective: Seen and examined at bedside and she had just come from bathroom and states that her stool getting a little bit better.  Felt a little fatigued.  No nausea or vomiting.  Denies any lightheadedness or dizziness.  No cough but states that her side hurts slightly when she does cough.  Denies any abdominal pain burning or discomfort in her urine or shortness of breath.  No other concerns or complaints at this time  Objective: Vitals:   04/24/19 0445 04/24/19 0745 04/24/19 1035 04/24/19 1244  BP: 137/79 (!) 142/84  112/73  Pulse: 86 82    Resp: '20 17  18  '$ Temp: 100.3 F (37.9 C) 100.2 F (37.9 C) 98.9 F (37.2 C) 98 F (36.7 C)  TempSrc: Oral Oral Oral Oral  SpO2: 100%   100%  Weight:      Height:        Intake/Output Summary (Last 24 hours) at 04/24/2019 1740 Last data filed at 04/24/2019 1400 Gross per 24 hour  Intake 748.91 ml  Output --  Net 748.91 ml   Filed Weights   04/16/19 1527 04/22/19 0500  Weight: 59.3 kg 59.1 kg   Examination: Physical Exam:  Constitutional: Thin female in NAD and appears calm and comfortable Eyes: Lids and conjunctivae normal, sclerae anicteric  ENMT: External Ears, Nose appear normal. Grossly normal hearing.  Neck: Appears normal, supple, no cervical masses, normal ROM, no appreciable thyromegaly; no JVD Respiratory: Diminished to auscultation bilaterally, no wheezing, rales, rhonchi or crackles. Normal respiratory effort and patient is not tachypenic. No accessory muscle use. Unlabored breathing Cardiovascular: RRR, no murmurs / rubs / gallops. S1 and S2 auscultated. No extremity edema.  Abdomen: Soft, non-tender, non-distended. Bowel sounds positive x4.  GU: Deferred. Musculoskeletal: No clubbing /  cyanosis of digits/nails. No joint deformity upper and lower extremities.  Skin: No rashes, lesions, ulcers on a limited skin evaluation. No induration; Warm and dry.  Neurologic: CN 2-12 grossly intact with no focal deficits. Romberg sign and cerebellar reflexes not assessed.  Psychiatric: Normal judgment and insight. Alert and oriented x 3. Normal mood but has a slightly flat affect.  Data Reviewed: I have personally reviewed following labs and imaging studies  CBC: Recent Labs  Lab 04/20/19 0359 04/21/19 0602 04/22/19 0337 04/23/19 0416 04/24/19 0303  WBC 14.4* 18.2* 20.7* 24.4* 22.9*  NEUTROABS  --  13.1* 15.3* 19.9* 18.5*  HGB 8.1* 7.7* 7.9* 7.9* 7.8*  HCT 25.0* 23.2* 24.1* 23.9* 22.8*  MCV 107.8* 105.0* 103.9* 103.9* 100.9*  PLT 270 287 317 356 858   Basic Metabolic Panel: Recent Labs  Lab 04/20/19 0359 04/20/19 1400 04/21/19 0602 04/22/19 0337 04/23/19 0416  04/24/19 0303  NA 139  --  139 138 136 136  K 3.9  --  3.9 4.1 3.9 4.0  CL 106  --  103 103 102 100  CO2 25  --  20* '25 25 25  '$ GLUCOSE 99  --  80 90 129* 101*  BUN 10  --  6* 6* 8 9  CREATININE 1.13*  --  1.17* 1.28* 1.48* 1.36*  CALCIUM 8.5*  --  8.9 9.0 9.1 8.7*  MG  --  1.2*  --  1.1* 1.9 1.5*  PHOS  --   --   --   --  <1.0* 2.6   GFR: Estimated Creatinine Clearance: 40 mL/min (A) (by C-G formula based on SCr of 1.36 mg/dL (H)). Liver Function Tests: Recent Labs  Lab 04/21/19 0602 04/22/19 0337 04/23/19 0416 04/24/19 0303  AST 40 27 30 44*  ALT '20 19 18 22  '$ ALKPHOS 145* 138* 150* 175*  BILITOT 0.4 0.7 0.4 0.6  PROT 6.2* 6.4* 6.2* 6.3*  ALBUMIN 2.8* 2.7* 2.6* 2.5*   No results for input(s): LIPASE, AMYLASE in the last 168 hours. No results for input(s): AMMONIA in the last 168 hours. Coagulation Profile: Recent Labs  Lab 04/21/19 1341  INR 1.2   Cardiac Enzymes: No results for input(s): CKTOTAL, CKMB, CKMBINDEX, TROPONINI in the last 168 hours. BNP (last 3 results) No results for  input(s): PROBNP in the last 8760 hours. HbA1C: No results for input(s): HGBA1C in the last 72 hours. CBG: No results for input(s): GLUCAP in the last 168 hours. Lipid Profile: No results for input(s): CHOL, HDL, LDLCALC, TRIG, CHOLHDL, LDLDIRECT in the last 72 hours. Thyroid Function Tests: No results for input(s): TSH, T4TOTAL, FREET4, T3FREE, THYROIDAB in the last 72 hours. Anemia Panel: Recent Labs    04/23/19 0416 04/23/19 0422  VITAMINB12 536  --   FOLATE  --  19.3  FERRITIN 535*  --   TIBC 179*  --   IRON 20*  --   RETICCTPCT 2.3  --    Sepsis Labs: Recent Labs  Lab 04/20/19 0359 04/23/19 0416 04/24/19 0303  PROCALCITON 0.41 0.54 0.69  LATICACIDVEN 1.9  --   --     Recent Results (from the past 240 hour(s))  SARS CORONAVIRUS 2 (TAT 6-24 HRS) Nasopharyngeal Nasopharyngeal Swab     Status: None   Collection Time: 04/16/19  5:40 AM   Specimen: Nasopharyngeal Swab  Result Value Ref Range Status   SARS Coronavirus 2 NEGATIVE NEGATIVE Final    Comment: (NOTE) SARS-CoV-2 target nucleic acids are NOT DETECTED. The SARS-CoV-2 RNA is generally detectable in upper and lower respiratory specimens during the acute phase of infection. Negative results do not preclude SARS-CoV-2 infection, do not rule out co-infections with other pathogens, and should not be used as the sole basis for treatment or other patient management decisions. Negative results must be combined with clinical observations, patient history, and epidemiological information. The expected result is Negative. Fact Sheet for Patients: SugarRoll.be Fact Sheet for Healthcare Providers: https://www.woods-mathews.com/ This test is not yet approved or cleared by the [place] FDA and  has been authorized for detection and/or diagnosis of SARS-CoV-2 by FDA under an Emergency Use Authorization (EUA). This EUA will remain  in effect (meaning this test can be used) for  the duration of the COVID-19 declaration under Section 56 4(b)(1) of the Act, 21 U.S.C. section 360bbb-3(b)(1), unless the authorization is terminated or revoked sooner. Performed at [facility], [place]  [address] [place], [place] [address]   Culture, Urine     Status: Abnormal   Collection Time: 04/17/19  5:51 AM   Specimen: Urine, Clean Catch  Result Value Ref Range Status   Specimen Description URINE, CLEAN CATCH  Final   Special Requests   Final    Normal Performed at [facility], [place] [address], [place], [place] [address]    Culture >=100,000 COLONIES/mL KLEBSIELLA PNEUMONIAE (A)  Final   Report Status 04/19/2019 FINAL  Final   Organism ID, Bacteria KLEBSIELLA PNEUMONIAE (A)  Final      Susceptibility   Klebsiella pneumoniae - MIC*    AMPICILLIN RESISTANT Resistant     CEFAZOLIN <=4 SENSITIVE Sensitive     CEFTRIAXONE <=1 SENSITIVE Sensitive     CIPROFLOXACIN <=0.25 SENSITIVE Sensitive     GENTAMICIN <=1 SENSITIVE Sensitive     IMIPENEM <=0.25 SENSITIVE Sensitive     NITROFURANTOIN 64 INTERMEDIATE Intermediate     TRIMETH/SULFA <=20 SENSITIVE Sensitive     AMPICILLIN/SULBACTAM 4 SENSITIVE Sensitive     PIP/TAZO <=4 SENSITIVE Sensitive     Extended ESBL NEGATIVE Sensitive     * >=100,000 COLONIES/mL KLEBSIELLA PNEUMONIAE  Culture, blood (routine x 2)     Status: None   Collection Time: 04/17/19  6:20 AM   Specimen: BLOOD  Result Value Ref Range Status   Specimen Description BLOOD RIGHT ARM  Final   Special Requests   Final    BOTTLES DRAWN AEROBIC AND ANAEROBIC Blood Culture adequate volume   Culture   Final    NO GROWTH 5 DAYS Performed at [facility], [address] [place], [place] [address]    Report Status 04/22/2019 FINAL  Final  Culture, blood (routine x 2)     Status: None   Collection Time: 04/17/19  6:20 AM   Specimen: BLOOD  Result Value Ref Range Status   Specimen Description BLOOD RIGHT ARM  Final   Special Requests   Final     BOTTLES DRAWN AEROBIC ONLY Blood Culture adequate volume   Culture   Final    NO GROWTH 5 DAYS Performed at [facility], [place] [address], [place], [place] [address]    Report Status 04/22/2019 FINAL  Final  C difficile quick scan w PCR reflex     Status: None   Collection Time: 04/17/19 12:26 PM   Specimen: Rectum; Stool  Result Value Ref Range Status   C Diff antigen NEGATIVE NEGATIVE Final   C Diff toxin NEGATIVE NEGATIVE Final   C Diff interpretation No C. difficile detected.  Final    Comment: Performed at [facility], [place] [address], [place], [place] [address]  Culture, blood (routine x 2)     Status: None (Preliminary result)   Collection Time: 04/23/19  8:37 AM   Specimen: BLOOD RIGHT ARM  Result Value Ref Range Status   Specimen Description BLOOD RIGHT ARM  Final   Special Requests   Final    BOTTLES DRAWN AEROBIC ONLY Blood Culture results may not be optimal due to an inadequate volume of blood received in culture bottles   Culture   Final    NO GROWTH < 24 HOURS Performed at [facility], [place] [address] [place], [place] [address]    Report Status PENDING  Incomplete  Culture, blood (routine x 2)     Status: None (Preliminary result)   Collection Time: 04/23/19  8:46 AM  Specimen: BLOOD RIGHT HAND  Result Value Ref Range Status   Specimen Description BLOOD RIGHT HAND  Final   Special Requests   Final    BOTTLES DRAWN AEROBIC ONLY Blood Culture results may not be optimal due to an inadequate volume of blood received in culture bottles   Culture   Final    NO GROWTH < 24 HOURS Performed at [place] [facility] [address] [place], [place] [address]    Report Status PENDING  Incomplete  Culture, Urine     Status: None   Collection Time: 04/23/19 12:52 PM   Specimen: Urine, Random  Result Value Ref Range Status   Specimen Description URINE, RANDOM  Final   Special Requests NONE  Final   Culture   Final    NO GROWTH Performed at [facility], [place] [address] [place],  [address]    Report Status 04/24/2019 FINAL  Final    Radiology Studies: Dg Chest Port 1 View  Result Date: 04/23/2019 CLINICAL DATA:  Leukocytosis.  Generalized weakness. EXAM: PORTABLE CHEST 1 VIEW COMPARISON:  04/17/2019 FINDINGS: Lungs are adequately inflated and otherwise clear. Cardiomediastinal silhouette and remainder of the exam is unchanged. IMPRESSION: No active disease. Electronically Signed   By: Marin Olp M.D.   On: 04/23/2019 09:12   Vas [place] Lower Extremity Venous (dvt)  Result Date: 04/23/2019  Lower Venous Study Indications: Leukocytosis.  Comparison Study: No prior study. Performing Technologist: Maudry Mayhew MHA, RDMS, RVT, RDCS  Examination Guidelines: A complete evaluation includes B-mode imaging, spectral Doppler, color Doppler, and power Doppler as needed of all accessible portions of each vessel. Bilateral testing is considered an integral part of a complete examination. Limited examinations for reoccurring indications may be performed as noted.  +---------+---------------+---------+-----------+----------+--------------+  RIGHT     Compressibility Phasicity Spontaneity Properties Thrombus Aging  +---------+---------------+---------+-----------+----------+--------------+  CFV       Full            Yes       Yes                                    +---------+---------------+---------+-----------+----------+--------------+  SFJ       Full                                                             +---------+---------------+---------+-----------+----------+--------------+  FV Prox   Full                                                             +---------+---------------+---------+-----------+----------+--------------+  FV Mid    Full                                                             +---------+---------------+---------+-----------+----------+--------------+  FV Distal Full                                                              +---------+---------------+---------+-----------+----------+--------------+  PFV       Full                                                             +---------+---------------+---------+-----------+----------+--------------+  POP       Full            Yes       Yes                                    +---------+---------------+---------+-----------+----------+--------------+  PTV       Full                                                             +---------+---------------+---------+-----------+----------+--------------+  PERO      Full                                                             +---------+---------------+---------+-----------+----------+--------------+   +---------+---------------+---------+-----------+----------+--------------+  LEFT      Compressibility Phasicity Spontaneity Properties Thrombus Aging  +---------+---------------+---------+-----------+----------+--------------+  CFV       Full            Yes       Yes                                    +---------+---------------+---------+-----------+----------+--------------+  SFJ       Full                                                             +---------+---------------+---------+-----------+----------+--------------+  FV Prox   Full                                                             +---------+---------------+---------+-----------+----------+--------------+  FV Mid    Full                                                             +---------+---------------+---------+-----------+----------+--------------+  FV Distal Full                                                             +---------+---------------+---------+-----------+----------+--------------+  PFV       Full                                                             +---------+---------------+---------+-----------+----------+--------------+  POP       Full            Yes       Yes                                     +---------+---------------+---------+-----------+----------+--------------+  PTV       Full                                                             +---------+---------------+---------+-----------+----------+--------------+  PERO      Full                                                             +---------+---------------+---------+-----------+----------+--------------+     Summary: Right: There is no evidence of deep vein thrombosis in the lower extremity. No cystic structure found in the popliteal fossa. Left: There is no evidence of deep vein thrombosis in the lower extremity. A cystic structure is found in the popliteal fossa.  *See table(s) above for measurements and observations. Electronically signed by Servando Snare MD on 04/23/2019 at 6:12:30 PM.    Final    Scheduled Meds:  enoxaparin (LOVENOX) injection  40 mg Subcutaneous S11D   folic acid  1 mg Oral Daily   magnesium oxide  400 mg Oral Daily   metroNIDAZOLE  2,000 mg Oral Once   multivitamin with minerals  1 tablet Oral Daily   potassium chloride SA  20 mEq Oral Daily   thiamine  100 mg Oral Daily   Or   thiamine  100 mg Intravenous Daily   Continuous Infusions:  sodium chloride 75 mL/hr at 04/23/19 2115    LOS: 7 days   Kerney Elbe, DO [facility] PAGER is on [place]  If 7PM-7AM, please contact night-coverage [email or web address]

## 2019-04-25 ENCOUNTER — Inpatient Hospital Stay (HOSPITAL_COMMUNITY): Payer: Self-pay

## 2019-04-25 LAB — CBC WITH DIFFERENTIAL/PLATELET
Abs Immature Granulocytes: 0.32 10*3/uL — ABNORMAL HIGH (ref 0.00–0.07)
Basophils Absolute: 0.1 10*3/uL (ref 0.0–0.1)
Basophils Relative: 0 %
Eosinophils Absolute: 0.6 10*3/uL — ABNORMAL HIGH (ref 0.0–0.5)
Eosinophils Relative: 3 %
HCT: 22.4 % — ABNORMAL LOW (ref 36.0–46.0)
Hemoglobin: 7.7 g/dL — ABNORMAL LOW (ref 12.0–15.0)
Immature Granulocytes: 1 %
Lymphocytes Relative: 10 %
Lymphs Abs: 2.6 10*3/uL (ref 0.7–4.0)
MCH: 34.4 pg — ABNORMAL HIGH (ref 26.0–34.0)
MCHC: 34.4 g/dL (ref 30.0–36.0)
MCV: 100 fL (ref 80.0–100.0)
Monocytes Absolute: 1.8 10*3/uL — ABNORMAL HIGH (ref 0.1–1.0)
Monocytes Relative: 7 %
Neutro Abs: 19.9 10*3/uL — ABNORMAL HIGH (ref 1.7–7.7)
Neutrophils Relative %: 79 %
Platelets: 388 10*3/uL (ref 150–400)
RBC: 2.24 MIL/uL — ABNORMAL LOW (ref 3.87–5.11)
RDW: 16.3 % — ABNORMAL HIGH (ref 11.5–15.5)
WBC: 25.3 10*3/uL — ABNORMAL HIGH (ref 4.0–10.5)
nRBC: 0 % (ref 0.0–0.2)

## 2019-04-25 LAB — COMPREHENSIVE METABOLIC PANEL
ALT: 24 U/L (ref 0–44)
AST: 49 U/L — ABNORMAL HIGH (ref 15–41)
Albumin: 2.7 g/dL — ABNORMAL LOW (ref 3.5–5.0)
Alkaline Phosphatase: 199 U/L — ABNORMAL HIGH (ref 38–126)
Anion gap: 10 (ref 5–15)
BUN: 10 mg/dL (ref 8–23)
CO2: 25 mmol/L (ref 22–32)
Calcium: 8.9 mg/dL (ref 8.9–10.3)
Chloride: 102 mmol/L (ref 98–111)
Creatinine, Ser: 1.26 mg/dL — ABNORMAL HIGH (ref 0.44–1.00)
GFR calc Af Amer: 53 mL/min — ABNORMAL LOW (ref >60)
GFR calc non Af Amer: 46 mL/min — ABNORMAL LOW (ref >60)
Glucose, Bld: 83 mg/dL (ref 70–99)
Potassium: 4.2 mmol/L (ref 3.5–5.1)
Sodium: 137 mmol/L (ref 135–145)
Total Bilirubin: 0.7 mg/dL (ref 0.3–1.2)
Total Protein: 6.4 g/dL — ABNORMAL LOW (ref 6.5–8.1)

## 2019-04-25 LAB — PROCALCITONIN: Procalcitonin: 0.65 ng/mL

## 2019-04-25 LAB — RPR: RPR Ser Ql: NONREACTIVE

## 2019-04-25 LAB — LIPASE, BLOOD: Lipase: 22 U/L (ref 11–51)

## 2019-04-25 LAB — D-DIMER, QUANTITATIVE: D-Dimer, Quant: 2.02 ug/mL-FEU — ABNORMAL HIGH (ref 0.00–0.50)

## 2019-04-25 LAB — LACTATE DEHYDROGENASE: LDH: 161 U/L (ref 98–192)

## 2019-04-25 LAB — GLUCOSE, CAPILLARY: Glucose-Capillary: 79 mg/dL (ref 70–99)

## 2019-04-25 LAB — SEDIMENTATION RATE: Sed Rate: 99 mm/hr — ABNORMAL HIGH (ref 0–22)

## 2019-04-25 LAB — MAGNESIUM: Magnesium: 1.9 mg/dL (ref 1.7–2.4)

## 2019-04-25 LAB — C-REACTIVE PROTEIN: CRP: 6.7 mg/dL — ABNORMAL HIGH (ref <1.0)

## 2019-04-25 LAB — PHOSPHORUS: Phosphorus: 2.4 mg/dL — ABNORMAL LOW (ref 2.5–4.6)

## 2019-04-25 MED ORDER — IOHEXOL 300 MG/ML  SOLN
100.0000 mL | Freq: Once | INTRAMUSCULAR | Status: AC | PRN
  Administered 2019-04-25: 100 mL via INTRAVENOUS

## 2019-04-25 MED ORDER — SODIUM CHLORIDE 0.9 % IV SOLN: INTRAVENOUS | Status: DC

## 2019-04-25 NOTE — Progress Notes (Signed)
PROGRESS NOTE    Christie Morrison  TLX:[id number] DOB: 1955-07-28 DOA: 04/16/2019 PCP: Gildardo Pounds, NP  Brief Narrative:  HPI per Dr. Myrene Buddy on 04/16/2019 Christie Morrison is a 63 y.o. F with hx of alcoholic cirrhosis, ongoing alcohol use, chronic hypomagnesemia and hypokalemia, and alcohol-related seizure in May of this year who presents with seizure.  Patient is very sleepy historian, but she relates feeling in her normal state of health until this evening.  Per report from ER triage, patient was at home tonight when roommate heard her fall in the bathroom, arrived to find her posturing, incontinent of stool.  EMS arrived and found her confused and disoriented.  In the ER, patient was tachycardic, blood pressure slightly elevated.  Initially called "code stroke" because of aphasia, but this was subsequently canceled as it was thought to be due to postictal state.  Sodium 133, potassium 2.9, magnesium 0.5, calcium 7.6, creatinine 1.6 (up from baseline 0.8), mild transaminitis, leukocytosis, macrocytic anemia.  Anion gap 25, CT head unremarkable.  She was given IV magnesium, IV calcium, IV potassium, and started on Keppra.  Neurology were consulted and the hospital service was asked to admit for severe electrolyte derangements and seizure.  **Interim History Patient admitted to having regular liquor consumption and stated that she drank the day before she presented.  She had an elevated WBC which was trending down but now trending back up and worsening.  I discussed the case with Dr. Irene Limbo of hematology who feels this may be a leukemoid reaction and recommends pan scan the patient if he continues to worsen and checking an echocardiogram as well as extremity duplex to rule out DVT.  She is completed her CIWA protocol Librium multivitamins and folic acid.  On 04/18/2019 she developed persistent diarrhea and C. difficile PCR was negative.  Urine culture showed Klebsiella UTI and  antibiotics for increased to IV Zosyn and will continue for 1 more days she will have 7 days of antibiotic treatment and stop in a.m.  There was concern for acalculous cholecystitis given her abdominal ultrasound but with a negative HIDA scan and general surgery evaluation did not feel that this is the cause of her worsening white count.  Her HIDA scan was normal.  Diet was advanced to 2 g sodium diet given her history of cirrhosis.  She started back on IV fluids yesterday given her slight hypotension because of her worsening renal function is now improving.  Because of her persistent leukocytosis infectious diseases was consulted formally for further evaluation recommendations and antibiotics will be stopped today she is complete her course for urinary tract infection.  This morning she spiked a temperature of 103.3 and so we will pan scan the patient obtain his CT of the chest with contrast along with a CT of the abdomen pelvis with contrast.  We will have infectious disease for the way to make additional recommendations given her new fever.  We will also repeat culture and obtain blood cultures again.  Assessment & Plan:   Principal Problem:   Seizures ([place]) Active Problems:   Cocaine abuse (HCC)   Cirrhosis, alcoholic (HCC)   Hypokalemia   Hypocalcemia   Hypomagnesemia   Prolonged QT interval   Macrocytic anemia   Hyponatremia   AKI (acute kidney injury) ([place])   Seizure (HCC)   Klebsiella infection  Alcohol-related seizure suspect provoked by alcohol withdrawal and electrolytes imbalance -Presented with alcohol level less than 10 and significant electrolyte abnormalities -Had a witnessed  seizure at home by her son after decrease amount of alcohol intake -Seen by neurology, EEG no sign of seizure activity -No indication for seizure treatment at this time -Completed CIWA protocol.   -No evidence of alcohol withdrawal at this time.   -Continue multivitamin, thiamine and folic acid.     -Continue to replete electrolytes as indicated as phosphorus level is low again today  Sepsis secondary to klebsiella pneumoniae UTI and ? calculus cholecystitis versus other etiology; Worsening Leukocytosis and now Febrile  -Presented with leukocytosis with WBC of 27,000, tachycardia with UA suggestive of UTI obtained on 04/17/2019. -Urine culture taken on 04/17/2019 grew greater than 100,000 colonies of Klebsiella pneumoniae.   -Sensitivities show resistance to nitrofurantoin and ampicillin. -Completed 3 days of Rocephin and Switched to Keflex 500 mg 3 times daily x5 days on 04/19/19 but had Persistent watery stool reported.  C. difficile PCR negative on 04/17/2019.  GI panel by PCR in process still -REPEAT URINE CX No Growth -Fever with T-max 100.5 on 04/20/2019. -Now has a T-Max of 103.2  Abdominal ultrasound suggestive of acute calculous cholecystitis. -PCT was 0.41 and slowly elevated  -Started on IV Zosyn empirically on 04/20/2019 instead given concern for Acute Calculous Cholecystitis and now stooped  -HIDA scan obtained on 04/21/2019 and showed "Normal hepatobiliary imaging with normal gallbladder function.General surgery consulted on 04/21/2019." -Leukocytosis is worsening and now went from 14.4 -> 18.2 -> 20.7 ->24.4 -> 22.9 -> 25.3 -C/w IVF but Abx now stopped and held -Continue to monitor fever curve and WBC -Repeat Blood Cx now.  -Repeat CBC in the morning -Since WBC still worsening will pan scan the patient to evaluate for any hidden abscess such as diverticular abscess or to evaluate for cell Sialadenitis and will evaluate the patient's mouth tomorrow;  Now Febrile and T Max of 103.3 -We will need to rule out other etiologies of elevated WBC and will obtain an echocardiogram to rule out endocarditis and lower extremity Dopplers to rule out DVT -Echocardiogram showed normal EF with no findings concerning for endocarditis and a extremity venous duplex was negative -Have consulted  Infectious Diseases for further evaluation recommendation and they are checking a peripheral smear with chronic leukocytosis and anemia and given the patient metronidazole one-time dose given that she has some trichomonas infection on her UA -Infectious diseases also checking RPR and lipase level as well as checking hepatitis C antibody with a history of cocaine to update the chart for screening; HCV Ab Negative  -Infectious Diseases recommending considering GI consult for her 52-monthhistory of diarrhea or stool changes and possibly EGD screening with her cirrhosis -CRP went from 7.1 -> 6.7 -ESR went from 65 -> 99 -D-Dimer went from 1.53 -> 2.02   Leukocytosis Worsening still  -See above -Panculture and will obtain an echocardiogram and lower extremity duplex to rule out DVT as was negative and no evidence of endocarditis -WBC is slowly trended down is now 22.9 thousand and ESR was 65 and CRP was 7.1 -Infectious diseases consulted and they have given the patient 2 g of metronidazole p.o. x1 for trichomonas found in her UA -Continue to monitor and trend and check peripheral smear with chronic leukocytosis and her anemia -Will pan-Scan the Patient and get a CT of the Chest/Abdomen/Pelvis with Contrast  ? Acute calculous cholecystitis -Management as stated above -Right upper quadrant pain on palpation the day before yesterday but not yesterday.  Complaining of crampy abdominal pain today -HIDA scan ordered and normal hepatobiliary imaging with  Normal GB Fxn  Elevated alkaline phosphatase but trending donw  -T bili is normal; AST slightly elevated and went from 44-49 and ALT went from 22-24 -General surgery will evaluate and feel that this is nonsurgical. -Was NPO but will place on a 2 gram Sodium Diet now that Surgery has no concerns for Acute Cholecystis  -Check CT of the abdomen pelvis with contrast of  High anion gap metabolic acidosis -Bicarb 20 and anion gap of 16 on 04/21/2019. -Now  CO2 is 25, anion gap is 10, and chloride is 102 -IV fluid is now being continued  Hypomagnesemia, improved -Presented with magnesium of 0.5 -Magnesium this AM was 1.9 -Continue oral magnesium oxide 400 mg daily -Continue to monitor and replete as necessary -Repeat magnesium level in a.m.  Diarrhea with unclear etiology, present on admission. -C. difficile PCR negative -GI panel in process still and will continue enteric precautions for now and discontinue if norovirus is negative -Discontinued Florastor probiotic twice daily and Imodium 2 mg twice daily x 3 days -Resume gentle IV fluid hydration today given her worsening renal function -Of report this is chronic for greater than 3 months and likely will need a GI evaluation at some point -Patient stating that she had normal formed stool and is not as watery and states that is improving  Resolved concern for alcohol withdrawal -Presented with alcohol level less than 10. -Completed CIWA protocol -Continue multivitamins, thiamine and folic acid. -No sign of alcohol withdrawal at the time of this visit. -Has received alcohol cessation counseling at bedside.  Polysubstance abuse including cocaine and alcohol use disorder -Polysubstance abuse cessation counseled on at bedside -Last cocaine use was days ago -UDS positive for cocaine and benzodiazepine likely this to her antiseizure  AKI likely prerenal secondary to dehydration in the setting of diarrhea -Baseline creatinine 1.1 with GFR greater than 60 -Presented with creatinine 1.4 and GFR 46; Now BUN/Cr is trending upwards and is worse today and was 8/1.48 ; now slightly improved after fluid hydration and BUNs/creatinine is 9/1.26 -Continue to avoid nephrotoxins -Continue to monitor urine output;  -Start IV fluid hydration yesterday but will restart today -Daily BMP  Ambulatory Dysfunction/Physical Debility -PT OT assessed and recommended home health PT OT -Continue  activities with assistance and fall precautions -Case manager working on home health services for DC planning  Resolved Transient Rachycardia -EKG done on 04/17/2019 showed sinus tachycardia rate of 103 -Suspect related to her sepsis -HR now ranging from 79-96  Prolonged QTC -Avoid QTC prolonging agents if possible  -Continue to monitor electrolytes and replete as indicated -Repeat twelve-lead EKG on 04/21/2019 showed a QTc of 445 -Repeat EKG in AM   Macrocytic Anemia -Likely related to the setting of alcoholism -The patient's hemoglobin/hematocrit is stable currently at 7.7/22.4 -Check anemia panel and showed an iron level of 20, U IBC of 159, TIBC 179, saturation ratios of 11%, ferritin level 535, folate level of 19.3, and vitamin B12 of 536 -Continue to monitor for signs and symptoms of bleeding; currently no overt bleeding noted  Liver Cirrhosis likely 2/2 to Alcoholism  -Seen on RUQ U/S as it showed "Mildly nodular hepatic contour, suggesting cirrhosis. No focal hepatic lesion is seen." -T Bili normal but AST is slightly elevated   Hypophosphatemia -Patient's phosphorus level is now 2.4 -Replete with po KPhos Neutral 500 mg x1 -Continue to monitor and replete as necessary -Repeat phosphorus level in a.m.  DVT prophylaxis: Enoxaparin 40 mg sq q24h Code Status: FULL CODE  Family  Communication: No family present at bedside  Disposition Plan: Home Health PT when medically stable and Leukocytosis is improving and evaluated for this leukemoid reaction.  Consultants:   General Surgery   Neurology  Infectious Diseases   Procedures: HIDA  EEG 04/15/1985 IMPRESSION: This study is within normal limits. No seizures or epileptiform discharges were seen throughout the recording.   Antimicrobials:  Anti-infectives (From admission, onward)   Start     Dose/Rate Route Frequency Ordered Stop   04/24/19 1600  metroNIDAZOLE (FLAGYL) tablet 2,000 mg     2,000 mg Oral  Once  04/24/19 1531 04/24/19 1807   04/20/19 2030  piperacillin-tazobactam (ZOSYN) IVPB 3.375 g  Status:  Discontinued     3.375 g 12.5 mL/hr over 240 Minutes Intravenous Every 8 hours 04/20/19 1339 04/24/19 1313   04/20/19 1345  piperacillin-tazobactam (ZOSYN) IVPB 3.375 g     3.375 g 100 mL/hr over 30 Minutes Intravenous  Once 04/20/19 1339 04/20/19 1537   04/19/19 1500  cephALEXin (KEFLEX) capsule 500 mg  Status:  Discontinued     500 mg Oral Every 8 hours 04/19/19 1447 04/20/19 1335   04/17/19 1000  cefTRIAXone (ROCEPHIN) 1 g in sodium chloride 0.9 % 100 mL IVPB  Status:  Discontinued     1 g 200 mL/hr over 30 Minutes Intravenous Every 24 hours 04/17/19 0910 04/19/19 1447     Subjective: Seen and examined at bedside and still states that her diarrhea is getting little bit more formed.  Today she is complaining of some crampy pain which she states is worse when she coughs.  No nausea or vomiting.  No burning or discomfort in her urine.  No shortness of breath.  No other concerns or complaints at this time but after I left the room she did spike a temperature of 103.3.  Objective: Vitals:   04/24/19 1244 04/24/19 2000 04/25/19 0911 04/25/19 1359  BP: 112/73 138/90 137/79 (!) 153/92  Pulse:  82 94   Resp: '18 15 16 16  '$ Temp: 98 F (36.7 C) 99.4 F (37.4 C) 99.6 F (37.6 C) (!) 103.3 F (39.6 C)  TempSrc: Oral Oral Oral Oral  SpO2: 100% 100% 100% 99%  Weight:      Height:        Intake/Output Summary (Last 24 hours) at 04/25/2019 1457 Last data filed at 04/25/2019 1000 Gross per 24 hour  Intake 240 ml  Output --  Net 240 ml   Filed Weights   04/16/19 1527 04/22/19 0500  Weight: 59.3 kg 59.1 kg   Examination: Physical Exam:  Constitutional: Thin AAF female in NAD and appears calm and comfortable Eyes: Lids and conjunctivae normal, sclerae anicteric  ENMT: External Ears, Nose appear normal. Grossly normal hearing.  Neck: Appears normal, supple, no cervical masses, normal ROM,  no appreciable thyromegaly; no JVD Respiratory: Diminished to auscultation bilaterally, no wheezing, rales, rhonchi or crackles. Normal respiratory effort and patient is not tachypenic. No accessory muscle use. Unlabored breathing  Cardiovascular: RRR, no murmurs / rubs / gallops. S1 and S2 auscultated. No extremity edema.  Abdomen: Soft, slightly tender, non-distended. Bowel sounds positive x4.  GU: Deferred. Musculoskeletal: No clubbing / cyanosis of digits/nails. No joint deformity upper and lower extremities.  Skin: No rashes, lesions, ulcers on a limited skin evaluation. No induration; Warm and dry.  Neurologic: CN 2-12 grossly intact with no focal deficits. Romberg sign and cerebellar reflexes not assessed.  Psychiatric: Normal judgment and insight. Alert and oriented x 3. Normal mood  but has a slightly flat affect.  Data Reviewed: I have personally reviewed following labs and imaging studies  CBC: Recent Labs  Lab 04/21/19 0602 04/22/19 0337 04/23/19 0416 04/24/19 0303 04/25/19 0303  WBC 18.2* 20.7* 24.4* 22.9* 25.3*  NEUTROABS 13.1* 15.3* 19.9* 18.5* 19.9*  HGB 7.7* 7.9* 7.9* 7.8* 7.7*  HCT 23.2* 24.1* 23.9* 22.8* 22.4*  MCV 105.0* 103.9* 103.9* 100.9* 100.0  PLT 287 317 356 376 646   Basic Metabolic Panel: Recent Labs  Lab 04/20/19 1400 04/21/19 0602 04/22/19 0337 04/23/19 0416 04/24/19 0303 04/25/19 0303  NA  --  139 138 136 136 137  K  --  3.9 4.1 3.9 4.0 4.2  CL  --  103 103 102 100 102  CO2  --  20* '25 25 25 25  '$ GLUCOSE  --  80 90 129* 101* 83  BUN  --  6* 6* '8 9 10  '$ CREATININE  --  1.17* 1.28* 1.48* 1.36* 1.26*  CALCIUM  --  8.9 9.0 9.1 8.7* 8.9  MG 1.2*  --  1.1* 1.9 1.5* 1.9  PHOS  --   --   --  <1.0* 2.6 2.4*   GFR: Estimated Creatinine Clearance: 43.2 mL/min (A) (by C-G formula based on SCr of 1.26 mg/dL (H)). Liver Function Tests: Recent Labs  Lab 04/21/19 0602 04/22/19 0337 04/23/19 0416 04/24/19 0303 04/25/19 0303  AST 40 27 30 44* 49*    ALT '20 19 18 22 24  '$ ALKPHOS 145* 138* 150* 175* 199*  BILITOT 0.4 0.7 0.4 0.6 0.7  PROT 6.2* 6.4* 6.2* 6.3* 6.4*  ALBUMIN 2.8* 2.7* 2.6* 2.5* 2.7*   Recent Labs  Lab 04/25/19 0303  LIPASE 22   No results for input(s): AMMONIA in the last 168 hours. Coagulation Profile: Recent Labs  Lab 04/21/19 1341  INR 1.2   Cardiac Enzymes: No results for input(s): CKTOTAL, CKMB, CKMBINDEX, TROPONINI in the last 168 hours. BNP (last 3 results) No results for input(s): PROBNP in the last 8760 hours. HbA1C: No results for input(s): HGBA1C in the last 72 hours. CBG: No results for input(s): GLUCAP in the last 168 hours. Lipid Profile: No results for input(s): CHOL, HDL, LDLCALC, TRIG, CHOLHDL, LDLDIRECT in the last 72 hours. Thyroid Function Tests: No results for input(s): TSH, T4TOTAL, FREET4, T3FREE, THYROIDAB in the last 72 hours. Anemia Panel: Recent Labs    04/23/19 0416 04/23/19 0422  VITAMINB12 536  --   FOLATE  --  19.3  FERRITIN 535*  --   TIBC 179*  --   IRON 20*  --   RETICCTPCT 2.3  --    Sepsis Labs: Recent Labs  Lab 04/20/19 0359 04/23/19 0416 04/24/19 0303 04/25/19 0303  PROCALCITON 0.41 0.54 0.69 0.65  LATICACIDVEN 1.9  --   --   --     Recent Results (from the past 240 hour(s))  SARS CORONAVIRUS 2 (TAT 6-24 HRS) Nasopharyngeal Nasopharyngeal Swab     Status: None   Collection Time: 04/16/19  5:40 AM   Specimen: Nasopharyngeal Swab  Result Value Ref Range Status   SARS Coronavirus 2 NEGATIVE NEGATIVE Final    Comment: (NOTE) SARS-CoV-2 target nucleic acids are NOT DETECTED. The SARS-CoV-2 RNA is generally detectable in upper and lower respiratory specimens during the acute phase of infection. Negative results do not preclude SARS-CoV-2 infection, do not rule out co-infections with other pathogens, and should not be used as the sole basis for treatment or other patient management decisions. Negative results must  be combined with clinical  observations, patient history, and epidemiological information. The expected result is Negative. Fact Sheet for Patients: SugarRoll.be Fact Sheet for Healthcare Providers: https://www.woods-mathews.com/ This test is not yet approved or cleared by the [place] FDA and  has been authorized for detection and/or diagnosis of SARS-CoV-2 by FDA under an Emergency Use Authorization (EUA). This EUA will remain  in effect (meaning this test can be used) for the duration of the COVID-19 declaration under Section 56 4(b)(1) of the Act, 21 U.S.C. section 360bbb-3(b)(1), unless the authorization is terminated or revoked sooner. Performed at [facility], [place] [address], [place], [place] [address]   Culture, Urine     Status: Abnormal   Collection Time: 04/17/19  5:51 AM   Specimen: Urine, Clean Catch  Result Value Ref Range Status   Specimen Description URINE, CLEAN CATCH  Final   Special Requests   Final    Normal Performed at [facility], [place] [address], [place], [place] [address]    Culture >=100,000 COLONIES/mL KLEBSIELLA PNEUMONIAE (A)  Final   Report Status 04/19/2019 FINAL  Final   Organism ID, Bacteria KLEBSIELLA PNEUMONIAE (A)  Final      Susceptibility   Klebsiella pneumoniae - MIC*    AMPICILLIN RESISTANT Resistant     CEFAZOLIN <=4 SENSITIVE Sensitive     CEFTRIAXONE <=1 SENSITIVE Sensitive     CIPROFLOXACIN <=0.25 SENSITIVE Sensitive     GENTAMICIN <=1 SENSITIVE Sensitive     IMIPENEM <=0.25 SENSITIVE Sensitive     NITROFURANTOIN 64 INTERMEDIATE Intermediate     TRIMETH/SULFA <=20 SENSITIVE Sensitive     AMPICILLIN/SULBACTAM 4 SENSITIVE Sensitive     PIP/TAZO <=4 SENSITIVE Sensitive     Extended ESBL NEGATIVE Sensitive     * >=100,000 COLONIES/mL KLEBSIELLA PNEUMONIAE  Culture, blood (routine x 2)     Status: None   Collection Time: 04/17/19  6:20 AM   Specimen: BLOOD  Result Value Ref Range Status   Specimen  Description BLOOD RIGHT ARM  Final   Special Requests   Final    BOTTLES DRAWN AEROBIC AND ANAEROBIC Blood Culture adequate volume   Culture   Final    NO GROWTH 5 DAYS Performed at [facility], [address] [place], [place] [address]    Report Status 04/22/2019 FINAL  Final  Culture, blood (routine x 2)     Status: None   Collection Time: 04/17/19  6:20 AM   Specimen: BLOOD  Result Value Ref Range Status   Specimen Description BLOOD RIGHT ARM  Final   Special Requests   Final    BOTTLES DRAWN AEROBIC ONLY Blood Culture adequate volume   Culture   Final    NO GROWTH 5 DAYS Performed at [facility], [place] [address], [place], [place] [address]    Report Status 04/22/2019 FINAL  Final  C difficile quick scan w PCR reflex     Status: None   Collection Time: 04/17/19 12:26 PM   Specimen: Rectum; Stool  Result Value Ref Range Status   C Diff antigen NEGATIVE NEGATIVE Final   C Diff toxin NEGATIVE NEGATIVE Final   C Diff interpretation No C. difficile detected.  Final    Comment: Performed at [facility], [place] [address], [place], [place] [address]  Culture, blood (routine x 2)     Status: None (Preliminary result)   Collection Time: 04/23/19  8:37 AM   Specimen: BLOOD RIGHT ARM  Result Value Ref Range Status   Specimen Description BLOOD RIGHT ARM  Final   Special Requests   Final    BOTTLES DRAWN AEROBIC ONLY Blood Culture results may not be optimal due to an inadequate volume of blood received in culture bottles   Culture   Final    NO GROWTH 2 DAYS Performed at [place] [address], [place], [place] [address]    Report Status PENDING  Incomplete  Culture, blood (routine x 2)     Status: None (Preliminary result)   Collection Time: 04/23/19  8:46 AM   Specimen: BLOOD RIGHT HAND  Result Value Ref Range Status   Specimen Description BLOOD RIGHT HAND  Final   Special Requests   Final    BOTTLES DRAWN AEROBIC ONLY Blood Culture results may not  be optimal due to an inadequate volume of blood received in culture bottles   Culture   Final    NO GROWTH 2 DAYS Performed at [facility], [place] [address] [place], [place] [address]    Report Status PENDING  Incomplete  Culture, Urine     Status: None   Collection Time: 04/23/19 12:52 PM   Specimen: Urine, Random  Result Value Ref Range Status   Specimen Description URINE, RANDOM  Final   Special Requests NONE  Final   Culture   Final    NO GROWTH Performed at [facility], [place] [address] [place], [place] [address]    Report Status 04/24/2019 FINAL  Final  Culture, Urine     Status: None   Collection Time: 04/23/19  4:33 PM   Specimen: Urine, Random  Result Value Ref Range Status   Specimen Description URINE, RANDOM  Final   Special Requests NONE  Final   Culture   Final    NO GROWTH Performed at [facility], [place] [address] [place], [place] [address]    Report Status 04/24/2019 FINAL  Final    Radiology Studies: Vas [place] Lower Extremity Venous (dvt)  Result Date: 04/23/2019  Lower Venous Study Indications: Leukocytosis.  Comparison Study: No prior study. Performing Technologist: Maudry Mayhew MHA, RDMS, RVT, RDCS  Examination Guidelines: A complete evaluation includes B-mode imaging, spectral Doppler, color Doppler, and power Doppler as needed of all accessible portions of each vessel. Bilateral testing is considered an integral part of a complete examination. Limited examinations for reoccurring indications may be performed as noted.  +---------+---------------+---------+-----------+----------+--------------+  RIGHT     Compressibility Phasicity Spontaneity Properties Thrombus Aging  +---------+---------------+---------+-----------+----------+--------------+  CFV       Full            Yes       Yes                                    +---------+---------------+---------+-----------+----------+--------------+  SFJ       Full                                                              +---------+---------------+---------+-----------+----------+--------------+  FV Prox   Full                                                             +---------+---------------+---------+-----------+----------+--------------+  FV Mid    Full                                                             +---------+---------------+---------+-----------+----------+--------------+  FV Distal Full                                                             +---------+---------------+---------+-----------+----------+--------------+  PFV       Full                                                             +---------+---------------+---------+-----------+----------+--------------+  POP       Full            Yes       Yes                                    +---------+---------------+---------+-----------+----------+--------------+  PTV       Full                                                             +---------+---------------+---------+-----------+----------+--------------+  PERO      Full                                                             +---------+---------------+---------+-----------+----------+--------------+   +---------+---------------+---------+-----------+----------+--------------+  LEFT      Compressibility Phasicity Spontaneity Properties Thrombus Aging  +---------+---------------+---------+-----------+----------+--------------+  CFV       Full            Yes       Yes                                    +---------+---------------+---------+-----------+----------+--------------+  SFJ       Full                                                             +---------+---------------+---------+-----------+----------+--------------+  FV Prox   Full                                                             +---------+---------------+---------+-----------+----------+--------------+  FV Mid    Full                                                              +---------+---------------+---------+-----------+----------+--------------+  FV Distal Full                                                             +---------+---------------+---------+-----------+----------+--------------+  PFV       Full                                                             +---------+---------------+---------+-----------+----------+--------------+  POP       Full            Yes       Yes                                    +---------+---------------+---------+-----------+----------+--------------+  PTV       Full                                                             +---------+---------------+---------+-----------+----------+--------------+  PERO      Full                                                             +---------+---------------+---------+-----------+----------+--------------+     Summary: Right: There is no evidence of deep vein thrombosis in the lower extremity. No cystic structure found in the popliteal fossa. Left: There is no evidence of deep vein thrombosis in the lower extremity. A cystic structure is found in the popliteal fossa.  *See table(s) above for measurements and observations. Electronically signed by Servando Snare MD on 04/23/2019 at 6:12:30 PM.    Final    Scheduled Meds:  enoxaparin (LOVENOX) injection  40 mg Subcutaneous P95K   folic acid  1 mg Oral Daily   magnesium oxide  400 mg Oral Daily   multivitamin with minerals  1 tablet Oral Daily   potassium chloride SA  20 mEq Oral Daily   thiamine  100 mg Oral Daily   Or   thiamine  100 mg Intravenous Daily   Continuous Infusions:  sodium chloride 75 mL/hr at 04/23/19 2115    LOS: 8 days   Kerney Elbe, DO [facility] PAGER is on [place]  If 7PM-7AM, please contact night-coverage [email or web address]

## 2019-04-26 ENCOUNTER — Inpatient Hospital Stay (HOSPITAL_COMMUNITY): Payer: Self-pay

## 2019-04-26 LAB — COMPREHENSIVE METABOLIC PANEL
ALT: 25 U/L (ref 0–44)
AST: 43 U/L — ABNORMAL HIGH (ref 15–41)
Albumin: 2.8 g/dL — ABNORMAL LOW (ref 3.5–5.0)
Alkaline Phosphatase: 210 U/L — ABNORMAL HIGH (ref 38–126)
Anion gap: 8 (ref 5–15)
BUN: 8 mg/dL (ref 8–23)
CO2: 24 mmol/L (ref 22–32)
Calcium: 9.3 mg/dL (ref 8.9–10.3)
Chloride: 106 mmol/L (ref 98–111)
Creatinine, Ser: 1.11 mg/dL — ABNORMAL HIGH (ref 0.44–1.00)
GFR calc Af Amer: >60 mL/min (ref >60)
GFR calc non Af Amer: 53 mL/min — ABNORMAL LOW (ref >60)
Glucose, Bld: 122 mg/dL — ABNORMAL HIGH (ref 70–99)
Potassium: 4 mmol/L (ref 3.5–5.1)
Sodium: 138 mmol/L (ref 135–145)
Total Bilirubin: 0.6 mg/dL (ref 0.3–1.2)
Total Protein: 6.5 g/dL (ref 6.5–8.1)

## 2019-04-26 LAB — CBC WITH DIFFERENTIAL/PLATELET
Abs Immature Granulocytes: 0 10*3/uL (ref 0.00–0.07)
Basophils Absolute: 0 10*3/uL (ref 0.0–0.1)
Basophils Relative: 0 %
Eosinophils Absolute: 0.6 10*3/uL — ABNORMAL HIGH (ref 0.0–0.5)
Eosinophils Relative: 2 %
HCT: 24.2 % — ABNORMAL LOW (ref 36.0–46.0)
Hemoglobin: 8 g/dL — ABNORMAL LOW (ref 12.0–15.0)
Lymphocytes Relative: 8 %
Lymphs Abs: 2.2 10*3/uL (ref 0.7–4.0)
MCH: 33.6 pg (ref 26.0–34.0)
MCHC: 33.1 g/dL (ref 30.0–36.0)
MCV: 101.7 fL — ABNORMAL HIGH (ref 80.0–100.0)
Monocytes Absolute: 0.8 10*3/uL (ref 0.1–1.0)
Monocytes Relative: 3 %
Neutro Abs: 24.4 10*3/uL — ABNORMAL HIGH (ref 1.7–7.7)
Neutrophils Relative %: 87 %
Platelets: 394 10*3/uL (ref 150–400)
RBC: 2.38 MIL/uL — ABNORMAL LOW (ref 3.87–5.11)
RDW: 16.8 % — ABNORMAL HIGH (ref 11.5–15.5)
WBC: 28.1 10*3/uL — ABNORMAL HIGH (ref 4.0–10.5)
nRBC: 0 % (ref 0.0–0.2)

## 2019-04-26 LAB — D-DIMER, QUANTITATIVE: D-Dimer, Quant: 3.22 ug/mL-FEU — ABNORMAL HIGH (ref 0.00–0.50)

## 2019-04-26 LAB — HEPATITIS PANEL, ACUTE
HCV Ab: NONREACTIVE
Hep A IgM: NONREACTIVE
Hep B C IgM: NONREACTIVE
Hepatitis B Surface Ag: NONREACTIVE

## 2019-04-26 LAB — LACTATE DEHYDROGENASE: LDH: 195 U/L — ABNORMAL HIGH (ref 98–192)

## 2019-04-26 LAB — MAGNESIUM: Magnesium: 1.6 mg/dL — ABNORMAL LOW (ref 1.7–2.4)

## 2019-04-26 LAB — C-REACTIVE PROTEIN: CRP: 8.8 mg/dL — ABNORMAL HIGH (ref <1.0)

## 2019-04-26 LAB — SEDIMENTATION RATE: Sed Rate: 89 mm/hr — ABNORMAL HIGH (ref 0–22)

## 2019-04-26 LAB — PHOSPHORUS: Phosphorus: 1.9 mg/dL — ABNORMAL LOW (ref 2.5–4.6)

## 2019-04-26 MED ORDER — MAGNESIUM SULFATE 2 GM/50ML IV SOLN
2.0000 g | Freq: Once | INTRAVENOUS | Status: AC
  Administered 2019-04-26: 2 g via INTRAVENOUS
  Filled 2019-04-26: qty 50

## 2019-04-26 MED ORDER — SODIUM PHOSPHATES 45 MMOLE/15ML IV SOLN
20.0000 mmol | Freq: Once | INTRAVENOUS | Status: AC
  Administered 2019-04-26: 20 mmol via INTRAVENOUS
  Filled 2019-04-26: qty 6.67

## 2019-04-26 MED ORDER — LIDOCAINE HCL (PF) 1 % IJ SOLN
INTRAMUSCULAR | Status: AC
  Filled 2019-04-26: qty 30

## 2019-04-26 MED ORDER — VANCOMYCIN HCL 10 G IV SOLR
1250.0000 mg | INTRAVENOUS | Status: DC
  Administered 2019-04-26 – 2019-04-28 (×2): 1250 mg via INTRAVENOUS
  Filled 2019-04-26 (×2): qty 1250

## 2019-04-26 MED ORDER — SODIUM CHLORIDE 0.9 % IV SOLN
3.0000 g | Freq: Four times a day (QID) | INTRAVENOUS | Status: DC
  Administered 2019-04-26 – 2019-04-30 (×14): 3 g via INTRAVENOUS
  Filled 2019-04-26 (×2): qty 8
  Filled 2019-04-26: qty 3
  Filled 2019-04-26: qty 8
  Filled 2019-04-26: qty 3
  Filled 2019-04-26: qty 8
  Filled 2019-04-26: qty 3
  Filled 2019-04-26: qty 8
  Filled 2019-04-26: qty 3
  Filled 2019-04-26 (×2): qty 8
  Filled 2019-04-26: qty 3
  Filled 2019-04-26 (×6): qty 8

## 2019-04-26 NOTE — Progress Notes (Addendum)
Physical Therapy Treatment Patient Details Name: Christie Morrison MRN: [id number] DOB: [birth year] Today's Date: 04/26/2019    History of Present Illness 63yo female presenting with seizure, confusion and disorientation. CT negative for acute CVA and code stroke ultimately cancelled. Admitted for seizure like activity in setting of severe electrolyte derangement. PMH cocaine use, ROH abuse, HLD, seizure    PT Comments    Limited session as pt only agreeable to ambulate to bathroom and back due to fatigue. Performing functional mobility at a min guard assist level. Continues with dynamic balance impairments and decreased activity tolerance.    Follow Up Recommendations  Home health PT;Supervision/Assistance - 24 hour     Equipment Recommendations  None recommended by PT    Recommendations for Other Services       Precautions / Restrictions Precautions Precautions: Fall Restrictions Weight Bearing Restrictions: No    Mobility  Bed Mobility Overal bed mobility: Independent                Transfers Overall transfer level: Needs assistance Equipment used: None;Rolling walker (2 wheeled) Transfers: Sit to/from Stand Sit to Stand: Supervision         General transfer comment: supervision for safety from edge of bed and toilet  Ambulation/Gait Ambulation/Gait assistance: Min guard Gait Distance (Feet): 30 Feet Assistive device: Rolling walker (2 wheeled) Gait Pattern/deviations: Step-through pattern;Drifts right/left;Narrow base of support;Staggering right Gait velocity: decreased   General Gait Details: Cues for walker etiquette/safety with proximity and turns. pt tending to abandon walker   Stairs             Wheelchair Mobility    Modified Rankin (Stroke Patients Only)       Balance Overall balance assessment: Needs assistance   Sitting balance-Leahy Scale: Normal     Standing balance support: No upper extremity supported;During functional  activity Standing balance-Leahy Scale: Fair Standing balance comment: washing hands at sink with supervision                            Cognition Arousal/Alertness: Awake/alert Behavior During Therapy: Flat affect Overall Cognitive Status: No family/caregiver present to determine baseline cognitive functioning                                 General Comments: Pt with minimal engagement with therapist during session; apolegetic post session for her behavior      Exercises      General Comments        Pertinent Vitals/Pain Pain Assessment: No/denies pain    Home Living                      Prior Function            PT Goals (current goals can now be found in the care plan section) Acute Rehab PT Goals Patient Stated Goal: to go back to work  Potential to Achieve Goals: Good    Frequency    Min 3X/week      PT Plan Current plan remains appropriate    Co-evaluation              AM-PAC PT "6 Clicks" Mobility   Outcome Measure  Help needed turning from your back to your side while in a flat bed without using bedrails?: None Help needed moving from lying on your back to sitting on the side of  a flat bed without using bedrails?: None Help needed moving to and from a bed to a chair (including a wheelchair)?: None Help needed standing up from a chair using your arms (e.g., wheelchair or bedside chair)?: A Little Help needed to walk in hospital room?: A Little Help needed climbing 3-5 steps with a railing? : A Little 6 Click Score: 21    End of Session Equipment Utilized During Treatment: Gait belt Activity Tolerance: Patient limited by fatigue Patient left: in bed;with call bell/phone within reach   PT Visit Diagnosis: Unsteadiness on feet (R26.81);Difficulty in walking, not elsewhere classified (R26.2);Other abnormalities of gait and mobility (R26.89);Muscle weakness (generalized) (M62.81)     Time: [id number] PT Time  Calculation (min) (ACUTE ONLY): 13 min  Charges:  $Therapeutic Activity: 8-22 mins                     Christie Morrison, PT, DPT Acute Rehabilitation Services Pager [phone] Office [phone]    Willy Eddy 04/26/2019, 5:07 PM

## 2019-04-26 NOTE — Progress Notes (Signed)
Patient ID: Christie Morrison, female   DOB: 1956/02/11, 63 y.o.   MRN: [id number] Pt presented to [place] dept today for paracentesis. On limited US abd in all 4 quadrants there is only trace amount ascites present and not enough to safely access at this time.  Procedure cancelled. Pt notified.

## 2019-04-26 NOTE — Progress Notes (Signed)
PROGRESS NOTE    Christie Morrison  HWE:[id number] DOB: 1956-01-01 DOA: 04/16/2019 PCP: Gildardo Pounds, NP  Brief Narrative:  HPI per Dr. Myrene Buddy on 04/16/2019 Christie Morrison is a 63 y.o. F with hx of alcoholic cirrhosis, ongoing alcohol use, chronic hypomagnesemia and hypokalemia, and alcohol-related seizure in May of this year who presents with seizure.  Patient is very sleepy historian, but she relates feeling in her normal state of health until this evening.  Per report from ER triage, patient was at home tonight when roommate heard her fall in the bathroom, arrived to find her posturing, incontinent of stool.  EMS arrived and found her confused and disoriented.  In the ER, patient was tachycardic, blood pressure slightly elevated.  Initially called "code stroke" because of aphasia, but this was subsequently canceled as it was thought to be due to postictal state.  Sodium 133, potassium 2.9, magnesium 0.5, calcium 7.6, creatinine 1.6 (up from baseline 0.8), mild transaminitis, leukocytosis, macrocytic anemia.  Anion gap 25, CT head unremarkable.  She was given IV magnesium, IV calcium, IV potassium, and started on Keppra.  Neurology were consulted and the hospital service was asked to admit for severe electrolyte derangements and seizure.  **Interim History Patient admitted to having regular liquor consumption and stated that she drank the day before she presented.  She had an elevated WBC which was trending down but now trending back up and worsening.  I discussed the case with Dr. Irene Limbo of hematology who feels this may be a leukemoid reaction and recommends pan scan the patient if he continues to worsen and checking an echocardiogram as well as extremity duplex to rule out DVT.  She is completed her CIWA protocol Librium multivitamins and folic acid.  On 04/18/2019 she developed persistent diarrhea and C. difficile PCR was negative.  Urine culture showed Klebsiella UTI and  antibiotics for increased to IV Zosyn and will continue for 1 more days she will have 7 days of antibiotic treatment and stop in a.m.  There was concern for acalculous cholecystitis given her abdominal ultrasound but with a negative HIDA scan and general surgery evaluation did not feel that this is the cause of her worsening white count.  Her HIDA scan was normal.  Diet was advanced to 2 g sodium diet given her history of cirrhosis.  She started back on IV fluids yesterday given her slight hypotension because of her worsening renal function is now improving.  Because of her persistent leukocytosis infectious diseases was consulted formally for further evaluation recommendations and antibiotics will be stopped today she is complete her course for urinary tract infection.  Yesterday morning she spiked a temperature of 103.3 and so we will pan scan the patient obtain his CT of the chest with contrast along with a CT of the abdomen pelvis with contrast.  We will have infectious disease for the way to make additional recommendations given her new fever.  We will also repeat cultures and obtain blood cultures again.  Chest abdomen pelvis CT scan showed a small right pleural effusion associated with atelectasis and gallbladder wall edema and thickening that was nonspecific along with multiple peripheral wedge-shaped areas of hypoattenuation spleen likely representing splenic infarcts and moderate volume ascites on CT scan.  We repeated a right upper quadrant ultrasound and ordered a paracentesis to be done however there is too small of ascites to obtain any fluid.  I have asked General Surgery to reevaluate the gallbladder given her findings and because  the patient was complaining of some right-sided abdominal pain.  Assessment & Plan:   Principal Problem:   Seizures ([place]) Active Problems:   Cocaine abuse (HCC)   Cirrhosis, alcoholic (HCC)   Hypokalemia   Hypocalcemia   Hypomagnesemia   Prolonged QT  interval   Macrocytic anemia   Hyponatremia   AKI (acute kidney injury) ([place])   Seizure (HCC)   Klebsiella infection  Alcohol-related seizure suspect provoked by alcohol withdrawal and electrolytes imbalance -Presented with alcohol level less than 10 and significant electrolyte abnormalities -Had a witnessed seizure at home by her son after decrease amount of alcohol intake -Seen by neurology, EEG no sign of seizure activity -No indication for seizure treatment at this time -Completed CIWA protocol.   -No evidence of alcohol withdrawal at this time.   -Continue multivitamin, thiamine and folic acid.   -Continue to replete electrolytes as indicated as phosphorus level is low again today  Sepsis secondary to klebsiella pneumoniae UTI and ? calculus cholecystitis versus other etiology; Worsening Leukocytosis and now Febrile and worsening  -Presented with leukocytosis with WBC of 27,000, tachycardia with UA suggestive of UTI obtained on 04/17/2019. -Urine culture taken on 04/17/2019 grew greater than 100,000 colonies of Klebsiella pneumoniae.   -Sensitivities show resistance to nitrofurantoin and ampicillin. -Completed 3 days of Rocephin and Switched to Keflex 500 mg 3 times daily x5 days on 04/19/19 but had Persistent watery stool reported.  C. difficile PCR negative on 04/17/2019.  GI panel by PCR in process still -REPEAT URINE CX No Growth -Fever with T-max 100.5 on 04/20/2019 and had a T-Max of 103.2 yesterday  Abdominal ultrasound suggestive of acute calculous cholecystitis; Repeated Abdominal U/S today  -PCT was 0.41 and slowly elevated  -Started on IV Zosyn empirically on 04/20/2019 instead given concern for Acute Calculous Cholecystitis and now stooped  -HIDA scan obtained on 04/21/2019 and showed "Normal hepatobiliary imaging with normal gallbladder function." General surgery consulted on 04/21/2019. -Leukocytosis is worsening and now went from 14.4 -> 18.2 -> 20.7 ->24.4 -> 22.9 ->  25.3 -> 28.1 -IV fluids have now been stopped and so of antibiotics -Continue to monitor fever curve and WBC -Repeat Blood Cx showd NGTD <24 Hours -Since WBC still worsening will pan scan the patient to evaluate for any hidden abscess such as diverticular abscess or to evaluate for cell Sialadenitis and will evaluate the patient's mouth tomorrow;  Now Febrile and T Max of 103.3  -Repeat CT Abdomen and Pelvis showed a small right pleural effusion associated with atelectasis and gallbladder wall edema and thickening that was nonspecific along with multiple peripheral wedge-shaped areas of hypoattenuation spleen likely representing splenic infarcts and moderate volume ascites on CT scan.   -We repeated a right upper quadrant ultrasound and ordered a paracentesis to be done however there is too small of ascites to obtain any fluid.   -I have asked General Surgery to reevaluate the gallbladder given her findings and because the patient was complaining of some right-sided abdominal pain. -Rule out other etiologies of elevated WBC and will obtain an echocardiogram to rule out endocarditis and lower extremity Dopplers to rule out DVT -Echocardiogram showed normal EF with no findings concerning for endocarditis and a extremity venous duplex was negative -Have consulted Infectious Diseases for further evaluation recommendation and they are checking a peripheral smear with chronic leukocytosis and anemia and given the patient metronidazole one-time dose given that she has some trichomonas infection on her UA -Infectious diseases also checking RPR and lipase level  as well as checking hepatitis C antibody with a history of cocaine to update the chart for screening; HCV Ab Negative  -Infectious Diseases recommending considering GI consult for her 84-monthhistory of diarrhea or stool changes and possibly EGD screening with her cirrhosis -CRP went from 7.1 -> 6.7 -> 8.8  -ESR went from 65 -> 99 -> 89 -D-Dimer went  from 1.53 -> 2.02 -> 3.22  Leukocytosis -Worsening still and now is 28.1  -See above -Pancultured and obtained an echocardiogram and lower extremity duplex to rule out DVT as was negative and no evidence of endocarditis -WBC is slowly trended down is now 22.9 thousand and ESR was 65 and CRP was 7.1 -Infectious diseases consulted and they have given the patient 2 g of metronidazole p.o. x1 for trichomonas found in her UA -Continue to monitor and trend and check peripheral smear with chronic leukocytosis and her anemia -Pan-Scan the Patient and get a CT of the Chest/Abdomen/Pelvis with Contrast as above -Will have ID weight in  ? Acute calculous cholecystitis -Management as stated above -Had Right UQ Pain today  -HIDA scan ordered and normal hepatobiliary imaging with Normal GB Fxn  Elevated alkaline phosphatase and was trending down but now trending back up -T bili is normal; AST slightly elevated and went from 44 -> 49 -> 43 and ALT went from 22 -> 24 -> 25 -General surgery will evaluate and feel that this is nonsurgical given her repeat CT of the abdomen pelvis and repeat ultrasound will reevaluate if she continues to have pain -Was NPO but will place on a 2 gram Sodium Diet now that Surgery has no concerns for Acute Cholecystis  -Follow-up on what Dr. WRedmond Pullingrecommends  High anion gap metabolic acidosis -Bicarb 20 and anion gap of 16 on 04/21/2019. -Now CO2 is 24, anion gap is 8, and chloride is 106 -IV fluids now stopped -Continue to monitor and trend repeat CMP in a.m.  Hypomagnesemia, improved -Presented with magnesium of 0.5 -Magnesium this AM was 1.6 -Replete with IV magnesium 2 g -Continue oral magnesium oxide 400 mg daily -Continue to monitor and replete as necessary -Repeat magnesium level in a.m.  Hypophosphatemia -Patient's phosphorus level this morning was 1.9 -Replete with IV sodium phosphate 20 mmol -Continue monitor and replete as necessary -Repeat phosphorus  level in a.m.  Diarrhea with unclear etiology, present on admission, slowly improving -C. difficile PCR negative -GI panel in process still and will continue enteric precautions for now and discontinue if norovirus is negative -Discontinued Florastor probiotic twice daily and Imodium 2 mg twice daily x 3 days -Resume gentle IV fluid hydration today given her worsening renal function -Of report this is chronic for greater than 3 months and likely will need a GI evaluation at some point -Patient stating that she had normal formed stool and is not as watery and states that is improving  Resolved concern for alcohol withdrawal -Presented with alcohol level less than 10. -Completed CIWA protocol -Continue multivitamins, thiamine and folic acid. -No sign of alcohol withdrawal at the time of this visit. -Has received alcohol cessation counseling at bedside.  Polysubstance abuse including cocaine and alcohol use disorder -Polysubstance abuse cessation counseled on at bedside -Last cocaine use was days ago -UDS positive for cocaine and benzodiazepine likely this to her antiseizure  AKI likely prerenal secondary to dehydration in the setting of diarrhea -Baseline creatinine 1.1 with GFR greater than 60 -Presented with creatinine 1.4 and GFR 46; Now BUN/Cr is trending upwards and  is worse today and was 8/1.48 ; now slightly improved after fluid hydration and BUNs/creatinine is 8/1.11 -Continue to avoid nephrotoxins -Continue to monitor urine output;  -IV fluid hydration is now stopped -Repeat CMP in the a.m.  Ambulatory Dysfunction/Physical Debility -PT OT assessed and recommended home health PT OT -Continue activities with assistance and fall precautions -Case manager working on home health services for DC planning  Resolved Transient Rachycardia -EKG done on 04/17/2019 showed sinus tachycardia rate of 103 -Suspect related to her sepsis -HR now ranging from 79-96  Prolonged  QTC -Avoid QTC prolonging agents if possible  -Continue to monitor electrolytes and replete as indicated -Repeat twelve-lead EKG on 04/21/2019 showed a QTc of 445 -Repeat EKG in AM   Macrocytic Anemia -Likely related to the setting of alcoholism -The patient's hemoglobin/hematocrit is stable currently at 8.0/24.2 -Check anemia panel and showed an iron level of 20, U IBC of 159, TIBC 179, saturation ratios of 11%, ferritin level 535, folate level of 19.3, and vitamin B12 of 536 -Continue to monitor for signs and symptoms of bleeding; currently no overt bleeding noted  Liver Cirrhosis likely 2/2 to Alcoholism  -Seen on RUQ U/S as it showed "Mildly nodular hepatic contour, suggesting cirrhosis. No focal hepatic lesion is seen." -T Bili normal but AST is slightly elevated at 43 -Repeat CT scan of the abdomen pelvis showed moderate volume ascites but when ultrasound was done showed small volume ascites and therapeutic paracentesis could not be done safely -Repeat U/S of the Abdomen showed "Cholelithiasis and gallbladder wall thickening, without sonographic Murphy's sign. Small volume abdominal ascites.  Paracentesis deferred." -Continue to monitor and trend liver functions daily  DVT prophylaxis: Enoxaparin 40 mg sq q24h Code Status: FULL CODE  Family Communication: No family present at bedside  Disposition Plan: [facility] PT when medically stable and Leukocytosis is improving and evaluated for this leukemoid reaction.  Consultants:   General Surgery   Neurology  Infectious Diseases   Procedures: HIDA  EEG 04/15/1985 IMPRESSION: This study is within normal limits. No seizures or epileptiform discharges were seen throughout the recording.   Antimicrobials:  Anti-infectives (From admission, onward)   Start     Dose/Rate Route Frequency Ordered Stop   04/24/19 1600  metroNIDAZOLE (FLAGYL) tablet 2,000 mg     2,000 mg Oral  Once 04/24/19 1531 04/24/19 1807   04/20/19 2030   piperacillin-tazobactam (ZOSYN) IVPB 3.375 g  Status:  Discontinued     3.375 g 12.5 mL/hr over 240 Minutes Intravenous Every 8 hours 04/20/19 1339 04/24/19 1313   04/20/19 1345  piperacillin-tazobactam (ZOSYN) IVPB 3.375 g     3.375 g 100 mL/hr over 30 Minutes Intravenous  Once 04/20/19 1339 04/20/19 1537   04/19/19 1500  cephALEXin (KEFLEX) capsule 500 mg  Status:  Discontinued     500 mg Oral Every 8 hours 04/19/19 1447 04/20/19 1335   04/17/19 1000  cefTRIAXone (ROCEPHIN) 1 g in sodium chloride 0.9 % 100 mL IVPB  Status:  Discontinued     1 g 200 mL/hr over 30 Minutes Intravenous Every 24 hours 04/17/19 0910 04/19/19 1447     Subjective: Seen and examined at bedside while she is in the ultrasound suite about to get her ultrasound done and she is complaining of some right sided abdominal pain.  No nausea or vomiting but spiked a temperature yesterday 103.  Denies any lightheadedness or dizziness but still does not feel well and feels fatigued.  States her stool is more formed.  No other concerns or complaints at this time.  Objective: Vitals:   04/26/19 0404 04/26/19 0729 04/26/19 1122 04/26/19 1507  BP: 127/82 (!) 158/98 (!) 162/95 (!) 150/92  Pulse: 90 87 91 89  Resp: _0 Temp: 99.1 F (37.3 C) 98.6 F (37 C) 98.7 F (37.1 C) 99 F (37.2 C)  TempSrc: Oral Oral Oral Oral  SpO2: 100% 100% 99% 98%  Weight:      Height:        Intake/Output Summary (Last 24 hours) at 04/26/2019 1621 Last data filed at 04/26/2019 0734 Gross per 24 hour  Intake 1335.68 ml  Output --  Net 1335.68 ml   Filed Weights   04/16/19 1527 04/22/19 0500  Weight: 59.3 kg 59.1 kg   Examination: Physical Exam:  Constitutional: Thin AAF in mild distress appears calm but does seem uncomfortable Eyes: Lids and conjunctivae normal, sclerae anicteric  ENMT: External Ears, Nose appear normal. Grossly normal hearing.  Neck: Appears normal, supple, no cervical masses, normal ROM, no appreciable  thyromegaly; no JVD Respiratory: Diminished to auscultation bilaterally, no wheezing, rales, rhonchi or crackles. Normal respiratory effort and patient is not tachypenic. No accessory muscle use. Unlabored breathing  Cardiovascular: RRR, no murmurs / rubs / gallops. S1 and S2 auscultated. No extremity edema. Abdomen: Soft, Tender to palpate, Mildly distended. Bowel sounds positive x4.  GU: Deferred. Musculoskeletal: No clubbing / cyanosis of digits/nails. No joint deformity upper and lower extremities.  Skin: No rashes, lesions, ulcers on a limited skin evaluation. No induration; Warm and dry.  Neurologic: CN 2-12 grossly intact with no focal deficits. Romberg sign  And cerebellar reflexes not assessed.  Psychiatric: Normal judgment and insight. Alert and oriented x 3. Depressed appearing mood and has a flat affect.   Data Reviewed: I have personally reviewed following labs and imaging studies  CBC: Recent Labs  Lab 04/22/19 0337 04/23/19 0416 04/24/19 0303 04/25/19 0303 04/26/19 0340  WBC 20.7* 24.4* 22.9* 25.3* 28.1*  NEUTROABS 15.3* 19.9* 18.5* 19.9* 24.4*  HGB 7.9* 7.9* 7.8* 7.7* 8.0*  HCT 24.1* 23.9* 22.8* 22.4* 24.2*  MCV 103.9* 103.9* 100.9* 100.0 101.7*  PLT 317 356 376 388 867   Basic Metabolic Panel: Recent Labs  Lab 04/22/19 0337 04/23/19 0416 04/24/19 0303 04/25/19 0303 04/26/19 0340  NA 138 136 136 137 138  K 4.1 3.9 4.0 4.2 4.0  CL 103 102 100 102 106  CO2 _1 GLUCOSE 90 129* 101* 83 122*  BUN 6* _2 CREATININE 1.28* 1.48* 1.36* 1.26* 1.11*  CALCIUM 9.0 9.1 8.7* 8.9 9.3  MG 1.1* 1.9 1.5* 1.9 1.6*  PHOS  --  <1.0* 2.6 2.4* 1.9*   GFR: Estimated Creatinine Clearance: 49 mL/min (A) (by C-G formula based on SCr of 1.11 mg/dL (H)). Liver Function Tests: Recent Labs  Lab 04/22/19 0337 04/23/19 0416 04/24/19 0303 04/25/19 0303 04/26/19 0340  AST 27 30 44* 49* 43*  ALT _3 ALKPHOS 138* 150* 175* 199* 210*  BILITOT 0.7 0.4  0.6 0.7 0.6  PROT 6.4* 6.2* 6.3* 6.4* 6.5  ALBUMIN 2.7* 2.6* 2.5* 2.7* 2.8*   Recent Labs  Lab 04/25/19 0303  LIPASE 22   No results for input(s): AMMONIA in the last 168 hours. Coagulation Profile: Recent Labs  Lab 04/21/19 1341  INR 1.2   Cardiac Enzymes: No results for input(s): CKTOTAL, CKMB, CKMBINDEX, TROPONINI in the last 168 hours. BNP (last 3  results) No results for input(s): PROBNP in the last 8760 hours. HbA1C: No results for input(s): HGBA1C in the last 72 hours. CBG: Recent Labs  Lab 04/25/19 1705  GLUCAP 79   Lipid Profile: No results for input(s): CHOL, HDL, LDLCALC, TRIG, CHOLHDL, LDLDIRECT in the last 72 hours. Thyroid Function Tests: No results for input(s): TSH, T4TOTAL, FREET4, T3FREE, THYROIDAB in the last 72 hours. Anemia Panel: No results for input(s): VITAMINB12, FOLATE, FERRITIN, TIBC, IRON, RETICCTPCT in the last 72 hours. Sepsis Labs: Recent Labs  Lab 04/20/19 0359 04/23/19 0416 04/24/19 0303 04/25/19 0303  PROCALCITON 0.41 0.54 0.69 0.65  LATICACIDVEN 1.9  --   --   --     Recent Results (from the past 240 hour(s))  Culture, Urine     Status: Abnormal   Collection Time: 04/17/19  5:51 AM   Specimen: Urine, Clean Catch  Result Value Ref Range Status   Specimen Description URINE, CLEAN CATCH  Final   Special Requests   Final    Normal Performed at [facility], [place] [address], [place], [place] [address]    Culture >=100,000 COLONIES/mL KLEBSIELLA PNEUMONIAE (A)  Final   Report Status 04/19/2019 FINAL  Final   Organism ID, Bacteria KLEBSIELLA PNEUMONIAE (A)  Final      Susceptibility   Klebsiella pneumoniae - MIC*    AMPICILLIN RESISTANT Resistant     CEFAZOLIN <=4 SENSITIVE Sensitive     CEFTRIAXONE <=1 SENSITIVE Sensitive     CIPROFLOXACIN <=0.25 SENSITIVE Sensitive     GENTAMICIN <=1 SENSITIVE Sensitive     IMIPENEM <=0.25 SENSITIVE Sensitive     NITROFURANTOIN 64 INTERMEDIATE Intermediate     TRIMETH/SULFA <=20  SENSITIVE Sensitive     AMPICILLIN/SULBACTAM 4 SENSITIVE Sensitive     PIP/TAZO <=4 SENSITIVE Sensitive     Extended ESBL NEGATIVE Sensitive     * >=100,000 COLONIES/mL KLEBSIELLA PNEUMONIAE  Culture, blood (routine x 2)     Status: None   Collection Time: 04/17/19  6:20 AM   Specimen: BLOOD  Result Value Ref Range Status   Specimen Description BLOOD RIGHT ARM  Final   Special Requests   Final    BOTTLES DRAWN AEROBIC AND ANAEROBIC Blood Culture adequate volume   Culture   Final    NO GROWTH 5 DAYS Performed at [facility], [address], [place], [place] [address]    Report Status 04/22/2019 FINAL  Final  Culture, blood (routine x 2)     Status: None   Collection Time: 04/17/19  6:20 AM   Specimen: BLOOD  Result Value Ref Range Status   Specimen Description BLOOD RIGHT ARM  Final   Special Requests   Final    BOTTLES DRAWN AEROBIC ONLY Blood Culture adequate volume   Culture   Final    NO GROWTH 5 DAYS Performed at [facility], [place] [address], [place], [place] [address]    Report Status 04/22/2019 FINAL  Final  C difficile quick scan w PCR reflex     Status: None   Collection Time: 04/17/19 12:26 PM   Specimen: Rectum; Stool  Result Value Ref Range Status   C Diff antigen NEGATIVE NEGATIVE Final   C Diff toxin NEGATIVE NEGATIVE Final   C Diff interpretation No C. difficile detected.  Final    Comment: Performed at [facility], [place] [address] [place],  [address]  Culture, blood (routine x 2)     Status: None (Preliminary  result)   Collection Time: 04/23/19  8:37 AM   Specimen: BLOOD RIGHT ARM  Result Value Ref Range Status   Specimen Description BLOOD RIGHT ARM  Final   Special Requests   Final    BOTTLES DRAWN AEROBIC ONLY Blood Culture results may not be optimal due to an inadequate volume of blood received in culture bottles   Culture   Final    NO GROWTH 3 DAYS Performed at [facility], [place] [address], [place], [place] [address]     Report Status PENDING  Incomplete  Culture, blood (routine x 2)     Status: None (Preliminary result)   Collection Time: 04/23/19  8:46 AM   Specimen: BLOOD RIGHT HAND  Result Value Ref Range Status   Specimen Description BLOOD RIGHT HAND  Final   Special Requests   Final    BOTTLES DRAWN AEROBIC ONLY Blood Culture results may not be optimal due to an inadequate volume of blood received in culture bottles   Culture   Final    NO GROWTH 3 DAYS Performed at [facility], [place] [address], [place], [place] [address]    Report Status PENDING  Incomplete  Culture, Urine     Status: None   Collection Time: 04/23/19 12:52 PM   Specimen: Urine, Random  Result Value Ref Range Status   Specimen Description URINE, RANDOM  Final   Special Requests NONE  Final   Culture   Final    NO GROWTH Performed at [facility], [place] [address], [place], [place] [address]    Report Status 04/24/2019 FINAL  Final  Culture, Urine     Status: None   Collection Time: 04/23/19  4:33 PM   Specimen: Urine, Random  Result Value Ref Range Status   Specimen Description URINE, RANDOM  Final   Special Requests NONE  Final   Culture   Final    NO GROWTH Performed at [facility], [place] [address] [place], [place] [address]    Report Status 04/24/2019 FINAL  Final  Culture, blood (routine x 2)     Status: None (Preliminary result)   Collection Time: 04/25/19  3:00 PM   Specimen: BLOOD RIGHT HAND  Result Value Ref Range Status   Specimen Description BLOOD RIGHT HAND  Final   Special Requests AEROBIC BOTTLE ONLY Blood Culture adequate volume  Final   Culture   Final    NO GROWTH < 24 HOURS Performed at [facility], [place] [address], [place], [place] [address]    Report Status PENDING  Incomplete  Culture, blood (routine x 2)     Status: None (Preliminary result)   Collection Time: 04/25/19  3:04 PM   Specimen: BLOOD RIGHT WRIST  Result Value Ref Range Status   Specimen Description  BLOOD RIGHT WRIST  Final   Special Requests AEROBIC BOTTLE ONLY Blood Culture adequate volume  Final   Culture   Final    NO GROWTH < 24 HOURS Performed at [facility], [place] [address] [place], [place] [address]    Report Status PENDING  Incomplete    Radiology Studies: Ct Chest W Contrast  Result Date: 04/25/2019 CLINICAL DATA:  Acute pain, dyspnea. EXAM: CT CHEST, ABDOMEN AND PELVIS WITHOUT CONTRAST TECHNIQUE: Multidetector CT imaging of the chest, abdomen and pelvis was performed following the standard protocol without IV contrast. COMPARISON:  Chest radiograph dated 04/23/2019 FINDINGS: CT CHEST FINDINGS Cardiovascular: Vascular calcifications are  seen in the aortic arch. Normal heart size. No pericardial effusion. Mediastinum/Nodes: No enlarged mediastinal, hilar, or axillary lymph nodes. Thyroid gland, trachea, and esophagus demonstrate no significant findings. Lungs/Pleura: There is a small right pleural effusion with associated atelectasis. There is a trace left pleural effusion with associated atelectasis. There is no pneumothorax. Musculoskeletal: No chest wall mass or suspicious bone lesions identified. CT ABDOMEN PELVIS FINDINGS Hepatobiliary: No focal liver abnormality is seen. There is gallbladder wall edema and thickening, measuring 8 mm. Radiodense material in the gallbladder is favored to reflect vicarious excretion of contrast. No biliary dilatation. Pancreas: Unremarkable. No pancreatic ductal dilatation or surrounding inflammatory changes. Spleen: Multiple peripheral wedge-shaped areas of hypoattenuation in the spleen are noted. Adrenals/Urinary Tract: Adrenal glands are unremarkable. Kidneys are normal, without renal calculi, focal lesion, or hydronephrosis. Bladder is unremarkable. Stomach/Bowel: Stomach is within normal limits. Appendix appears normal. No evidence of bowel wall thickening, distention, or inflammatory changes. Enteric contrast reaches the rectum.  Vascular/Lymphatic: No significant vascular findings are present. No enlarged abdominal or pelvic lymph nodes. Reproductive: Uterus and bilateral adnexa are unremarkable. Other: There is moderate volume ascites. No abdominal wall hernia is identified. Musculoskeletal: No acute or significant osseous findings. IMPRESSION: 1. Small right pleural effusion with associated atelectasis. 2. Gallbladder wall edema and thickening is nonspecific but can be seen in the setting of acute cholecystitis. 3. 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 4. Moderate volume ascites. Aortic Atherosclerosis (ICD10-I70.0). These results were called by telephone at the time of interpretation on 04/25/2019 at 8:38 pm to provider [facility] NP, who verbally acknowledged these results. Electronically Signed   By: Zerita Boers M.D.   On: 04/25/2019 20:42   Ct Abdomen Pelvis W Contrast  Result Date: 04/25/2019 CLINICAL DATA:  Acute pain, dyspnea. EXAM: CT CHEST, ABDOMEN AND PELVIS WITHOUT CONTRAST TECHNIQUE: Multidetector CT imaging of the chest, abdomen and pelvis was performed following the standard protocol without IV contrast. COMPARISON:  Chest radiograph dated 04/23/2019 FINDINGS: CT CHEST FINDINGS Cardiovascular: Vascular calcifications are seen in the aortic arch. Normal heart size. No pericardial effusion. Mediastinum/Nodes: No enlarged mediastinal, hilar, or axillary lymph nodes. Thyroid gland, trachea, and esophagus demonstrate no significant findings. Lungs/Pleura: There is a small right pleural effusion with associated atelectasis. There is a trace left pleural effusion with associated atelectasis. There is no pneumothorax. Musculoskeletal: No chest wall mass or suspicious bone lesions identified. CT ABDOMEN PELVIS FINDINGS Hepatobiliary: No focal liver abnormality is seen. There is gallbladder wall edema and thickening, measuring 8 mm. Radiodense material in the  gallbladder is favored to reflect vicarious excretion of contrast. No biliary dilatation. Pancreas: Unremarkable. No pancreatic ductal dilatation or surrounding inflammatory changes. Spleen: Multiple peripheral wedge-shaped areas of hypoattenuation in the spleen are noted. Adrenals/Urinary Tract: Adrenal glands are unremarkable. Kidneys are normal, without renal calculi, focal lesion, or hydronephrosis. Bladder is unremarkable. Stomach/Bowel: Stomach is within normal limits. Appendix appears normal. No evidence of bowel wall thickening, distention, or inflammatory changes. Enteric contrast reaches the rectum. Vascular/Lymphatic: No significant vascular findings are present. No enlarged abdominal or pelvic lymph nodes. Reproductive: Uterus and bilateral adnexa are unremarkable. Other: There is moderate volume ascites. No abdominal wall hernia is identified. Musculoskeletal: No acute or significant osseous findings. IMPRESSION: 1. Small right pleural effusion with associated atelectasis. 2. Gallbladder wall edema and thickening is nonspecific but can be seen in the setting of acute cholecystitis. 3. 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  4. Moderate volume ascites. Aortic Atherosclerosis (ICD10-I70.0). These results were called by telephone at the time of interpretation on 04/25/2019 at 8:38 pm to provider [facility] NP, who verbally acknowledged these results. Electronically Signed   By: Zerita Boers M.D.   On: 04/25/2019 20:42   US Abdomen Limited  Result Date: 04/26/2019 CLINICAL DATA:  Acute pain. CT demonstrates right pleural effusion, gallbladder wall thickening, abdominal ascites EXAM: ULTRASOUND ABDOMEN LIMITED RIGHT UPPER QUADRANT COMPARISON:  CT from earlier the same day FINDINGS: Gallbladder: Incompletely distended. Wall thickening up to 1.3 cm. Innumerable subcentimeter layering calculi in the dependent aspect. Small amount of  pericholecystic fluid. Sonographer describes no sonographic Murphy's sign. Common bile duct: Not demonstrated Liver: Limited visualization. No discrete lesion or biliary ductal dilatation identified. Other: Small volume perihepatic ascites. 4-quadrant survey shows only scattered small volume abdominal ascites. No adequate pocket for safe percutaneous paracentesis. IMPRESSION: 1. Cholelithiasis and gallbladder wall thickening, without sonographic Murphy's sign. 2. Small volume abdominal ascites.  Paracentesis deferred. Electronically Signed   By: Lucrezia Europe M.D.   On: 04/26/2019 13:21   Scheduled Meds:  enoxaparin (LOVENOX) injection  40 mg Subcutaneous P10C   folic acid  1 mg Oral Daily   magnesium oxide  400 mg Oral Daily   multivitamin with minerals  1 tablet Oral Daily   potassium chloride SA  20 mEq Oral Daily   thiamine  100 mg Oral Daily   Or   thiamine  100 mg Intravenous Daily   Continuous Infusions:  sodium phosphate  Dextrose 5% IVPB 20 mmol (04/26/19 1159)    LOS: 9 days   Kerney Elbe, DO [facility] PAGER is on [place]  If 7PM-7AM, please contact night-coverage [email or web address]

## 2019-04-26 NOTE — Progress Notes (Addendum)
Pharmacy Antibiotic Note  Christie Morrison is a 63 y.o. female  On Unasyn for fever/leukocytosis.  Pharmacy consulted to add vancomycin  -WBC= 28.1, tmax= 103.3, SCr= 1.1  Plan: -vancomycin 1250mg  IV q48hr (estimated AUC=448 with SCr= 1.1) -Will follow renal function, cultures and clinical progress   Height: 5\' 10"  (177.8 cm) Weight: 130 lb 4.7 oz (59.1 kg) IBW/kg (Calculated) : 68.5  Temp (24hrs), Avg:98.9 F (37.2 C), Min:97.8 F (36.6 C), Max:99.8 F (37.7 C)  Recent Labs  Lab 04/20/19 0359  04/22/19 0337 04/23/19 0416 04/24/19 0303 04/25/19 0303 04/26/19 0340  WBC 14.4*   < > 20.7* 24.4* 22.9* 25.3* 28.1*  CREATININE 1.13*   < > 1.28* 1.48* 1.36* 1.26* 1.11*  LATICACIDVEN 1.9  --   --   --   --   --   --    < > = values in this interval not displayed.    Estimated Creatinine Clearance: 49 mL/min (A) (by C-G formula based on SCr of 1.11 mg/dL (H)).    No Known Allergies   Thank you for allowing pharmacy to be a part of this patient's care.  Hildred Laser, PharmD Clinical Pharmacist **Pharmacist phone directory can now be found on [email or web address] ([email or web address]).  Listed under [place].

## 2019-04-26 NOTE — Progress Notes (Signed)
Asked by [facility] to review most recent imaging bc pt spiked a temp to 103 yesterday, completed abx course for trich and klebsiella uti; but has worsening wbc, and now c/o right sided abd pain  Initial u/s on 11/30 showed some gb wall thickening, GS, & mild perichole fluid, slight nodular liver concerning for cirrhosis.   At that time pt denied abd pain and exam was nontender and her HIDA was negative for cholecystitis.   Given above, ct performed yesterday (personally reviewed)- pt has a fairly thick GB wall with gallstones, some areas of hypoattenuation in spleen, ascites, but no varices. Repeat u/s today showed GB wall thickening of 1.3 cm, gs and some fluid.   Wbc now 28.  Increasing d dimer, elevated sed rate Hep c negative LFT essentially normal except for ast 44, ap 210 nml coags on admission.   If pt does have RUQ/epigastric pain, I believe wbc, fever could be gallbladder infection given gallstones, and fairly thick gb wall on imaging.   She is childs b (the mod ascites bumps her up) which is 30% perioperative mortality (not sure if this is good risk calculator for her), meld score 9. [facility] cirrhotic 30 day perioperative mortality risk is 3.89%.   She does have evidence of splenic infarcts but no varices on imaging and liver not grossly cirrhotic on CT  Would rec start abx to cover GB pathogen Doubt hepatitis etiologies but will check hep panel Repeat coags in am We will see & exam pt in am  Will d/w dr Georgette Dover in am (our hospital surgeon this week)   Leighton Ruff. Redmond Pulling, MD, FACS General, Bariatric, & Minimally Invasive Surgery [facility], [place]

## 2019-04-26 NOTE — Progress Notes (Addendum)
[facility]         Reason for Consult: fever and leukocytosis   Referring Physician:sheikh  Principal Problem:   Seizures ([place]) Active Problems:   Cocaine abuse ([place])   Cirrhosis, alcoholic ([place])   Hypokalemia   Hypocalcemia   Hypomagnesemia   Prolonged QT interval   Macrocytic anemia   Hyponatremia   AKI (acute kidney injury) ([place])   Seizure ([place])   Klebsiella infection    HPI: Christie Morrison is a 63 y.o. female ETOH cirrhosis admitted with seizure on 11/27 thought to be ETOH withdrawal or electrolyte imbalance with hypokalemia and hypomagnesia associated with diarrhea. cdiff ruled out. She was noted to starting to have fevers of 100.5 in setting of leukocytosis of 14K on 11/30 and empirically started on piptazo. After having NGTD on blood cx, ua, cxr ,and dopplers being negative. abtx stopped. Also had HIDA scan that was negative. abtx stopped on 12/4 but patient still had persistently elevated WBC going from 23->25 ->28K with neutrophil predominance. She had isolated temp of 103F yesterday. Dr Alfredia Ferguson asked ID to be reconsulted. IR did surveillance U/S to see if any ascites to do paracentesis which did not show sufficient fluid. Other labs show cr is stable at 1.1. alk phos elevated at 210.   She underwent CT yesterday that showed GB thickening but she had evidence of splenic infarct  Blood cx from 12/5 ngtd (but only off of abtx for 1 day)  Past Medical History:  Diagnosis Date   Acute hepatitis    Cirrhosis ([place])    Crack cocaine use    Duodenitis    ETOH abuse    High cholesterol    Hypertension    Mallory-Weiss tear    Seizures ([facility])     Allergies: No Known Allergies  HWT:[id number], early satiety  MEDICATIONS:  enoxaparin (LOVENOX) injection  40 mg Subcutaneous K91P   folic acid  1 mg Oral Daily   magnesium oxide  400 mg Oral Daily   multivitamin with minerals  1 tablet Oral Daily   potassium chloride SA  20 mEq  Oral Daily   thiamine  100 mg Oral Daily   Or   thiamine  100 mg Intravenous Daily    Social History   Tobacco Use   Smoking status: Never Smoker   Smokeless tobacco: Never Used  Substance Use Topics   Alcohol use: Yes    Comment: occasionally    Drug use: Not Currently    Frequency: 1.0 times per week    Types: Cocaine    Family History  Problem Relation Age of Onset   Diabetes Mellitus II Mother    Kidney failure Mother    Alcohol abuse Mother    Diabetes Mellitus II Brother    Kidney failure Brother    Alcohol abuse Maternal Grandmother    Hypertension Other      OBJECTIVE: Temp:  [97.8 F (36.6 C)-99.2 F (37.3 C)] 99 F (37.2 C) (12/06 1507) Pulse Rate:  [87-91] 89 (12/06 1507) Resp:  [15-18] 15 (12/06 1507) BP: (124-162)/(78-98) 150/92 (12/06 1507) SpO2:  [98 %-100 %] 98 % (12/06 1507)  Physical Exam  Constitutional:  oriented to person, place, and time. appears well-developed and well-nourished. No distress.  HENT: [place]/AT, PERRLA, no scleral icterus Mouth/Throat: Oropharynx is clear and moist. No oropharyngeal exudate.  Cardiovascular: Normal rate, regular rhythm and normal heart sounds. Exam reveals no gallop and no friction rub.  No murmur heard.  Pulmonary/Chest: Effort normal and breath sounds normal. No respiratory distress.  has no wheezes.  Neck = supple, no nuchal rigidity Abdominal: protuberant. Bowel sounds are decreased.  exhibits no distension. There is no tenderness.  Lymphadenopathy: no cervical adenopathy. No axillary adenopathy Neurological: alert and oriented to person, place, and time.  Skin: Skin is warm and dry. No rash noted. No erythema.  Psychiatric: a normal mood and affect.  behavior is normal.   LABS: Results for orders placed or performed during the hospital encounter of 04/16/19 (from the past 48 hour(s))  Procalcitonin     Status: None   Collection Time: 04/25/19  3:03 AM  Result Value Ref Range    Procalcitonin 0.65 ng/mL    Comment:        Interpretation: PCT > 0.5 ng/mL and <= 2 ng/mL: Systemic infection (sepsis) is possible, but other conditions are known to elevate PCT as well. (NOTE)       Sepsis PCT Algorithm           Lower Respiratory Tract                                      Infection PCT Algorithm    ----------------------------     ----------------------------         PCT < 0.25 ng/mL                PCT < 0.10 ng/mL         Strongly encourage             Strongly discourage   discontinuation of antibiotics    initiation of antibiotics    ----------------------------     -----------------------------       PCT 0.25 - 0.50 ng/mL            PCT 0.10 - 0.25 ng/mL               OR       >80% decrease in PCT            Discourage initiation of                                            antibiotics      Encourage discontinuation           of antibiotics    ----------------------------     -----------------------------         PCT >= 0.50 ng/mL              PCT 0.26 - 0.50 ng/mL                AND       <80% decrease in PCT             Encourage initiation of                                             antibiotics       Encourage continuation           of antibiotics    ----------------------------     -----------------------------        PCT >= 0.50 ng/mL  PCT > 0.50 ng/mL               AND         increase in PCT                  Strongly encourage                                      initiation of antibiotics    Strongly encourage escalation           of antibiotics                                     -----------------------------                                           PCT <= 0.25 ng/mL                                                 OR                                        > 80% decrease in PCT                                     Discontinue / Do not initiate                                             antibiotics Performed at [facility], [address], [place], [place] [address]   D-dimer, quantitative (not at [facility])     Status: Abnormal   Collection Time: 04/25/19  3:03 AM  Result Value Ref Range   D-Dimer, Quant 2.02 (H) 0.00 - 0.50 ug/mL-FEU    Comment: (NOTE) At the manufacturer cut-off of 0.50 ug/mL FEU, this assay has been documented to exclude PE with a sensitivity and negative predictive value of 97 to 99%.  At this time, this assay has not been approved by the FDA to exclude DVT/VTE. Results should be correlated with clinical presentation. Performed at [facility], [place] [address], [place], [place] [address]   Sedimentation rate     Status: Abnormal   Collection Time: 04/25/19  3:03 AM  Result Value Ref Range   Sed Rate 99 (H) 0 - 22 mm/hr    Comment: Performed at [facility] [address] [place], [place] [address]  C-reactive protein     Status: Abnormal   Collection Time: 04/25/19  3:03 AM  Result Value Ref Range   CRP 6.7 (H) <1.0 mg/dL    Comment: Performed at [facility] [address], [place], [place] [address]  Lactate dehydrogenase     Status: None   Collection Time: 04/25/19  3:03 AM  Result Value Ref Range   LDH 161 98 - 192 U/L    Comment: Performed at [facility], [place] [address], [place], [place] [address]  Lipase, blood     Status: None   Collection Time: 04/25/19  3:03 AM  Result Value Ref Range   Lipase 22 11 - 51 U/L    Comment: Performed at [facility] [address] [place], [place] [address]  Magnesium     Status: None   Collection Time: 04/25/19  3:03 AM  Result Value Ref Range   Magnesium 1.9 1.7 - 2.4 mg/dL    Comment: Performed at [facility] [address], [place], [place] [address]  Phosphorus     Status: Abnormal   Collection Time: 04/25/19  3:03 AM  Result Value Ref Range   Phosphorus 2.4 (L) 2.5 - 4.6 mg/dL    Comment: Performed at [facility] [address], [place], [place] [address]  Comprehensive  metabolic panel     Status: Abnormal   Collection Time: 04/25/19  3:03 AM  Result Value Ref Range   Sodium 137 135 - 145 mmol/L   Potassium 4.2 3.5 - 5.1 mmol/L   Chloride 102 98 - 111 mmol/L   CO2 25 22 - 32 mmol/L   Glucose, Bld 83 70 - 99 mg/dL   BUN 10 8 - 23 mg/dL   Creatinine, Ser 1.26 (H) 0.44 - 1.00 mg/dL   Calcium 8.9 8.9 - 10.3 mg/dL   Total Protein 6.4 (L) 6.5 - 8.1 g/dL   Albumin 2.7 (L) 3.5 - 5.0 g/dL   AST 49 (H) 15 - 41 U/L   ALT 24 0 - 44 U/L   Alkaline Phosphatase 199 (H) 38 - 126 U/L   Total Bilirubin 0.7 0.3 - 1.2 mg/dL   GFR calc non Af Amer 46 (L) >60 mL/min   GFR calc Af Amer 53 (L) >60 mL/min   Anion gap 10 5 - 15    Comment: Performed at [facility] [address], [place],  [address]  CBC with Differential/Platelet     Status: Abnormal   Collection Time: 04/25/19  3:03 AM  Result Value Ref Range   WBC 25.3 (H) 4.0 - 10.5 K/uL    Comment: REPEATED TO VERIFY WHITE COUNT CONFIRMED ON SMEAR    RBC 2.24 (L) 3.87 - 5.11 MIL/uL   Hemoglobin 7.7 (L) 12.0 - 15.0 g/dL   HCT 22.4 (L) 36.0 - 46.0 %   MCV 100.0 80.0 - 100.0 fL   MCH 34.4 (H) 26.0 - 34.0 pg   MCHC 34.4 30.0 - 36.0 g/dL   RDW 16.3 (H) 11.5 - 15.5 %   Platelets 388 150 - 400 K/uL   nRBC 0.0 0.0 - 0.2 %   Neutrophils Relative % 79 %   Neutro Abs 19.9 (H) 1.7 - 7.7 K/uL   Lymphocytes Relative 10 %   Lymphs Abs 2.6 0.7 - 4.0 K/uL   Monocytes Relative 7 %   Monocytes Absolute 1.8 (H) 0.1 - 1.0 K/uL   Eosinophils Relative 3 %   Eosinophils Absolute 0.6 (H) 0.0 - 0.5 K/uL   Basophils Relative 0 %   Basophils Absolute 0.1 0.0 - 0.1 K/uL   WBC Morphology TOXIC GRANULATION     Comment: VACUOLATED NEUTROPHILS   Immature Granulocytes 1 %   Abs Immature Granulocytes 0.32 (H) 0.00 - 0.07 K/uL   Reactive, Benign Lymphocytes PRESENT  Polychromasia PRESENT    Target Cells PRESENT     Comment: Performed at [facility], [place] [address] [place], [place] [address]  Culture, blood  (routine x 2)     Status: None (Preliminary result)   Collection Time: 04/25/19  3:00 PM   Specimen: BLOOD RIGHT HAND  Result Value Ref Range   Specimen Description BLOOD RIGHT HAND    Special Requests AEROBIC BOTTLE ONLY Blood Culture adequate volume    Culture      NO GROWTH < 24 HOURS Performed at [facility] [address], [place], [place] [address]    Report Status PENDING   Culture, blood (routine x 2)     Status: None (Preliminary result)   Collection Time: 04/25/19  3:04 PM   Specimen: BLOOD RIGHT WRIST  Result Value Ref Range   Specimen Description BLOOD RIGHT WRIST    Special Requests AEROBIC BOTTLE ONLY Blood Culture adequate volume    Culture      NO GROWTH < 24 HOURS Performed at [facility], [place] [address] [place], [place] [address]    Report Status PENDING   Glucose, capillary     Status: None   Collection Time: 04/25/19  5:05 PM  Result Value Ref Range   Glucose-Capillary 79 70 - 99 mg/dL  D-dimer, quantitative (not at [facility])     Status: Abnormal   Collection Time: 04/26/19  3:40 AM  Result Value Ref Range   D-Dimer, Quant 3.22 (H) 0.00 - 0.50 ug/mL-FEU    Comment: (NOTE) At the manufacturer cut-off of 0.50 ug/mL FEU, this assay has been documented to exclude PE with a sensitivity and negative predictive value of 97 to 99%.  At this time, this assay has not been approved by the FDA to exclude DVT/VTE. Results should be correlated with clinical presentation. Performed at [facility], [place] [address] [place], [place] [address]   Sedimentation rate     Status: Abnormal   Collection Time: 04/26/19  3:40 AM  Result Value Ref Range   Sed Rate 89 (H) 0 - 22 mm/hr    Comment: Performed at [facility] [address] [place], [place] [address]  C-reactive protein     Status: Abnormal   Collection Time: 04/26/19  3:40 AM  Result Value Ref Range   CRP 8.8 (H) <1.0 mg/dL    Comment: Performed at [facility] [address], [place], [place] [address]  Lactate dehydrogenase     Status: Abnormal   Collection Time: 04/26/19  3:40 AM  Result Value Ref Range   LDH 195 (H) 98 - 192 U/L    Comment: Performed at [facility], [place] [address], [place], [place] [address]  Magnesium     Status: Abnormal   Collection Time: 04/26/19  3:40 AM  Result Value Ref Range   Magnesium 1.6 (L) 1.7 - 2.4 mg/dL    Comment: Performed at [facility] [address] [place], [place] [address]  Phosphorus     Status: Abnormal   Collection Time: 04/26/19  3:40 AM  Result Value Ref Range   Phosphorus 1.9 (L) 2.5 - 4.6 mg/dL    Comment: Performed at [facility] [address] [place], [place] [address]  Comprehensive metabolic panel     Status: Abnormal   Collection Time: 04/26/19  3:40 AM  Result Value Ref Range   Sodium 138 135 -  145 mmol/L   Potassium 4.0 3.5 - 5.1 mmol/L   Chloride 106 98 - 111 mmol/L   CO2 24 22 - 32 mmol/L   Glucose, Bld 122 (H) 70 - 99 mg/dL   BUN 8 8 - 23 mg/dL   Creatinine, Ser 1.11 (H) 0.44 - 1.00 mg/dL   Calcium 9.3 8.9 - 10.3 mg/dL   Total Protein 6.5 6.5 - 8.1 g/dL   Albumin 2.8 (L) 3.5 - 5.0 g/dL   AST 43 (H) 15 - 41 U/L   ALT 25 0 - 44 U/L   Alkaline Phosphatase 210 (H) 38 - 126 U/L   Total Bilirubin 0.6 0.3 - 1.2 mg/dL   GFR calc non Af Amer 53 (L) >60 mL/min   GFR calc Af Amer >60 >60 mL/min   Anion gap 8 5 - 15    Comment: Performed at [facility] [address] [place], [place] [address]  CBC with Differential/Platelet     Status: Abnormal   Collection Time: 04/26/19  3:40 AM  Result Value Ref Range   WBC 28.1 (H) 4.0 - 10.5 K/uL    Comment: REPEATED TO VERIFY WHITE COUNT CONFIRMED ON SMEAR    RBC 2.38 (L) 3.87 - 5.11 MIL/uL   Hemoglobin 8.0 (L) 12.0 - 15.0 g/dL   HCT 24.2 (L) 36.0 - 46.0 %   MCV 101.7 (H) 80.0 - 100.0 fL   MCH 33.6 26.0 - 34.0 pg   MCHC 33.1 30.0 - 36.0 g/dL   RDW 16.8 (H) 11.5 - 15.5 %   Platelets 394 150 - 400 K/uL   nRBC 0.0 0.0  - 0.2 %   Neutrophils Relative % 87 %   Neutro Abs 24.4 (H) 1.7 - 7.7 K/uL   Lymphocytes Relative 8 %   Lymphs Abs 2.2 0.7 - 4.0 K/uL   Monocytes Relative 3 %   Monocytes Absolute 0.8 0.1 - 1.0 K/uL   Eosinophils Relative 2 %   Eosinophils Absolute 0.6 (H) 0.0 - 0.5 K/uL   Basophils Relative 0 %   Basophils Absolute 0.0 0.0 - 0.1 K/uL   Abs Immature Granulocytes 0.00 0.00 - 0.07 K/uL   Polychromasia PRESENT     Comment: Performed at [facility], [place] [address], [place], [place] [address]    MICRO: reivewed IMAGING: Ct Chest W Contrast  Result Date: 04/25/2019 CLINICAL DATA:  Acute pain, dyspnea. EXAM: CT CHEST, ABDOMEN AND PELVIS WITHOUT CONTRAST TECHNIQUE: Multidetector CT imaging of the chest, abdomen and pelvis was performed following the standard protocol without IV contrast. COMPARISON:  Chest radiograph dated 04/23/2019 FINDINGS: CT CHEST FINDINGS Cardiovascular: Vascular calcifications are seen in the aortic arch. Normal heart size. No pericardial effusion. Mediastinum/Nodes: No enlarged mediastinal, hilar, or axillary lymph nodes. Thyroid gland, trachea, and esophagus demonstrate no significant findings. Lungs/Pleura: There is a small right pleural effusion with associated atelectasis. There is a trace left pleural effusion with associated atelectasis. There is no pneumothorax. Musculoskeletal: No chest wall mass or suspicious bone lesions identified. CT ABDOMEN PELVIS FINDINGS Hepatobiliary: No focal liver abnormality is seen. There is gallbladder wall edema and thickening, measuring 8 mm. Radiodense material in the gallbladder is favored to reflect vicarious excretion of contrast. No biliary dilatation. Pancreas: Unremarkable. No pancreatic ductal dilatation or surrounding inflammatory changes. Spleen: Multiple peripheral wedge-shaped areas of hypoattenuation in the spleen are noted. Adrenals/Urinary Tract: Adrenal glands are unremarkable. Kidneys are normal, without renal  calculi, focal lesion, or hydronephrosis. Bladder is unremarkable. Stomach/Bowel: Stomach  is within normal limits. Appendix appears normal. No evidence of bowel wall thickening, distention, or inflammatory changes. Enteric contrast reaches the rectum. Vascular/Lymphatic: No significant vascular findings are present. No enlarged abdominal or pelvic lymph nodes. Reproductive: Uterus and bilateral adnexa are unremarkable. Other: There is moderate volume ascites. No abdominal wall hernia is identified. Musculoskeletal: No acute or significant osseous findings. IMPRESSION: 1. Small right pleural effusion with associated atelectasis. 2. Gallbladder wall edema and thickening is nonspecific but can be seen in the setting of acute cholecystitis. 3. 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 4. Moderate volume ascites. Aortic Atherosclerosis (ICD10-I70.0). These results were called by telephone at the time of interpretation on 04/25/2019 at 8:38 pm to provider [facility] NP, who verbally acknowledged these results. Electronically Signed   By: Zerita Boers M.D.   On: 04/25/2019 20:42   Ct Abdomen Pelvis W Contrast  Result Date: 04/25/2019 CLINICAL DATA:  Acute pain, dyspnea. EXAM: CT CHEST, ABDOMEN AND PELVIS WITHOUT CONTRAST TECHNIQUE: Multidetector CT imaging of the chest, abdomen and pelvis was performed following the standard protocol without IV contrast. COMPARISON:  Chest radiograph dated 04/23/2019 FINDINGS: CT CHEST FINDINGS Cardiovascular: Vascular calcifications are seen in the aortic arch. Normal heart size. No pericardial effusion. Mediastinum/Nodes: No enlarged mediastinal, hilar, or axillary lymph nodes. Thyroid gland, trachea, and esophagus demonstrate no significant findings. Lungs/Pleura: There is a small right pleural effusion with associated atelectasis. There is a trace left pleural effusion with associated atelectasis. There is no  pneumothorax. Musculoskeletal: No chest wall mass or suspicious bone lesions identified. CT ABDOMEN PELVIS FINDINGS Hepatobiliary: No focal liver abnormality is seen. There is gallbladder wall edema and thickening, measuring 8 mm. Radiodense material in the gallbladder is favored to reflect vicarious excretion of contrast. No biliary dilatation. Pancreas: Unremarkable. No pancreatic ductal dilatation or surrounding inflammatory changes. Spleen: Multiple peripheral wedge-shaped areas of hypoattenuation in the spleen are noted. Adrenals/Urinary Tract: Adrenal glands are unremarkable. Kidneys are normal, without renal calculi, focal lesion, or hydronephrosis. Bladder is unremarkable. Stomach/Bowel: Stomach is within normal limits. Appendix appears normal. No evidence of bowel wall thickening, distention, or inflammatory changes. Enteric contrast reaches the rectum. Vascular/Lymphatic: No significant vascular findings are present. No enlarged abdominal or pelvic lymph nodes. Reproductive: Uterus and bilateral adnexa are unremarkable. Other: There is moderate volume ascites. No abdominal wall hernia is identified. Musculoskeletal: No acute or significant osseous findings. IMPRESSION: 1. Small right pleural effusion with associated atelectasis. 2. Gallbladder wall edema and thickening is nonspecific but can be seen in the setting of acute cholecystitis. 3. 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 4. Moderate volume ascites. Aortic Atherosclerosis (ICD10-I70.0). These results were called by telephone at the time of interpretation on 04/25/2019 at 8:38 pm to provider [facility] NP, who verbally acknowledged these results. Electronically Signed   By: Zerita Boers M.D.   On: 04/25/2019 20:42   US Abdomen Limited  Result Date: 04/26/2019 CLINICAL DATA:  Acute pain. CT demonstrates right pleural effusion, gallbladder wall thickening, abdominal ascites EXAM:  ULTRASOUND ABDOMEN LIMITED RIGHT UPPER QUADRANT COMPARISON:  CT from earlier the same day FINDINGS: Gallbladder: Incompletely distended. Wall thickening up to 1.3 cm. Innumerable subcentimeter layering calculi in the dependent aspect. Small amount of pericholecystic fluid. Sonographer describes no sonographic Murphy's sign. Common bile duct: Not demonstrated Liver: Limited visualization. No discrete lesion or biliary ductal dilatation identified. Other: Small volume perihepatic ascites. 4-quadrant survey shows only scattered small volume abdominal ascites.  No adequate pocket for safe percutaneous paracentesis. IMPRESSION: 1. Cholelithiasis and gallbladder wall thickening, without sonographic Murphy's sign. 2. Small volume abdominal ascites.  Paracentesis deferred. Electronically Signed   By: Lucrezia Europe M.D.   On: 04/26/2019 13:21    Assessment/Plan:  - wil repeat blood cx today - start on amp/sub to cover GI pathogens plus vancomycin - splenic infarcts concerning for endocarditis - please get TEE - consider repeat HIDA scan for GI source  Diarrhea = appears non infectious. Continue with supportive care  More recommendations to follow tomorrow

## 2019-04-26 NOTE — H&P (View-Only) (Signed)
[facility]         Reason for Consult: fever and leukocytosis   Referring Physician:sheikh  Principal Problem:   Seizures ([place]) Active Problems:   Cocaine abuse ([place])   Cirrhosis, alcoholic ([place])   Hypokalemia   Hypocalcemia   Hypomagnesemia   Prolonged QT interval   Macrocytic anemia   Hyponatremia   AKI (acute kidney injury) ([place])   Seizure ([place])   Klebsiella infection    HPI: Christie Morrison is a 63 y.o. female ETOH cirrhosis admitted with seizure on 11/27 thought to be ETOH withdrawal or electrolyte imbalance with hypokalemia and hypomagnesia associated with diarrhea. cdiff ruled out. She was noted to starting to have fevers of 100.5 in setting of leukocytosis of 14K on 11/30 and empirically started on piptazo. After having NGTD on blood cx, ua, cxr ,and dopplers being negative. abtx stopped. Also had HIDA scan that was negative. abtx stopped on 12/4 but patient still had persistently elevated WBC going from 23->25 ->28K with neutrophil predominance. She had isolated temp of 103F yesterday. Dr Alfredia Ferguson asked ID to be reconsulted. IR did surveillance U/S to see if any ascites to do paracentesis which did not show sufficient fluid. Other labs show cr is stable at 1.1. alk phos elevated at 210.   She underwent CT yesterday that showed GB thickening but she had evidence of splenic infarct  Blood cx from 12/5 ngtd (but only off of abtx for 1 day)  Past Medical History:  Diagnosis Date   Acute hepatitis    Cirrhosis ([place])    Crack cocaine use    Duodenitis    ETOH abuse    High cholesterol    Hypertension    Mallory-Weiss tear    Seizures ([facility])     Allergies: No Known Allergies  FOY:DXAJOINO, early satiety  MEDICATIONS:  enoxaparin (LOVENOX) injection  40 mg Subcutaneous M76H   folic acid  1 mg Oral Daily   magnesium oxide  400 mg Oral Daily   multivitamin with minerals  1 tablet Oral Daily   potassium chloride SA  20 mEq  Oral Daily   thiamine  100 mg Oral Daily   Or   thiamine  100 mg Intravenous Daily    Social History   Tobacco Use   Smoking status: Never Smoker   Smokeless tobacco: Never Used  Substance Use Topics   Alcohol use: Yes    Comment: occasionally    Drug use: Not Currently    Frequency: 1.0 times per week    Types: Cocaine    Family History  Problem Relation Age of Onset   Diabetes Mellitus II Mother    Kidney failure Mother    Alcohol abuse Mother    Diabetes Mellitus II Brother    Kidney failure Brother    Alcohol abuse Maternal Grandmother    Hypertension Other      OBJECTIVE: Temp:  [97.8 F (36.6 C)-99.2 F (37.3 C)] 99 F (37.2 C) (12/06 1507) Pulse Rate:  [87-91] 89 (12/06 1507) Resp:  [15-18] 15 (12/06 1507) BP: (124-162)/(78-98) 150/92 (12/06 1507) SpO2:  [98 %-100 %] 98 % (12/06 1507)  Physical Exam  Constitutional:  oriented to person, place, and time. appears well-developed and well-nourished. No distress.  HENT: [place]/AT, PERRLA, no scleral icterus Mouth/Throat: Oropharynx is clear and moist. No oropharyngeal exudate.  Cardiovascular: Normal rate, regular rhythm and normal heart sounds. Exam reveals no gallop and no friction rub.  No murmur heard.  Pulmonary/Chest: Effort normal and breath sounds normal. No respiratory distress.  has no wheezes.  Neck = supple, no nuchal rigidity Abdominal: protuberant. Bowel sounds are decreased.  exhibits no distension. There is no tenderness.  Lymphadenopathy: no cervical adenopathy. No axillary adenopathy Neurological: alert and oriented to person, place, and time.  Skin: Skin is warm and dry. No rash noted. No erythema.  Psychiatric: a normal mood and affect.  behavior is normal.   LABS: Results for orders placed or performed during the hospital encounter of 04/16/19 (from the past 48 hour(s))  Procalcitonin     Status: None   Collection Time: 04/25/19  3:03 AM  Result Value Ref Range    Procalcitonin 0.65 ng/mL    Comment:        Interpretation: PCT > 0.5 ng/mL and <= 2 ng/mL: Systemic infection (sepsis) is possible, but other conditions are known to elevate PCT as well. (NOTE)       Sepsis PCT Algorithm           Lower Respiratory Tract                                      Infection PCT Algorithm    ----------------------------     ----------------------------         PCT < 0.25 ng/mL                PCT < 0.10 ng/mL         Strongly encourage             Strongly discourage   discontinuation of antibiotics    initiation of antibiotics    ----------------------------     -----------------------------       PCT 0.25 - 0.50 ng/mL            PCT 0.10 - 0.25 ng/mL               OR       >80% decrease in PCT            Discourage initiation of                                            antibiotics      Encourage discontinuation           of antibiotics    ----------------------------     -----------------------------         PCT >= 0.50 ng/mL              PCT 0.26 - 0.50 ng/mL                AND       <80% decrease in PCT             Encourage initiation of                                             antibiotics       Encourage continuation           of antibiotics    ----------------------------     -----------------------------        PCT >= 0.50 ng/mL  PCT > 0.50 ng/mL               AND         increase in PCT                  Strongly encourage                                      initiation of antibiotics    Strongly encourage escalation           of antibiotics                                     -----------------------------                                           PCT <= 0.25 ng/mL                                                 OR                                        > 80% decrease in PCT                                     Discontinue / Do not initiate                                             antibiotics Performed at [facility], [address] [place], [place] [address]   D-dimer, quantitative (not at [facility])     Status: Abnormal   Collection Time: 04/25/19  3:03 AM  Result Value Ref Range   D-Dimer, Quant 2.02 (H) 0.00 - 0.50 ug/mL-FEU    Comment: (NOTE) At the manufacturer cut-off of 0.50 ug/mL FEU, this assay has been documented to exclude PE with a sensitivity and negative predictive value of 97 to 99%.  At this time, this assay has not been approved by the FDA to exclude DVT/VTE. Results should be correlated with clinical presentation. Performed at [facility], [place] [address], [place], [place] [address]   Sedimentation rate     Status: Abnormal   Collection Time: 04/25/19  3:03 AM  Result Value Ref Range   Sed Rate 99 (H) 0 - 22 mm/hr    Comment: Performed at [facility] [address], [place], [place] [address]  C-reactive protein     Status: Abnormal   Collection Time: 04/25/19  3:03 AM  Result Value Ref Range   CRP 6.7 (H) <1.0 mg/dL    Comment: Performed at [facility] [address] [place], [place] [address]  Lactate dehydrogenase     Status: None   Collection Time: 04/25/19  3:03 AM  Result Value Ref Range   LDH 161 98 - 192 U/L    Comment: Performed at [facility], [place] [address] [place], [place] [address]  Lipase, blood     Status: None   Collection Time: 04/25/19  3:03 AM  Result Value Ref Range   Lipase 22 11 - 51 U/L    Comment: Performed at [facility] [address], [place], [place] [address]  Magnesium     Status: None   Collection Time: 04/25/19  3:03 AM  Result Value Ref Range   Magnesium 1.9 1.7 - 2.4 mg/dL    Comment: Performed at [facility] [address], [place], [place] [address]  Phosphorus     Status: Abnormal   Collection Time: 04/25/19  3:03 AM  Result Value Ref Range   Phosphorus 2.4 (L) 2.5 - 4.6 mg/dL    Comment: Performed at [facility] [address], [place], [place] [address]  Comprehensive  metabolic panel     Status: Abnormal   Collection Time: 04/25/19  3:03 AM  Result Value Ref Range   Sodium 137 135 - 145 mmol/L   Potassium 4.2 3.5 - 5.1 mmol/L   Chloride 102 98 - 111 mmol/L   CO2 25 22 - 32 mmol/L   Glucose, Bld 83 70 - 99 mg/dL   BUN 10 8 - 23 mg/dL   Creatinine, Ser 1.26 (H) 0.44 - 1.00 mg/dL   Calcium 8.9 8.9 - 10.3 mg/dL   Total Protein 6.4 (L) 6.5 - 8.1 g/dL   Albumin 2.7 (L) 3.5 - 5.0 g/dL   AST 49 (H) 15 - 41 U/L   ALT 24 0 - 44 U/L   Alkaline Phosphatase 199 (H) 38 - 126 U/L   Total Bilirubin 0.7 0.3 - 1.2 mg/dL   GFR calc non Af Amer 46 (L) >60 mL/min   GFR calc Af Amer 53 (L) >60 mL/min   Anion gap 10 5 - 15    Comment: Performed at [facility] [address], [place], [place] [address]  CBC with Differential/Platelet     Status: Abnormal   Collection Time: 04/25/19  3:03 AM  Result Value Ref Range   WBC 25.3 (H) 4.0 - 10.5 K/uL    Comment: REPEATED TO VERIFY WHITE COUNT CONFIRMED ON SMEAR    RBC 2.24 (L) 3.87 - 5.11 MIL/uL   Hemoglobin 7.7 (L) 12.0 - 15.0 g/dL   HCT 22.4 (L) 36.0 - 46.0 %   MCV 100.0 80.0 - 100.0 fL   MCH 34.4 (H) 26.0 - 34.0 pg   MCHC 34.4 30.0 - 36.0 g/dL   RDW 16.3 (H) 11.5 - 15.5 %   Platelets 388 150 - 400 K/uL   nRBC 0.0 0.0 - 0.2 %   Neutrophils Relative % 79 %   Neutro Abs 19.9 (H) 1.7 - 7.7 K/uL   Lymphocytes Relative 10 %   Lymphs Abs 2.6 0.7 - 4.0 K/uL   Monocytes Relative 7 %   Monocytes Absolute 1.8 (H) 0.1 - 1.0 K/uL   Eosinophils Relative 3 %   Eosinophils Absolute 0.6 (H) 0.0 - 0.5 K/uL   Basophils Relative 0 %   Basophils Absolute 0.1 0.0 - 0.1 K/uL   WBC Morphology TOXIC GRANULATION     Comment: VACUOLATED NEUTROPHILS   Immature Granulocytes 1 %   Abs Immature Granulocytes 0.32 (H) 0.00 - 0.07 K/uL   Reactive, Benign Lymphocytes PRESENT  Polychromasia PRESENT    Target Cells PRESENT     Comment: Performed at [facility], [place] [address], [place], [place] [address]  Culture, blood  (routine x 2)     Status: None (Preliminary result)   Collection Time: 04/25/19  3:00 PM   Specimen: BLOOD RIGHT HAND  Result Value Ref Range   Specimen Description BLOOD RIGHT HAND    Special Requests AEROBIC BOTTLE ONLY Blood Culture adequate volume    Culture      NO GROWTH < 24 HOURS Performed at [facility] [address], [place], [place] [address]    Report Status PENDING   Culture, blood (routine x 2)     Status: None (Preliminary result)   Collection Time: 04/25/19  3:04 PM   Specimen: BLOOD RIGHT WRIST  Result Value Ref Range   Specimen Description BLOOD RIGHT WRIST    Special Requests AEROBIC BOTTLE ONLY Blood Culture adequate volume    Culture      NO GROWTH < 24 HOURS Performed at [facility], [place] [address], [place], [place] [address]    Report Status PENDING   Glucose, capillary     Status: None   Collection Time: 04/25/19  5:05 PM  Result Value Ref Range   Glucose-Capillary 79 70 - 99 mg/dL  D-dimer, quantitative (not at [facility])     Status: Abnormal   Collection Time: 04/26/19  3:40 AM  Result Value Ref Range   D-Dimer, Quant 3.22 (H) 0.00 - 0.50 ug/mL-FEU    Comment: (NOTE) At the manufacturer cut-off of 0.50 ug/mL FEU, this assay has been documented to exclude PE with a sensitivity and negative predictive value of 97 to 99%.  At this time, this assay has not been approved by the FDA to exclude DVT/VTE. Results should be correlated with clinical presentation. Performed at [facility], [place] [address] [place], [place] [address]   Sedimentation rate     Status: Abnormal   Collection Time: 04/26/19  3:40 AM  Result Value Ref Range   Sed Rate 89 (H) 0 - 22 mm/hr    Comment: Performed at [facility] [address], [place], [place] [address]  C-reactive protein     Status: Abnormal   Collection Time: 04/26/19  3:40 AM  Result Value Ref Range   CRP 8.8 (H) <1.0 mg/dL    Comment: Performed at [facility] [address], [place], [place] [address]  Lactate dehydrogenase     Status: Abnormal   Collection Time: 04/26/19  3:40 AM  Result Value Ref Range   LDH 195 (H) 98 - 192 U/L    Comment: Performed at [facility], [place] [address], [place], [place] [address]  Magnesium     Status: Abnormal   Collection Time: 04/26/19  3:40 AM  Result Value Ref Range   Magnesium 1.6 (L) 1.7 - 2.4 mg/dL    Comment: Performed at [facility] [address], [place], [place] [address]  Phosphorus     Status: Abnormal   Collection Time: 04/26/19  3:40 AM  Result Value Ref Range   Phosphorus 1.9 (L) 2.5 - 4.6 mg/dL    Comment: Performed at [facility] [address], [place],  [address]  Comprehensive metabolic panel     Status: Abnormal   Collection Time: 04/26/19  3:40 AM  Result Value Ref Range   Sodium 138 135 -  145 mmol/L   Potassium 4.0 3.5 - 5.1 mmol/L   Chloride 106 98 - 111 mmol/L   CO2 24 22 - 32 mmol/L   Glucose, Bld 122 (H) 70 - 99 mg/dL   BUN 8 8 - 23 mg/dL   Creatinine, Ser 1.11 (H) 0.44 - 1.00 mg/dL   Calcium 9.3 8.9 - 10.3 mg/dL   Total Protein 6.5 6.5 - 8.1 g/dL   Albumin 2.8 (L) 3.5 - 5.0 g/dL   AST 43 (H) 15 - 41 U/L   ALT 25 0 - 44 U/L   Alkaline Phosphatase 210 (H) 38 - 126 U/L   Total Bilirubin 0.6 0.3 - 1.2 mg/dL   GFR calc non Af Amer 53 (L) >60 mL/min   GFR calc Af Amer >60 >60 mL/min   Anion gap 8 5 - 15    Comment: Performed at [facility] [address] [place], [place] [address]  CBC with Differential/Platelet     Status: Abnormal   Collection Time: 04/26/19  3:40 AM  Result Value Ref Range   WBC 28.1 (H) 4.0 - 10.5 K/uL    Comment: REPEATED TO VERIFY WHITE COUNT CONFIRMED ON SMEAR    RBC 2.38 (L) 3.87 - 5.11 MIL/uL   Hemoglobin 8.0 (L) 12.0 - 15.0 g/dL   HCT 24.2 (L) 36.0 - 46.0 %   MCV 101.7 (H) 80.0 - 100.0 fL   MCH 33.6 26.0 - 34.0 pg   MCHC 33.1 30.0 - 36.0 g/dL   RDW 16.8 (H) 11.5 - 15.5 %   Platelets 394 150 - 400 K/uL   nRBC 0.0 0.0  - 0.2 %   Neutrophils Relative % 87 %   Neutro Abs 24.4 (H) 1.7 - 7.7 K/uL   Lymphocytes Relative 8 %   Lymphs Abs 2.2 0.7 - 4.0 K/uL   Monocytes Relative 3 %   Monocytes Absolute 0.8 0.1 - 1.0 K/uL   Eosinophils Relative 2 %   Eosinophils Absolute 0.6 (H) 0.0 - 0.5 K/uL   Basophils Relative 0 %   Basophils Absolute 0.0 0.0 - 0.1 K/uL   Abs Immature Granulocytes 0.00 0.00 - 0.07 K/uL   Polychromasia PRESENT     Comment: Performed at [facility], [place] [address] [place], [place] [address]    MICRO: reivewed IMAGING: Ct Chest W Contrast  Result Date: 04/25/2019 CLINICAL DATA:  Acute pain, dyspnea. EXAM: CT CHEST, ABDOMEN AND PELVIS WITHOUT CONTRAST TECHNIQUE: Multidetector CT imaging of the chest, abdomen and pelvis was performed following the standard protocol without IV contrast. COMPARISON:  Chest radiograph dated 04/23/2019 FINDINGS: CT CHEST FINDINGS Cardiovascular: Vascular calcifications are seen in the aortic arch. Normal heart size. No pericardial effusion. Mediastinum/Nodes: No enlarged mediastinal, hilar, or axillary lymph nodes. Thyroid gland, trachea, and esophagus demonstrate no significant findings. Lungs/Pleura: There is a small right pleural effusion with associated atelectasis. There is a trace left pleural effusion with associated atelectasis. There is no pneumothorax. Musculoskeletal: No chest wall mass or suspicious bone lesions identified. CT ABDOMEN PELVIS FINDINGS Hepatobiliary: No focal liver abnormality is seen. There is gallbladder wall edema and thickening, measuring 8 mm. Radiodense material in the gallbladder is favored to reflect vicarious excretion of contrast. No biliary dilatation. Pancreas: Unremarkable. No pancreatic ductal dilatation or surrounding inflammatory changes. Spleen: Multiple peripheral wedge-shaped areas of hypoattenuation in the spleen are noted. Adrenals/Urinary Tract: Adrenal glands are unremarkable. Kidneys are normal, without renal  calculi, focal lesion, or hydronephrosis. Bladder is unremarkable. Stomach/Bowel: Stomach  is within normal limits. Appendix appears normal. No evidence of bowel wall thickening, distention, or inflammatory changes. Enteric contrast reaches the rectum. Vascular/Lymphatic: No significant vascular findings are present. No enlarged abdominal or pelvic lymph nodes. Reproductive: Uterus and bilateral adnexa are unremarkable. Other: There is moderate volume ascites. No abdominal wall hernia is identified. Musculoskeletal: No acute or significant osseous findings. IMPRESSION: 1. Small right pleural effusion with associated atelectasis. 2. Gallbladder wall edema and thickening is nonspecific but can be seen in the setting of acute cholecystitis. 3. 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 4. Moderate volume ascites. Aortic Atherosclerosis (ICD10-I70.0). These results were called by telephone at the time of interpretation on 04/25/2019 at 8:38 pm to provider [facility] NP, who verbally acknowledged these results. Electronically Signed   By: Zerita Boers M.D.   On: 04/25/2019 20:42   Ct Abdomen Pelvis W Contrast  Result Date: 04/25/2019 CLINICAL DATA:  Acute pain, dyspnea. EXAM: CT CHEST, ABDOMEN AND PELVIS WITHOUT CONTRAST TECHNIQUE: Multidetector CT imaging of the chest, abdomen and pelvis was performed following the standard protocol without IV contrast. COMPARISON:  Chest radiograph dated 04/23/2019 FINDINGS: CT CHEST FINDINGS Cardiovascular: Vascular calcifications are seen in the aortic arch. Normal heart size. No pericardial effusion. Mediastinum/Nodes: No enlarged mediastinal, hilar, or axillary lymph nodes. Thyroid gland, trachea, and esophagus demonstrate no significant findings. Lungs/Pleura: There is a small right pleural effusion with associated atelectasis. There is a trace left pleural effusion with associated atelectasis. There is no  pneumothorax. Musculoskeletal: No chest wall mass or suspicious bone lesions identified. CT ABDOMEN PELVIS FINDINGS Hepatobiliary: No focal liver abnormality is seen. There is gallbladder wall edema and thickening, measuring 8 mm. Radiodense material in the gallbladder is favored to reflect vicarious excretion of contrast. No biliary dilatation. Pancreas: Unremarkable. No pancreatic ductal dilatation or surrounding inflammatory changes. Spleen: Multiple peripheral wedge-shaped areas of hypoattenuation in the spleen are noted. Adrenals/Urinary Tract: Adrenal glands are unremarkable. Kidneys are normal, without renal calculi, focal lesion, or hydronephrosis. Bladder is unremarkable. Stomach/Bowel: Stomach is within normal limits. Appendix appears normal. No evidence of bowel wall thickening, distention, or inflammatory changes. Enteric contrast reaches the rectum. Vascular/Lymphatic: No significant vascular findings are present. No enlarged abdominal or pelvic lymph nodes. Reproductive: Uterus and bilateral adnexa are unremarkable. Other: There is moderate volume ascites. No abdominal wall hernia is identified. Musculoskeletal: No acute or significant osseous findings. IMPRESSION: 1. Small right pleural effusion with associated atelectasis. 2. Gallbladder wall edema and thickening is nonspecific but can be seen in the setting of acute cholecystitis. 3. 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 4. Moderate volume ascites. Aortic Atherosclerosis (ICD10-I70.0). These results were called by telephone at the time of interpretation on 04/25/2019 at 8:38 pm to provider [facility] NP, who verbally acknowledged these results. Electronically Signed   By: Zerita Boers M.D.   On: 04/25/2019 20:42   US Abdomen Limited  Result Date: 04/26/2019 CLINICAL DATA:  Acute pain. CT demonstrates right pleural effusion, gallbladder wall thickening, abdominal ascites EXAM:  ULTRASOUND ABDOMEN LIMITED RIGHT UPPER QUADRANT COMPARISON:  CT from earlier the same day FINDINGS: Gallbladder: Incompletely distended. Wall thickening up to 1.3 cm. Innumerable subcentimeter layering calculi in the dependent aspect. Small amount of pericholecystic fluid. Sonographer describes no sonographic Murphy's sign. Common bile duct: Not demonstrated Liver: Limited visualization. No discrete lesion or biliary ductal dilatation identified. Other: Small volume perihepatic ascites. 4-quadrant survey shows only scattered small volume abdominal ascites.  No adequate pocket for safe percutaneous paracentesis. IMPRESSION: 1. Cholelithiasis and gallbladder wall thickening, without sonographic Murphy's sign. 2. Small volume abdominal ascites.  Paracentesis deferred. Electronically Signed   By: Lucrezia Europe M.D.   On: 04/26/2019 13:21    Assessment/Plan:  - wil repeat blood cx today - start on amp/sub to cover GI pathogens plus vancomycin - splenic infarcts concerning for endocarditis - please get TEE - consider repeat HIDA scan for GI source  Diarrhea = appears non infectious. Continue with supportive care  More recommendations to follow tomorrow

## 2019-04-27 ENCOUNTER — Inpatient Hospital Stay (HOSPITAL_COMMUNITY): Payer: Self-pay

## 2019-04-27 DIAGNOSIS — D72829 Elevated white blood cell count, unspecified: Secondary | ICD-10-CM

## 2019-04-27 DIAGNOSIS — R1011 Right upper quadrant pain: Secondary | ICD-10-CM

## 2019-04-27 DIAGNOSIS — D735 Infarction of spleen: Secondary | ICD-10-CM

## 2019-04-27 LAB — D-DIMER, QUANTITATIVE: D-Dimer, Quant: 3.7 ug/mL-FEU — ABNORMAL HIGH (ref 0.00–0.50)

## 2019-04-27 LAB — SEDIMENTATION RATE: Sed Rate: 73 mm/hr — ABNORMAL HIGH (ref 0–22)

## 2019-04-27 LAB — CBC WITH DIFFERENTIAL/PLATELET
Abs Immature Granulocytes: 0.98 10*3/uL — ABNORMAL HIGH (ref 0.00–0.07)
Basophils Absolute: 0.2 10*3/uL — ABNORMAL HIGH (ref 0.0–0.1)
Basophils Relative: 1 %
Eosinophils Absolute: 1.1 10*3/uL — ABNORMAL HIGH (ref 0.0–0.5)
Eosinophils Relative: 4 %
HCT: 22.9 % — ABNORMAL LOW (ref 36.0–46.0)
Hemoglobin: 7.9 g/dL — ABNORMAL LOW (ref 12.0–15.0)
Immature Granulocytes: 4 %
Lymphocytes Relative: 14 %
Lymphs Abs: 4 10*3/uL (ref 0.7–4.0)
MCH: 33.9 pg (ref 26.0–34.0)
MCHC: 34.5 g/dL (ref 30.0–36.0)
MCV: 98.3 fL (ref 80.0–100.0)
Monocytes Absolute: 0.8 10*3/uL (ref 0.1–1.0)
Monocytes Relative: 3 %
Neutro Abs: 21.4 10*3/uL — ABNORMAL HIGH (ref 1.7–7.7)
Neutrophils Relative %: 74 %
Platelets: UNDETERMINED 10*3/uL (ref 150–400)
RBC: 2.33 MIL/uL — ABNORMAL LOW (ref 3.87–5.11)
RDW: 16.9 % — ABNORMAL HIGH (ref 11.5–15.5)
WBC: 28.4 10*3/uL — ABNORMAL HIGH (ref 4.0–10.5)
nRBC: 0 % (ref 0.0–0.2)

## 2019-04-27 LAB — COMPREHENSIVE METABOLIC PANEL
ALT: 20 U/L (ref 0–44)
AST: 33 U/L (ref 15–41)
Albumin: 2.6 g/dL — ABNORMAL LOW (ref 3.5–5.0)
Alkaline Phosphatase: 222 U/L — ABNORMAL HIGH (ref 38–126)
Anion gap: 11 (ref 5–15)
BUN: 6 mg/dL — ABNORMAL LOW (ref 8–23)
CO2: 23 mmol/L (ref 22–32)
Calcium: 9.1 mg/dL (ref 8.9–10.3)
Chloride: 105 mmol/L (ref 98–111)
Creatinine, Ser: 1.11 mg/dL — ABNORMAL HIGH (ref 0.44–1.00)
GFR calc Af Amer: >60 mL/min (ref >60)
GFR calc non Af Amer: 53 mL/min — ABNORMAL LOW (ref >60)
Glucose, Bld: 81 mg/dL (ref 70–99)
Potassium: 3.9 mmol/L (ref 3.5–5.1)
Sodium: 139 mmol/L (ref 135–145)
Total Bilirubin: 0.9 mg/dL (ref 0.3–1.2)
Total Protein: 7.1 g/dL (ref 6.5–8.1)

## 2019-04-27 LAB — PATHOLOGIST SMEAR REVIEW: Path Review: 12072020

## 2019-04-27 LAB — PROTIME-INR
INR: 1.3 — ABNORMAL HIGH (ref 0.8–1.2)
Prothrombin Time: 15.7 seconds — ABNORMAL HIGH (ref 11.4–15.2)

## 2019-04-27 LAB — MAGNESIUM: Magnesium: 1.5 mg/dL — ABNORMAL LOW (ref 1.7–2.4)

## 2019-04-27 LAB — LACTATE DEHYDROGENASE: LDH: 180 U/L (ref 98–192)

## 2019-04-27 LAB — C-REACTIVE PROTEIN: CRP: 8 mg/dL — ABNORMAL HIGH (ref <1.0)

## 2019-04-27 LAB — PHOSPHORUS: Phosphorus: 4.3 mg/dL (ref 2.5–4.6)

## 2019-04-27 LAB — MRSA PCR SCREENING: MRSA by PCR: NEGATIVE

## 2019-04-27 MED ORDER — SODIUM CHLORIDE 0.9 % IV SOLN
INTRAVENOUS | Status: DC
  Administered 2019-04-27: 21:00:00 via INTRAVENOUS

## 2019-04-27 MED ORDER — MAGNESIUM SULFATE 50 % IJ SOLN
3.0000 g | Freq: Once | INTRAVENOUS | Status: AC
  Administered 2019-04-27: 3 g via INTRAVENOUS
  Filled 2019-04-27: qty 6

## 2019-04-27 MED ORDER — TECHNETIUM TC 99M MEBROFENIN IV KIT
5.0000 | PACK | Freq: Once | INTRAVENOUS | Status: AC | PRN
  Administered 2019-04-27: 5 via INTRAVENOUS

## 2019-04-27 NOTE — Progress Notes (Signed)
Occupational Therapy Treatment Patient Details Name: Christie Morrison MRN: [id number] DOB: March 12, 1956 Today's Date: 04/27/2019    History of present illness 63yo female presenting with seizure, confusion and disorientation. CT negative for acute CVA and code stroke ultimately cancelled. Admitted for seizure like activity in setting of severe electrolyte derangement. PMH cocaine use, ROH abuse, HLD, seizure   OT comments  Pt progressing toward established goals. Pt currently requires minguard for ADL standing at sink level, she requires modA for simulated tub transfer, and minguard-minA for functional mobility without use of AD. Following toileting and ADL at sink level, pt had 1x LOB requiring assist to stabilize. Continued to educate pt on fall risk and safety concerns with pt, pt stated she would "consider using a RW". Pt will continue to benefit from skilled OT services to maximize safety and independence with ADL/IADL and functional mobility. Will continue to follow acutely and progress as tolerated.     Follow Up Recommendations  Home health OT;Supervision/Assistance - 24 hour    Equipment Recommendations  Tub/shower seat    Recommendations for Other Services      Precautions / Restrictions Precautions Precautions: Fall       Mobility Bed Mobility Overal bed mobility: Independent                Transfers Overall transfer level: Needs assistance Equipment used: None Transfers: Sit to/from Stand Sit to Stand: Min guard         General transfer comment: minguard for sit<>stand    Balance Overall balance assessment: Needs assistance Sitting-balance support: Feet supported Sitting balance-Leahy Scale: Normal     Standing balance support: Single extremity supported;During functional activity Standing balance-Leahy Scale: Fair Standing balance comment: able to static stand without UE support, pt furniture walking at times;1x LOB requirng minA to stabilize                            ADL either performed or assessed with clinical judgement   ADL Overall ADL's : Needs assistance/impaired     Grooming: Min guard;Standing;Oral care;Wash/dry face;Wash/dry Nurse, mental health Details (indicate cue type and reason): minguard for blance in standing                 Toilet Transfer: Min guard;Minimal assistance;Ambulation Toilet Transfer Details (indicate cue type and reason): ambulated to bathroom, minguard for safety, minA to correct balance with return from toileting; Toileting- Water quality scientist and Hygiene: Min guard;Sit to/from stand Toileting - Clothing Manipulation Details (indicate cue type and reason): from regular height toielt Tub/ Shower Transfer: Moderate assistance Tub/Shower Transfer Details (indicate cue type and reason): simulated tub transfer, pt unable to clear simulated edge of tub;modA for stability Functional mobility during ADLs: Min guard;Minimal assistance General ADL Comments: 1x LOB following ADL at sink level and simulated tub transfer;pt appeared to have decreased awareness of deficits;educated pt on benefit on possible use of RW     Vision   Vision Assessment?: No apparent visual deficits   Perception     Praxis      Cognition Arousal/Alertness: Awake/alert Behavior During Therapy: Flat affect Overall Cognitive Status: No family/caregiver present to determine baseline cognitive functioning                                 General Comments: pt with minimal engagement with threrapist throughout session, appears to have decreased safety awareness and decreased awarenss  of deficits;        Exercises     Shoulder Instructions       General Comments continued to discuss safety concerns with pt;    Pertinent Vitals/ Pain       Pain Assessment: No/denies pain  Home Living                                          Prior Functioning/Environment               Frequency  Min 2X/week        Progress Toward Goals  OT Goals(current goals can now be found in the care plan section)  Progress towards OT goals: Progressing toward goals  Acute Rehab OT Goals Patient Stated Goal: to go back to work  OT Goal Formulation: With patient Time For Goal Achievement: 05/02/19 Potential to Achieve Goals: Good ADL Goals Pt Will Perform Grooming: with modified independence;standing Pt Will Perform Upper Body Bathing: with modified independence;sitting Pt Will Perform Lower Body Bathing: with modified independence;sit to/from stand Pt Will Perform Upper Body Dressing: with modified independence;sitting Pt Will Perform Lower Body Dressing: with modified independence;sit to/from stand Pt Will Transfer to Toilet: with modified independence;ambulating;regular height toilet;grab bars Pt Will Perform Toileting - Clothing Manipulation and hygiene: with modified independence;sit to/from stand Pt Will Perform Tub/Shower Transfer: Tub transfer;with min guard assist;ambulating;shower seat  Plan Discharge plan remains appropriate    Co-evaluation                 AM-PAC OT "6 Clicks" Daily Activity     Outcome Measure   Help from another person eating meals?: None Help from another person taking care of personal grooming?: A Little Help from another person toileting, which includes using toliet, bedpan, or urinal?: A Little Help from another person bathing (including washing, rinsing, drying)?: A Little Help from another person to put on and taking off regular upper body clothing?: A Little Help from another person to put on and taking off regular lower body clothing?: A Little 6 Click Score: 19    End of Session Equipment Utilized During Treatment: Gait belt  OT Visit Diagnosis: Unsteadiness on feet (R26.81);Cognitive communication deficit (R41.841)   Activity Tolerance Patient tolerated treatment well   Patient Left in chair;with call  bell/phone within reach;with chair alarm set   Nurse Communication Mobility status        Time: [id number] OT Time Calculation (min): 23 min  Charges: OT General Charges $OT Visit: 1 Visit OT Treatments $Self Care/Home Management : 23-37 mins  [place] Office: [place] 04/27/2019, 11:30 AM

## 2019-04-27 NOTE — Progress Notes (Signed)
[facility] for Infectious Disease  Date of Admission:  04/16/2019     Total days of antibiotics 11 Ceftriaxone 11/27 >> 11/29 Zoysn 11/30 >> 12/4 Vancomycin 12/6 >> Ampicillin >> 12/6 >>         ASSESSMENT:  Christie Morrison continues to have increasing leukocytosis of unclear origin and improving fever curve with concern for cholelithiasis and gall bladder wall thickening. Most recent CT abdomen with splenic infarcts which would be concerning for endocarditis. Initial and repeat blood cultures have been without evidence of bacteremia at present. General surgery planning on repeating HIDA scan given high risk for surgery. Will check MRSA PCR to determine colonization and possibly narrow antibiotic therapy. TEE being scheduled for tomorrow. Renal function stable with no evidence of nephrotoxicity. Continue current dose of Unasyn and vancomycin.   Continues to have diarrhea which is noted to be chronic.One bowel movement charted in the last 24 hours.  Stool pathogen panel remains in process and previous C. Diff testing is negative. This may be related to her current gall bladder disease or possible cirrhosis.   PLAN:  1. Continue current dose of Unasyn and Vancomyin.  2. TEE being scheduled for tomorrow.  3. Check nasal MRSA PCR.  4. Monitor repeat cultures and fever curve. 5. Continue therapeutic drug monitoring of renal function while on vancomycin.   Principal Problem:   Leukocytosis Active Problems:   Cocaine abuse (HCC)   Cirrhosis, alcoholic (HCC)   Hypokalemia   Seizures (HCC)   Hypocalcemia   Hypomagnesemia   Prolonged QT interval   Macrocytic anemia   Hyponatremia   AKI (acute kidney injury) ([facility])   Seizure (HCC)   Klebsiella infection   . enoxaparin (LOVENOX) injection  40 mg Subcutaneous Q24H  . folic acid  1 mg Oral Daily  . magnesium oxide  400 mg Oral Daily  . multivitamin with minerals  1 tablet Oral Daily  . potassium chloride SA  20 mEq Oral Daily  .  thiamine  100 mg Oral Daily   Or  . thiamine  100 mg Intravenous Daily    SUBJECTIVE:  Febrile overnight with max temperature of 100.5 overnight with most recent WBC count of 28.1 on 12/6. Hepatitis Panel negative. No acute events overnight. Limited ultrasound with wall thickening of the gall bladder and innumerable layering calculi showing cholelithiasis without Murphy's sign.   Continuing to have abdominal pain around her abdomen and focus in the right upper quadrant that is worsened when she coughs. Symptoms have been stable with no significant changes. Able to eat okay. Also continuing to have diarrhea with a frequency of 3 over the past 24 hours. Wanting to know when she can go home.   No Known Allergies   Review of Systems: Review of Systems  Constitutional: Negative for chills, fever and weight loss.  Respiratory: Negative for cough, shortness of breath and wheezing.   Cardiovascular: Negative for chest pain and leg swelling.  Gastrointestinal: Positive for abdominal pain and diarrhea. Negative for constipation, nausea and vomiting.  Skin: Negative for rash.      OBJECTIVE: Vitals:   04/26/19 1933 04/27/19 0027 04/27/19 0405 04/27/19 0723  BP: (!) 154/98 (!) 159/99 (!) 149/86 132/85  Pulse: 89 85 89 79  Resp: 16 16 16 16   Temp: 99.5 F (37.5 C) 99 F (37.2 C) (!) 100.5 F (38.1 C) 99.7 F (37.6 C)  TempSrc: Oral Oral Oral Oral  SpO2: 100% 100% 100% 97%  Weight:  Height:       Body mass index is 18.69 kg/m.  Physical Exam Constitutional:      General: She is not in acute distress.    Appearance: She is well-developed.     Comments: Seated in the chair; pleasant.  Cardiovascular:     Rate and Rhythm: Normal rate and regular rhythm.     Heart sounds: Normal heart sounds. No murmur.  Pulmonary:     Effort: Pulmonary effort is normal.     Breath sounds: Normal breath sounds.  Abdominal:     General: Bowel sounds are normal. There is no distension.      Tenderness: There is abdominal tenderness in the right upper quadrant.  Skin:    General: Skin is warm and dry.  Neurological:     Mental Status: She is alert and oriented to person, place, and time.  Psychiatric:        Behavior: Behavior normal.        Thought Content: Thought content normal.        Judgment: Judgment normal.     Lab Results Lab Results  Component Value Date   WBC 28.1 (H) 04/26/2019   HGB 8.0 (L) 04/26/2019   HCT 24.2 (L) 04/26/2019   MCV 101.7 (H) 04/26/2019   PLT 394 04/26/2019    Lab Results  Component Value Date   CREATININE 1.11 (H) 04/26/2019   BUN 8 04/26/2019   NA 138 04/26/2019   K 4.0 04/26/2019   CL 106 04/26/2019   CO2 24 04/26/2019    Lab Results  Component Value Date   ALT 25 04/26/2019   AST 43 (H) 04/26/2019   ALKPHOS 210 (H) 04/26/2019   BILITOT 0.6 04/26/2019     Microbiology: Recent Results (from the past 240 hour(s))  C difficile quick scan w PCR reflex     Status: None   Collection Time: 04/17/19 12:26 PM   Specimen: Rectum; Stool  Result Value Ref Range Status   C Diff antigen NEGATIVE NEGATIVE Final   C Diff toxin NEGATIVE NEGATIVE Final   C Diff interpretation No C. difficile detected.  Final    Comment: Performed at [facility], [place] [address], [place], [place] [address]  Culture, blood (routine x 2)     Status: None (Preliminary result)   Collection Time: 04/23/19  8:37 AM   Specimen: BLOOD RIGHT ARM  Result Value Ref Range Status   Specimen Description BLOOD RIGHT ARM  Final   Special Requests   Final    BOTTLES DRAWN AEROBIC ONLY Blood Culture results may not be optimal due to an inadequate volume of blood received in culture bottles   Culture   Final    NO GROWTH 3 DAYS Performed at [facility], [place] [address] [place], [place] [address]    Report Status PENDING  Incomplete  Culture, blood (routine x 2)     Status: None (Preliminary result)   Collection Time: 04/23/19  8:46 AM   Specimen:  BLOOD RIGHT HAND  Result Value Ref Range Status   Specimen Description BLOOD RIGHT HAND  Final   Special Requests   Final    BOTTLES DRAWN AEROBIC ONLY Blood Culture results may not be optimal due to an inadequate volume of blood received in culture bottles   Culture   Final    NO GROWTH 3 DAYS Performed at [facility], [place] [address], [place], [place] [address]    Report Status  PENDING  Incomplete  Culture, Urine     Status: None   Collection Time: 04/23/19 12:52 PM   Specimen: Urine, Random  Result Value Ref Range Status   Specimen Description URINE, RANDOM  Final   Special Requests NONE  Final   Culture   Final    NO GROWTH Performed at [facility], [address], [place], [place] [address]    Report Status 04/24/2019 FINAL  Final  Culture, Urine     Status: None   Collection Time: 04/23/19  4:33 PM   Specimen: Urine, Random  Result Value Ref Range Status   Specimen Description URINE, RANDOM  Final   Special Requests NONE  Final   Culture   Final    NO GROWTH Performed at [facility], [place] [address] [place], [place] [address]    Report Status 04/24/2019 FINAL  Final  Culture, blood (routine x 2)     Status: None (Preliminary result)   Collection Time: 04/25/19  3:00 PM   Specimen: BLOOD RIGHT HAND  Result Value Ref Range Status   Specimen Description BLOOD RIGHT HAND  Final   Special Requests AEROBIC BOTTLE ONLY Blood Culture adequate volume  Final   Culture   Final    NO GROWTH < 24 HOURS Performed at [facility], [place] [address] [place], [place] [address]    Report Status PENDING  Incomplete  Culture, blood (routine x 2)     Status: None (Preliminary result)   Collection Time: 04/25/19  3:04 PM   Specimen: BLOOD RIGHT WRIST  Result Value Ref Range Status   Specimen Description BLOOD RIGHT WRIST  Final   Special Requests AEROBIC BOTTLE ONLY Blood Culture adequate volume  Final   Culture   Final    NO GROWTH < 24 HOURS Performed at  [facility], [place] [address] [place],  [address]    Report Status PENDING  Incomplete     Terri Piedra, [place] for [place] Pager  04/27/2019  10:35 AM

## 2019-04-27 NOTE — Progress Notes (Signed)
    [facility] has been requested to perform a transesophageal echocardiogram on 12/08 for splenic infarcts concerning for endocarditis.  After careful review of history and examination, the risks and benefits of transesophageal echocardiogram have been explained including risks of esophageal damage, perforation (1:10,000 risk), bleeding, pharyngeal hematoma as well as other potential complications associated with conscious sedation including aspiration, arrhythmia, respiratory failure and death. Alternatives to treatment were discussed, questions were answered. Patient is willing to proceed.   Rosaria Ferries, PA-C 04/27/2019 3:40 PM

## 2019-04-27 NOTE — Progress Notes (Signed)
Patient off unit for procedure.

## 2019-04-27 NOTE — Progress Notes (Signed)
[facility] Progress Note     Subjective: CC-  Patient states that she started having worsening abdominal pain about 2 days ago. She points to her upper abdomen as to where the most severe pain is located. Denies n/v. States that she tolerated a diet yesterday and this did not make her abdominal pain worse. Per chart she did not take in very much. U/s yesterday shows cholelithiasis and gallbladder wall thickening 1.3cm, small volume ascites. Labwork is pending this morning. TMAX 100.5. She is on vancomycin and unasyn.  Objective: Vital signs in last 24 hours: Temp:  [98.7 F (37.1 C)-100.5 F (38.1 C)] 99.7 F (37.6 C) (12/07 0723) Pulse Rate:  [79-91] 79 (12/07 0723) Resp:  [15-17] 16 (12/07 0723) BP: (132-162)/(85-99) 132/85 (12/07 0723) SpO2:  [97 %-100 %] 97 % (12/07 0723) Last BM Date: 04/26/19  Intake/Output from previous day: 12/06 0701 - 12/07 0700 In: 30 [P.O.:30] Out: -  Intake/Output this shift: No intake/output data recorded.  PE: Gen:  Alert, NAD HEENT: EOM's intact, pupils equal and round Card:  RRR Pulm:  CTAB, no W/R/R, rate and effort normal Abd: Soft, ND, +BS, no HSM, no hernia, mild TTP RUQ/epigastric region without rebound or guarding, negative Murphy Skin: warm and dry  Lab Results:  Recent Labs    04/25/19 0303 04/26/19 0340  WBC 25.3* 28.1*  HGB 7.7* 8.0*  HCT 22.4* 24.2*  PLT 388 394   BMET Recent Labs    04/25/19 0303 04/26/19 0340  NA 137 138  K 4.2 4.0  CL 102 106  CO2 25 24  GLUCOSE 83 122*  BUN 10 8  CREATININE 1.26* 1.11*  CALCIUM 8.9 9.3   PT/INR No results for input(s): LABPROT, INR in the last 72 hours. CMP     Component Value Date/Time   NA 138 04/26/2019 0340   NA 143 11/25/2017 0940   K 4.0 04/26/2019 0340   CL 106 04/26/2019 0340   CO2 24 04/26/2019 0340   GLUCOSE 122 (H) 04/26/2019 0340   BUN 8 04/26/2019 0340   BUN 6 (L) 11/25/2017 0940   CREATININE 1.11 (H) 04/26/2019 0340   CALCIUM 9.3  04/26/2019 0340   PROT 6.5 04/26/2019 0340   PROT 7.5 11/25/2017 0940   ALBUMIN 2.8 (L) 04/26/2019 0340   ALBUMIN 4.4 11/25/2017 0940   AST 43 (H) 04/26/2019 0340   ALT 25 04/26/2019 0340   ALT BLOOD 06/08/2016 2034   ALKPHOS 210 (H) 04/26/2019 0340   BILITOT 0.6 04/26/2019 0340   BILITOT 0.3 11/25/2017 0940   GFRNONAA 53 (L) 04/26/2019 0340   GFRAA >60 04/26/2019 0340   Lipase     Component Value Date/Time   LIPASE 22 04/25/2019 0303       Studies/Results: Ct Chest W Contrast  Result Date: 04/25/2019 CLINICAL DATA:  Acute pain, dyspnea. EXAM: CT CHEST, ABDOMEN AND PELVIS WITHOUT CONTRAST TECHNIQUE: Multidetector CT imaging of the chest, abdomen and pelvis was performed following the standard protocol without IV contrast. COMPARISON:  Chest radiograph dated 04/23/2019 FINDINGS: CT CHEST FINDINGS Cardiovascular: Vascular calcifications are seen in the aortic arch. Normal heart size. No pericardial effusion. Mediastinum/Nodes: No enlarged mediastinal, hilar, or axillary lymph nodes. Thyroid gland, trachea, and esophagus demonstrate no significant findings. Lungs/Pleura: There is a small right pleural effusion with associated atelectasis. There is a trace left pleural effusion with associated atelectasis. There is no pneumothorax. Musculoskeletal: No chest wall mass or suspicious bone lesions identified. CT ABDOMEN PELVIS FINDINGS Hepatobiliary: No focal  liver abnormality is seen. There is gallbladder wall edema and thickening, measuring 8 mm. Radiodense material in the gallbladder is favored to reflect vicarious excretion of contrast. No biliary dilatation. Pancreas: Unremarkable. No pancreatic ductal dilatation or surrounding inflammatory changes. Spleen: Multiple peripheral wedge-shaped areas of hypoattenuation in the spleen are noted. Adrenals/Urinary Tract: Adrenal glands are unremarkable. Kidneys are normal, without renal calculi, focal lesion, or hydronephrosis. Bladder is unremarkable.  Stomach/Bowel: Stomach is within normal limits. Appendix appears normal. No evidence of bowel wall thickening, distention, or inflammatory changes. Enteric contrast reaches the rectum. Vascular/Lymphatic: No significant vascular findings are present. No enlarged abdominal or pelvic lymph nodes. Reproductive: Uterus and bilateral adnexa are unremarkable. Other: There is moderate volume ascites. No abdominal wall hernia is identified. Musculoskeletal: No acute or significant osseous findings. IMPRESSION: 1. Small right pleural effusion with associated atelectasis. 2. Gallbladder wall edema and thickening is nonspecific but can be seen in the setting of acute cholecystitis. 3. 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 4. Moderate volume ascites. Aortic Atherosclerosis (ICD10-I70.0). These results were called by telephone at the time of interpretation on 04/25/2019 at 8:38 pm to provider [facility] NP, who verbally acknowledged these results. Electronically Signed   By: Zerita Boers M.D.   On: 04/25/2019 20:42   Ct Abdomen Pelvis W Contrast  Result Date: 04/25/2019 CLINICAL DATA:  Acute pain, dyspnea. EXAM: CT CHEST, ABDOMEN AND PELVIS WITHOUT CONTRAST TECHNIQUE: Multidetector CT imaging of the chest, abdomen and pelvis was performed following the standard protocol without IV contrast. COMPARISON:  Chest radiograph dated 04/23/2019 FINDINGS: CT CHEST FINDINGS Cardiovascular: Vascular calcifications are seen in the aortic arch. Normal heart size. No pericardial effusion. Mediastinum/Nodes: No enlarged mediastinal, hilar, or axillary lymph nodes. Thyroid gland, trachea, and esophagus demonstrate no significant findings. Lungs/Pleura: There is a small right pleural effusion with associated atelectasis. There is a trace left pleural effusion with associated atelectasis. There is no pneumothorax. Musculoskeletal: No chest wall mass or suspicious bone lesions  identified. CT ABDOMEN PELVIS FINDINGS Hepatobiliary: No focal liver abnormality is seen. There is gallbladder wall edema and thickening, measuring 8 mm. Radiodense material in the gallbladder is favored to reflect vicarious excretion of contrast. No biliary dilatation. Pancreas: Unremarkable. No pancreatic ductal dilatation or surrounding inflammatory changes. Spleen: Multiple peripheral wedge-shaped areas of hypoattenuation in the spleen are noted. Adrenals/Urinary Tract: Adrenal glands are unremarkable. Kidneys are normal, without renal calculi, focal lesion, or hydronephrosis. Bladder is unremarkable. Stomach/Bowel: Stomach is within normal limits. Appendix appears normal. No evidence of bowel wall thickening, distention, or inflammatory changes. Enteric contrast reaches the rectum. Vascular/Lymphatic: No significant vascular findings are present. No enlarged abdominal or pelvic lymph nodes. Reproductive: Uterus and bilateral adnexa are unremarkable. Other: There is moderate volume ascites. No abdominal wall hernia is identified. Musculoskeletal: No acute or significant osseous findings. IMPRESSION: 1. Small right pleural effusion with associated atelectasis. 2. Gallbladder wall edema and thickening is nonspecific but can be seen in the setting of acute cholecystitis. 3. 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 4. Moderate volume ascites. Aortic Atherosclerosis (ICD10-I70.0). These results were called by telephone at the time of interpretation on 04/25/2019 at 8:38 pm to provider [facility] NP, who verbally acknowledged these results. Electronically Signed   By: Zerita Boers M.D.   On: 04/25/2019 20:42   US Abdomen Limited  Result Date: 04/26/2019 CLINICAL DATA:  Acute pain. CT demonstrates right pleural effusion, gallbladder wall thickening, abdominal ascites EXAM:  ULTRASOUND ABDOMEN LIMITED RIGHT UPPER QUADRANT COMPARISON:  CT from earlier the  same day FINDINGS: Gallbladder: Incompletely distended. Wall thickening up to 1.3 cm. Innumerable subcentimeter layering calculi in the dependent aspect. Small amount of pericholecystic fluid. Sonographer describes no sonographic Murphy's sign. Common bile duct: Not demonstrated Liver: Limited visualization. No discrete lesion or biliary ductal dilatation identified. Other: Small volume perihepatic ascites. 4-quadrant survey shows only scattered small volume abdominal ascites. No adequate pocket for safe percutaneous paracentesis. IMPRESSION: 1. Cholelithiasis and gallbladder wall thickening, without sonographic Murphy's sign. 2. Small volume abdominal ascites.  Paracentesis deferred. Electronically Signed   By: Lucrezia Europe M.D.   On: 04/26/2019 13:21    Anti-infectives: Anti-infectives (From admission, onward)   Start     Dose/Rate Route Frequency Ordered Stop   04/26/19 2100  Ampicillin-Sulbactam (UNASYN) 3 g in sodium chloride 0.9 % 100 mL IVPB     3 g 200 mL/hr over 30 Minutes Intravenous Every 6 hours 04/26/19 1955     04/26/19 2100  vancomycin (VANCOCIN) 1,250 mg in sodium chloride 0.9 % 250 mL IVPB     1,250 mg 166.7 mL/hr over 90 Minutes Intravenous Every 48 hours 04/26/19 2021     04/24/19 1600  metroNIDAZOLE (FLAGYL) tablet 2,000 mg     2,000 mg Oral  Once 04/24/19 1531 04/24/19 1807   04/20/19 2030  piperacillin-tazobactam (ZOSYN) IVPB 3.375 g  Status:  Discontinued     3.375 g 12.5 mL/hr over 240 Minutes Intravenous Every 8 hours 04/20/19 1339 04/24/19 1313   04/20/19 1345  piperacillin-tazobactam (ZOSYN) IVPB 3.375 g     3.375 g 100 mL/hr over 30 Minutes Intravenous  Once 04/20/19 1339 04/20/19 1537   04/19/19 1500  cephALEXin (KEFLEX) capsule 500 mg  Status:  Discontinued     500 mg Oral Every 8 hours 04/19/19 1447 04/20/19 1335   04/17/19 1000  cefTRIAXone (ROCEPHIN) 1 g in sodium chloride 0.9 % 100 mL IVPB  Status:  Discontinued     1 g 200 mL/hr over 30 Minutes Intravenous  Every 24 hours 04/17/19 0910 04/19/19 1447       Assessment/Plan Suspected seizures -alcohol-related and electrolyte imbalance Alcohol withdrawal - resolved Sepsis secondary to Klebsiella pneumoniae UTI - Completed 3 days of Rocephin and Switched to Keflex 500 mg 3 times daily x5 days on 04/19/19 but had Persistent watery stool reported; repeat Ucx neg Uncontrolled diarrhea -C. difficile negative, GI panel pending Polysubstance use -cocaine/alcohol Hx alcoholic cirrhosis Hx acute hepatitis - hepatitis panel negative Hypertension Hx Mallory-Weiss tear Anemia - labs pending CKD QT prolongation Malnutrition  Fever/leukocytosis uncertain etiology Cholelithiasis ?Acute cholecystitis - HIDA scan 12/1 negative for acute cholecystitis - worsening leukocytosis and fever over the weekend. Started on unasyn and vancomycin 12/6. Lab work is pending today. CT and u/s repeated yesterday and reveal GB wall thickening of 1.3 cm, gallstones, and small volume ascites; negative sonographic Murphy's sign.  Unsure if her leukocytosis and fever are due to acute cholecystitis. Patient is higher risk with surgery given cirrhosis and recent alcohol withdrawal. May consider repeat HIDA for further evaluation, will discuss with MD. Keep NPO. Lab work pending.  ID - currently unasyn and vancomycin 12/6>> FEN - NPO Foley - none Follow up - TBD   LOS: 10 days    Wellington [place], [facility] Surgery 04/27/2019, 9:11 AM Please see Amion for pager number during day hours 7:00am-4:30pm

## 2019-04-27 NOTE — Progress Notes (Signed)
PROGRESS NOTE    Christie Morrison  PQD:[id number] DOB: 07-29-1955 DOA: 04/16/2019 PCP: Gildardo Pounds, NP  Brief Narrative:  HPI per Dr. Myrene Buddy on 04/16/2019 Christie Morrison is a 63 y.o. F with hx of alcoholic cirrhosis, ongoing alcohol use, chronic hypomagnesemia and hypokalemia, and alcohol-related seizure in May of this year who presents with seizure.  Patient is very sleepy historian, but she relates feeling in her normal state of health until this evening.  Per report from ER triage, patient was at home tonight when roommate heard her fall in the bathroom, arrived to find her posturing, incontinent of stool.  EMS arrived and found her confused and disoriented.  In the ER, patient was tachycardic, blood pressure slightly elevated.  Initially called "code stroke" because of aphasia, but this was subsequently canceled as it was thought to be due to postictal state.  Sodium 133, potassium 2.9, magnesium 0.5, calcium 7.6, creatinine 1.6 (up from baseline 0.8), mild transaminitis, leukocytosis, macrocytic anemia.  Anion gap 25, CT head unremarkable.  She was given IV magnesium, IV calcium, IV potassium, and started on Keppra.  Neurology were consulted and the hospital service was asked to admit for severe electrolyte derangements and seizure.  **Interim History Patient admitted to having regular liquor consumption and stated that she drank the day before she presented.  She had an elevated WBC which was trending down but now trending back up and worsening.  I discussed the case with Dr. Irene Limbo of hematology who feels this may be a leukemoid reaction and recommends pan scan the patient if he continues to worsen and checking an echocardiogram as well as extremity duplex to rule out DVT.  She is completed her CIWA protocol Librium multivitamins and folic acid.  On 04/18/2019 she developed persistent diarrhea and C. difficile PCR was negative.  Urine culture showed Klebsiella UTI and  antibiotics for increased to IV Zosyn and will continue for 1 more days she will have 7 days of antibiotic treatment and stop in a.m.  There was concern for acalculous cholecystitis given her abdominal ultrasound but with a negative HIDA scan and general surgery evaluation did not feel that this is the cause of her worsening white count.  Her HIDA scan was normal.  Diet was advanced to 2 g sodium diet given her history of cirrhosis.  She started back on IV fluids yesterday given her slight hypotension because of her worsening renal function is now improving.  Because of her persistent leukocytosis infectious diseases was consulted formally for further evaluation recommendations and antibiotics will be stopped today she is complete her course for urinary tract infection.  She spiked a temperature of 103.3 and so we will pan scan the patient obtain CT of the chest with contrast along with a CT of the abdomen pelvis with contrast.  We will have infectious disease for the way to make additional recommendations given her new fever.  We will also repeat cultures and obtain blood cultures again.  Chest abdomen pelvis CT scan showed a small right pleural effusion associated with atelectasis and gallbladder wall edema and thickening that was nonspecific along with multiple peripheral wedge-shaped areas of hypoattenuation spleen likely representing splenic infarcts and moderate volume ascites on CT scan.  We repeated a right upper quadrant ultrasound and ordered a paracentesis to be done however there is too small of ascites to obtain any fluid.  I have asked General Surgery to reevaluate the gallbladder given her findings and because the patient was  complaining of some right-sided abdominal pain.  Because of her new splenic infarcts there is concern for endocarditis and infectious diseases recommends TEE which will be done in the morning.  Antibiotics were started again and she was placed on Unasyn and IV vancomycin by  infectious diseases and general surgery is repeating a HIDA scan to make sure that she has cholecystitis before they proceed with surgery given her elevated risk of having ascites and liver cirrhosis.  Assessment & Plan:   Principal Problem:   Leukocytosis Active Problems:   Cocaine abuse (HCC)   Cirrhosis, alcoholic (HCC)   Hypokalemia   Seizures (HCC)   Hypocalcemia   Hypomagnesemia   Prolonged QT interval   Macrocytic anemia   Hyponatremia   AKI (acute kidney injury) ([place])   Seizure (HCC)   Klebsiella infection  Alcohol-related seizure suspect provoked by alcohol withdrawal and electrolytes imbalance -Presented with alcohol level less than 10 and significant electrolyte abnormalities -Had a witnessed seizure at home by her son after decrease amount of alcohol intake -Seen by neurology, EEG no sign of seizure activity -No indication for seizure treatment at this time -Completed CIWA protocol.   -No evidence of alcohol withdrawal at this time.   -Continue multivitamin, thiamine and folic acid.   -Continue to replete electrolytes as indicated as phosphorus level is low again today  Sepsis secondary to klebsiella pneumoniae UTI and ? calculus cholecystitis versus other etiology such as an endocarditis; Worsening Leukocytosis and now Febrile and worsening  -Presented with leukocytosis with WBC of 27,000, tachycardia with UA suggestive of UTI obtained on 04/17/2019. -Urine culture taken on 04/17/2019 grew greater than 100,000 colonies of Klebsiella pneumoniae.   -Sensitivities show resistance to nitrofurantoin and ampicillin. -Completed 3 days of Rocephin and Switched to Keflex 500 mg 3 times daily x5 days on 04/19/19 but had Persistent watery stool reported.  C. difficile PCR negative on 04/17/2019.  GI panel by PCR in process still -REPEAT URINE CX No Growth -Fever with T-max 100.5 on 04/20/2019 and had a T-Max of 103.2 the day before yesterday  Abdominal ultrasound suggestive of  acute calculous cholecystitis; Repeated Abdominal U/S yesterday and general surgery recommending repeating a HIDA scan today -PCT was 0.41 and slowly elevated  -Started on IV Zosyn empirically on 04/20/2019 instead given concern for Acute Calculous Cholecystitis and now stopped.  Infectious diseases has resumed antibiotics given concern for endocarditis and have started on IV Unasyn and IV vancomycin -HIDA scan obtained on 04/21/2019 and showed "Normal hepatobiliary imaging with normal gallbladder function." General surgery consulted on 04/21/2019. -Leukocytosis is worsening and now went from 14.4 -> 18.2 -> 20.7 ->24.4 -> 22.9 -> 25.3 -> 28.1 -> 28.4 -IV fluids have now been stopped and Abx Restarted -Continue to monitor fever curve and WBC -Repeat Blood Cx from 04/25/2019 showed NGTD at 2 Days  -Since WBC still worsening will pan scan the patient to evaluate for any hidden abscess such as diverticular abscess or to evaluate for cell Sialadenitis and will evaluate the patient's mouth tomorrow;  Now Febrile and T Max of 103.3 the day before yesterday  -Repeat CT Abdomen and Pelvis showed a small right pleural effusion associated with atelectasis and gallbladder wall edema and thickening that was nonspecific along with multiple peripheral wedge-shaped areas of hypoattenuation spleen likely representing splenic infarcts and moderate volume ascites on CT scan.   -We repeated a right upper quadrant ultrasound and ordered a paracentesis to be done however there is too small of ascites to obtain  any fluid.   -I have asked General Surgery to reevaluate the gallbladder given her findings and because the patient was complaining of some right-sided abdominal pain.  They will repeat a HIDA scan -Rule out other etiologies of elevated WBC and will obtain an echocardiogram to rule out endocarditis and lower extremity Dopplers to rule out DVT -Echocardiogram showed normal EF with no findings concerning for endocarditis  and a extremity venous duplex was negative; however there is still concern for endocarditis given her splenic infarcts and infectious diseases recommends a TEE which will be done tomorrow -Have consulted Infectious Diseases for further evaluation recommendation and they are checking a peripheral smear with chronic leukocytosis and anemia and given the patient metronidazole one-time dose given that she has some trichomonas infection on her UA -Infectious diseases also checking RPR and lipase level as well as checking hepatitis C antibody with a history of cocaine to update the chart for screening; HCV Ab Negative  -Infectious Diseases recommending considering GI consult for her 42-monthhistory of diarrhea or stool changes and possibly EGD screening with her cirrhosis -CRP went from 7.1 -> 6.7 -> 8.8 -> 8.9 -ESR went from 65 -> 99 -> 89 -> 73 -D-Dimer went from 1.53 -> 2.02 -> 3.22 -> 3.70  Leukocytosis -Worsening still and now is 28.4 -See above -Pancultured and obtained an echocardiogram and lower extremity duplex to rule out DVT as was negative and no evidence of endocarditis; Undergoing a TEE -Infectious diseases consulted and they have given the patient 2 g of metronidazole p.o. x1 for trichomonas found in her UA; Now have started her on IV Unasyn and IV Vancomycin  -Continue to monitor and trend and check peripheral smear with chronic leukocytosis and her anemia -Pan-Scan the Patient and get a CT of the Chest/Abdomen/Pelvis with Contrast as above -ID recommending a TEE and general surgery will repeat HIDA scan -Continue to 5 recommendations per infectious disease and continue monitor and follow-up on results of the HIDA scan  ? Acute calculous cholecystitis -Management as stated above -Intermittently has right upper quadrant pain -HIDA scan ordered and normal hepatobiliary imaging with Normal GB Fxn; General Surgery is repeating this today Elevated alkaline phosphatase and was trending  down but now trending back up -T bili is normal; AST slightly elevated and went from 44 -> 49 -> 43 -> 33 and ALT went from 22 -> 24 -> 25 -> 20 -General surgery asked to reevaluate and they feel symptoms could be related to the gallbladder and repeating a HIDA scan -Was NPO but will place on a 2 gram Sodium Diet now that Surgery has no concerns for Acute Cholecystis  -Follow-up on HIDA scan and general surgery recommendations again  High anion gap metabolic acidosis -Bicarb 20 and anion gap of 16 on 04/21/2019. -Now CO2 is 23, anion gap is 11, and chloride is 105 -IV fluids now stopped -Continue to monitor and trend repeat CMP in a.m.  Hypomagnesemia -Presented with magnesium of 0.5 -Magnesium this AM was 1.5 -Replete with IV magnesium 3 g -Continue oral magnesium oxide 400 mg daily -Continue to monitor and replete as necessary -Repeat magnesium level in a.m.  Hypophosphatemia -Patient's phosphorus level this morning was 4.3 -Continue monitor and replete as necessary -Repeat phosphorus level in a.m.  Diarrhea with unclear etiology, present on admission, slowly improving -C. difficile PCR negative -GI panel in process still and will continue enteric precautions for now and discontinue if norovirus is negative -Discontinued Florastor probiotic twice daily and Imodium  2 mg twice daily x 3 days -Resume gentle IV fluid hydration today given her worsening renal function -Of report this is chronic for greater than 3 months and likely will need a GI evaluation at some point -Patient stating that she had normal formed stool and is not as watery and states that is improving  Resolved concern for alcohol withdrawal -Presented with alcohol level less than 10. -Completed CIWA protocol -Continue multivitamins, thiamine and folic acid. -No sign of alcohol withdrawal at the time of this visit. -Has received alcohol cessation counseling at bedside.  Polysubstance abuse including cocaine  and alcohol use disorder -Polysubstance abuse cessation counseled on at bedside -Last cocaine use was days ago -UDS positive for cocaine and benzodiazepine likely this to her antiseizure  AKI likely prerenal secondary to dehydration in the setting of diarrhea -Baseline creatinine 1.1 with GFR greater than 60 -Presented with creatinine 1.4 and GFR 46; Now BUN/Cr is trending upwards and is worse today and was 8/1.48 ; now slightly improved after fluid hydration and BUNs/creatinine is 6/1.11 -Continue to avoid nephrotoxins -Continue to monitor urine output;  -IV fluid hydration is now stopped -Repeat CMP in the a.m.  Ambulatory Dysfunction/Physical Debility -PT OT assessed and recommended home health PT OT -Continue activities with assistance and fall precautions -Case manager working on home health services for DC planning  Resolved Transient Rachycardia -EKG done on 04/17/2019 showed sinus tachycardia rate of 103 -Suspect related to her sepsis -HR now ranging from 79-96  Prolonged QTC -Avoid QTC prolonging agents if possible  -Continue to monitor electrolytes and replete as indicated -Repeat twelve-lead EKG on 04/21/2019 showed a QTc of 445 -Repeat EKG in AM   Macrocytic Anemia -Likely related to the setting of alcoholism -The patient's hemoglobin/hematocrit is stable currently at 7.9/22.9 -Check anemia panel and showed an iron level of 20, U IBC of 159, TIBC 179, saturation ratios of 11%, ferritin level 535, folate level of 19.3, and vitamin B12 of 536 -Continue to monitor for signs and symptoms of bleeding; currently no overt bleeding noted  Liver Cirrhosis likely 2/2 to Alcoholism  -Seen on RUQ U/S as it showed "Mildly nodular hepatic contour, suggesting cirrhosis. No focal hepatic lesion is seen." -T Bili normal but AST is slightly elevated at 43 -Repeat CT scan of the abdomen pelvis showed moderate volume ascites but when ultrasound was done showed small volume ascites and  therapeutic paracentesis could not be done safely -Repeat U/S of the Abdomen showed "Cholelithiasis and gallbladder wall thickening, without sonographic Murphy's sign. Small volume abdominal ascites.  Paracentesis deferred." -HIDA scan has been reordered -Continue to monitor and trend liver functions daily  DVT prophylaxis: Enoxaparin 40 mg sq q24h Code Status: FULL CODE  Family Communication: No family present at bedside  Disposition Plan: Home Health PT when medically stable and Leukocytosis is improving and evaluated for this leukemoid reaction.  General surgery and infectious disease awaiting a.m.  Consultants:   General Surgery   Neurology  Infectious Diseases   Procedures: HIDA  EEG 04/15/1985 IMPRESSION: This study is within normal limits. No seizures or epileptiform discharges were seen throughout the recording.  ECHOCARDIOGRAM IMPRESSIONS    1. Left ventricular ejection fraction, by visual estimation, is 55 to 60%. The left ventricle has normal function. There is moderately increased left ventricular hypertrophy.  2. Left ventricular diastolic parameters are indeterminate.  3. Global right ventricle has normal systolic function.The right ventricular size is normal. No increase in right ventricular wall thickness.  4. Left atrial  size was normal.  5. Right atrial size was normal.  6. The mitral valve is grossly normal. Mild mitral valve regurgitation.  7. The tricuspid valve is normal in structure. Tricuspid valve regurgitation is trivial.  8. The aortic valve is normal in structure. Aortic valve regurgitation is not visualized. No evidence of aortic valve sclerosis or stenosis.  9. The pulmonic valve was normal in structure. Pulmonic valve regurgitation is not visualized. 10. Mildly elevated pulmonary artery systolic pressure. 11. The atrial septum is grossly normal.  FINDINGS  Left Ventricle: Left ventricular ejection fraction, by visual estimation, is 55 to  60%. The left ventricle has normal function. There is moderately increased left ventricular hypertrophy. Left ventricular diastolic parameters are indeterminate.  Right Ventricle: The right ventricular size is normal. No increase in right ventricular wall thickness. Global RV systolic function is has normal systolic function. The tricuspid regurgitant velocity is 2.12 m/s, and with an assumed right atrial pressure  of 15 mmHg, the estimated right ventricular systolic pressure is mildly elevated at 32.9 mmHg.  Left Atrium: Left atrial size was normal in size.  Right Atrium: Right atrial size was normal in size  Pericardium: There is no evidence of pericardial effusion.  Mitral Valve: The mitral valve is grossly normal. There is mild thickening of the mitral valve leaflet(s). MV peak gradient, 9.0 mmHg. Mild mitral valve regurgitation.  Tricuspid Valve: The tricuspid valve is normal in structure. Tricuspid valve regurgitation is trivial.  Aortic Valve: The aortic valve is normal in structure. Aortic valve regurgitation is not visualized. The aortic valve is structurally normal, with no evidence of sclerosis or stenosis. Aortic valve mean gradient measures 5.0 mmHg. Aortic valve peak  gradient measures 8.3 mmHg. Aortic valve area, by VTI measures 1.90 cm.  Pulmonic Valve: The pulmonic valve was normal in structure. Pulmonic valve regurgitation is not visualized.  Aorta: The aortic root and ascending aorta are structurally normal, with no evidence of dilitation.  IAS/Shunts: The atrial septum is grossly normal.    LEFT VENTRICLE PLAX 2D LVIDd:         3.40 cm  Diastology LVIDs:         2.50 cm  LV e' lateral:   8.92 cm/s LV PW:         1.40 cm  LV E/e' lateral: 17.7 LV IVS:        1.40 cm  LV e' medial:    8.05 cm/s LVOT diam:     1.80 cm  LV E/e' medial:  19.6 LV SV:         25 ml LV SV Index:   14.80 LVOT Area:     2.54 cm    RIGHT VENTRICLE             IVC RV Basal  diam:  2.40 cm     IVC diam: 2.50 cm RV S prime:     10.10 cm/s TAPSE (M-mode): 2.2 cm  LEFT ATRIUM             Index       RIGHT ATRIUM          Index LA diam:        3.60 cm 2.07 cm/m  RA Area:     9.86 cm LA Vol (A2C):   54.2 ml 31.15 ml/m RA Volume:   20.30 ml 11.67 ml/m LA Vol (A4C):   38.5 ml 22.13 ml/m LA Biplane Vol: 47.5 ml 27.30 ml/m  AORTIC VALVE AV Area (Vmax):  1.86 cm AV Area (Vmean):   1.79 cm AV Area (VTI):     1.90 cm AV Vmax:           144.00 cm/s AV Vmean:          109.000 cm/s AV VTI:            0.302 m AV Peak Grad:      8.3 mmHg AV Mean Grad:      5.0 mmHg LVOT Vmax:         105.00 cm/s LVOT Vmean:        76.800 cm/s LVOT VTI:          0.225 m LVOT/AV VTI ratio: 0.75   AORTA Ao Root diam: 2.70 cm  MITRAL VALVE                         TRICUSPID VALVE MV Area (PHT): 5.54 cm              TR Peak grad:   17.9 mmHg MV Peak grad:  9.0 mmHg              TR Vmax:        234.00 cm/s MV Mean grad:  3.0 mmHg MV Vmax:       1.50 m/s              SHUNTS MV Vmean:      86.2 cm/s             Systemic VTI:  0.22 m MV VTI:        0.36 m                Systemic Diam: 1.80 cm MV PHT:        39.73 msec MV Decel Time: 137 msec MV E velocity: 158.00 cm/s 103 cm/s MV A velocity: 114.00 cm/s 70.3 cm/s MV E/A ratio:  1.39        1.5   Antimicrobials:  Anti-infectives (From admission, onward)   Start     Dose/Rate Route Frequency Ordered Stop   04/26/19 2100  Ampicillin-Sulbactam (UNASYN) 3 g in sodium chloride 0.9 % 100 mL IVPB     3 g 200 mL/hr over 30 Minutes Intravenous Every 6 hours 04/26/19 1955     04/26/19 2100  vancomycin (VANCOCIN) 1,250 mg in sodium chloride 0.9 % 250 mL IVPB     1,250 mg 166.7 mL/hr over 90 Minutes Intravenous Every 48 hours 04/26/19 2021     04/24/19 1600  metroNIDAZOLE (FLAGYL) tablet 2,000 mg     2,000 mg Oral  Once 04/24/19 1531 04/24/19 1807   04/20/19 2030  piperacillin-tazobactam (ZOSYN) IVPB 3.375 g  Status:   Discontinued     3.375 g 12.5 mL/hr over 240 Minutes Intravenous Every 8 hours 04/20/19 1339 04/24/19 1313   04/20/19 1345  piperacillin-tazobactam (ZOSYN) IVPB 3.375 g     3.375 g 100 mL/hr over 30 Minutes Intravenous  Once 04/20/19 1339 04/20/19 1537   04/19/19 1500  cephALEXin (KEFLEX) capsule 500 mg  Status:  Discontinued     500 mg Oral Every 8 hours 04/19/19 1447 04/20/19 1335   04/17/19 1000  cefTRIAXone (ROCEPHIN) 1 g in sodium chloride 0.9 % 100 mL IVPB  Status:  Discontinued     1 g 200 mL/hr over 30 Minutes Intravenous Every 24 hours 04/17/19 0910 04/19/19 1447     Subjective: Seen and examined at bedside and she is sitting  in the chair still having some abdominal discomfort.  Spiked a intermittent temperature again overnight and was 100.5.  No chest pain, lightheadedness or dizziness but does feel fatigued.  States her diarrhea is improving still.  No other concerns or complaints at this time  Objective: Vitals:   04/27/19 0027 04/27/19 0405 04/27/19 0723 04/27/19 1114  BP: (!) 159/99 (!) 149/86 132/85 137/77  Pulse: 85 89 79 85  Resp: 16 16 16 15   Temp: 99 F (37.2 C) (!) 100.5 F (38.1 C) 99.7 F (37.6 C) 99.1 F (37.3 C)  TempSrc: Oral Oral Oral Oral  SpO2: 100% 100% 97% 100%  Weight:      Height:       No intake or output data in the 24 hours ending 04/27/19 1457 Filed Weights   04/16/19 1527 04/22/19 0500  Weight: 59.3 kg 59.1 kg   Examination: Physical Exam:  Constitutional: Thin African-American female in no acute distress sitting in the chair bedside appears fatigued and mildly uncomfortable Eyes: Lids and conjunctivae normal, sclerae anicteric  ENMT: External Ears, Nose appear normal. Grossly normal hearing.  Neck: Appears normal, supple, no cervical masses, normal ROM, no appreciable thyromegaly; no JVD Respiratory: Mildly diminished to auscultation bilaterally, no wheezing, rales, rhonchi or crackles. Normal respiratory effort and patient is not  tachypenic. No accessory muscle use.  Unlabored breathing Cardiovascular: RRR, no murmurs / rubs / gallops. S1 and S2 auscultated. No extremity edema.  Abdomen: Soft, Tender, non-distended. Bowel sounds positive x4.  GU: Deferred. Musculoskeletal: No clubbing / cyanosis of digits/nails. No joint deformity upper and lower extremities.  Skin: No rashes, lesions, ulcers on limited skin evaluation. No induration; Warm and dry.  Neurologic: CN 2-12 grossly intact with no focal deficits. Romberg sign and cerebellar reflexes not assessed.  Psychiatric: Normal judgment and insight. Alert and oriented x 3. Depressed appearing mood and mildly flat affect.   Data Reviewed: I have personally reviewed following labs and imaging studies  CBC: Recent Labs  Lab 04/23/19 0416 04/24/19 0303 04/25/19 0303 04/26/19 0340 04/27/19 1004  WBC 24.4* 22.9* 25.3* 28.1* 28.4*  NEUTROABS 19.9* 18.5* 19.9* 24.4* 21.4*  HGB 7.9* 7.8* 7.7* 8.0* 7.9*  HCT 23.9* 22.8* 22.4* 24.2* 22.9*  MCV 103.9* 100.9* 100.0 101.7* 98.3  PLT 356 376 388 394 PLATELET CLUMPS NOTED ON SMEAR, UNABLE TO ESTIMATE   Basic Metabolic Panel: Recent Labs  Lab 04/23/19 0416 04/24/19 0303 04/25/19 0303 04/26/19 0340 04/27/19 1004  NA 136 136 137 138 139  K 3.9 4.0 4.2 4.0 3.9  CL 102 100 102 106 105  CO2 25 25 25 24 23   GLUCOSE 129* 101* 83 122* 81  BUN 8 9 10 8  6*  CREATININE 1.48* 1.36* 1.26* 1.11* 1.11*  CALCIUM 9.1 8.7* 8.9 9.3 9.1  MG 1.9 1.5* 1.9 1.6* 1.5*  PHOS <1.0* 2.6 2.4* 1.9* 4.3   GFR: Estimated Creatinine Clearance: 49 mL/min (A) (by C-G formula based on SCr of 1.11 mg/dL (H)). Liver Function Tests: Recent Labs  Lab 04/23/19 0416 04/24/19 0303 04/25/19 0303 04/26/19 0340 04/27/19 1004  AST 30 44* 49* 43* 33  ALT 18 22 24 25 20   ALKPHOS 150* 175* 199* 210* 222*  BILITOT 0.4 0.6 0.7 0.6 0.9  PROT 6.2* 6.3* 6.4* 6.5 7.1  ALBUMIN 2.6* 2.5* 2.7* 2.8* 2.6*   Recent Labs  Lab 04/25/19 0303  LIPASE 22    No results for input(s): AMMONIA in the last 168 hours. Coagulation Profile: Recent Labs  Lab 04/21/19  1341 04/27/19 1004  INR 1.2 1.3*   Cardiac Enzymes: No results for input(s): CKTOTAL, CKMB, CKMBINDEX, TROPONINI in the last 168 hours. BNP (last 3 results) No results for input(s): PROBNP in the last 8760 hours. HbA1C: No results for input(s): HGBA1C in the last 72 hours. CBG: Recent Labs  Lab 04/25/19 1705  GLUCAP 79   Lipid Profile: No results for input(s): CHOL, HDL, LDLCALC, TRIG, CHOLHDL, LDLDIRECT in the last 72 hours. Thyroid Function Tests: No results for input(s): TSH, T4TOTAL, FREET4, T3FREE, THYROIDAB in the last 72 hours. Anemia Panel: No results for input(s): VITAMINB12, FOLATE, FERRITIN, TIBC, IRON, RETICCTPCT in the last 72 hours. Sepsis Labs: Recent Labs  Lab 04/23/19 0416 04/24/19 0303 04/25/19 0303  PROCALCITON 0.54 0.69 0.65    Recent Results (from the past 240 hour(s))  Culture, blood (routine x 2)     Status: None (Preliminary result)   Collection Time: 04/23/19  8:37 AM   Specimen: BLOOD RIGHT ARM  Result Value Ref Range Status   Specimen Description BLOOD RIGHT ARM  Final   Special Requests   Final    BOTTLES DRAWN AEROBIC ONLY Blood Culture results may not be optimal due to an inadequate volume of blood received in culture bottles   Culture   Final    NO GROWTH 3 DAYS Performed at [facility], [place] [address] [place], [place] [address]    Report Status PENDING  Incomplete  Culture, blood (routine x 2)     Status: None (Preliminary result)   Collection Time: 04/23/19  8:46 AM   Specimen: BLOOD RIGHT HAND  Result Value Ref Range Status   Specimen Description BLOOD RIGHT HAND  Final   Special Requests   Final    BOTTLES DRAWN AEROBIC ONLY Blood Culture results may not be optimal due to an inadequate volume of blood received in culture bottles   Culture   Final    NO GROWTH 3 DAYS Performed at [facility], [place]  [address] [place], [place] [address]    Report Status PENDING  Incomplete  Culture, Urine     Status: None   Collection Time: 04/23/19 12:52 PM   Specimen: Urine, Random  Result Value Ref Range Status   Specimen Description URINE, RANDOM  Final   Special Requests NONE  Final   Culture   Final    NO GROWTH Performed at [facility], [place] [address] [place], [place] [address]    Report Status 04/24/2019 FINAL  Final  Culture, Urine     Status: None   Collection Time: 04/23/19  4:33 PM   Specimen: Urine, Random  Result Value Ref Range Status   Specimen Description URINE, RANDOM  Final   Special Requests NONE  Final   Culture   Final    NO GROWTH Performed at [facility], [place] [address] [place], [place] [address]    Report Status 04/24/2019 FINAL  Final  Culture, blood (routine x 2)     Status: None (Preliminary result)   Collection Time: 04/25/19  3:00 PM   Specimen: BLOOD RIGHT HAND  Result Value Ref Range Status   Specimen Description BLOOD RIGHT HAND  Final   Special Requests AEROBIC BOTTLE ONLY Blood Culture adequate volume  Final   Culture   Final    NO GROWTH < 24 HOURS Performed at [facility], [place] [address], [place], [place] [address]    Report Status PENDING  Incomplete  Culture, blood (routine x 2)     Status: None (Preliminary result)   Collection Time: 04/25/19  3:04 PM   Specimen: BLOOD RIGHT WRIST  Result Value Ref Range Status   Specimen Description BLOOD RIGHT WRIST  Final   Special Requests AEROBIC BOTTLE ONLY Blood Culture adequate volume  Final   Culture   Final    NO GROWTH < 24 HOURS Performed at [facility], [place] [address], [place], [place] [address]    Report Status PENDING  Incomplete    Radiology Studies: Ct Chest W Contrast  Result Date: 04/25/2019 CLINICAL DATA:  Acute pain, dyspnea. EXAM: CT CHEST, ABDOMEN AND PELVIS WITHOUT CONTRAST TECHNIQUE: Multidetector CT imaging of the chest, abdomen and pelvis was performed  following the standard protocol without IV contrast. COMPARISON:  Chest radiograph dated 04/23/2019 FINDINGS: CT CHEST FINDINGS Cardiovascular: Vascular calcifications are seen in the aortic arch. Normal heart size. No pericardial effusion. Mediastinum/Nodes: No enlarged mediastinal, hilar, or axillary lymph nodes. Thyroid gland, trachea, and esophagus demonstrate no significant findings. Lungs/Pleura: There is a small right pleural effusion with associated atelectasis. There is a trace left pleural effusion with associated atelectasis. There is no pneumothorax. Musculoskeletal: No chest wall mass or suspicious bone lesions identified. CT ABDOMEN PELVIS FINDINGS Hepatobiliary: No focal liver abnormality is seen. There is gallbladder wall edema and thickening, measuring 8 mm. Radiodense material in the gallbladder is favored to reflect vicarious excretion of contrast. No biliary dilatation. Pancreas: Unremarkable. No pancreatic ductal dilatation or surrounding inflammatory changes. Spleen: Multiple peripheral wedge-shaped areas of hypoattenuation in the spleen are noted. Adrenals/Urinary Tract: Adrenal glands are unremarkable. Kidneys are normal, without renal calculi, focal lesion, or hydronephrosis. Bladder is unremarkable. Stomach/Bowel: Stomach is within normal limits. Appendix appears normal. No evidence of bowel wall thickening, distention, or inflammatory changes. Enteric contrast reaches the rectum. Vascular/Lymphatic: No significant vascular findings are present. No enlarged abdominal or pelvic lymph nodes. Reproductive: Uterus and bilateral adnexa are unremarkable. Other: There is moderate volume ascites. No abdominal wall hernia is identified. Musculoskeletal: No acute or significant osseous findings. IMPRESSION: 1. Small right pleural effusion with associated atelectasis. 2. Gallbladder wall edema and thickening is nonspecific but can be seen in the setting of acute cholecystitis. 3. 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 4. Moderate volume ascites. Aortic Atherosclerosis (ICD10-I70.0). These results were called by telephone at the time of interpretation on 04/25/2019 at 8:38 pm to provider [facility] NP, who verbally acknowledged these results. Electronically Signed   By: Zerita Boers M.D.   On: 04/25/2019 20:42   Ct Abdomen Pelvis W Contrast  Result Date: 04/25/2019 CLINICAL DATA:  Acute pain, dyspnea. EXAM: CT CHEST, ABDOMEN AND PELVIS WITHOUT CONTRAST TECHNIQUE: Multidetector CT imaging of the chest, abdomen and pelvis was performed following the standard protocol without IV contrast. COMPARISON:  Chest radiograph dated 04/23/2019 FINDINGS: CT CHEST FINDINGS Cardiovascular: Vascular calcifications are seen in the aortic arch. Normal heart size. No pericardial effusion. Mediastinum/Nodes: No enlarged mediastinal, hilar, or axillary lymph nodes. Thyroid gland, trachea, and esophagus demonstrate no significant findings. Lungs/Pleura: There is a small right pleural effusion with associated atelectasis. There is a trace left pleural effusion with associated atelectasis. There is no pneumothorax. Musculoskeletal: No chest wall mass or suspicious bone lesions identified. CT ABDOMEN PELVIS FINDINGS Hepatobiliary: No focal liver abnormality is seen. There is gallbladder wall edema and thickening, measuring 8 mm. Radiodense material in the gallbladder is favored to reflect vicarious excretion of  contrast. No biliary dilatation. Pancreas: Unremarkable. No pancreatic ductal dilatation or surrounding inflammatory changes. Spleen: Multiple peripheral wedge-shaped areas of hypoattenuation in the spleen are noted. Adrenals/Urinary Tract: Adrenal glands are unremarkable. Kidneys are normal, without renal calculi, focal lesion, or hydronephrosis. Bladder is unremarkable. Stomach/Bowel: Stomach is within normal limits. Appendix appears normal. No evidence of  bowel wall thickening, distention, or inflammatory changes. Enteric contrast reaches the rectum. Vascular/Lymphatic: No significant vascular findings are present. No enlarged abdominal or pelvic lymph nodes. Reproductive: Uterus and bilateral adnexa are unremarkable. Other: There is moderate volume ascites. No abdominal wall hernia is identified. Musculoskeletal: No acute or significant osseous findings. IMPRESSION: 1. Small right pleural effusion with associated atelectasis. 2. Gallbladder wall edema and thickening is nonspecific but can be seen in the setting of acute cholecystitis. 3. 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 4. Moderate volume ascites. Aortic Atherosclerosis (ICD10-I70.0). These results were called by telephone at the time of interpretation on 04/25/2019 at 8:38 pm to provider [facility] NP, who verbally acknowledged these results. Electronically Signed   By: Zerita Boers M.D.   On: 04/25/2019 20:42   US Abdomen Limited  Result Date: 04/26/2019 CLINICAL DATA:  Acute pain. CT demonstrates right pleural effusion, gallbladder wall thickening, abdominal ascites EXAM: ULTRASOUND ABDOMEN LIMITED RIGHT UPPER QUADRANT COMPARISON:  CT from earlier the same day FINDINGS: Gallbladder: Incompletely distended. Wall thickening up to 1.3 cm. Innumerable subcentimeter layering calculi in the dependent aspect. Small amount of pericholecystic fluid. Sonographer describes no sonographic Murphy's sign. Common bile duct: Not demonstrated Liver: Limited visualization. No discrete lesion or biliary ductal dilatation identified. Other: Small volume perihepatic ascites. 4-quadrant survey shows only scattered small volume abdominal ascites. No adequate pocket for safe percutaneous paracentesis. IMPRESSION: 1. Cholelithiasis and gallbladder wall thickening, without sonographic Murphy's sign. 2. Small volume abdominal ascites.  Paracentesis deferred.  Electronically Signed   By: Lucrezia Europe M.D.   On: 04/26/2019 13:21   Scheduled Meds:  enoxaparin (LOVENOX) injection  40 mg Subcutaneous F41S   folic acid  1 mg Oral Daily   magnesium oxide  400 mg Oral Daily   multivitamin with minerals  1 tablet Oral Daily   potassium chloride SA  20 mEq Oral Daily   thiamine  100 mg Oral Daily   Or   thiamine  100 mg Intravenous Daily   Continuous Infusions:  ampicillin-sulbactam (UNASYN) IV 3 g (04/27/19 1436)   magnesium sulfate bolus IVPB     vancomycin 1,250 mg (04/26/19 2253)    LOS: 10 days   Kerney Elbe, DO [facility] PAGER is on AMION  If 7PM-7AM, please contact night-coverage [email or web address]

## 2019-04-28 ENCOUNTER — Inpatient Hospital Stay (HOSPITAL_COMMUNITY): Payer: Self-pay | Admitting: Anesthesiology

## 2019-04-28 ENCOUNTER — Encounter (HOSPITAL_COMMUNITY): Payer: Self-pay | Admitting: *Deleted

## 2019-04-28 ENCOUNTER — Inpatient Hospital Stay (HOSPITAL_COMMUNITY): Payer: Self-pay

## 2019-04-28 ENCOUNTER — Encounter (HOSPITAL_COMMUNITY)
Admission: EM | Disposition: A | Discharge status: Home Health | Payer: Self-pay | Source: Home / Self Care | Attending: Internal Medicine

## 2019-04-28 DIAGNOSIS — I38 Endocarditis, valve unspecified: Secondary | ICD-10-CM

## 2019-04-28 HISTORY — PX: BUBBLE STUDY: SHX6837

## 2019-04-28 HISTORY — PX: TEE WITHOUT CARDIOVERSION: SHX5443

## 2019-04-28 LAB — CULTURE, BLOOD (ROUTINE X 2)
Culture: NO GROWTH
Culture: NO GROWTH

## 2019-04-28 LAB — COMPREHENSIVE METABOLIC PANEL
ALT: 16 U/L (ref 0–44)
AST: 25 U/L (ref 15–41)
Albumin: 2.4 g/dL — ABNORMAL LOW (ref 3.5–5.0)
Alkaline Phosphatase: 178 U/L — ABNORMAL HIGH (ref 38–126)
Anion gap: 9 (ref 5–15)
BUN: 6 mg/dL — ABNORMAL LOW (ref 8–23)
CO2: 24 mmol/L (ref 22–32)
Calcium: 8.7 mg/dL — ABNORMAL LOW (ref 8.9–10.3)
Chloride: 105 mmol/L (ref 98–111)
Creatinine, Ser: 1.1 mg/dL — ABNORMAL HIGH (ref 0.44–1.00)
GFR calc Af Amer: >60 mL/min (ref >60)
GFR calc non Af Amer: 54 mL/min — ABNORMAL LOW (ref >60)
Glucose, Bld: 79 mg/dL (ref 70–99)
Potassium: 4 mmol/L (ref 3.5–5.1)
Sodium: 138 mmol/L (ref 135–145)
Total Bilirubin: 0.7 mg/dL (ref 0.3–1.2)
Total Protein: 6.5 g/dL (ref 6.5–8.1)

## 2019-04-28 LAB — CBC WITH DIFFERENTIAL/PLATELET
Abs Immature Granulocytes: 0.4 10*3/uL — ABNORMAL HIGH (ref 0.00–0.07)
Basophils Absolute: 0.1 10*3/uL (ref 0.0–0.1)
Basophils Relative: 1 %
Eosinophils Absolute: 1.2 10*3/uL — ABNORMAL HIGH (ref 0.0–0.5)
Eosinophils Relative: 4 %
HCT: 21.6 % — ABNORMAL LOW (ref 36.0–46.0)
Hemoglobin: 7.8 g/dL — ABNORMAL LOW (ref 12.0–15.0)
Immature Granulocytes: 1 %
Lymphocytes Relative: 10 %
Lymphs Abs: 2.8 10*3/uL (ref 0.7–4.0)
MCH: 34.7 pg — ABNORMAL HIGH (ref 26.0–34.0)
MCHC: 36.1 g/dL — ABNORMAL HIGH (ref 30.0–36.0)
MCV: 96 fL (ref 80.0–100.0)
Monocytes Absolute: 1.5 10*3/uL — ABNORMAL HIGH (ref 0.1–1.0)
Monocytes Relative: 6 %
Neutro Abs: 21.8 10*3/uL — ABNORMAL HIGH (ref 1.7–7.7)
Neutrophils Relative %: 78 %
Platelets: 314 10*3/uL (ref 150–400)
RBC: 2.25 MIL/uL — ABNORMAL LOW (ref 3.87–5.11)
RDW: 16.7 % — ABNORMAL HIGH (ref 11.5–15.5)
WBC: 27.8 10*3/uL — ABNORMAL HIGH (ref 4.0–10.5)
nRBC: 0 % (ref 0.0–0.2)

## 2019-04-28 LAB — LACTATE DEHYDROGENASE: LDH: 155 U/L (ref 98–192)

## 2019-04-28 LAB — NOROVIRUS GROUP 1 & 2 BY PCR, STOOL
Norovirus 1 by PCR: NEGATIVE
Norovirus 2  by PCR: NEGATIVE

## 2019-04-28 LAB — MAGNESIUM: Magnesium: 1.9 mg/dL (ref 1.7–2.4)

## 2019-04-28 LAB — D-DIMER, QUANTITATIVE: D-Dimer, Quant: 3.1 ug/mL-FEU — ABNORMAL HIGH (ref 0.00–0.50)

## 2019-04-28 LAB — C-REACTIVE PROTEIN: CRP: 8.4 mg/dL — ABNORMAL HIGH (ref <1.0)

## 2019-04-28 LAB — PHOSPHORUS: Phosphorus: 5.4 mg/dL — ABNORMAL HIGH (ref 2.5–4.6)

## 2019-04-28 LAB — SEDIMENTATION RATE: Sed Rate: 65 mm/hr — ABNORMAL HIGH (ref 0–22)

## 2019-04-28 SURGERY — ECHOCARDIOGRAM, TRANSESOPHAGEAL
Anesthesia: General

## 2019-04-28 MED ORDER — PROPOFOL 10 MG/ML IV BOLUS
INTRAVENOUS | Status: DC | PRN
  Administered 2019-04-28: 20 mg via INTRAVENOUS
  Administered 2019-04-28: 70 mg via INTRAVENOUS
  Administered 2019-04-28: 40 mg via INTRAVENOUS

## 2019-04-28 MED ORDER — SODIUM CHLORIDE 0.9 % IV SOLN
INTRAVENOUS | Status: DC | PRN
  Administered 2019-04-28: 250 mL via INTRAVENOUS

## 2019-04-28 MED ORDER — ONDANSETRON HCL 4 MG/2ML IJ SOLN
INTRAMUSCULAR | Status: DC | PRN
  Administered 2019-04-28: 4 mg via INTRAVENOUS

## 2019-04-28 MED ORDER — PROPOFOL 500 MG/50ML IV EMUL
INTRAVENOUS | Status: DC | PRN
  Administered 2019-04-28: 125 ug/kg/min via INTRAVENOUS

## 2019-04-28 MED ORDER — BUTAMBEN-TETRACAINE-BENZOCAINE 2-2-14 % EX AERO
INHALATION_SPRAY | CUTANEOUS | Status: DC | PRN
  Administered 2019-04-28: 2 via TOPICAL

## 2019-04-28 MED ORDER — PHENYLEPHRINE 40 MCG/ML (10ML) SYRINGE FOR IV PUSH (FOR BLOOD PRESSURE SUPPORT)
PREFILLED_SYRINGE | INTRAVENOUS | Status: DC | PRN
  Administered 2019-04-28 (×2): 200 ug via INTRAVENOUS

## 2019-04-28 MED ORDER — SODIUM CHLORIDE 0.9 % IV SOLN
INTRAVENOUS | Status: DC | PRN
  Administered 2019-04-28: 12:00:00 via INTRAVENOUS

## 2019-04-28 MED ORDER — FENTANYL CITRATE (PF) 100 MCG/2ML IJ SOLN
INTRAMUSCULAR | Status: AC
  Filled 2019-04-28: qty 2

## 2019-04-28 MED ORDER — SUCCINYLCHOLINE CHLORIDE 20 MG/ML IJ SOLN
INTRAMUSCULAR | Status: DC | PRN
  Administered 2019-04-28: 120 mg via INTRAVENOUS

## 2019-04-28 MED ORDER — LIDOCAINE 2% (20 MG/ML) 5 ML SYRINGE
INTRAMUSCULAR | Status: DC | PRN
  Administered 2019-04-28: 40 mg via INTRAVENOUS

## 2019-04-28 NOTE — Anesthesia Procedure Notes (Signed)
Procedure Name: Intubation Date/Time: 04/28/2019 12:59 PM Performed by: Glynda Jaeger, CRNA Pre-anesthesia Checklist: Patient identified, Patient being monitored, Timeout performed, Emergency Drugs available and Suction available Patient Re-evaluated:Patient Re-evaluated prior to induction Oxygen Delivery Method: Circle System Utilized Preoxygenation: Pre-oxygenation with 100% oxygen Induction Type: IV induction Ventilation: Oral airway inserted - appropriate to patient size Laryngoscope Size: Mac and 4 Grade View: Grade II Tube type: Oral Tube size: 7.5 mm Number of attempts: 1 Airway Equipment and Method: Stylet Placement Confirmation: ETT inserted through vocal cords under direct vision,  positive ETCO2 and breath sounds checked- equal and bilateral Secured at: 22 cm Tube secured with: Tape Dental Injury: Teeth and Oropharynx as per pre-operative assessment  Comments: Intubated by Conrad Cascade MD.

## 2019-04-28 NOTE — Transfer of Care (Signed)
Immediate Anesthesia Transfer of Care Note  Patient: Christie Morrison  Procedure(s) Performed: TRANSESOPHAGEAL ECHOCARDIOGRAM (TEE) (N/A ) BUBBLE STUDY  Patient Location: PACU  Anesthesia Type:General  Level of Consciousness: drowsy and patient cooperative  Airway & Oxygen Therapy: Patient Spontanous Breathing  Post-op Assessment: Report given to RN and Post -op Vital signs reviewed and stable  Post vital signs: Reviewed and stable  Last Vitals:  Vitals Value Taken Time  BP    Temp    Pulse    Resp    SpO2      Last Pain:  Vitals:   04/28/19 1118  TempSrc: Oral  PainSc: 6       Patients Stated Pain Goal: 0 ([id number])  Complications: No apparent anesthesia complications

## 2019-04-28 NOTE — Progress Notes (Signed)
PROGRESS NOTE    Christie Morrison  UKG:[id number] DOB: 12-09-1955 DOA: 04/16/2019 PCP: Gildardo Pounds, NP  Brief Narrative:  HPI per Dr. Myrene Buddy on 04/16/2019 Christie Morrison is a 63 y.o. F with hx of alcoholic cirrhosis, ongoing alcohol use, chronic hypomagnesemia and hypokalemia, and alcohol-related seizure in May of this year who presents with seizure.  Patient is very sleepy historian, but she relates feeling in her normal state of health until this evening.  Per report from ER triage, patient was at home tonight when roommate heard her fall in the bathroom, arrived to find her posturing, incontinent of stool.  EMS arrived and found her confused and disoriented.  In the ER, patient was tachycardic, blood pressure slightly elevated.  Initially called "code stroke" because of aphasia, but this was subsequently canceled as it was thought to be due to postictal state.  Sodium 133, potassium 2.9, magnesium 0.5, calcium 7.6, creatinine 1.6 (up from baseline 0.8), mild transaminitis, leukocytosis, macrocytic anemia.  Anion gap 25, CT head unremarkable.  She was given IV magnesium, IV calcium, IV potassium, and started on Keppra.  Neurology were consulted and the hospital service was asked to admit for severe electrolyte derangements and seizure.  **Interim History Patient admitted to having regular liquor consumption and stated that she drank the day before she presented.  She had an elevated WBC which was trending down but now trending back up and worsening.  I discussed the case with Dr. Irene Limbo of hematology who feels this may be a leukemoid reaction and recommends pan scan the patient if he continues to worsen and checking an echocardiogram as well as extremity duplex to rule out DVT.  She is completed her CIWA protocol Librium multivitamins and folic acid.  On 04/18/2019 she developed persistent diarrhea and C. difficile PCR was negative.  Urine culture showed Klebsiella UTI and  antibiotics for increased to IV Zosyn and will continue for 1 more days she will have 7 days of antibiotic treatment and stop in a.m.  There was concern for acalculous cholecystitis given her abdominal ultrasound but with a negative HIDA scan and general surgery evaluation did not feel that this is the cause of her worsening white count.  Her HIDA scan was normal.  Diet was advanced to 2 g sodium diet given her history of cirrhosis.  She started back on IV fluids yesterday given her slight hypotension because of her worsening renal function is now improving.  Because of her persistent leukocytosis infectious diseases was consulted formally for further evaluation recommendations and antibiotics will be stopped today she is complete her course for urinary tract infection.  She spiked a temperature of 103.3 and so we will pan scan the patient obtain CT of the chest with contrast along with a CT of the abdomen pelvis with contrast.  We will have infectious disease for the way to make additional recommendations given her new fever.  We will also repeat cultures and obtain blood cultures again.  Chest abdomen pelvis CT scan showed a small right pleural effusion associated with atelectasis and gallbladder wall edema and thickening that was nonspecific along with multiple peripheral wedge-shaped areas of hypoattenuation spleen likely representing splenic infarcts and moderate volume ascites on CT scan.  We repeated a right upper quadrant ultrasound and ordered a paracentesis to be done however there is too small of ascites to obtain any fluid.  I have asked General Surgery to reevaluate the gallbladder given her findings and because the patient was  complaining of some right-sided abdominal pain.  Because of her new splenic infarcts there is concern for endocarditis and infectious diseases recommends TEE which will be done in the morning.  Antibiotics were started again and she was placed on Unasyn and IV vancomycin by  infectious diseases and general surgery is repeating a HIDA scan to make sure that she has cholecystitis before they proceed with surgery given her elevated risk of having ascites and liver cirrhosis.  Repeat HIDA scan was negative for acute cholecystitis and general surgery has signed off the case again.  Patient underwent TEE today which showed no significant valvular heart disease with a normal EF of 60 to 65% and no left ventricular apical vomiting no PFO no intracardiac source of thrombus or valvular vegetations noted.  Patient's WBC is slightly improved from yesterday as antibiotics have been started.  Assessment & Plan:   Principal Problem:   Leukocytosis Active Problems:   Cocaine abuse (HCC)   Cirrhosis, alcoholic (HCC)   Hypokalemia   Seizures (HCC)   Hypocalcemia   Hypomagnesemia   Prolonged QT interval   Macrocytic anemia   Hyponatremia   AKI (acute kidney injury) ([place])   Seizure (HCC)   Klebsiella infection   Splenic infarct  Alcohol-related seizure suspect provoked by alcohol withdrawal and electrolytes imbalance -Presented with alcohol level less than 10 and significant electrolyte abnormalities -Had a witnessed seizure at home by her son after decrease amount of alcohol intake -Seen by neurology, EEG no sign of seizure activity -No indication for seizure treatment at this time -Completed CIWA protocol.   -No evidence of alcohol withdrawal at this time.   -Continue multivitamin, thiamine and folic acid.   -Continue to replete electrolytes as indicated; potassium was 4.0, magnesium was 1.9, and phosphorus was 5.4  Sepsis secondary to klebsiella pneumoniae UTI and ? calculus cholecystitis versus other etiology such as an endocarditis; Worsened  Leukocytosis and now Febrile -Presented with leukocytosis with WBC of 27,000, tachycardia with UA suggestive of UTI obtained on 04/17/2019. -Urine culture taken on 04/17/2019 grew greater than 100,000 colonies of Klebsiella  pneumoniae.   -Sensitivities show resistance to nitrofurantoin and ampicillin. -Completed 3 days of Rocephin and Switched to Keflex 500 mg 3 times daily x5 days on 04/19/19 but had Persistent watery stool reported.  C. difficile PCR negative on 04/17/2019.  GI panel by PCR in process still -REPEAT URINE CX No Growth now -Fever with T-max 100.5 on 04/20/2019 and had a T-Max of 103.2 on Saturday;  Abdominal ultrasound suggestive of acute calculous cholecystitis; Repeated Abdominal U/S yesterday and general surgery recommending repeating a HIDA scan which was negative for acute cholecystitis again -PCT was 0.41 and slowly elevated  -Started on IV Zosyn empirically on 04/20/2019 instead given concern for Acute Calculous Cholecystitis and now stopped.  Infectious diseases has resumed antibiotics given concern for endocarditis and have started on IV Unasyn and IV vancomycin -HIDA scan obtained on 04/21/2019 and showed "Normal hepatobiliary imaging with normal gallbladder function." General surgery consulted on 04/21/2019. -Leukocytosis is worsening and now went from 14.4 -> 18.2 -> 20.7 ->24.4 -> 22.9 -> 25.3 -> 28.1 -> 28.4 -> 27.8 -IV fluids have now been stopped and Abx Restarted -Continue to monitor fever curve and WBC -Repeat Blood Cx from 04/25/2019 showed NGTD at 3 Days  -Since WBC still worsening will pan scan the patient to evaluate for any hidden abscess such as diverticular abscess or to evaluate for cell Sialadenitis and will evaluate the patient's mouth tomorrow;  Now Febrile and T Max of 103.3 the day before yesterday  -Repeat CT Abdomen and Pelvis showed a small right pleural effusion associated with atelectasis and gallbladder wall edema and thickening that was nonspecific along with multiple peripheral wedge-shaped areas of hypoattenuation spleen likely representing splenic infarcts and moderate volume ascites on CT scan.   -We repeated a right upper quadrant ultrasound and ordered a  paracentesis to be done however there is too small of ascites to obtain any fluid.   -I have asked General Surgery to reevaluate the gallbladder given her findings and because the patient was complaining of some right-sided abdominal pain.  They will repeat a HIDA scan -Rule out other etiologies of elevated WBC and will obtain an echocardiogram to rule out endocarditis and lower extremity Dopplers to rule out DVT -Echocardiogram showed normal EF with no findings concerning for endocarditis and a extremity venous duplex was negative; however there is still concern for endocarditis given her splenic infarcts and infectious diseases recommends a TEE which was done today and showed no evidence of valvular disease or any indication of endocarditis -Have consulted Infectious Diseases for further evaluation recommendation and they are checking a peripheral smear with chronic leukocytosis and anemia and given the patient metronidazole one-time dose given that she has some trichomonas infection on her UA -Infectious diseases also checking RPR and lipase level as well as checking hepatitis C antibody with a history of cocaine to update the chart for screening; HCV Ab Negative  -Infectious Diseases recommending considering GI consult for her 85-monthhistory of diarrhea or stool changes and possibly EGD screening with her cirrhosis -CRP went from 7.1 -> 6.7 -> 8.8 -> 8.9 -> 8.4 -ESR went from 65 -> 99 -> 89 -> 73 -> 65 -D-Dimer went from 1.53 -> 2.02 -> 3.22 -> 3.70 -> 3.10  Leukocytosis -Worsened and peaked at 28.4; currently is now 27.8 -See above -Pancultured and obtained an echocardiogram and lower extremity duplex to rule out DVT as was negative and no evidence of endocarditis; Undergoing a TEE -Infectious diseases consulted and they have given the patient 2 g of metronidazole p.o. x1 for trichomonas found in her UA; Now have started her on IV Unasyn and IV Vancomycin  -Continue to monitor and trend and  check peripheral smear with chronic leukocytosis and her anemia -Pan-Scan the Patient and get a CT of the Chest/Abdomen/Pelvis with Contrast as above -ID recommending a TEE and general surgery will repeat HIDA scan -Continue to 5 recommendations per infectious disease and continue monitor and follow-up on results of the HIDA scan; HIDA scan was negative for acute cholecystitis and general surgery signed off -TEE was negative for endocarditis -Continue further work-up and treatment with antibiotics per recommendations from ID  ? Acute calculous cholecystitis, ruled out again -Management as stated above -Intermittently has right upper quadrant pain -HIDA scan ordered and normal hepatobiliary imaging with Normal GB Fxn; General Surgery is repeating this today Elevated alkaline phosphatase and was trending down but now trending back up -T bili is normal; AST slightly elevated and went from 44 -> 49 -> 43 -> 33 -> 24 and ALT went from 22 -> 24 -> 25 -> 20 -> 16 -General surgery asked to reevaluate and they feel symptoms could be related to the gallbladder and repeating a HIDA scan -Was NPO but will place on a 2 gram Sodium Diet now that Surgery has no concerns for Acute Cholecystis  -Follow-up on HIDA scan and general surgery recommendations again; HIDA  is negative for acute cholecystitis and general surgery signed off again  High anion gap metabolic acidosis -Bicarb 20 and anion gap of 16 on 04/21/2019. -Now CO2 is 24, anion gap is 9, and chloride is 105 -IV fluids now stopped -Continue to monitor and trend repeat CMP in a.m.  Hypomagnesemia -Presented with magnesium of 0.5 -Magnesium this AM was 1.9 -Replete with IV magnesium 3 g yesterday -Continue oral magnesium oxide 400 mg daily -Continue to monitor and replete as necessary -Repeat magnesium level in a.m.  Hypophosphatemia -> Hypernatremia -Patient's phosphorus level this morning was 5.4 -Continue monitor and replete as  necessary -Repeat phosphorus level in a.m.  Diarrhea with unclear etiology, present on admission, slowly improving -C. difficile PCR negative -GI panel in process still and will continue enteric precautions for now and discontinue if norovirus is negative -Discontinued Florastor probiotic twice daily and Imodium 2 mg twice daily x 3 days -Resume gentle IV fluid hydration today given her worsening renal function -Of report this is chronic for greater than 3 months and likely will need a GI evaluation at some point -Patient stating that she had normal formed stool and is not as watery and states that is improving  Resolved concern for alcohol withdrawal -Presented with alcohol level less than 10. -Completed CIWA protocol -Continue multivitamins, thiamine and folic acid. -No sign of alcohol withdrawal at the time of this visit. -Has received alcohol cessation counseling at bedside.  Polysubstance abuse including cocaine and alcohol use disorder -Polysubstance abuse cessation counseled on at bedside -Last cocaine use was days ago -UDS positive for cocaine and benzodiazepine likely this to her antiseizure  AKI likely prerenal secondary to dehydration in the setting of diarrhea -Baseline creatinine 1.1 with GFR greater than 60 -Presented with creatinine 1.4 and GFR 46; Now BUN/Cr is trending upwards and is worse today and was 8/1.48 ; now slightly improved after fluid hydration and BUNs/creatinine is 6/1.10 -Continue to avoid nephrotoxins -Continue to monitor urine output;  -IV fluid hydration is now stopped -Repeat CMP in the a.m.  Ambulatory Dysfunction/Physical Debility -PT OT assessed and recommended home health PT OT -Continue activities with assistance and fall precautions -Case manager working on home health services for DC planning  Resolved Transient Rachycardia -EKG done on 04/17/2019 showed sinus tachycardia rate of 103; now heart rate was 90 this afternoon -Suspect  related to her sepsis -HR now ranging from 79-90  Prolonged QTC -Avoid QTC prolonging agents if possible  -Continue to monitor electrolytes and replete as indicated -Repeat twelve-lead EKG on 04/21/2019 showed a QTc of 445 -Repeat EKG in AM   Macrocytic Anemia -Likely related to the setting of alcoholism -The patient's hemoglobin/hematocrit is stable currently at 7.8/21.6 -Check anemia panel and showed an iron level of 20, U IBC of 159, TIBC 179, saturation ratios of 11%, ferritin level 535, folate level of 19.3, and vitamin B12 of 536 -Continue to monitor for signs and symptoms of bleeding; currently no overt bleeding noted  Liver Cirrhosis likely 2/2 to Alcoholism  -Seen on RUQ U/S as it showed "Mildly nodular hepatic contour, suggesting cirrhosis. No focal hepatic lesion is seen." -T Bili normal but AST i was slightly elevated and trended down -Repeat CT scan of the abdomen pelvis showed moderate volume ascites but when ultrasound was done showed small volume ascites and therapeutic paracentesis could not be done safely -Repeat U/S of the Abdomen showed "Cholelithiasis and gallbladder wall thickening, without sonographic Murphy's sign. Small volume abdominal ascites.  Paracentesis deferred." -HIDA  scan has been reordered and was negative for acute cholecystitis as it showed "No scintigraphic evidence to suggest obstruction of the common bile duct, cystic duct or features of acute cholecystitis."  -Continue to monitor and trend liver functions daily  DVT prophylaxis: Enoxaparin 40 mg sq q24h Code Status: FULL CODE  Family Communication: No family present at bedside  Disposition Plan: Home Health PT when medically stable and Leukocytosis is improving and evaluated for this leukemoid reaction.  She still has no clear evidence of why she has a leukocytosis and ID is following and started her on IV Unasyn and IV vancomycin  Consultants:   General Surgery   Neurology  Infectious  Diseases   Procedures: HIDA  EEG 04/15/1985 IMPRESSION: This study is within normal limits. No seizures or epileptiform discharges were seen throughout the recording.  ECHOCARDIOGRAM IMPRESSIONS    1. Left ventricular ejection fraction, by visual estimation, is 55 to 60%. The left ventricle has normal function. There is moderately increased left ventricular hypertrophy.  2. Left ventricular diastolic parameters are indeterminate.  3. Global right ventricle has normal systolic function.The right ventricular size is normal. No increase in right ventricular wall thickness.  4. Left atrial size was normal.  5. Right atrial size was normal.  6. The mitral valve is grossly normal. Mild mitral valve regurgitation.  7. The tricuspid valve is normal in structure. Tricuspid valve regurgitation is trivial.  8. The aortic valve is normal in structure. Aortic valve regurgitation is not visualized. No evidence of aortic valve sclerosis or stenosis.  9. The pulmonic valve was normal in structure. Pulmonic valve regurgitation is not visualized. 10. Mildly elevated pulmonary artery systolic pressure. 11. The atrial septum is grossly normal.  FINDINGS  Left Ventricle: Left ventricular ejection fraction, by visual estimation, is 55 to 60%. The left ventricle has normal function. There is moderately increased left ventricular hypertrophy. Left ventricular diastolic parameters are indeterminate.  Right Ventricle: The right ventricular size is normal. No increase in right ventricular wall thickness. Global RV systolic function is has normal systolic function. The tricuspid regurgitant velocity is 2.12 m/s, and with an assumed right atrial pressure  of 15 mmHg, the estimated right ventricular systolic pressure is mildly elevated at 32.9 mmHg.  Left Atrium: Left atrial size was normal in size.  Right Atrium: Right atrial size was normal in size  Pericardium: There is no evidence of pericardial  effusion.  Mitral Valve: The mitral valve is grossly normal. There is mild thickening of the mitral valve leaflet(s). MV peak gradient, 9.0 mmHg. Mild mitral valve regurgitation.  Tricuspid Valve: The tricuspid valve is normal in structure. Tricuspid valve regurgitation is trivial.  Aortic Valve: The aortic valve is normal in structure. Aortic valve regurgitation is not visualized. The aortic valve is structurally normal, with no evidence of sclerosis or stenosis. Aortic valve mean gradient measures 5.0 mmHg. Aortic valve peak  gradient measures 8.3 mmHg. Aortic valve area, by VTI measures 1.90 cm.  Pulmonic Valve: The pulmonic valve was normal in structure. Pulmonic valve regurgitation is not visualized.  Aorta: The aortic root and ascending aorta are structurally normal, with no evidence of dilitation.  IAS/Shunts: The atrial septum is grossly normal.    LEFT VENTRICLE PLAX 2D LVIDd:         3.40 cm  Diastology LVIDs:         2.50 cm  LV e' lateral:   8.92 cm/s LV PW:         1.40 cm  LV E/e' lateral: 17.7 LV IVS:        1.40 cm  LV e' medial:    8.05 cm/s LVOT diam:     1.80 cm  LV E/e' medial:  19.6 LV SV:         25 ml LV SV Index:   14.80 LVOT Area:     2.54 cm    RIGHT VENTRICLE             IVC RV Basal diam:  2.40 cm     IVC diam: 2.50 cm RV S prime:     10.10 cm/s TAPSE (M-mode): 2.2 cm  LEFT ATRIUM             Index       RIGHT ATRIUM          Index LA diam:        3.60 cm 2.07 cm/m  RA Area:     9.86 cm LA Vol (A2C):   54.2 ml 31.15 ml/m RA Volume:   20.30 ml 11.67 ml/m LA Vol (A4C):   38.5 ml 22.13 ml/m LA Biplane Vol: 47.5 ml 27.30 ml/m  AORTIC VALVE AV Area (Vmax):    1.86 cm AV Area (Vmean):   1.79 cm AV Area (VTI):     1.90 cm AV Vmax:           144.00 cm/s AV Vmean:          109.000 cm/s AV VTI:            0.302 m AV Peak Grad:      8.3 mmHg AV Mean Grad:      5.0 mmHg LVOT Vmax:         105.00 cm/s LVOT Vmean:        76.800  cm/s LVOT VTI:          0.225 m LVOT/AV VTI ratio: 0.75   AORTA Ao Root diam: 2.70 cm  MITRAL VALVE                         TRICUSPID VALVE MV Area (PHT): 5.54 cm              TR Peak grad:   17.9 mmHg MV Peak grad:  9.0 mmHg              TR Vmax:        234.00 cm/s MV Mean grad:  3.0 mmHg MV Vmax:       1.50 m/s              SHUNTS MV Vmean:      86.2 cm/s             Systemic VTI:  0.22 m MV VTI:        0.36 m                Systemic Diam: 1.80 cm MV PHT:        39.73 msec MV Decel Time: 137 msec MV E velocity: 158.00 cm/s 103 cm/s MV A velocity: 114.00 cm/s 70.3 cm/s MV E/A ratio:  1.39        1.5  TEE No significant valvular heart disease Normal EF 60-65%.  No LV apical thrombus No PFO.   No intracardiac source of thrombus. No valvular vegetations   Antimicrobials:  Anti-infectives (From admission, onward)   Start     Dose/Rate Route Frequency Ordered Stop   04/26/19 2100  Ampicillin-Sulbactam (UNASYN) 3 g in sodium chloride 0.9 % 100 mL IVPB     3 g 200 mL/hr over 30 Minutes Intravenous Every 6 hours 04/26/19 1955     04/26/19 2100  vancomycin (VANCOCIN) 1,250 mg in sodium chloride 0.9 % 250 mL IVPB     1,250 mg 166.7 mL/hr over 90 Minutes Intravenous Every 48 hours 04/26/19 2021     04/24/19 1600  metroNIDAZOLE (FLAGYL) tablet 2,000 mg     2,000 mg Oral  Once 04/24/19 1531 04/24/19 1807   04/20/19 2030  piperacillin-tazobactam (ZOSYN) IVPB 3.375 g  Status:  Discontinued     3.375 g 12.5 mL/hr over 240 Minutes Intravenous Every 8 hours 04/20/19 1339 04/24/19 1313   04/20/19 1345  piperacillin-tazobactam (ZOSYN) IVPB 3.375 g     3.375 g 100 mL/hr over 30 Minutes Intravenous  Once 04/20/19 1339 04/20/19 1537   04/19/19 1500  cephALEXin (KEFLEX) capsule 500 mg  Status:  Discontinued     500 mg Oral Every 8 hours 04/19/19 1447 04/20/19 1335   04/17/19 1000  cefTRIAXone (ROCEPHIN) 1 g in sodium chloride 0.9 % 100 mL IVPB  Status:  Discontinued     1 g 200 mL/hr  over 30 Minutes Intravenous Every 24 hours 04/17/19 0910 04/19/19 1447     Subjective: Seen and examined at bedside this a.m. prior to her TEE and she states that she was hungry.  No chest pain, lightheadedness or dizziness but did have some abdominal cramping.  States her diarrhea is doing okay.  No other concerns or close this time.  Objective: Vitals:   04/28/19 0812 04/28/19 1118 04/28/19 1345 04/28/19 1407  BP: (!) 159/101 (!) 172/95 (!) 142/79   Pulse: 85 85 90   Resp: 18 (!) 28  (!) 24  Temp: 99.2 F (37.3 C) 99.6 F (37.6 C) 99.2 F (37.3 C)   TempSrc: Oral Oral    SpO2: 98% 96% 93% 96%  Weight:  52.2 kg    Height:  5' 10"  (1.778 m)      Intake/Output Summary (Last 24 hours) at 04/28/2019 1635 Last data filed at 04/28/2019 1350 Gross per 24 hour  Intake 700 ml  Output --  Net 700 ml   Filed Weights   04/16/19 1527 04/22/19 0500 04/28/19 1118  Weight: 59.3 kg 59.1 kg 52.2 kg   Examination: Physical Exam:  Constitutional: Thin African-American female currently no acute distress but is slightly agitated given that she is hungry and still not eaten and awaiting her TEE Eyes: Lids and conjunctivae normal, sclerae anicteric  ENMT: External Ears, Nose appear normal. Grossly normal hearing.   Neck: Appears normal, supple, no cervical masses, normal ROM, no appreciable thyromegaly; no JVD Respiratory: Diminished to auscultation bilaterally with mildly coarse, no wheezing, rales, rhonchi or crackles. Normal respiratory effort and patient is not tachypenic. No accessory muscle use.  Unlabored breathing Cardiovascular: RRR, no murmurs / rubs / gallops. S1 and S2 auscultated. No extremity edema.  Abdomen: Soft, mildly-tender, non-distended.  Bowel sounds positive x4.  GU: Deferred. Musculoskeletal: No clubbing / cyanosis of digits/nails. No joint deformity upper and lower extremities.  Skin: No rashes, lesions, ulcers on a limited skin evaluation. No induration; Warm and dry.   Neurologic: CN 2-12 grossly intact with no focal deficits. Romberg sign and cerebellar reflexes not assessed.  Psychiatric: Normal judgment and insight. Alert and oriented x 3. Mildly agitated mood and appropriate affect.   Data Reviewed: I have personally reviewed following labs and imaging  studies  CBC: Recent Labs  Lab 04/24/19 0303 04/25/19 0303 04/26/19 0340 04/27/19 1004 04/28/19 0348  WBC 22.9* 25.3* 28.1* 28.4* 27.8*  NEUTROABS 18.5* 19.9* 24.4* 21.4* 21.8*  HGB 7.8* 7.7* 8.0* 7.9* 7.8*  HCT 22.8* 22.4* 24.2* 22.9* 21.6*  MCV 100.9* 100.0 101.7* 98.3 96.0  PLT 376 388 394 PLATELET CLUMPS NOTED ON SMEAR, UNABLE TO ESTIMATE 263   Basic Metabolic Panel: Recent Labs  Lab 04/24/19 0303 04/25/19 0303 04/26/19 0340 04/27/19 1004 04/28/19 0348  NA 136 137 138 139 138  K 4.0 4.2 4.0 3.9 4.0  CL 100 102 106 105 105  CO2 25 25 24 23 24   GLUCOSE 101* 83 122* 81 79  BUN 9 10 8  6* 6*  CREATININE 1.36* 1.26* 1.11* 1.11* 1.10*  CALCIUM 8.7* 8.9 9.3 9.1 8.7*  MG 1.5* 1.9 1.6* 1.5* 1.9  PHOS 2.6 2.4* 1.9* 4.3 5.4*   GFR: Estimated Creatinine Clearance: 43.7 mL/min (A) (by C-G formula based on SCr of 1.1 mg/dL (H)). Liver Function Tests: Recent Labs  Lab 04/24/19 0303 04/25/19 0303 04/26/19 0340 04/27/19 1004 04/28/19 0348  AST 44* 49* 43* 33 25  ALT 22 24 25 20 16   ALKPHOS 175* 199* 210* 222* 178*  BILITOT 0.6 0.7 0.6 0.9 0.7  PROT 6.3* 6.4* 6.5 7.1 6.5  ALBUMIN 2.5* 2.7* 2.8* 2.6* 2.4*   Recent Labs  Lab 04/25/19 0303  LIPASE 22   No results for input(s): AMMONIA in the last 168 hours. Coagulation Profile: Recent Labs  Lab 04/27/19 1004  INR 1.3*   Cardiac Enzymes: No results for input(s): CKTOTAL, CKMB, CKMBINDEX, TROPONINI in the last 168 hours. BNP (last 3 results) No results for input(s): PROBNP in the last 8760 hours. HbA1C: No results for input(s): HGBA1C in the last 72 hours. CBG: Recent Labs  Lab 04/25/19 1705  GLUCAP 79   Lipid  Profile: No results for input(s): CHOL, HDL, LDLCALC, TRIG, CHOLHDL, LDLDIRECT in the last 72 hours. Thyroid Function Tests: No results for input(s): TSH, T4TOTAL, FREET4, T3FREE, THYROIDAB in the last 72 hours. Anemia Panel: No results for input(s): VITAMINB12, FOLATE, FERRITIN, TIBC, IRON, RETICCTPCT in the last 72 hours. Sepsis Labs: Recent Labs  Lab 04/23/19 0416 04/24/19 0303 04/25/19 0303  PROCALCITON 0.54 0.69 0.65    Recent Results (from the past 240 hour(s))  Culture, blood (routine x 2)     Status: None   Collection Time: 04/23/19  8:37 AM   Specimen: BLOOD RIGHT ARM  Result Value Ref Range Status   Specimen Description BLOOD RIGHT ARM  Final   Special Requests   Final    BOTTLES DRAWN AEROBIC ONLY Blood Culture results may not be optimal due to an inadequate volume of blood received in culture bottles   Culture   Final    NO GROWTH 5 DAYS Performed at [facility], [place] [address] [place], [place] [address]    Report Status 04/28/2019 FINAL  Final  Culture, blood (routine x 2)     Status: None   Collection Time: 04/23/19  8:46 AM   Specimen: BLOOD RIGHT HAND  Result Value Ref Range Status   Specimen Description BLOOD RIGHT HAND  Final   Special Requests   Final    BOTTLES DRAWN AEROBIC ONLY Blood Culture results may not be optimal due to an inadequate volume of blood received in culture bottles   Culture   Final    NO GROWTH 5 DAYS Performed at [facility],  [address], [place], [place] [address]    Report Status 04/28/2019 FINAL  Final  Culture, Urine     Status: None   Collection Time: 04/23/19 12:52 PM   Specimen: Urine, Random  Result Value Ref Range Status   Specimen Description URINE, RANDOM  Final   Special Requests NONE  Final   Culture   Final    NO GROWTH Performed at [facility], [place] [address], [place], [place] [address]    Report Status 04/24/2019 FINAL  Final  Culture, Urine     Status: None   Collection Time:  04/23/19  4:33 PM   Specimen: Urine, Random  Result Value Ref Range Status   Specimen Description URINE, RANDOM  Final   Special Requests NONE  Final   Culture   Final    NO GROWTH Performed at [facility], [place] [address] [place], [place] [address]    Report Status 04/24/2019 FINAL  Final  Culture, blood (routine x 2)     Status: None (Preliminary result)   Collection Time: 04/25/19  3:00 PM   Specimen: BLOOD RIGHT HAND  Result Value Ref Range Status   Specimen Description BLOOD RIGHT HAND  Final   Special Requests AEROBIC BOTTLE ONLY Blood Culture adequate volume  Final   Culture   Final    NO GROWTH 3 DAYS Performed at [facility], [place] [address], [place], [place] [address]    Report Status PENDING  Incomplete  Culture, blood (routine x 2)     Status: None (Preliminary result)   Collection Time: 04/25/19  3:04 PM   Specimen: BLOOD RIGHT WRIST  Result Value Ref Range Status   Specimen Description BLOOD RIGHT WRIST  Final   Special Requests AEROBIC BOTTLE ONLY Blood Culture adequate volume  Final   Culture   Final    NO GROWTH 3 DAYS Performed at [facility], [address], [place], [place] [address]    Report Status PENDING  Incomplete  MRSA PCR Screening     Status: None   Collection Time: 04/27/19  1:37 PM   Specimen: Nasal Mucosa; Nasopharyngeal  Result Value Ref Range Status   MRSA by PCR NEGATIVE NEGATIVE Final    Comment:        The GeneXpert MRSA Assay (FDA approved for NASAL specimens only), is one component of a comprehensive MRSA colonization surveillance program. It is not intended to diagnose MRSA infection nor to guide or monitor treatment for MRSA infections. Performed at [facility], [place] [address], [place], [place] [address]     Radiology Studies: Nm Hepatobiliary Liver Func  Result Date: 04/27/2019 CLINICAL DATA:  Right upper quadrant pain, fever, leukocytosis positive Murphy sign EXAM: NUCLEAR MEDICINE HEPATOBILIARY  IMAGING TECHNIQUE: Sequential images of the abdomen were obtained out to 60 minutes following intravenous administration of radiopharmaceutical. RADIOPHARMACEUTICALS:  5.0 mCi Tc-92m Choletec IV COMPARISON:  Ultrasound 04/26/2019, CT abdomen pelvis 04/25/2019, HIDA with gallbladder ejection fraction 04/21/2019 FINDINGS: Prompt uptake and biliary excretion of activity by the liver is seen. Gallbladder activity is visualized, consistent with patency of cystic duct. Biliary activity passes into small bowel, consistent with patent common bile duct. IMPRESSION: No scintigraphic evidence to suggest obstruction of the common bile duct, cystic duct or features of acute cholecystitis. Electronically Signed   By: PLovena LeM.D.   On: 04/27/2019 17:50   Scheduled Meds:  enoxaparin (LOVENOX) injection  40 mg Subcutaneous Q24H  folic acid  1 mg Oral Daily   magnesium oxide  400 mg Oral Daily   multivitamin with minerals  1 tablet Oral Daily   potassium chloride SA  20 mEq Oral Daily   thiamine  100 mg Oral Daily   Or   thiamine  100 mg Intravenous Daily   Continuous Infusions:  ampicillin-sulbactam (UNASYN) IV 3 g (04/28/19 0355)   vancomycin 1,250 mg (04/26/19 2253)    LOS: 11 days   Kerney Elbe, DO [facility] PAGER is on [place]  If 7PM-7AM, please contact night-coverage [email or web address]

## 2019-04-28 NOTE — Progress Notes (Signed)
PT Cancellation Note  Patient Details Name: Christie Morrison MRN: [id number] DOB: 04-19-1956   Cancelled Treatment:    Reason Eval/Treat Not Completed: (P) Patient at procedure or test/unavailable(off unit for TEE will f/u as patient is available per POC.)   Etola Mull Eli Hose 04/28/2019, 12:40 PM  Erasmo Leventhal , PTA Acute Rehabilitation Services Pager [phone] Office [phone]

## 2019-04-28 NOTE — Progress Notes (Signed)
Repeat HIDA scan negative for acute cholecystitis. Unsure as to the cause of her fever and leukocytosis but her gallbladder does not seem to be the source. General surgery will sign off, please call with concerns.  Christie Morrison, [facility] 04/28/2019, 7:59 AM Please see Amion for pager number during day hours 7:00am-4:30pm

## 2019-04-28 NOTE — Interval H&P Note (Signed)
History and Physical Interval Note:  04/28/2019 11:46 AM  Christie Morrison  has presented today for surgery, with the diagnosis of leukocytosis.  The various methods of treatment have been discussed with the patient and family. After consideration of risks, benefits and other options for treatment, the patient has consented to  Procedure(s): TRANSESOPHAGEAL ECHOCARDIOGRAM (TEE) (N/A) as a surgical intervention.  The patient's history has been reviewed, patient examined, no change in status, stable for surgery.  I have reviewed the patient's chart and labs.  Questions were answered to the patient's satisfaction.     Christie Morrison

## 2019-04-28 NOTE — Progress Notes (Signed)
Ok per Dr Delanna Ahmadi MDA for d/c to floor

## 2019-04-28 NOTE — Anesthesia Preprocedure Evaluation (Addendum)
Anesthesia Evaluation  Patient identified by MRN, date of birth, ID band Patient awake    Reviewed: Allergy & Precautions, NPO status , Patient's Chart, lab work & pertinent test results  History of Anesthesia Complications Negative for: history of anesthetic complications  Airway Mallampati: I  TM Distance: >3 FB Neck ROM: Full    Dental   Pulmonary neg pulmonary ROS,    Pulmonary exam normal        Cardiovascular hypertension, Pt. on medications Normal cardiovascular exam     Neuro/Psych Seizures -,  PSYCHIATRIC DISORDERS    GI/Hepatic (+) Cirrhosis     substance abuse  alcohol use and cocaine use, Hepatitis - Mallory-Weiss Tear    Endo/Other  negative endocrine ROS  Renal/GU Renal InsufficiencyRenal disease     Musculoskeletal negative musculoskeletal ROS (+)   Abdominal   Peds  Hematology  (+) anemia ,  Splenic infarcts    Anesthesia Other Findings FUO, concern for endocarditis Covid negative 11/26   Reproductive/Obstetrics                           Anesthesia Physical Anesthesia Plan  ASA: III  Anesthesia Plan: MAC   Post-op Pain Management:    Induction: Intravenous  PONV Risk Score and Plan: 2 and Propofol infusion and Treatment may vary due to age or medical condition  Airway Management Planned: Nasal Cannula and Natural Airway  Additional Equipment: None  Intra-op Plan:   Post-operative Plan:   Informed Consent: I have reviewed the patients History and Physical, chart, labs and discussed the procedure including the risks, benefits and alternatives for the proposed anesthesia with the patient or authorized representative who has indicated his/her understanding and acceptance.       Plan Discussed with: CRNA and Surgeon  Anesthesia Plan Comments:        Anesthesia Quick Evaluation

## 2019-04-28 NOTE — Progress Notes (Signed)
  Echocardiogram Echocardiogram Transesophageal has been performed.  Matilde Bash 04/28/2019, 1:50 PM

## 2019-04-28 NOTE — CV Procedure (Signed)
INDICATIONS: splenic infarcts  PROCEDURE:   Informed consent was obtained prior to the procedure. The risks, benefits and alternatives for the procedure were discussed and the patient comprehended these risks.  Risks include, but are not limited to, cough, sore throat, vomiting, nausea, somnolence, esophageal and stomach trauma or perforation, bleeding, low blood pressure, aspiration, pneumonia, infection, trauma to the teeth and death.    After a procedural time-out, the oropharynx was anesthetized with 20% benzocaine spray.   During this procedure the patient was intubated for the procedure.  The patient's heart rate, blood pressure, and oxygen saturationweare monitored continuously during the procedure. The period of sedation was 60 minutes, of which I was present face-to-face 100% of this time.  The transesophageal probe was inserted in the esophagus and stomach without difficulty and multiple views were obtained.  The patient was kept under observation until the patient left the procedure room.  The patient left the procedure room in stable condition.   Agitated microbubble saline contrast was administered.  COMPLICATIONS:    There were no immediate complications.  FINDINGS:  No significant valvular heart disease Normal EF 60-65%.  No LV apical thrombus No PFO.   No intracardiac source of thrombus. No valvular vegetations.  RECOMMENDATIONS:    Can return to hospital bed after extubation.   Time Spent Directly with the Patient:  90 minutes   Elouise Munroe 04/28/2019, 1:37 PM

## 2019-04-28 NOTE — Anesthesia Postprocedure Evaluation (Signed)
Anesthesia Post Note  Patient: Christie Morrison  Procedure(s) Performed: TRANSESOPHAGEAL ECHOCARDIOGRAM (TEE) (N/A ) BUBBLE STUDY     Patient location during evaluation: PACU Anesthesia Type: MAC Level of consciousness: awake and alert Pain management: pain level controlled Vital Signs Assessment: post-procedure vital signs reviewed and stable Respiratory status: spontaneous breathing, nonlabored ventilation, respiratory function stable and patient connected to nasal cannula oxygen Cardiovascular status: blood pressure returned to baseline and stable Postop Assessment: no apparent nausea or vomiting Anesthetic complications: no    Last Vitals:  Vitals:   04/28/19 1345 04/28/19 1407  BP: (!) 142/79   Pulse: 90   Resp:  (!) 24  Temp: 37.3 C   SpO2: 93% 96%    Last Pain:  Vitals:   04/28/19 1345  TempSrc:   PainSc: 0-No pain                 Jp Eastham DAVID

## 2019-04-29 ENCOUNTER — Encounter (HOSPITAL_COMMUNITY): Payer: Self-pay | Admitting: Oncology

## 2019-04-29 DIAGNOSIS — A419 Sepsis, unspecified organism: Secondary | ICD-10-CM

## 2019-04-29 DIAGNOSIS — K7031 Alcoholic cirrhosis of liver with ascites: Secondary | ICD-10-CM

## 2019-04-29 DIAGNOSIS — R569 Unspecified convulsions: Secondary | ICD-10-CM

## 2019-04-29 DIAGNOSIS — F10239 Alcohol dependence with withdrawal, unspecified: Secondary | ICD-10-CM

## 2019-04-29 LAB — MAGNESIUM: Magnesium: 1.6 mg/dL — ABNORMAL LOW (ref 1.7–2.4)

## 2019-04-29 LAB — CBC WITH DIFFERENTIAL/PLATELET
Abs Immature Granulocytes: 0.83 10*3/uL — ABNORMAL HIGH (ref 0.00–0.07)
Basophils Absolute: 0.1 10*3/uL (ref 0.0–0.1)
Basophils Relative: 0 %
Eosinophils Absolute: 0 10*3/uL (ref 0.0–0.5)
Eosinophils Relative: 0 %
HCT: 24.2 % — ABNORMAL LOW (ref 36.0–46.0)
Hemoglobin: 8.2 g/dL — ABNORMAL LOW (ref 12.0–15.0)
Immature Granulocytes: 2 %
Lymphocytes Relative: 5 %
Lymphs Abs: 1.6 10*3/uL (ref 0.7–4.0)
MCH: 33.5 pg (ref 26.0–34.0)
MCHC: 33.9 g/dL (ref 30.0–36.0)
MCV: 98.8 fL (ref 80.0–100.0)
Monocytes Absolute: 1 10*3/uL (ref 0.1–1.0)
Monocytes Relative: 3 %
Neutro Abs: 31.5 10*3/uL — ABNORMAL HIGH (ref 1.7–7.7)
Neutrophils Relative %: 90 %
Platelets: 349 10*3/uL (ref 150–400)
RBC: 2.45 MIL/uL — ABNORMAL LOW (ref 3.87–5.11)
RDW: 17 % — ABNORMAL HIGH (ref 11.5–15.5)
WBC: 35.1 10*3/uL — ABNORMAL HIGH (ref 4.0–10.5)
nRBC: 0 % (ref 0.0–0.2)

## 2019-04-29 LAB — COMPREHENSIVE METABOLIC PANEL
ALT: 14 U/L (ref 0–44)
AST: 24 U/L (ref 15–41)
Albumin: 2.3 g/dL — ABNORMAL LOW (ref 3.5–5.0)
Alkaline Phosphatase: 183 U/L — ABNORMAL HIGH (ref 38–126)
Anion gap: 14 (ref 5–15)
BUN: 10 mg/dL (ref 8–23)
CO2: 22 mmol/L (ref 22–32)
Calcium: 8.2 mg/dL — ABNORMAL LOW (ref 8.9–10.3)
Chloride: 105 mmol/L (ref 98–111)
Creatinine, Ser: 1.34 mg/dL — ABNORMAL HIGH (ref 0.44–1.00)
GFR calc Af Amer: 49 mL/min — ABNORMAL LOW (ref >60)
GFR calc non Af Amer: 42 mL/min — ABNORMAL LOW (ref >60)
Glucose, Bld: 188 mg/dL — ABNORMAL HIGH (ref 70–99)
Potassium: 4.1 mmol/L (ref 3.5–5.1)
Sodium: 141 mmol/L (ref 135–145)
Total Bilirubin: 0.7 mg/dL (ref 0.3–1.2)
Total Protein: 6.8 g/dL (ref 6.5–8.1)

## 2019-04-29 LAB — SAVE SMEAR(SSMR), FOR PROVIDER SLIDE REVIEW

## 2019-04-29 LAB — PHOSPHORUS: Phosphorus: 5.8 mg/dL — ABNORMAL HIGH (ref 2.5–4.6)

## 2019-04-29 MED ORDER — DM-GUAIFENESIN ER 30-600 MG PO TB12
1.0000 | ORAL_TABLET | Freq: Two times a day (BID) | ORAL | Status: DC | PRN
  Administered 2019-04-29: 1 via ORAL
  Filled 2019-04-29 (×2): qty 1

## 2019-04-29 MED ORDER — MAGNESIUM SULFATE 2 GM/50ML IV SOLN
2.0000 g | Freq: Once | INTRAVENOUS | Status: AC
  Administered 2019-04-29: 2 g via INTRAVENOUS
  Filled 2019-04-29: qty 50

## 2019-04-29 NOTE — Consult Note (Addendum)
[place]  [phone] [phone]    [place]  Referring MD: Dr. Debbe Odea  Reason for Referral: Leukocytosis and splenic infarcts  HPI: Christie Morrison is a 63 year old female with a past medical history significant for alcoholic cirrhosis with ongoing alcohol use, chronic hypomagnesemia and hypokalemia, and alcohol related seizure.  The patient presented to the emergency room with a seizure.  According to her chart, the patient had a fall at home in the bathroom and was noted to be having a seizure by her roommate.  She was incontinent of stool.  The patient was initially aphasic upon presentation to the ER and a code stroke was initially called, but this was canceled as it was thought to be due to a postictal state.  Work-up in the emergency room showed electrolyte abnormalities including a sodium of 133, potassium 2.9, magnesium 0.5, calcium 7.6, creatinine 1.6 which was elevated from her baseline of 0.8, mild transaminitis, white blood cell count 27.9, hemoglobin 9.3, MCV 101.1, and a normal platelet count of 165,000.  A vitamin B12 level was checked and was normal at 309.  Folate RBC was normal at 854.  Urine drug screen was positive for cocaine and benzodiazepines.  Ethanol level was less than 10.  During her hospitalization, she started to develop abdominal pain and was having low-grade fevers between 100-100.6.  General surgery saw the patient for possible cholecystitis.  HIDA scan showed normal hepatobiliary imaging with normal gallbladder function.  She spiked a temperature of 103.3 and had ongoing abdominal pain.  A CT of the chest, abdomen, pelvis was obtained on 04/25/2019.  Results showed a small right pleural effusion with associated atelectasis, gallbladder wall edema and thickening is nonspecific but can be seen in the setting of acute cholecystitis, multiple peripheral wedge-shaped areas of hypoattenuation in the spleen likely  represent splenic infarcts, moderate volume ascites.  During this hospitalization, her's white blood cell count has primarily been in the 20,000 range but is up to 35,100 today.  Her most recent CBC available to me prior to the hospitalization was performed on 11/20/2018 and at that time her total white blood cell count was 16.5.  She was in the emergency room on this date for syncope and appears that she may have had an early UTI based on her urinalysis results.  I also see that on 10/16/2018, her white blood cell count was 23.8.  This was from during hospitalization for alcohol-related seizures and leukocytosis was thought to be reactive.  She had a normal white blood cell count of 9.8 on 06/13/2018.  A TEE was performed on 04/28/2019 and was negative for cardiac thrombus and endocarditis.  When seen today, the patient reports that her abdominal pain has resolved.  No fevers noted over the past 24 hours.  The patient states that prior to admission she had anorexia.  She is not sure if she has had any weight loss.  Denies night sweats.  Denies fevers but states that she is always "cold" but cannot really state whether or not she is having chills.  She is reporting some chest discomfort and congestion today.  She also reports being short of breath.  She denies cough and hemoptysis.  Denies abdominal pain, nausea, vomiting, constipation, diarrhea.  She has not noticed any lymphadenopathy, bleeding, or petechiae.  The patient reports that she is divorced and lives with her son and a roommate.  She works part-time at a [employer].  Denies tobacco use.  Reports that she binge drinks alcohol intermittently.  Denies other drug use including cocaine and is unsure why her urine drug screen was positive for cocaine on admission.  Reports that she thinks her mother had a blood clot -unclear of DVT or PE.  Denies other blood disorders such as sickle cell disease.  Denies family history of cancer.  Hematology was asked see the  patient to make recommendations regarding her leukocytosis and splenic infarcts.  Past Medical History:  Diagnosis Date   Acute hepatitis    Cirrhosis ([place])    Crack cocaine use    Duodenitis    ETOH abuse    High cholesterol    Hypertension    Mallory-Weiss tear    Seizures ([place])   :    Past Surgical History:  Procedure Laterality Date   BUBBLE STUDY  04/28/2019   Procedure: BUBBLE STUDY;  Surgeon: Elouise Munroe, MD;  Location: [facility] ENDOSCOPY;  Service: Cardiology;;   CESAREAN SECTION     TEE WITHOUT CARDIOVERSION N/A 04/28/2019   Procedure: TRANSESOPHAGEAL ECHOCARDIOGRAM (TEE);  Surgeon: Elouise Munroe, MD;  Location: [place];  Service: Cardiology;  Laterality: N/A;  :   CURRENT MEDS: Current Facility-Administered Medications  Medication Dose Route Frequency Provider Last Rate Last Dose   0.9 %  sodium chloride infusion   Intravenous PRN Raiford Noble Mon[place], DO   Stopped at 04/29/19 2637   acetaminophen (TYLENOL) tablet 650 mg  650 mg Oral Q6H PRN Edwin Dada, MD   650 mg at 04/28/19 1702   Or   acetaminophen (TYLENOL) suppository 650 mg  650 mg Rectal Q6H PRN Danford, Suann Larry, MD       Ampicillin-Sulbactam (UNASYN) 3 g in sodium chloride 0.9 % 100 mL IVPB  3 g Intravenous Q6H Carlyle Basques, MD 200 mL/hr at 04/29/19 0911 3 g at 04/29/19 0911   enoxaparin (LOVENOX) injection 40 mg  40 mg Subcutaneous Q24H Edwin Dada, MD   40 mg at 85/88/50 2774   folic acid (FOLVITE) tablet 1 mg  1 mg Oral Daily Edwin Dada, MD   1 mg at 04/29/19 1287   loperamide (IMODIUM) capsule 2 mg  2 mg Oral PRN Irene Pap N, DO   2 mg at 04/26/19 1823   LORazepam (ATIVAN) injection 0.5 mg  0.5 mg Intravenous Q6H PRN Edwin Dada, MD   0.5 mg at 04/26/19 2253   magnesium oxide (MAG-OX) tablet 400 mg  400 mg Oral Daily Edwin Dada, MD   400 mg at 04/29/19 0905   metoprolol tartrate (LOPRESSOR) injection 5 mg   5 mg Intravenous Q5 min PRN Lovey Newcomer T, NP       multivitamin with minerals tablet 1 tablet  1 tablet Oral Daily Danford, Suann Larry, MD   1 tablet at 04/29/19 0905   potassium chloride SA (KLOR-CON) CR tablet 20 mEq  20 mEq Oral Daily Edwin Dada, MD   20 mEq at 04/29/19 8676   thiamine (VITAMIN B-1) tablet 100 mg  100 mg Oral Daily Edwin Dada, MD   100 mg at 04/29/19 7209   Or   thiamine (B-1) injection 100 mg  100 mg Intravenous Daily Edwin Dada, MD   100 mg at 04/26/19 0931   vancomycin (VANCOCIN) 1,250 mg in sodium chloride 0.9 % 250 mL IVPB  1,250 mg Intravenous Q48H Kris Mouton, [facility]   Stopped at 04/28/19 2341      No Known Allergies:  Family History  Problem Relation Age of Onset   Diabetes Mellitus II Mother    Kidney failure Mother    Alcohol abuse Mother    Diabetes Mellitus II Brother    Kidney failure Brother    Alcohol abuse Maternal Grandmother    Hypertension Other   :  Social History   Socioeconomic History   Marital status: Single    Spouse name: Not on file   Number of children: Not on file   Years of education: Not on file   Highest education level: Not on file  Occupational History   Not on file  Social Needs   Financial resource strain: Not on file   Food insecurity    Worry: Not on file    Inability: Not on file   Transportation needs    Medical: Not on file    Non-medical: Not on file  Tobacco Use   Smoking status: Never Smoker   Smokeless tobacco: Never Used  Substance and Sexual Activity   Alcohol use: Yes    Comment: occasionally    Drug use: Not Currently    Frequency: 1.0 times per week    Types: Cocaine   Sexual activity: Never  Lifestyle   Physical activity    Days per week: Not on file    Minutes per session: Not on file   Stress: Not on file  Relationships   Social connections    Talks on phone: Not on file    Gets together: Not on file    Attends  religious service: Not on file    Active member of club or organization: Not on file    Attends meetings of clubs or organizations: Not on file    Relationship status: Not on file   Intimate partner violence    Fear of current or ex partner: Not on file    Emotionally abused: Not on file    Physically abused: Not on file    Forced sexual activity: Not on file  Other Topics Concern   Not on file  Social History Narrative   Pt lives in 1 story home with her roommate and son   Has 1 child   Has bachelors degree   Currently work at the [employer]  :  REVIEW OF SYSTEMS: A comprehensive 14 point review of systems is negative except as noted in the HPI.  Exam: Patient Vitals for the past 24 hrs:  BP Temp Temp src Pulse Resp SpO2 Height Weight  04/29/19 0851 (!) 160/98 98 F (36.7 C) Oral 77 18 100 % -- --  04/29/19 0406 -- -- -- -- -- -- -- 114 lb 10.2 oz (52 kg)  04/29/19 0353 (!) 165/95 97.8 F (36.6 C) Oral 74 17 98 % -- --  04/28/19 2348 125/75 97.8 F (36.6 C) Oral 73 17 96 % -- --  04/28/19 2046 126/82 (!) 97.4 F (36.3 C) Oral 95 18 100 % -- --  04/28/19 1407 -- -- -- -- (!) 24 96 % -- --  04/28/19 1345 (!) 142/79 99.2 F (37.3 C) -- 90 -- 93 % -- --  04/28/19 1118 (!) 172/95 99.6 F (37.6 C) Oral 85 (!) 28 96 % 5' 10"  (1.778 m) 115 lb (52.2 kg)    General: Thin female who is in no acute distress.    Eyes:  no scleral icterus.   ENT:  There were no oropharyngeal lesions.   Neck was without thyromegaly.   Lymphatics:  Negative  cervical, supraclavicular or axillary adenopathy.   Respiratory: Diminished breath sounds bilaterally. Cardiovascular:  Regular rate and rhythm, S1/S2, without murmur, rub or gallop.  There was no pedal edema.   GI:  abdomen was soft, flat, nontender, nondistended, without organomegaly.   Musculoskeletal: Moves all extremities x4. Skin exam was without ecchymosis, petechiae.   Neuro exam was nonfocal. Patient was alert and oriented.  Attention  was good.   Language was appropriate.  Mood was normal without depression.  Speech was not pressured.  Thought content was not tangential.    LABS:  Lab Results  Component Value Date   WBC 35.1 (H) 04/29/2019   HGB 8.2 (L) 04/29/2019   HCT 24.2 (L) 04/29/2019   PLT 349 04/29/2019   GLUCOSE 188 (H) 04/29/2019   CHOL 249 (H) 11/25/2017   TRIG 110 11/25/2017   HDL 120 11/25/2017   LDLCALC 107 (H) 11/25/2017   ALT 14 04/29/2019   AST 24 04/29/2019   NA 141 04/29/2019   K 4.1 04/29/2019   CL 105 04/29/2019   CREATININE 1.34 (H) 04/29/2019   BUN 10 04/29/2019   CO2 22 04/29/2019   INR 1.3 (H) 04/27/2019    Ct Chest W Contrast  Result Date: 04/25/2019 CLINICAL DATA:  Acute pain, dyspnea. EXAM: CT CHEST, ABDOMEN AND PELVIS WITHOUT CONTRAST TECHNIQUE: Multidetector CT imaging of the chest, abdomen and pelvis was performed following the standard protocol without IV contrast. COMPARISON:  Chest radiograph dated 04/23/2019 FINDINGS: CT CHEST FINDINGS Cardiovascular: Vascular calcifications are seen in the aortic arch. Normal heart size. No pericardial effusion. Mediastinum/Nodes: No enlarged mediastinal, hilar, or axillary lymph nodes. Thyroid gland, trachea, and esophagus demonstrate no significant findings. Lungs/Pleura: There is a small right pleural effusion with associated atelectasis. There is a trace left pleural effusion with associated atelectasis. There is no pneumothorax. Musculoskeletal: No chest wall mass or suspicious bone lesions identified. CT ABDOMEN PELVIS FINDINGS Hepatobiliary: No focal liver abnormality is seen. There is gallbladder wall edema and thickening, measuring 8 mm. Radiodense material in the gallbladder is favored to reflect vicarious excretion of contrast. No biliary dilatation. Pancreas: Unremarkable. No pancreatic ductal dilatation or surrounding inflammatory changes. Spleen: Multiple peripheral wedge-shaped areas of hypoattenuation in the spleen are noted.  Adrenals/Urinary Tract: Adrenal glands are unremarkable. Kidneys are normal, without renal calculi, focal lesion, or hydronephrosis. Bladder is unremarkable. Stomach/Bowel: Stomach is within normal limits. Appendix appears normal. No evidence of bowel wall thickening, distention, or inflammatory changes. Enteric contrast reaches the rectum. Vascular/Lymphatic: No significant vascular findings are present. No enlarged abdominal or pelvic lymph nodes. Reproductive: Uterus and bilateral adnexa are unremarkable. Other: There is moderate volume ascites. No abdominal wall hernia is identified. Musculoskeletal: No acute or significant osseous findings. IMPRESSION: 1. Small right pleural effusion with associated atelectasis. 2. Gallbladder wall edema and thickening is nonspecific but can be seen in the setting of acute cholecystitis. 3. 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 4. Moderate volume ascites. Aortic Atherosclerosis (ICD10-I70.0). These results were called by telephone at the time of interpretation on 04/25/2019 at 8:38 pm to provider [facility] NP, who verbally acknowledged these results. Electronically Signed   By: Zerita Boers M.D.   On: 04/25/2019 20:42   Nm Hepatobiliary Liver Func  Result Date: 04/27/2019 CLINICAL DATA:  Right upper quadrant pain, fever, leukocytosis positive Murphy sign EXAM: NUCLEAR MEDICINE HEPATOBILIARY IMAGING TECHNIQUE: Sequential images of the abdomen were obtained out to 60 minutes following intravenous administration of radiopharmaceutical. RADIOPHARMACEUTICALS:  5.0 mCi Tc-79m Choletec IV COMPARISON:  Ultrasound 04/26/2019, CT abdomen pelvis 04/25/2019, HIDA with gallbladder ejection fraction 04/21/2019 FINDINGS: Prompt uptake and biliary excretion of activity by the liver is seen. Gallbladder activity is visualized, consistent with patency of cystic duct. Biliary activity passes into small bowel, consistent with  patent common bile duct. IMPRESSION: No scintigraphic evidence to suggest obstruction of the common bile duct, cystic duct or features of acute cholecystitis. Electronically Signed   By: PLovena LeM.D.   On: 04/27/2019 17:50   [place]Abdomen Complete  Result Date: 04/20/2019 CLINICAL DATA:  Cirrhosis EXAM: ABDOMEN ULTRASOUND COMPLETE COMPARISON:  None. FINDINGS: Gallbladder: Multiple layering gallstones, including a 12 mm stone in the gallbladder neck. Associated gallbladder wall thickening/pericholecystic fluid. Common bile duct: Diameter: 4 mm Liver: No focal lesion identified. Within normal limits in parenchymal echogenicity. Portal vein is patent on color Doppler imaging with normal direction of blood flow towards the liver. IVC: No abnormality visualized. Pancreas: Visualized portion unremarkable. Spleen: Size and appearance within normal limits. Right Kidney: Length: 12.8 cm. Echogenicity within normal limits. No mass or hydronephrosis visualized. Left Kidney: Length: 11.5 cm. Echogenicity within normal limits. No mass or hydronephrosis visualized. Abdominal aorta: No aneurysm visualized. Other findings: None. IMPRESSION: Cholelithiasis with gallbladder wall thickening/pericholecystic fluid, concerning for acute cholecystitis. Correlate for right upper quadrant pain. If clinically warranted, consider hepatobiliary nuclear medicine scan for confirmation. Mildly nodular hepatic contour, suggesting cirrhosis. No focal hepatic lesion is seen. These results will be called to the ordering clinician or representative by the Radiologist Assistant, and communication documented in the PACS or zVision Dashboard. Electronically Signed   By: SJulian HyM.D.   On: 04/20/2019 20:59   Ct Abdomen Pelvis W Contrast  Result Date: 04/25/2019 CLINICAL DATA:  Acute pain, dyspnea. EXAM: CT CHEST, ABDOMEN AND PELVIS WITHOUT CONTRAST TECHNIQUE: Multidetector CT imaging of the chest, abdomen and pelvis was performed  following the standard protocol without IV contrast. COMPARISON:  Chest radiograph dated 04/23/2019 FINDINGS: CT CHEST FINDINGS Cardiovascular: Vascular calcifications are seen in the aortic arch. Normal heart size. No pericardial effusion. Mediastinum/Nodes: No enlarged mediastinal, hilar, or axillary lymph nodes. Thyroid gland, trachea, and esophagus demonstrate no significant findings. Lungs/Pleura: There is a small right pleural effusion with associated atelectasis. There is a trace left pleural effusion with associated atelectasis. There is no pneumothorax. Musculoskeletal: No chest wall mass or suspicious bone lesions identified. CT ABDOMEN PELVIS FINDINGS Hepatobiliary: No focal liver abnormality is seen. There is gallbladder wall edema and thickening, measuring 8 mm. Radiodense material in the gallbladder is favored to reflect vicarious excretion of contrast. No biliary dilatation. Pancreas: Unremarkable. No pancreatic ductal dilatation or surrounding inflammatory changes. Spleen: Multiple peripheral wedge-shaped areas of hypoattenuation in the spleen are noted. Adrenals/Urinary Tract: Adrenal glands are unremarkable. Kidneys are normal, without renal calculi, focal lesion, or hydronephrosis. Bladder is unremarkable. Stomach/Bowel: Stomach is within normal limits. Appendix appears normal. No evidence of bowel wall thickening, distention, or inflammatory changes. Enteric contrast reaches the rectum. Vascular/Lymphatic: No significant vascular findings are present. No enlarged abdominal or pelvic lymph nodes. Reproductive: Uterus and bilateral adnexa are unremarkable. Other: There is moderate volume ascites. No abdominal wall hernia is identified. Musculoskeletal: No acute or significant osseous findings. IMPRESSION: 1. Small right pleural effusion with associated atelectasis. 2. Gallbladder wall edema and thickening is nonspecific but can be seen in the setting of acute cholecystitis. 3. 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 4. Moderate volume  ascites. Aortic Atherosclerosis (ICD10-I70.0). These results were called by telephone at the time of interpretation on 04/25/2019 at 8:38 pm to provider [facility] NP, who verbally acknowledged these results. Electronically Signed   By: Zerita Boers M.D.   On: 04/25/2019 20:42   Nm Hepato W/eject Fract  Result Date: 04/21/2019 CLINICAL DATA:  Known cholelithiasis.  History of cirrhosis. EXAM: NUCLEAR MEDICINE HEPATOBILIARY IMAGING WITH GALLBLADDER EF TECHNIQUE: Sequential images of the abdomen were obtained out to 60 minutes following intravenous administration of radiopharmaceutical. After oral ingestion of Ensure, gallbladder ejection fraction was determined. At 60 min, normal ejection fraction is greater than 33%. RADIOPHARMACEUTICALS:  5.0 mCi Tc-78m Choletec IV COMPARISON:  Abdomen ultrasound dated 04/20/2019. FINDINGS: Prompt uptake and biliary excretion of activity by the liver is seen. Gallbladder activity is visualized, consistent with patency of cystic duct. Biliary activity passes into small bowel, consistent with patent common bile duct. Calculated gallbladder ejection fraction is 72%. (Normal gallbladder ejection fraction with Ensure is greater than 33%.) IMPRESSION: Normal hepatobiliary imaging with normal gallbladder function. Electronically Signed   By: DLajean ManesM.D.   On: 04/21/2019 13:53   [place]Abdomen Limited  Result Date: 04/26/2019 CLINICAL DATA:  Acute pain. CT demonstrates right pleural effusion, gallbladder wall thickening, abdominal ascites EXAM: ULTRASOUND ABDOMEN LIMITED RIGHT UPPER QUADRANT COMPARISON:  CT from earlier the same day FINDINGS: Gallbladder: Incompletely distended. Wall thickening up to 1.3 cm. Innumerable subcentimeter layering calculi in the dependent aspect. Small amount of pericholecystic fluid. Sonographer describes no sonographic Murphy's sign. Common bile  duct: Not demonstrated Liver: Limited visualization. No discrete lesion or biliary ductal dilatation identified. Other: Small volume perihepatic ascites. 4-quadrant survey shows only scattered small volume abdominal ascites. No adequate pocket for safe percutaneous paracentesis. IMPRESSION: 1. Cholelithiasis and gallbladder wall thickening, without sonographic Murphy's sign. 2. Small volume abdominal ascites.  Paracentesis deferred. Electronically Signed   By: DLucrezia EuropeM.D.   On: 04/26/2019 13:21   Dg Chest Port 1 View  Result Date: 04/23/2019 CLINICAL DATA:  Leukocytosis.  Generalized weakness. EXAM: PORTABLE CHEST 1 VIEW COMPARISON:  04/17/2019 FINDINGS: Lungs are adequately inflated and otherwise clear. Cardiomediastinal silhouette and remainder of the exam is unchanged. IMPRESSION: No active disease. Electronically Signed   By: DMarin OlpM.D.   On: 04/23/2019 09:12   Dg Chest Port 1 View  Result Date: 04/17/2019 CLINICAL DATA:  Leukocytosis. EXAM: PORTABLE CHEST 1 VIEW COMPARISON:  10/15/2018 FINDINGS: The cardiomediastinal silhouette is within normal limits. No airspace consolidation, edema, pleural effusion, pneumothorax is identified. No acute osseous abnormality is seen. IMPRESSION: No active disease. Electronically Signed   By: ALogan BoresM.D.   On: 04/17/2019 06:14   Ct Head Code Stroke Wo Contrast  Result Date: 04/16/2019 CLINICAL DATA:  Code stroke.  Aphasia EXAM: CT HEAD WITHOUT CONTRAST TECHNIQUE: Contiguous axial images were obtained from the base of the skull through the vertex without intravenous contrast. COMPARISON:  None. FINDINGS: Brain: There is no mass, hemorrhage or extra-axial collection. There is generalized atrophy without lobar predilection. There is hypoattenuation of the periventricular white matter, most commonly indicating chronic ischemic microangiopathy. Vascular: No abnormal hyperdensity of the major intracranial arteries or dural venous sinuses. No  intracranial atherosclerosis. Skull: The visualized skull base, calvarium and extracranial soft tissues are normal. Sinuses/Orbits: No fluid levels or advanced mucosal thickening of the visualized paranasal sinuses. No mastoid or middle ear effusion. The orbits are normal. ASPECTS ([facility] Program Early CT Score) - Ganglionic level infarction (caudate, lentiform nuclei,  internal capsule, insula, M1-M3 cortex): 7 - Supraganglionic infarction (M4-M6 cortex): 3 Total score (0-10 with 10 being normal): 10 IMPRESSION: 1. No intracranial hemorrhage or other acute finding. 2. ASPECTS is 10. 3. These results were communicated to Dr. Karena Addison Aroor at 3:05 am on 04/16/2019 by text page via the [facility] messaging system. Electronically Signed   By: Ulyses Jarred M.D.   On: 04/16/2019 03:06   Vas [place] Lower Extremity Venous (dvt)  Result Date: 04/23/2019  Lower Venous Study Indications: Leukocytosis.  Comparison Study: No prior study. Performing Technologist: Maudry Mayhew MHA, RDMS, RVT, RDCS  Examination Guidelines: A complete evaluation includes B-mode imaging, spectral Doppler, color Doppler, and power Doppler as needed of all accessible portions of each vessel. Bilateral testing is considered an integral part of a complete examination. Limited examinations for reoccurring indications may be performed as noted.  +---------+---------------+---------+-----------+----------+--------------+  RIGHT     Compressibility Phasicity Spontaneity Properties Thrombus Aging  +---------+---------------+---------+-----------+----------+--------------+  CFV       Full            Yes       Yes                                    +---------+---------------+---------+-----------+----------+--------------+  SFJ       Full                                                             +---------+---------------+---------+-----------+----------+--------------+  FV Prox   Full                                                              +---------+---------------+---------+-----------+----------+--------------+  FV Mid    Full                                                             +---------+---------------+---------+-----------+----------+--------------+  FV Distal Full                                                             +---------+---------------+---------+-----------+----------+--------------+  PFV       Full                                                             +---------+---------------+---------+-----------+----------+--------------+  POP       Full            Yes       Yes                                    +---------+---------------+---------+-----------+----------+--------------+  PTV       Full                                                             +---------+---------------+---------+-----------+----------+--------------+  PERO      Full                                                             +---------+---------------+---------+-----------+----------+--------------+   +---------+---------------+---------+-----------+----------+--------------+  LEFT      Compressibility Phasicity Spontaneity Properties Thrombus Aging  +---------+---------------+---------+-----------+----------+--------------+  CFV       Full            Yes       Yes                                    +---------+---------------+---------+-----------+----------+--------------+  SFJ       Full                                                             +---------+---------------+---------+-----------+----------+--------------+  FV Prox   Full                                                             +---------+---------------+---------+-----------+----------+--------------+  FV Mid    Full                                                             +---------+---------------+---------+-----------+----------+--------------+  FV Distal Full                                                              +---------+---------------+---------+-----------+----------+--------------+  PFV       Full                                                             +---------+---------------+---------+-----------+----------+--------------+  POP       Full            Yes       Yes                                    +---------+---------------+---------+-----------+----------+--------------+  PTV       Full                                                             +---------+---------------+---------+-----------+----------+--------------+  PERO      Full                                                             +---------+---------------+---------+-----------+----------+--------------+     Summary: Right: There is no evidence of deep vein thrombosis in the lower extremity. No cystic structure found in the popliteal fossa. Left: There is no evidence of deep vein thrombosis in the lower extremity. A cystic structure is found in the popliteal fossa.  *See table(s) above for measurements and observations. Electronically signed by Servando Snare MD on 04/23/2019 at 6:12:30 PM.    Final    ASSESSMENT AND PLAN:  This is a 63 year old female with  1.  Leukocytosis: The patient has had leukocytosis dating back to at least May 2020.  Prior to this time, her white blood cell count was normal.  Her leukocytosis seems to be characterized by neutrophilia.  Given her intermittent fevers, infection is a consideration.  I have discussed with ID, and no source of infection has been identified.  They are planning to de-escalate antibiotics and do not think leukocytosis is related to infection.  The patient may need additional work-up for myeloproliferative neoplasm such as CML.  2.  Splenic infarcts: The patient was noted to have multiple wedge-shaped areas of hypoattenuation in the spleen that likely represent splenic infarcts.  TEE negative for cardiac thrombus and endocarditis.  Etiology is not entirely clear.  The patient has known  alcoholic cirrhosis and ID has indicated that she could have pancreatitis and both have been associated with splenic infarct.  Given her leukocytosis, may need additional work-up for malignancy.  May also need hyper coag work-up.  3.  Macrocytic anemia: The patient has a history of anemia dating back to at least 2019.  This is likely related to her alcoholism.  She has no evidence of B12 or folate deficiency.  Ferritin was elevated on 04/23/2019.  Recommend close monitoring.  Transfuse for hemoglobin less than 7.  4.  Alcoholic cirrhosis and alcohol-related seizure: The patient was counseled to avoid alcohol to prevent further progression of her cirrhosis.   Thank you for this referral.  Mikey Bussing, DNP, AGPCNP-BC, AOCNP    Patient seen and examined.  For full details please see note by Altamese Dilling, DNP.  63 year old woman with cirrhosis of the liver and polysubstance abuse hospitalized for seizures and had an incidental finding of splenic infarct and leukocytosis.  Laboratory data for the last few years reviewed and she has had fluctuating leukocytosis since May 2020.  Her peripheral smear does show predominantly neutrophilia and vacuolated neutrophils without any immature or abnormal cells.  CT scan of the abdomen pelvis was reviewed which showed ascites and small pleural effusion as well as splenic infarcts.   Clinically, she reports feeling reasonably fair without any fevers or chills at this time.  She denies any abdominal pain but  does report some distention.  She is eating well at this time without any documented source of infection.   Impression and recommendation:  1.  Splenic infarcts: Etiology remains unclear at this time.  No clear infectious process has been identified and embolic etiology has not been identified.  This could be related to a vascular issues related to her cirrhosis of the liver and portal hypertension.  Myeloproliferative disorder could also be a  possibility but considered less likely at this time.  Hypercoagulable panel would likely be of little value at this time unless a documented acute thrombosis is noted including DVT or a PE.  From a management standpoint, I would recommend hematological work-up for myeloproliferative disorder such as chronic myelogenous leukemia, essential thrombocytosis and myelofibrosis although the likelihood is low at this time.  This work-up can take place as an outpatient.  2.  Leukocytosis: Appears to be reactive in nature rather versus a myeloproliferative disorder.  The pattern does not fit myeloproliferative disorder by chronic myelogenous leukemia certainly a possibility.  I do not see any evidence to suggest acute hematological process at this time that warrants any immediate intervention or evaluation.  We will arrange follow-up as an outpatient to evaluate for myeloproliferative disorder including laboratory testing and bone marrow biopsy if no abnormalities are detected on her peripheral blood and this pattern persists in the future.  I do not think any of her symptoms are related to an underlying hematologic condition I have no objections to discharge when she is medically fit by primary team.   Zola Button MD 04/29/2019

## 2019-04-29 NOTE — Progress Notes (Signed)
[facility]  Date of Admission:  04/16/2019     Total days of antibiotics 11 Ceftriaxone 11/27 >> 11/29 Zoysn 11/30 >> 12/4 Vancomycin 12/6 >>04/29/19 Ampicillin-Sulbactam >> 12/6 >>         ASSESSMENT:  Christie Morrison has a negative TEE making it unlikely that her splenic infarcts are a result of infectious source. There is currently no evidence of infection to explain her continued leukocytosis as blood cultures have been negative and there is no acute evidence of cholecystitis. Recommend completing treatment with Unasyn or may change to oral Augmentin for additional 3 days to ensure adequate coverage. Primary team has consulted Hematology for further evaluation of leukocytosis and splenic infarcts.  ID will sign off and be available as needed.   PLAN:  1. Continue Unasyn for 3 additional days with end date of 12/12. If going to discharge may change to oral Augmentin 875/125 bid until 12/12.  2. Discontinue vancomycin.  3. Cirrhosis management per primary team  4. No follow up with ID needed.   Principal Problem:   Alcohol withdrawal seizure ([place]) Active Problems:   Cocaine abuse ([facility])   Cirrhosis, alcoholic ([place])   Hypokalemia   Hypocalcemia   Hypomagnesemia   Prolonged QT interval   Macrocytic anemia   Hyponatremia   AKI (acute kidney injury) ([place])   Seizure ([place])   Klebsiella infection   Splenic infarct   Sepsis ([place])   . enoxaparin (LOVENOX) injection  40 mg Subcutaneous Q24H  . folic acid  1 mg Oral Daily  . magnesium oxide  400 mg Oral Daily  . multivitamin with minerals  1 tablet Oral Daily  . potassium chloride SA  20 mEq Oral Daily  . thiamine  100 mg Oral Daily   Or  . thiamine  100 mg Intravenous Daily    SUBJECTIVE:  Afebrile overnight with continued leukocytosis now with WBC count of 35.1. TEE without evidence of endocarditis. Wanting to know when she can go home.   No Known Allergies   Review of Systems: Review of  Systems  Constitutional: Negative for chills, fever and weight loss.  Respiratory: Negative for cough, shortness of breath and wheezing.   Cardiovascular: Negative for chest pain and leg swelling.  Gastrointestinal: Negative for abdominal pain, constipation, diarrhea, nausea and vomiting.  Skin: Negative for rash.      OBJECTIVE: Vitals:   04/29/19 0353 04/29/19 0406 04/29/19 0851 04/29/19 1122  BP: (!) 165/95  (!) 160/98 (!) 142/82  Pulse: 74  77 82  Resp: 17  18 18   Temp: 97.8 F (36.6 C)  98 F (36.7 C) 98.3 F (36.8 C)  TempSrc: Oral  Oral Oral  SpO2: 98%  100% 98%  Weight:  52 kg    Height:       Body mass index is 16.45 kg/m.  Physical Exam Constitutional:      General: She is not in acute distress.    Appearance: She is well-developed.     Comments: Lying in bed with head of bed elevated; pleasant.   Cardiovascular:     Rate and Rhythm: Normal rate and regular rhythm.     Heart sounds: Normal heart sounds.  Pulmonary:     Effort: Pulmonary effort is normal.     Breath sounds: Normal breath sounds.  Skin:    General: Skin is warm and dry.  Neurological:     Mental Status: She is alert and oriented to person, place, and time.  Psychiatric:        Mood and Affect: Mood normal.     Lab Results Lab Results  Component Value Date   WBC 35.1 (H) 04/29/2019   HGB 8.2 (L) 04/29/2019   HCT 24.2 (L) 04/29/2019   MCV 98.8 04/29/2019   PLT 349 04/29/2019    Lab Results  Component Value Date   CREATININE 1.34 (H) 04/29/2019   BUN 10 04/29/2019   NA 141 04/29/2019   K 4.1 04/29/2019   CL 105 04/29/2019   CO2 22 04/29/2019    Lab Results  Component Value Date   ALT 14 04/29/2019   AST 24 04/29/2019   ALKPHOS 183 (H) 04/29/2019   BILITOT 0.7 04/29/2019     Microbiology: Recent Results (from the past 240 hour(s))  Culture, blood (routine x 2)     Status: None   Collection Time: 04/23/19  8:37 AM   Specimen: BLOOD RIGHT ARM  Result Value Ref Range  Status   Specimen Description BLOOD RIGHT ARM  Final   Special Requests   Final    BOTTLES DRAWN AEROBIC ONLY Blood Culture results may not be optimal due to an inadequate volume of blood received in culture bottles   Culture   Final    NO GROWTH 5 DAYS Performed at [facility], [place] [address] [place], [place] [address]    Report Status 04/28/2019 FINAL  Final  Culture, blood (routine x 2)     Status: None   Collection Time: 04/23/19  8:46 AM   Specimen: BLOOD RIGHT HAND  Result Value Ref Range Status   Specimen Description BLOOD RIGHT HAND  Final   Special Requests   Final    BOTTLES DRAWN AEROBIC ONLY Blood Culture results may not be optimal due to an inadequate volume of blood received in culture bottles   Culture   Final    NO GROWTH 5 DAYS Performed at [facility], [place] [address], [place], [place] [address]    Report Status 04/28/2019 FINAL  Final  Culture, Urine     Status: None   Collection Time: 04/23/19 12:52 PM   Specimen: Urine, Random  Result Value Ref Range Status   Specimen Description URINE, RANDOM  Final   Special Requests NONE  Final   Culture   Final    NO GROWTH Performed at [facility], [place] [address] [place], [place] [address]    Report Status 04/24/2019 FINAL  Final  Culture, Urine     Status: None   Collection Time: 04/23/19  4:33 PM   Specimen: Urine, Random  Result Value Ref Range Status   Specimen Description URINE, RANDOM  Final   Special Requests NONE  Final   Culture   Final    NO GROWTH Performed at [facility], [place] [address] [place], [place] [address]    Report Status 04/24/2019 FINAL  Final  Culture, blood (routine x 2)     Status: None (Preliminary result)   Collection Time: 04/25/19  3:00 PM   Specimen: BLOOD RIGHT HAND  Result Value Ref Range Status   Specimen Description BLOOD RIGHT HAND  Final   Special Requests AEROBIC BOTTLE ONLY Blood Culture adequate volume  Final   Culture   Final    NO GROWTH  4 DAYS Performed at [facility], [place] [address], [place],  [address]    Report Status PENDING  Incomplete  Culture, blood (routine x 2)  Status: None (Preliminary result)   Collection Time: 04/25/19  3:04 PM   Specimen: BLOOD RIGHT WRIST  Result Value Ref Range Status   Specimen Description BLOOD RIGHT WRIST  Final   Special Requests AEROBIC BOTTLE ONLY Blood Culture adequate volume  Final   Culture   Final    NO GROWTH 4 DAYS Performed at [facility], [address], [place], [place] [address]    Report Status PENDING  Incomplete  MRSA PCR Screening     Status: None   Collection Time: 04/27/19  1:37 PM   Specimen: Nasal Mucosa; Nasopharyngeal  Result Value Ref Range Status   MRSA by PCR NEGATIVE NEGATIVE Final    Comment:        The GeneXpert MRSA Assay (FDA approved for NASAL specimens only), is one component of a comprehensive MRSA colonization surveillance program. It is not intended to diagnose MRSA infection nor to guide or monitor treatment for MRSA infections. Performed at [facility], [place] [address] [place], [place] [address]      Terri Piedra, [place] [facility] [phone] Pager  04/29/2019  11:50 AM

## 2019-04-29 NOTE — Progress Notes (Signed)
PROGRESS NOTE    Christie Morrison   [id number]  DOB: [birth year]  DOA: 04/16/2019 PCP: Gildardo Pounds, NP   Brief Narrative:  [name] a 63 y.o.Fwith hx of alcoholic cirrhosis, ongoing alcohol use, chronic hypomagnesemia and hypokalemia, and alcohol-related seizure in May of this yearwho presents withseizure. In the ER, patient was tachycardic, blood pressure slightly elevated. Initially called "code stroke" because of aphasia, but this was subsequently canceled as it was thought to be due to postictal state. Sodium 133, potassium 2.9, magnesium 0.5, calcium 7.6, creatinine 1.6 (up from baseline 0.8), mild transaminitis, leukocytosis, macrocytic anemia. Anion gap 25, CT head unremarkable.  She was given IV magnesium, IV calcium, IV potassium, and started on Keppra. Neurology were consulted and the hospital service was asked to admit for severe electrolyte derangements and seizure.  Subjective: She has no complaints this AM.   Assessment & Plan:   Principal Problem:   Alcohol withdrawal seizure -This was the primary reason for the patient's admission -According to the history, the patient was noted to have a seizure at home which was witnessed by her son after she had reduce the amount of alcohol intake -Patient was evaluated by neurology in the hospital-EEG was negative -She went through alcohol withdrawal and now this has resolved -She has been counseled on the amount of her alcohol use and recommended to avoid alcohol altogether  Active Problems:    Sepsis due to UTI -present on admission -Patient was noted to have leukocytosis tachycardia and a UA suggestive of a UTI on 11/27 -Urine culture grew out Klebsiella pneumonia and based on sensitivities her antibiotics were narrowed -The patient was noted to have a fever spike of 103 on 12/5 -Extensive work-up was done-abdominal ultrasound was suggestive of acute acalculous  cholecystitis-general surgery was consulted and a HIDA scan was obtained which was found to be normal -The patient did have some diarrhea during her hospital stay but this was negative for infectious causes  and has resolved -- she was found to have trichomonas and this has been treated as well -ID has been following and assisting with determining etiology-WBC count continues to rise steadily however no other infectious source has been found -TEE was performed on 12-8 and found to be negative for endocarditis    Splenic infarcts -These were noted incidentally on CT abdomen pelvis on 12/5 -per radiology read> Multiple peripheral wedge-shaped areas of hypoattenuation in the spleen are noted. - Mentioned above, aTEE was performed and was negative for cardiac thrombus -There has been no noted A. fib during her hospital stay - will consult with Heme to help find a cause for both her splenic infarcts and ongoing leukocytosis which does not appear to be explained by any underlying infection.  Multiple electrolyte abnormalities including hypomagnesemia, hyponatremia, hypophosphatemia and hypokalemia -Nearly resolved-her magnesium level remains borderline and we are replacing this    Cocaine abuse -UDS was positive for cocaine and benzodiazepines (benzodiazepines were given by medical staff for seizure) -Has been counseled to discontinue use cocaine    Cirrhosis, alcoholic  -Ultrasound revealed nodular hepatic contour suggestive of cirrhosis -LFTs were initially elevated and have steadily trended down -CT of the abdomen and pelvis has shown small volume ascites as well -Hepatitis panel is noted to be negative -As mentioned above, she has been counseled to avoid alcohol altogether to prevent further progression of cirrhosis      Prolonged QT interval   Macrocytic anemia    AKI versus  CKD -Creatinine levels back in the spring were elevated and ranging from 1.2-1.78 -In the hospital during this  admission she has ranged from 1.1-1.4/ -BUN she may have CKD stage III.    Time spent in minutes: 40 minutes in extensive review of chart DVT prophylaxis: Lovenox Code Status: Full code Family Communication:  Disposition Plan: Eventually will go home with home health PT and OT Consultants:   ID  Cardiology Procedures:   TEE Antimicrobials:  Anti-infectives (From admission, onward)   Start     Dose/Rate Route Frequency Ordered Stop   04/26/19 2100  Ampicillin-Sulbactam (UNASYN) 3 g in sodium chloride 0.9 % 100 mL IVPB     3 g 200 mL/hr over 30 Minutes Intravenous Every 6 hours 04/26/19 1955     04/26/19 2100  vancomycin (VANCOCIN) 1,250 mg in sodium chloride 0.9 % 250 mL IVPB     1,250 mg 166.7 mL/hr over 90 Minutes Intravenous Every 48 hours 04/26/19 2021     04/24/19 1600  metroNIDAZOLE (FLAGYL) tablet 2,000 mg     2,000 mg Oral  Once 04/24/19 1531 04/24/19 1807   04/20/19 2030  piperacillin-tazobactam (ZOSYN) IVPB 3.375 g  Status:  Discontinued     3.375 g 12.5 mL/hr over 240 Minutes Intravenous Every 8 hours 04/20/19 1339 04/24/19 1313   04/20/19 1345  piperacillin-tazobactam (ZOSYN) IVPB 3.375 g     3.375 g 100 mL/hr over 30 Minutes Intravenous  Once 04/20/19 1339 04/20/19 1537   04/19/19 1500  cephALEXin (KEFLEX) capsule 500 mg  Status:  Discontinued     500 mg Oral Every 8 hours 04/19/19 1447 04/20/19 1335   04/17/19 1000  cefTRIAXone (ROCEPHIN) 1 g in sodium chloride 0.9 % 100 mL IVPB  Status:  Discontinued     1 g 200 mL/hr over 30 Minutes Intravenous Every 24 hours 04/17/19 0910 04/19/19 1447       Objective: Vitals:   04/28/19 2348 04/29/19 0353 04/29/19 0406 04/29/19 0851  BP: 125/75 (!) 165/95  (!) 160/98  Pulse: 73 74  77  Resp: 17 17  18   Temp: 97.8 F (36.6 C) 97.8 F (36.6 C)  98 F (36.7 C)  TempSrc: Oral Oral  Oral  SpO2: 96% 98%  100%  Weight:   52 kg   Height:        Intake/Output Summary (Last 24 hours) at 04/29/2019 0906 Last data  filed at 04/29/2019 0700 Gross per 24 hour  Intake 1180 ml  Output -  Net 1180 ml   Filed Weights   04/22/19 0500 04/28/19 1118 04/29/19 0406  Weight: 59.1 kg 52.2 kg 52 kg    Examination: General exam: Appears comfortable  HEENT: PERRLA, oral mucosa moist, no sclera icterus or thrush Respiratory system: Clear to auscultation. Respiratory effort normal. Cardiovascular system: S1 & S2 heard, RRR.   Gastrointestinal system: Abdomen soft, non-tender, nondistended. Normal bowel sounds. Central nervous system: Alert and oriented. No focal neurological deficits. Extremities: No cyanosis, clubbing or edema Skin: No rashes or ulcers Psychiatry:  Mood & affect appropriate.     Data Reviewed: I have personally reviewed following labs and imaging studies  CBC: Recent Labs  Lab 04/25/19 0303 04/26/19 0340 04/27/19 1004 04/28/19 0348 04/29/19 0410  WBC 25.3* 28.1* 28.4* 27.8* 35.1*  NEUTROABS 19.9* 24.4* 21.4* 21.8* 31.5*  HGB 7.7* 8.0* 7.9* 7.8* 8.2*  HCT 22.4* 24.2* 22.9* 21.6* 24.2*  MCV 100.0 101.7* 98.3 96.0 98.8  PLT 388 394 PLATELET CLUMPS NOTED ON SMEAR, UNABLE TO  ESTIMATE [phone]   Basic Metabolic Panel: Recent Labs  Lab 04/25/19 0303 04/26/19 0340 04/27/19 1004 04/28/19 0348 04/29/19 0410  NA 137 138 139 138 141  K 4.2 4.0 3.9 4.0 4.1  CL 102 106 105 105 105  CO2 25 24 23 24 22   GLUCOSE 83 122* 81 79 188*  BUN 10 8 6* 6* 10  CREATININE 1.26* 1.11* 1.11* 1.10* 1.34*  CALCIUM 8.9 9.3 9.1 8.7* 8.2*  MG 1.9 1.6* 1.5* 1.9 1.6*  PHOS 2.4* 1.9* 4.3 5.4* 5.8*   GFR: Estimated Creatinine Clearance: 35.7 mL/min (A) (by C-G formula based on SCr of 1.34 mg/dL (H)). Liver Function Tests: Recent Labs  Lab 04/25/19 0303 04/26/19 0340 04/27/19 1004 04/28/19 0348 04/29/19 0410  AST 49* 43* 33 25 24  ALT 24 25 20 16 14   ALKPHOS 199* 210* 222* 178* 183*  BILITOT 0.7 0.6 0.9 0.7 0.7  PROT 6.4* 6.5 7.1 6.5 6.8  ALBUMIN 2.7* 2.8* 2.6* 2.4* 2.3*   Recent Labs  Lab  04/25/19 0303  LIPASE 22   No results for input(s): AMMONIA in the last 168 hours. Coagulation Profile: Recent Labs  Lab 04/27/19 1004  INR 1.3*   Cardiac Enzymes: No results for input(s): CKTOTAL, CKMB, CKMBINDEX, TROPONINI in the last 168 hours. BNP (last 3 results) No results for input(s): PROBNP in the last 8760 hours. HbA1C: No results for input(s): HGBA1C in the last 72 hours. CBG: Recent Labs  Lab 04/25/19 1705  GLUCAP 79   Lipid Profile: No results for input(s): CHOL, HDL, LDLCALC, TRIG, CHOLHDL, LDLDIRECT in the last 72 hours. Thyroid Function Tests: No results for input(s): TSH, T4TOTAL, FREET4, T3FREE, THYROIDAB in the last 72 hours. Anemia Panel: No results for input(s): VITAMINB12, FOLATE, FERRITIN, TIBC, IRON, RETICCTPCT in the last 72 hours. Urine analysis:    Component Value Date/Time   COLORURINE YELLOW 04/23/2019 1900   APPEARANCEUR HAZY (A) 04/23/2019 1900   LABSPEC 1.013 04/23/2019 1900   PHURINE 7.0 04/23/2019 1900   GLUCOSEU NEGATIVE 04/23/2019 1900   HGBUR NEGATIVE 04/23/2019 1900   BILIRUBINUR NEGATIVE 04/23/2019 1900   KETONESUR NEGATIVE 04/23/2019 1900   PROTEINUR NEGATIVE 04/23/2019 1900   NITRITE NEGATIVE 04/23/2019 1900   LEUKOCYTESUR LARGE (A) 04/23/2019 1900   Sepsis Labs: @LABRCNTIP (procalcitonin:4,lacticidven:4) ) Recent Results (from the past 240 hour(s))  Culture, blood (routine x 2)     Status: None   Collection Time: 04/23/19  8:37 AM   Specimen: BLOOD RIGHT ARM  Result Value Ref Range Status   Specimen Description BLOOD RIGHT ARM  Final   Special Requests   Final    BOTTLES DRAWN AEROBIC ONLY Blood Culture results may not be optimal due to an inadequate volume of blood received in culture bottles   Culture   Final    NO GROWTH 5 DAYS Performed at [facility], [place] [address], [place],  [address]    Report Status 04/28/2019 FINAL  Final  Culture, blood (routine x 2)     Status: None   Collection Time:  04/23/19  8:46 AM   Specimen: BLOOD RIGHT HAND  Result Value Ref Range Status   Specimen Description BLOOD RIGHT HAND  Final   Special Requests   Final    BOTTLES DRAWN AEROBIC ONLY Blood Culture results may not be optimal due to an inadequate volume of blood received in culture bottles   Culture   Final    NO GROWTH 5 DAYS Performed at [facility],  [address] [place], [place] [address]    Report Status 04/28/2019 FINAL  Final  Culture, Urine     Status: None   Collection Time: 04/23/19 12:52 PM   Specimen: Urine, Random  Result Value Ref Range Status   Specimen Description URINE, RANDOM  Final   Special Requests NONE  Final   Culture   Final    NO GROWTH Performed at [facility], [place] [address] [place], [place] [address]    Report Status 04/24/2019 FINAL  Final  Culture, Urine     Status: None   Collection Time: 04/23/19  4:33 PM   Specimen: Urine, Random  Result Value Ref Range Status   Specimen Description URINE, RANDOM  Final   Special Requests NONE  Final   Culture   Final    NO GROWTH Performed at [facility], [place] [address], [place], [place] [address]    Report Status 04/24/2019 FINAL  Final  Culture, blood (routine x 2)     Status: None (Preliminary result)   Collection Time: 04/25/19  3:00 PM   Specimen: BLOOD RIGHT HAND  Result Value Ref Range Status   Specimen Description BLOOD RIGHT HAND  Final   Special Requests AEROBIC BOTTLE ONLY Blood Culture adequate volume  Final   Culture   Final    NO GROWTH 3 DAYS Performed at [facility], [place] [address], [place], [place] [address]    Report Status PENDING  Incomplete  Culture, blood (routine x 2)     Status: None (Preliminary result)   Collection Time: 04/25/19  3:04 PM   Specimen: BLOOD RIGHT WRIST  Result Value Ref Range Status   Specimen Description BLOOD RIGHT WRIST  Final   Special Requests AEROBIC BOTTLE ONLY Blood Culture adequate volume  Final   Culture   Final    NO  GROWTH 3 DAYS Performed at [facility], [address], [place], [place] [address]    Report Status PENDING  Incomplete  MRSA PCR Screening     Status: None   Collection Time: 04/27/19  1:37 PM   Specimen: Nasal Mucosa; Nasopharyngeal  Result Value Ref Range Status   MRSA by PCR NEGATIVE NEGATIVE Final    Comment:        The GeneXpert MRSA Assay (FDA approved for NASAL specimens only), is one component of a comprehensive MRSA colonization surveillance program. It is not intended to diagnose MRSA infection nor to guide or monitor treatment for MRSA infections. Performed at [facility], [place] [address] [place], [place] [address]          Radiology Studies: Nm Hepatobiliary Liver Func  Result Date: 04/27/2019 CLINICAL DATA:  Right upper quadrant pain, fever, leukocytosis positive Murphy sign EXAM: NUCLEAR MEDICINE HEPATOBILIARY IMAGING TECHNIQUE: Sequential images of the abdomen were obtained out to 60 minutes following intravenous administration of radiopharmaceutical. RADIOPHARMACEUTICALS:  5.0 mCi Tc-12m  Choletec IV COMPARISON:  Ultrasound 04/26/2019, CT abdomen pelvis 04/25/2019, HIDA with gallbladder ejection fraction 04/21/2019 FINDINGS: Prompt uptake and biliary excretion of activity by the liver is seen. Gallbladder activity is visualized, consistent with patency of cystic duct. Biliary activity passes into small bowel, consistent with patent common bile duct. IMPRESSION: No scintigraphic evidence to suggest obstruction of the common bile duct, cystic duct or features of acute cholecystitis. Electronically Signed   By: Lovena Le M.D.   On: 04/27/2019 17:50      Scheduled Meds: . enoxaparin (LOVENOX)  injection  40 mg Subcutaneous Q24H  . folic acid  1 mg Oral Daily  . magnesium oxide  400 mg Oral Daily  . multivitamin with minerals  1 tablet Oral Daily  . potassium chloride SA  20 mEq Oral Daily  . thiamine  100 mg Oral Daily   Or  . thiamine  100 mg  Intravenous Daily   Continuous Infusions: . sodium chloride Stopped (04/29/19 0338)  . ampicillin-sulbactam (UNASYN) IV 200 mL/hr at 04/29/19 0400  . magnesium sulfate bolus IVPB    . vancomycin Stopped (04/28/19 2341)     LOS: 12 days      Debbe Odea, MD [facility] Pager: [email or web address] Password [email or web address] 04/29/2019, 9:06 AM

## 2019-04-29 NOTE — Progress Notes (Addendum)
PT Cancellation Note  Patient Details Name: Christie Morrison MRN: [id number] DOB: [birth year]   Cancelled Treatment:    Reason Eval/Treat Not Completed: (P) Pain limiting ability to participate(Pt eating on first attempt at 1610, returned at 1722 and reports stomach pain and need for pain medicine.  Pt reports "I'm not doing it today.")   [name] Eli Hose 04/29/2019, 5:25 PM Erasmo Leventhal , PTA Acute Rehabilitation Services Pager [phone] Office [phone]

## 2019-04-29 NOTE — Progress Notes (Signed)
Occupational Therapy Treatment Patient Details Name: Christie Morrison MRN: [id number] DOB: 23-Feb-1956 Today's Date: 04/29/2019    History of present illness 63yo female presenting with seizure, confusion and disorientation. CT negative for acute CVA and code stroke ultimately cancelled. Admitted for seizure like activity in setting of severe electrolyte derangement. PMH cocaine use, ROH abuse, HLD, seizure   OT comments  Pt pleasantly agreeable to OT session this date. Pt requires minA for simulated tub transfer and minguard-minA during functional mobility for stability and safe use of DME. She applied lotion to UB and LB standing at sink level with minA. Pt demonstrates instability without UE support during functional mobility, RW increases stability, independence and safety during mobility and completion of ADL. Pt will continue to benefit from skilled OT services to maximize safety and independence with ADL/IADL and functional mobility. Will continue to follow acutely and progress as tolerated.    Follow Up Recommendations  Home health OT;Supervision/Assistance - 24 hour    Equipment Recommendations  Tub/shower seat    Recommendations for Other Services      Precautions / Restrictions Precautions Precautions: Fall       Mobility Bed Mobility Overal bed mobility: Independent                Transfers Overall transfer level: Needs assistance Equipment used: Rolling walker (2 wheeled);None Transfers: Sit to/from Stand Sit to Stand: Min guard         General transfer comment: minA for functional mobiltiy without AD;minguard with RW    Balance Overall balance assessment: Needs assistance Sitting-balance support: Feet supported Sitting balance-Leahy Scale: Normal     Standing balance support: Single extremity supported;During functional activity Standing balance-Leahy Scale: Fair Standing balance comment: minor instability with single UE support                             ADL either performed or assessed with clinical judgement   ADL Overall ADL's : Needs assistance/impaired     Grooming: Min guard;Standing;Wash/dry hands Grooming Details (indicate cue type and reason): completed at sink level Upper Body Bathing: Min guard;Standing Upper Body Bathing Details (indicate cue type and reason): applied lotion to upper body standing at sink level Lower Body Bathing: Minimal assistance;Sit to/from stand Lower Body Bathing Details (indicate cue type and reason): pt applied lotion to BLE while standing at sink level;vc to increase awaress of safety;minA for stability          Toilet Transfer: Min guard;Minimal assistance;Ambulation;RW Toilet Transfer Details (indicate cue type and reason): simulated vc for safe use of DME     Tub/ Shower Transfer: Minimal assistance Tub/Shower Transfer Details (indicate cue type and reason): simulated stepping over elevated surface;pt with poor foot clearance Functional mobility during ADLs: Min guard;Minimal assistance;Rolling walker General ADL Comments: minguard-minA for stability during mobiltiy;     Vision   Vision Assessment?: No apparent visual deficits   Perception     Praxis      Cognition Arousal/Alertness: Awake/alert Behavior During Therapy: WFL for tasks assessed/performed Overall Cognitive Status: No family/caregiver present to determine baseline cognitive functioning Area of Impairment: Safety/judgement;Memory                     Memory: Decreased short-term memory   Safety/Judgement: Decreased awareness of safety;Decreased awareness of deficits     General Comments: pt required vc for safe use of DME;poor follow through with use of RW during mobility;  Exercises     Shoulder Instructions       General Comments      Pertinent Vitals/ Pain       Pain Assessment: No/denies pain  Home Living                                           Prior Functioning/Environment              Frequency  Min 2X/week        Progress Toward Goals  OT Goals(current goals can now be found in the care plan section)  Progress towards OT goals: Progressing toward goals  Acute Rehab OT Goals Patient Stated Goal: to go back to work  OT Goal Formulation: With patient Time For Goal Achievement: 05/02/19 Potential to Achieve Goals: Good ADL Goals Pt Will Perform Grooming: with modified independence;standing Pt Will Perform Upper Body Bathing: with modified independence;sitting Pt Will Perform Lower Body Bathing: with modified independence;sit to/from stand Pt Will Perform Upper Body Dressing: with modified independence;sitting Pt Will Perform Lower Body Dressing: with modified independence;sit to/from stand Pt Will Transfer to Toilet: with modified independence;ambulating;regular height toilet;grab bars Pt Will Perform Toileting - Clothing Manipulation and hygiene: with modified independence;sit to/from stand Pt Will Perform Tub/Shower Transfer: Tub transfer;with min guard assist;ambulating;shower seat  Plan Discharge plan remains appropriate    Co-evaluation                 AM-PAC OT "6 Clicks" Daily Activity     Outcome Measure   Help from another person eating meals?: None Help from another person taking care of personal grooming?: A Little Help from another person toileting, which includes using toliet, bedpan, or urinal?: A Little Help from another person bathing (including washing, rinsing, drying)?: A Little Help from another person to put on and taking off regular upper body clothing?: A Little Help from another person to put on and taking off regular lower body clothing?: A Little 6 Click Score: 19    End of Session Equipment Utilized During Treatment: Gait belt;Rolling walker  OT Visit Diagnosis: Unsteadiness on feet (R26.81);Cognitive communication deficit (R41.841)   Activity Tolerance Patient  tolerated treatment well   Patient Left in chair;with call bell/phone within reach;with chair alarm set   Nurse Communication Mobility status        Time: [id number] OT Time Calculation (min): 13 min  Charges: OT General Charges $OT Visit: 1 Visit OT Treatments $Self Care/Home Management : 8-22 mins  Dorinda Hill OTR/L Acute Rehabilitation Services Office: [place] 04/29/2019, 3:58 PM

## 2019-04-30 ENCOUNTER — Inpatient Hospital Stay (HOSPITAL_COMMUNITY): Payer: Self-pay

## 2019-04-30 DIAGNOSIS — J181 Lobar pneumonia, unspecified organism: Secondary | ICD-10-CM

## 2019-04-30 LAB — SARS CORONAVIRUS 2 (TAT 6-24 HRS): SARS Coronavirus 2: NEGATIVE

## 2019-04-30 LAB — CULTURE, BLOOD (ROUTINE X 2)
Culture: NO GROWTH
Culture: NO GROWTH
Special Requests: ADEQUATE
Special Requests: ADEQUATE

## 2019-04-30 LAB — BASIC METABOLIC PANEL
Anion gap: 8 (ref 5–15)
BUN: 7 mg/dL — ABNORMAL LOW (ref 8–23)
CO2: 26 mmol/L (ref 22–32)
Calcium: 8.4 mg/dL — ABNORMAL LOW (ref 8.9–10.3)
Chloride: 108 mmol/L (ref 98–111)
Creatinine, Ser: 1.08 mg/dL — ABNORMAL HIGH (ref 0.44–1.00)
GFR calc Af Amer: >60 mL/min (ref >60)
GFR calc non Af Amer: 55 mL/min — ABNORMAL LOW (ref >60)
Glucose, Bld: 82 mg/dL (ref 70–99)
Potassium: 3.7 mmol/L (ref 3.5–5.1)
Sodium: 142 mmol/L (ref 135–145)

## 2019-04-30 LAB — CREATININE, SERUM
Creatinine, Ser: 1.21 mg/dL — ABNORMAL HIGH (ref 0.44–1.00)
GFR calc Af Amer: 56 mL/min — ABNORMAL LOW (ref >60)
GFR calc non Af Amer: 48 mL/min — ABNORMAL LOW (ref >60)

## 2019-04-30 MED ORDER — LEVOFLOXACIN IN D5W 750 MG/150ML IV SOLN
750.0000 mg | INTRAVENOUS | Status: DC
  Administered 2019-04-30: 750 mg via INTRAVENOUS
  Filled 2019-04-30 (×2): qty 150

## 2019-04-30 NOTE — Progress Notes (Signed)
PROGRESS NOTE    Christie Morrison   [id number]  DOB: Aug 28, 1955  DOA: 04/16/2019 PCP: Gildardo Pounds, NP   Brief Narrative:  [name] a 63 y.o.Fwith hx of alcoholic cirrhosis, ongoing alcohol use, chronic hypomagnesemia and hypokalemia, and alcohol-related seizure in May of this yearwho presents withseizure. In the ER, patient was tachycardic, blood pressure slightly elevated. Initially called "code stroke" because of aphasia, but this was subsequently canceled as it was thought to be due to postictal state. Sodium 133, potassium 2.9, magnesium 0.5, calcium 7.6, creatinine 1.6 (up from baseline 0.8), mild transaminitis, leukocytosis, macrocytic anemia. Anion gap 25, CT head unremarkable.  She was given IV magnesium, IV calcium, IV potassium, and started on Keppra. Neurology were consulted and the hospital service was asked to admit for severe electrolyte derangements and seizure.  Subjective: Ongoing severe cough with is productive. She feels short of breath as well.   Assessment & Plan:   Principal Problem:   Alcohol withdrawal seizure -This was the primary reason for the patient's admission -According to the history, the patient was noted to have a seizure at home which was witnessed by her son after she had reduce the amount of alcohol intake -Patient was evaluated by neurology in the hospital-EEG was negative -She went through alcohol withdrawal and now this has resolved -She has been counseled on the amount of her alcohol use and recommended to avoid alcohol altogether  Active Problems:    Sepsis due to UTI -present on admission -Patient was noted to have leukocytosis tachycardia and a UA suggestive of a UTI on 11/27 -Urine culture grew out Klebsiella pneumonia and based on sensitivities her antibiotics were narrowed -The patient was noted to have a fever spike of 103 on 12/5 -Extensive work-up was done-abdominal ultrasound was  suggestive of acute acalculous cholecystitis-general surgery was consulted and a HIDA scan was obtained which was found to be normal -The patient did have some diarrhea during her hospital stay but this was negative for infectious causes  and has resolved -- she was found to have trichomonas and this has been treated as well -ID has been following and assisting with determining etiology-WBC count continues to rise steadily however no other infectious source has been found -TEE was performed on 12-8 and found to be negative for endocarditis - 12/10> cough started yesterday and is worse today- I have ordered a CXR and a RLL infiltrate is noted- will add Levaquin- d/c Unasyn    Splenic infarcts -These were noted incidentally on CT abdomen pelvis on 12/5 -per radiology read> Multiple peripheral wedge-shaped areas of hypoattenuation in the spleen are noted. - Mentioned above, aTEE was performed and was negative for cardiac thrombus -There has been no noted A. fib during her hospital stay - will consult with Heme to help find a cause for both her splenic infarcts and ongoing leukocytosis which does not appear to be explained by any underlying infection.  Multiple electrolyte abnormalities including hypomagnesemia, hyponatremia, hypophosphatemia and hypokalemia -Nearly resolved-her magnesium level remains borderline and we are replacing this    Cocaine abuse -UDS was positive for cocaine and benzodiazepines (benzodiazepines were given by medical staff for seizure) -Has been counseled to discontinue use cocaine    Cirrhosis, alcoholic  -Ultrasound revealed nodular hepatic contour suggestive of cirrhosis -LFTs were initially elevated and have steadily trended down -CT of the abdomen and pelvis has shown small volume ascites as well -Hepatitis panel is noted to be negative -As mentioned  above, she has been counseled to avoid alcohol altogether to prevent further progression of cirrhosis       Prolonged QT interval   Macrocytic anemia    AKI versus CKD -Creatinine levels back in the spring were elevated and ranging from 1.2-1.78 -In the hospital during this admission she has ranged from 1.1-1.4/ -BUN she may have CKD stage III.    Time spent in minutes: 30 minutes in extensive review of chart DVT prophylaxis: Lovenox Code Status: Full code Family Communication:  Disposition Plan: Eventually will go home with home health PT and OT Consultants:   ID  Cardiology Procedures:   TEE Antimicrobials:  Anti-infectives (From admission, onward)   Start     Dose/Rate Route Frequency Ordered Stop   04/30/19 1430  levofloxacin (LEVAQUIN) IVPB 750 mg     750 mg 100 mL/hr over 90 Minutes Intravenous Every 24 hours 04/30/19 1424     04/26/19 2100  Ampicillin-Sulbactam (UNASYN) 3 g in sodium chloride 0.9 % 100 mL IVPB  Status:  Discontinued     3 g 200 mL/hr over 30 Minutes Intravenous Every 6 hours 04/26/19 1955 04/30/19 1433   04/26/19 2100  vancomycin (VANCOCIN) 1,250 mg in sodium chloride 0.9 % 250 mL IVPB  Status:  Discontinued     1,250 mg 166.7 mL/hr over 90 Minutes Intravenous Every 48 hours 04/26/19 2021 04/29/19 1437   04/24/19 1600  metroNIDAZOLE (FLAGYL) tablet 2,000 mg     2,000 mg Oral  Once 04/24/19 1531 04/24/19 1807   04/20/19 2030  piperacillin-tazobactam (ZOSYN) IVPB 3.375 g  Status:  Discontinued     3.375 g 12.5 mL/hr over 240 Minutes Intravenous Every 8 hours 04/20/19 1339 04/24/19 1313   04/20/19 1345  piperacillin-tazobactam (ZOSYN) IVPB 3.375 g     3.375 g 100 mL/hr over 30 Minutes Intravenous  Once 04/20/19 1339 04/20/19 1537   04/19/19 1500  cephALEXin (KEFLEX) capsule 500 mg  Status:  Discontinued     500 mg Oral Every 8 hours 04/19/19 1447 04/20/19 1335   04/17/19 1000  cefTRIAXone (ROCEPHIN) 1 g in sodium chloride 0.9 % 100 mL IVPB  Status:  Discontinued     1 g 200 mL/hr over 30 Minutes Intravenous Every 24 hours 04/17/19 0910 04/19/19 1447        Objective: Vitals:   04/29/19 0406 04/30/19 0352 04/30/19 0730 04/30/19 1217  BP:  134/79 (!) 141/81 (!) 147/75  Pulse:  89 84 86  Resp:  18 18 18   Temp:  98.4 F (36.9 C) 98.7 F (37.1 C) 99.4 F (37.4 C)  TempSrc:  Oral Oral Oral  SpO2:  97% 100% 100%  Weight: 52 kg     Height:        Intake/Output Summary (Last 24 hours) at 04/30/2019 1435 Last data filed at 04/29/2019 1525 Gross per 24 hour  Intake 334.1 ml  Output --  Net 334.1 ml   Filed Weights   04/22/19 0500 04/28/19 1118 04/29/19 0406  Weight: 59.1 kg 52.2 kg 52 kg    Examination: General exam: Appears comfortable  HEENT: PERRLA, oral mucosa moist, no sclera icterus or thrush Respiratory system: Crackles in RLL. Respiratory effort normal. Cardiovascular system: S1 & S2 heard,  No murmurs  Gastrointestinal system: Abdomen soft, non-tender, nondistended. Normal bowel sounds   Central nervous system: Alert and oriented. No focal neurological deficits. Extremities: No cyanosis, clubbing or edema Skin: No rashes or ulcers Psychiatry:  Mood & affect appropriate.   Data Reviewed: I  have personally reviewed following labs and imaging studies  CBC: Recent Labs  Lab 04/25/19 0303 04/26/19 0340 04/27/19 1004 04/28/19 0348 04/29/19 0410  WBC 25.3* 28.1* 28.4* 27.8* 35.1*  NEUTROABS 19.9* 24.4* 21.4* 21.8* 31.5*  HGB 7.7* 8.0* 7.9* 7.8* 8.2*  HCT 22.4* 24.2* 22.9* 21.6* 24.2*  MCV 100.0 101.7* 98.3 96.0 98.8  PLT 388 394 PLATELET CLUMPS NOTED ON SMEAR, UNABLE TO ESTIMATE [phone]   Basic Metabolic Panel: Recent Labs  Lab 04/25/19 0303 04/26/19 0340 04/27/19 1004 04/28/19 0348 04/29/19 0410 04/30/19 0900  NA 137 138 139 138 141  --   K 4.2 4.0 3.9 4.0 4.1  --   CL 102 106 105 105 105  --   CO2 25 24 23 24 22   --   GLUCOSE 83 122* 81 79 188*  --   BUN 10 8 6* 6* 10  --   CREATININE 1.26* 1.11* 1.11* 1.10* 1.34* 1.21*  CALCIUM 8.9 9.3 9.1 8.7* 8.2*  --   MG 1.9 1.6* 1.5* 1.9 1.6*  --   PHOS  2.4* 1.9* 4.3 5.4* 5.8*  --    GFR: Estimated Creatinine Clearance: 39.6 mL/min (A) (by C-G formula based on SCr of 1.21 mg/dL (H)). Liver Function Tests: Recent Labs  Lab 04/25/19 0303 04/26/19 0340 04/27/19 1004 04/28/19 0348 04/29/19 0410  AST 49* 43* 33 25 24  ALT 24 25 20 16 14   ALKPHOS 199* 210* 222* 178* 183*  BILITOT 0.7 0.6 0.9 0.7 0.7  PROT 6.4* 6.5 7.1 6.5 6.8  ALBUMIN 2.7* 2.8* 2.6* 2.4* 2.3*   Recent Labs  Lab 04/25/19 0303  LIPASE 22   No results for input(s): AMMONIA in the last 168 hours. Coagulation Profile: Recent Labs  Lab 04/27/19 1004  INR 1.3*   Cardiac Enzymes: No results for input(s): CKTOTAL, CKMB, CKMBINDEX, TROPONINI in the last 168 hours. BNP (last 3 results) No results for input(s): PROBNP in the last 8760 hours. HbA1C: No results for input(s): HGBA1C in the last 72 hours. CBG: Recent Labs  Lab 04/25/19 1705  GLUCAP 79   Lipid Profile: No results for input(s): CHOL, HDL, LDLCALC, TRIG, CHOLHDL, LDLDIRECT in the last 72 hours. Thyroid Function Tests: No results for input(s): TSH, T4TOTAL, FREET4, T3FREE, THYROIDAB in the last 72 hours. Anemia Panel: No results for input(s): VITAMINB12, FOLATE, FERRITIN, TIBC, IRON, RETICCTPCT in the last 72 hours. Urine analysis:    Component Value Date/Time   COLORURINE YELLOW 04/23/2019 1900   APPEARANCEUR HAZY (A) 04/23/2019 1900   LABSPEC 1.013 04/23/2019 1900   PHURINE 7.0 04/23/2019 1900   GLUCOSEU NEGATIVE 04/23/2019 1900   HGBUR NEGATIVE 04/23/2019 1900   BILIRUBINUR NEGATIVE 04/23/2019 1900   KETONESUR NEGATIVE 04/23/2019 1900   PROTEINUR NEGATIVE 04/23/2019 1900   NITRITE NEGATIVE 04/23/2019 1900   LEUKOCYTESUR LARGE (A) 04/23/2019 1900   Sepsis Labs: @LABRCNTIP (procalcitonin:4,lacticidven:4) ) Recent Results (from the past 240 hour(s))  Culture, blood (routine x 2)     Status: None   Collection Time: 04/23/19  8:37 AM   Specimen: BLOOD RIGHT ARM  Result Value Ref Range  Status   Specimen Description BLOOD RIGHT ARM  Final   Special Requests   Final    BOTTLES DRAWN AEROBIC ONLY Blood Culture results may not be optimal due to an inadequate volume of blood received in culture bottles   Culture   Final    NO GROWTH 5 DAYS Performed at [facility], [place] [address] [place], [place] [address]  Report Status 04/28/2019 FINAL  Final  Culture, blood (routine x 2)     Status: None   Collection Time: 04/23/19  8:46 AM   Specimen: BLOOD RIGHT HAND  Result Value Ref Range Status   Specimen Description BLOOD RIGHT HAND  Final   Special Requests   Final    BOTTLES DRAWN AEROBIC ONLY Blood Culture results may not be optimal due to an inadequate volume of blood received in culture bottles   Culture   Final    NO GROWTH 5 DAYS Performed at [facility], [place] [address] [place], [place] [address]    Report Status 04/28/2019 FINAL  Final  Culture, Urine     Status: None   Collection Time: 04/23/19 12:52 PM   Specimen: Urine, Random  Result Value Ref Range Status   Specimen Description URINE, RANDOM  Final   Special Requests NONE  Final   Culture   Final    NO GROWTH Performed at [facility], [place] [address], [place], [place] [address]    Report Status 04/24/2019 FINAL  Final  Culture, Urine     Status: None   Collection Time: 04/23/19  4:33 PM   Specimen: Urine, Random  Result Value Ref Range Status   Specimen Description URINE, RANDOM  Final   Special Requests NONE  Final   Culture   Final    NO GROWTH Performed at [facility], [place] [address], [place], [place] [address]    Report Status 04/24/2019 FINAL  Final  Culture, blood (routine x 2)     Status: None   Collection Time: 04/25/19  3:00 PM   Specimen: BLOOD RIGHT HAND  Result Value Ref Range Status   Specimen Description BLOOD RIGHT HAND  Final   Special Requests AEROBIC BOTTLE ONLY Blood Culture adequate volume  Final   Culture   Final    NO GROWTH 5 DAYS Performed at  [facility], [place] [address] [place], [place] [address]    Report Status 04/30/2019 FINAL  Final  Culture, blood (routine x 2)     Status: None   Collection Time: 04/25/19  3:04 PM   Specimen: BLOOD RIGHT WRIST  Result Value Ref Range Status   Specimen Description BLOOD RIGHT WRIST  Final   Special Requests AEROBIC BOTTLE ONLY Blood Culture adequate volume  Final   Culture   Final    NO GROWTH 5 DAYS Performed at [facility], [place] [address] [place], [place] [address]    Report Status 04/30/2019 FINAL  Final  MRSA PCR Screening     Status: None   Collection Time: 04/27/19  1:37 PM   Specimen: Nasal Mucosa; Nasopharyngeal  Result Value Ref Range Status   MRSA by PCR NEGATIVE NEGATIVE Final    Comment:        The GeneXpert MRSA Assay (FDA approved for NASAL specimens only), is one component of a comprehensive MRSA colonization surveillance program. It is not intended to diagnose MRSA infection nor to guide or monitor treatment for MRSA infections. Performed at [facility], [place] [address], [place], [place] [address]          Radiology Studies: DG Chest 2 View  Result Date: 04/30/2019 CLINICAL DATA:  Cough. EXAM: CHEST - 2 VIEW COMPARISON:  04/23/2019. FINDINGS: Mediastinum and hilar structures normal. Heart size normal. Right base infiltrate. Small right pleural effusion. Mild right base infiltrate consistent with pneumonia. Small right pleural  effusion. No pneumothorax. No acute bony abnormality. IMPRESSION: Mild right base infiltrate consistent with pneumonia. Electronically Signed   By: [place]   On: 04/30/2019 11:05      Scheduled Meds: . enoxaparin (LOVENOX) injection  40 mg Subcutaneous Q24H  . folic acid  1 mg Oral Daily  . magnesium oxide  400 mg Oral Daily  . multivitamin with minerals  1 tablet Oral Daily  . potassium chloride SA  20 mEq Oral Daily  . thiamine  100 mg Oral Daily   Or  . thiamine  100 mg Intravenous Daily    Continuous Infusions: . sodium chloride Stopped (04/29/19 0338)  . levofloxacin (LEVAQUIN) IV       LOS: 13 days      Debbe Odea, MD [facility] Pager: [email or web address] Password [email or web address] 04/30/2019, 2:35 PM

## 2019-04-30 NOTE — Progress Notes (Signed)
Physical Therapy Treatment Patient Details Name: Christie Morrison MRN: [id number] DOB: 1955/12/18 Today's Date: 04/30/2019    History of Present Illness 63yo female presenting with seizure, confusion and disorientation. CT negative for acute CVA and code stroke ultimately cancelled. Admitted for seizure like activity in setting of severe electrolyte derangement. PMH cocaine use, ROH abuse, HLD, seizure    PT Comments    Pt performed limited activity including gt training without device.  Pt required cues for reciprocal armswing to improve balance.  Pt continues to present with drifting but able to self right.  Plan next session for progression to stair training and standing exercises.     Follow Up Recommendations  Home health PT;Supervision/Assistance - 24 hour     Equipment Recommendations  None recommended by PT    Recommendations for Other Services       Precautions / Restrictions Precautions Precautions: Fall Restrictions Weight Bearing Restrictions: No    Mobility  Bed Mobility Overal bed mobility: Independent                Transfers Overall transfer level: Needs assistance Equipment used: None Transfers: Sit to/from Stand Sit to Stand: Supervision         General transfer comment: Cues for hand placement. Close supervision for safety.  Ambulation/Gait Ambulation/Gait assistance: Supervision Gait Distance (Feet): 120 Feet Assistive device: Rolling walker (2 wheeled) Gait Pattern/deviations: Step-through pattern;Drifts right/left;Narrow base of support;Staggering right Gait velocity: decreased   General Gait Details: Pt refused use of RW but mild improvement noted in balance.  Cues for reciprocal armswing.  Pt required cues for righting.  Less drifting noted but did occur but patient able to self correct.   Stairs             Wheelchair Mobility    Modified Rankin (Stroke Patients Only)       Balance Overall balance assessment: Needs  assistance Sitting-balance support: Feet supported Sitting balance-Leahy Scale: Normal     Standing balance support: Single extremity supported;During functional activity Standing balance-Leahy Scale: Fair                              Cognition Arousal/Alertness: Awake/alert Behavior During Therapy: WFL for tasks assessed/performed Overall Cognitive Status: No family/caregiver present to determine baseline cognitive functioning                                 General Comments: Pt required encouragement to participate in gt training.  She continues to self limit activity due to pain in abdomen.      Exercises      General Comments        Pertinent Vitals/Pain Pain Assessment: No/denies pain Faces Pain Scale: Hurts little more Pain Location: L upper quadrant of abdomen Pain Descriptors / Indicators: Aching Pain Intervention(s): Monitored during session;Repositioned    Home Living                      Prior Function            PT Goals (current goals can now be found in the care plan section) Acute Rehab PT Goals Patient Stated Goal: to go back to work  Progress towards PT goals: Progressing toward goals    Frequency    Min 3X/week      PT Plan Current plan remains appropriate    Co-evaluation  AM-PAC PT "6 Clicks" Mobility   Outcome Measure  Help needed turning from your back to your side while in a flat bed without using bedrails?: None Help needed moving from lying on your back to sitting on the side of a flat bed without using bedrails?: None Help needed moving to and from a bed to a chair (including a wheelchair)?: None Help needed standing up from a chair using your arms (e.g., wheelchair or bedside chair)?: A Little Help needed to walk in hospital room?: A Little Help needed climbing 3-5 steps with a railing? : A Little 6 Click Score: 21    End of Session Equipment Utilized During Treatment:  Gait belt Activity Tolerance: Patient limited by fatigue Patient left: in bed;with call bell/phone within reach Nurse Communication: Mobility status PT Visit Diagnosis: Unsteadiness on feet (R26.81);Difficulty in walking, not elsewhere classified (R26.2);Other abnormalities of gait and mobility (R26.89);Muscle weakness (generalized) (M62.81)     Time: [id number] PT Time Calculation (min) (ACUTE ONLY): 16 min  Charges:  $Gait Training: 8-22 mins                     Christie Morrison , PTA Acute Rehabilitation Services Pager [phone] Office [phone]     Christie Morrison 04/30/2019, 2:25 PM

## 2019-05-01 DIAGNOSIS — A419 Sepsis, unspecified organism: Secondary | ICD-10-CM

## 2019-05-01 DIAGNOSIS — F10920 Alcohol use, unspecified with intoxication, uncomplicated: Secondary | ICD-10-CM

## 2019-05-01 DIAGNOSIS — J181 Lobar pneumonia, unspecified organism: Secondary | ICD-10-CM

## 2019-05-01 LAB — CBC
HCT: 24.2 % — ABNORMAL LOW (ref 36.0–46.0)
Hemoglobin: 8.4 g/dL — ABNORMAL LOW (ref 12.0–15.0)
MCH: 33.7 pg (ref 26.0–34.0)
MCHC: 34.7 g/dL (ref 30.0–36.0)
MCV: 97.2 fL (ref 80.0–100.0)
Platelets: 304 10*3/uL (ref 150–400)
RBC: 2.49 MIL/uL — ABNORMAL LOW (ref 3.87–5.11)
RDW: 16.9 % — ABNORMAL HIGH (ref 11.5–15.5)
WBC: 34.1 10*3/uL — ABNORMAL HIGH (ref 4.0–10.5)
nRBC: 0 % (ref 0.0–0.2)

## 2019-05-01 LAB — BASIC METABOLIC PANEL
Anion gap: 12 (ref 5–15)
BUN: 5 mg/dL — ABNORMAL LOW (ref 8–23)
CO2: 23 mmol/L (ref 22–32)
Calcium: 8.6 mg/dL — ABNORMAL LOW (ref 8.9–10.3)
Chloride: 104 mmol/L (ref 98–111)
Creatinine, Ser: 1.08 mg/dL — ABNORMAL HIGH (ref 0.44–1.00)
GFR calc Af Amer: >60 mL/min (ref >60)
GFR calc non Af Amer: 55 mL/min — ABNORMAL LOW (ref >60)
Glucose, Bld: 113 mg/dL — ABNORMAL HIGH (ref 70–99)
Potassium: 4.1 mmol/L (ref 3.5–5.1)
Sodium: 139 mmol/L (ref 135–145)

## 2019-05-01 MED ORDER — THIAMINE HCL 100 MG PO TABS: 100.0000 mg | ORAL_TABLET | Freq: Every day | ORAL | 0 refills | Status: DC

## 2019-05-01 MED ORDER — MAGNESIUM OXIDE -MG SUPPLEMENT 400 (240 MG) MG PO TABS: 1.0000 | ORAL_TABLET | Freq: Every day | ORAL | 0 refills | Status: DC

## 2019-05-01 MED ORDER — POTASSIUM CHLORIDE CRYS ER 20 MEQ PO TBCR: 20.0000 meq | EXTENDED_RELEASE_TABLET | Freq: Every day | ORAL | 0 refills | Status: DC

## 2019-05-01 MED ORDER — LEVOFLOXACIN 750 MG PO TABS: 750.0000 mg | ORAL_TABLET | Freq: Every day | ORAL | 0 refills | Status: DC

## 2019-05-01 MED ORDER — LEVOFLOXACIN 750 MG PO TABS: 750.0000 mg | ORAL_TABLET | Freq: Every day | ORAL | 0 refills | Status: AC

## 2019-05-01 MED ORDER — DM-GUAIFENESIN ER 30-600 MG PO TB12: 1.0000 | ORAL_TABLET | Freq: Two times a day (BID) | ORAL | 0 refills | Status: DC | PRN

## 2019-05-01 MED ORDER — ADULT MULTIVITAMIN W/MINERALS CH: 1.0000 | ORAL_TABLET | Freq: Every day | ORAL | 0 refills | Status: DC

## 2019-05-01 MED FILL — MAGNESIUM OXIDE 400 MG TAB: 400 | 30 days supply | Qty: 30 | Fill #0

## 2019-05-01 MED FILL — POTASSIUM CL ER 20 MEQ TAB: 20 | 30 days supply | Qty: 30 | Fill #0

## 2019-05-01 MED FILL — levoFLOXacin 750 MG TABS: 750 | 7 days supply | Qty: 7 | Fill #0

## 2019-05-01 NOTE — Progress Notes (Signed)
Pt has left the room & none of the staff is aware of when the pt left. RN went to give pt information to help with her meds once d/c'd but pt already gone as well as her equipment.

## 2019-05-01 NOTE — TOC Progression Note (Signed)
Transition of Care [facility]) - Progression Note    Patient Details  Name: Christie Morrison MRN: [id number] Date of Birth: 1955/09/17  Transition of Care [facility]) CM/SW [place], [place] Phone Number: 05/01/2019, 1:43 PM  Clinical Narrative:     CSW consulted for transportation. Patient states she has a bed side commode and walker and was unable to carry items with her on the bus. Patient states no other means of transportation. CSW provided RN with taxi voucher.   Thurmond Butts, MSW, [facility] Clinical Social Worker [phone]   Expected Discharge Plan: [place] Barriers to Discharge: Barriers Resolved  Expected Discharge Plan and Services Expected Discharge Plan: [place]   Discharge Planning Services: CM Consult Post Acute Care Choice: Durable Medical Equipment, Home Health   Expected Discharge Date: 05/01/19               DME Arranged: Gilford Rile rolling, 3-N-1 DME Agency: [employer] Date DME Agency Contacted: 05/01/19 Time DME Agency Contacted: 0930 Representative spoke with at DME Agency: Bertrum Sol HH Arranged: PT, OT [place] Agency: [facility] Date [place]: 05/01/19 Time [place]: [place] Representative spoke with at [place]: Cassie   Social Determinants of Health ([place]) Interventions    Readmission Risk Interventions Readmission Risk Prevention Plan 10/16/2018  Transportation Screening Complete  PCP or Specialist Appt within 3-5 Days (No Data)  Palliative Care Screening Not Applicable  Medication Review (RN Care Manager) Referral to Pharmacy  Some recent data might be hidden

## 2019-05-01 NOTE — Progress Notes (Signed)
Discharge instructions & educated provided to pt. Including but not limited to diet, f/u appointments, meds, incentive spirometer & etc. All questions asked. Education verified via teach back method. 3 in 1 & walker at bedside. IV & telemetry removed.

## 2019-05-01 NOTE — Discharge Summary (Signed)
Physician Discharge Summary  HAVA MILLWEE [id number] DOB: 11-06-1955 DOA: 04/16/2019  PCP: Gildardo Pounds, NP  Admit date: 04/16/2019 Discharge date: 05/01/2019  Admitted From: home Disposition:  home   Recommendations for Outpatient Follow-up:  1. Continue to encourage abstinence from non- prescribed drugs and alcohol 2. Bmet (to check electrolytes) and CBC in 1 wk- if WBC remain elevated, hematology consult will be needed  Home Health:  ordered  Discharge Condition:  stable   CODE STATUS:  Full code   Diet recommendation:  Heart healthy  Consultants:   ID  Cardiology      Discharge Diagnoses:  Principal Problem:   Alcohol withdrawal seizure ([place]) Active Problems:   Splenic infarct   Acute alcohol intoxication ([place])   Cirrhosis, alcoholic ([place])   Klebsiella infection   Sepsis ([place])   Lobar pneumonia ([place])   AKI (acute kidney injury) ([place])   Cocaine abuse ([place])   Hypokalemia   Hypocalcemia   Hypomagnesemia   Macrocytic anemia   Hyponatremia       Brief Summary: [name] a 63 y.o.Fwith hx of alcoholic cirrhosis, ongoing alcohol use, chronic hypomagnesemia and hypokalemia, and alcohol-related seizure in May of this yearwho presents withseizure. In the ER, patient was tachycardic, blood pressure slightly elevated. Initially called "code stroke" because of aphasia, but this was subsequently canceled as it was suspected to be due to postictal state. Sodium 133, potassium 2.9, magnesium 0.5, calcium 7.6, creatinine 1.6 (up from baseline 0.8), mild transaminitis, leukocytosis, macrocytic anemia. Anion gap 25, CT head unremarkable.  She was given IV magnesium, IV calcium, IV potassium, and started on Keppra. Neurology was consulted and the hospital service was asked to admit for severe electrolyte derangements and seizure.  Hospital Course:  Principal Problem:   Alcohol withdrawal seizure -This was the primary reason for  the patient's admission -According to the history, the patient was noted to have a seizure at home which was witnessed by her son after she had reduce the amount of alcohol intake -Patient was evaluated by neurology in the hospital -EEG was negative -She continued to be treated for alcohol withdrawal which eventually resolved -She has been counseled and recommended to avoid alcohol altogether  Active Problems:    Sepsis due to UTI and Lobar pneumonia- UTI/ Sepsis present on admission -Patient was noted to have leukocytosis tachycardia and a UA suggestive of a UTI on 11/27 -Urine culture grew out Klebsiella pneumonia and based on sensitivities her antibiotics were narrowed -The patient was noted to have a fever spike of 103 on 12/5 -Extensive work-up was done-abdominal ultrasound was suggestive of acute acalculous cholecystitis-general surgery was consulted and a HIDA scan was obtained which was found to be normal -The patient did have some diarrhea during her hospital stay but this was negative for infectious causes  and has resolved -- she was found to have trichomonas and this has been treated as well -ID has been following and assisting with determining etiology-WBC count continued to rise -TEE was performed on 12-8 and found to be negative for endocarditis- ID recommended to continue Unasyn 12/9- hematology consulted for rising WBC count (and splenic infarcts) and Dr Alen Blew feels that if her count does not improved, she may need outpt work up for a myeloproliferative neoplasm - 12/9> she developed a cough that progressed and became worse-  a CXR on 12/10 revealed a faint RLL infiltrate and thus Unasyn was transitioned to Levaquin- her cough is notably improved- she is  not hypoxic nor does she have dyspnea when ambulating- she is asking to go home and I do feel she is stable to    Splenic infarcts -These were noted incidentally on CT abdomen pelvis on 12/5 -per radiology read> Multiple  peripheral wedge-shaped areas of hypoattenuation in the spleen are noted. - Mentioned above, aTEE was performed and was negative for cardiac thrombus -There has been no noted A. fib during her hospital stay - hematology opinion was sought- no anticoagulation recommended- Dr Alen Blew notes that she "may need additional work up for malignancy...may need hypercoagulable work up" thus she will need to return to hematology as oupt  Multiple electrolyte abnormalities including hypomagnesemia, hyponatremia, hypophosphatemia and hypokalemia -  resolved- she is on electrolyte replacement as outpt which is being continued    AKI versus CKD -Creatinine levels back in the spring were elevated and ranging from 1.2-1.78 -In the hospital during this admission she has ranged from 1.08-1.4- no reversible cause for continued Cr elevation found, thus she may have CKD stage III.    Cocaine abuse -UDS was positive for cocaine and benzodiazepines (benzodiazepines were given by medical staff for seizure) -Has been counseled to discontinue use cocaine    Cirrhosis, alcoholic  -Ultrasound revealed nodular hepatic contour suggestive of cirrhosis -LFTs were initially elevated and have steadily trended down -CT of the abdomen and pelvis has shown small volume ascites as well -Hepatitis panel is noted to be negative -As mentioned above, she has been counseled to avoid alcohol altogether to prevent further progression of cirrhosis      Macrocytic anemia - likely related to ETOH abuse- normal Folate and B12 levels   Discharge Exam: Vitals:   05/01/19 0311 05/01/19 0812  BP: 123/66 (!) 152/90  Pulse: 97 (!) 101  Resp: 16 18  Temp: 99 F (37.2 C) 99.7 F (37.6 C)  SpO2: 100% 100%   Vitals:   04/30/19 2009 05/01/19 0001 05/01/19 0311 05/01/19 0812  BP: (!) 159/94 139/73 123/66 (!) 152/90  Pulse: 89 86 97 (!) 101  Resp: 18 16 16 18   Temp: 99.5 F (37.5 C) 99 F (37.2 C) 99 F (37.2 C) 99.7 F (37.6 C)   TempSrc: Oral Oral Oral Oral  SpO2: 96% 100% 100% 100%  Weight:   50 kg   Height:        General: Pt is alert, awake, not in acute distress Cardiovascular: RRR, S1/S2 +, no rubs, no gallops Respiratory: CTA bilaterally, no wheezing, no rhonchi Abdominal: Soft, NT, ND, bowel sounds + Extremities: no edema, no cyanosis   Discharge Instructions  Discharge Instructions    Diet - low sodium heart healthy   Complete by: As directed    Increase activity slowly   Complete by: As directed      Allergies as of 05/01/2019   No Known Allergies     Medication List    TAKE these medications   dextromethorphan-guaiFENesin 30-600 MG 12hr tablet Commonly known as: MUCINEX DM Take 1 tablet by mouth 2 (two) times daily as needed for cough.   levofloxacin 750 MG tablet Commonly known as: Levaquin Take 1 tablet (750 mg total) by mouth daily for 7 days.   Magnesium Oxide 400 (240 Mg) MG Tabs Take 1 tablet (400 mg total) by mouth daily.   multivitamin with minerals Tabs tablet Take 1 tablet by mouth daily.   potassium chloride SA 20 MEQ tablet Commonly known as: KLOR-CON Take 1 tablet (20 mEq total) by mouth daily.  thiamine 100 MG tablet Take 1 tablet (100 mg total) by mouth daily.            Durable Medical Equipment  (From admission, onward)         Start     Ordered   04/21/19 0532  For home use only DME Walker rolling  Once    Comments: 5" wheels  Question:  Patient needs a walker to treat with the following condition  Answer:  Ambulatory dysfunction   04/21/19 0531   04/18/19 1540  For home use only DME Tub bench  Once     04/18/19 1539         Follow-up Information    [facility] Follow up on 05/19/2019.   Why: Your appointment is at 2:10 pm.  Contact information: [address] [place] [phone]         No Known Allergies   Procedures/Studies:    EEG  Result Date:  04/16/2019 Lora Havens, MD     04/16/2019 11:41 AM Patient Name: SHANIAH GHOSH MRN: [id number] Epilepsy Attending: Lora Havens Referring Physician/Provider: Dr Remi Haggard [place] Date: 04/16/2019 Duration: 24.27mins Patient history: 63 year old female with history of seizure disorder and substance abuse presented with a seizure.  EEG to evaluate for seizures. Level of alertness: Awake, sleep AEDs during EEG study: [place] Technical aspects: This EEG study was done with scalp electrodes positioned according to the 10-20 International system of electrode placement. Electrical activity was acquired at a sampling rate of 500Hz  and reviewed with a high frequency filter of 70Hz  and a low frequency filter of 1Hz . EEG data were recorded continuously and digitally stored. Description: During awake state,the posterior dominant rhythm consists of 8-9 Hz activity of moderate voltage (25-35 uV) seen predominantly in posterior head regions, symmetric and reactive to eye opening and eye closing.  Sleep was characterized by vertex waves, sleep spindles (12 to 14 Hz), maximal frontocentral and generalized 5 to 6 Hz theta slowing.Marland Kitchen Hyperventilation and photic stimulation were not performed. IMPRESSION: This study is within normal limits. No seizures or epileptiform discharges were seen throughout the recording. Lora Havens   DG Chest 2 View  Result Date: 04/30/2019 CLINICAL DATA:  Cough. EXAM: CHEST - 2 VIEW COMPARISON:  04/23/2019. FINDINGS: Mediastinum and hilar structures normal. Heart size normal. Right base infiltrate. Small right pleural effusion. Mild right base infiltrate consistent with pneumonia. Small right pleural effusion. No pneumothorax. No acute bony abnormality. IMPRESSION: Mild right base infiltrate consistent with pneumonia. Electronically Signed   By: Marcello Moores  Register   On: 04/30/2019 11:05   CT CHEST W CONTRAST  Result Date: 04/25/2019 CLINICAL DATA:  Acute pain, dyspnea. EXAM: CT CHEST,  ABDOMEN AND PELVIS WITHOUT CONTRAST TECHNIQUE: Multidetector CT imaging of the chest, abdomen and pelvis was performed following the standard protocol without IV contrast. COMPARISON:  Chest radiograph dated 04/23/2019 FINDINGS: CT CHEST FINDINGS Cardiovascular: Vascular calcifications are seen in the aortic arch. Normal heart size. No pericardial effusion. Mediastinum/Nodes: No enlarged mediastinal, hilar, or axillary lymph nodes. Thyroid gland, trachea, and esophagus demonstrate no significant findings. Lungs/Pleura: There is a small right pleural effusion with associated atelectasis. There is a trace left pleural effusion with associated atelectasis. There is no pneumothorax. Musculoskeletal: No chest wall mass or suspicious bone lesions identified. CT ABDOMEN PELVIS FINDINGS Hepatobiliary: No focal liver abnormality is seen. There is gallbladder wall edema and thickening, measuring 8 mm. Radiodense material in the gallbladder is favored  to reflect vicarious excretion of contrast. No biliary dilatation. Pancreas: Unremarkable. No pancreatic ductal dilatation or surrounding inflammatory changes. Spleen: Multiple peripheral wedge-shaped areas of hypoattenuation in the spleen are noted. Adrenals/Urinary Tract: Adrenal glands are unremarkable. Kidneys are normal, without renal calculi, focal lesion, or hydronephrosis. Bladder is unremarkable. Stomach/Bowel: Stomach is within normal limits. Appendix appears normal. No evidence of bowel wall thickening, distention, or inflammatory changes. Enteric contrast reaches the rectum. Vascular/Lymphatic: No significant vascular findings are present. No enlarged abdominal or pelvic lymph nodes. Reproductive: Uterus and bilateral adnexa are unremarkable. Other: There is moderate volume ascites. No abdominal wall hernia is identified. Musculoskeletal: No acute or significant osseous findings. IMPRESSION: 1. Small right pleural effusion with associated atelectasis. 2. Gallbladder  wall edema and thickening is nonspecific but can be seen in the setting of acute cholecystitis. 3. 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 4. Moderate volume ascites. Aortic Atherosclerosis (ICD10-I70.0). These results were called by telephone at the time of interpretation on 04/25/2019 at 8:38 pm to provider [facility] NP, who verbally acknowledged these results. Electronically Signed   By: Zerita Boers M.D.   On: 04/25/2019 20:42   NM Hepatobiliary Liver Func  Result Date: 04/27/2019 CLINICAL DATA:  Right upper quadrant pain, fever, leukocytosis positive Murphy sign EXAM: NUCLEAR MEDICINE HEPATOBILIARY IMAGING TECHNIQUE: Sequential images of the abdomen were obtained out to 60 minutes following intravenous administration of radiopharmaceutical. RADIOPHARMACEUTICALS:  5.0 mCi Tc-3m  Choletec IV COMPARISON:  Ultrasound 04/26/2019, CT abdomen pelvis 04/25/2019, HIDA with gallbladder ejection fraction 04/21/2019 FINDINGS: Prompt uptake and biliary excretion of activity by the liver is seen. Gallbladder activity is visualized, consistent with patency of cystic duct. Biliary activity passes into small bowel, consistent with patent common bile duct. IMPRESSION: No scintigraphic evidence to suggest obstruction of the common bile duct, cystic duct or features of acute cholecystitis. Electronically Signed   By: Lovena Le M.D.   On: 04/27/2019 17:50   US Abdomen Complete  Result Date: 04/20/2019 CLINICAL DATA:  Cirrhosis EXAM: ABDOMEN ULTRASOUND COMPLETE COMPARISON:  None. FINDINGS: Gallbladder: Multiple layering gallstones, including a 12 mm stone in the gallbladder neck. Associated gallbladder wall thickening/pericholecystic fluid. Common bile duct: Diameter: 4 mm Liver: No focal lesion identified. Within normal limits in parenchymal echogenicity. Portal vein is patent on color Doppler imaging with normal direction of blood flow towards the liver.  IVC: No abnormality visualized. Pancreas: Visualized portion unremarkable. Spleen: Size and appearance within normal limits. Right Kidney: Length: 12.8 cm. Echogenicity within normal limits. No mass or hydronephrosis visualized. Left Kidney: Length: 11.5 cm. Echogenicity within normal limits. No mass or hydronephrosis visualized. Abdominal aorta: No aneurysm visualized. Other findings: None. IMPRESSION: Cholelithiasis with gallbladder wall thickening/pericholecystic fluid, concerning for acute cholecystitis. Correlate for right upper quadrant pain. If clinically warranted, consider hepatobiliary nuclear medicine scan for confirmation. Mildly nodular hepatic contour, suggesting cirrhosis. No focal hepatic lesion is seen. These results will be called to the ordering clinician or representative by the Radiologist Assistant, and communication documented in the PACS or zVision Dashboard. Electronically Signed   By: Julian Hy M.D.   On: 04/20/2019 20:59   CT ABDOMEN PELVIS W CONTRAST  Result Date: 04/25/2019 CLINICAL DATA:  Acute pain, dyspnea. EXAM: CT CHEST, ABDOMEN AND PELVIS WITHOUT CONTRAST TECHNIQUE: Multidetector CT imaging of the chest, abdomen and pelvis was performed following the standard protocol without IV contrast. COMPARISON:  Chest radiograph dated 04/23/2019 FINDINGS: CT CHEST FINDINGS Cardiovascular: Vascular calcifications are seen in the aortic  arch. Normal heart size. No pericardial effusion. Mediastinum/Nodes: No enlarged mediastinal, hilar, or axillary lymph nodes. Thyroid gland, trachea, and esophagus demonstrate no significant findings. Lungs/Pleura: There is a small right pleural effusion with associated atelectasis. There is a trace left pleural effusion with associated atelectasis. There is no pneumothorax. Musculoskeletal: No chest wall mass or suspicious bone lesions identified. CT ABDOMEN PELVIS FINDINGS Hepatobiliary: No focal liver abnormality is seen. There is gallbladder wall  edema and thickening, measuring 8 mm. Radiodense material in the gallbladder is favored to reflect vicarious excretion of contrast. No biliary dilatation. Pancreas: Unremarkable. No pancreatic ductal dilatation or surrounding inflammatory changes. Spleen: Multiple peripheral wedge-shaped areas of hypoattenuation in the spleen are noted. Adrenals/Urinary Tract: Adrenal glands are unremarkable. Kidneys are normal, without renal calculi, focal lesion, or hydronephrosis. Bladder is unremarkable. Stomach/Bowel: Stomach is within normal limits. Appendix appears normal. No evidence of bowel wall thickening, distention, or inflammatory changes. Enteric contrast reaches the rectum. Vascular/Lymphatic: No significant vascular findings are present. No enlarged abdominal or pelvic lymph nodes. Reproductive: Uterus and bilateral adnexa are unremarkable. Other: There is moderate volume ascites. No abdominal wall hernia is identified. Musculoskeletal: No acute or significant osseous findings. IMPRESSION: 1. Small right pleural effusion with associated atelectasis. 2. Gallbladder wall edema and thickening is nonspecific but can be seen in the setting of acute cholecystitis. 3. 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 4. Moderate volume ascites. Aortic Atherosclerosis (ICD10-I70.0). These results were called by telephone at the time of interpretation on 04/25/2019 at 8:38 pm to provider [facility] NP, who verbally acknowledged these results. Electronically Signed   By: Zerita Boers M.D.   On: 04/25/2019 20:42   NM Hepato W/EjeCT Fract  Result Date: 04/21/2019 CLINICAL DATA:  Known cholelithiasis.  History of cirrhosis. EXAM: NUCLEAR MEDICINE HEPATOBILIARY IMAGING WITH GALLBLADDER EF TECHNIQUE: Sequential images of the abdomen were obtained out to 60 minutes following intravenous administration of radiopharmaceutical. After oral ingestion of Ensure, gallbladder  ejection fraction was determined. At 60 min, normal ejection fraction is greater than 33%. RADIOPHARMACEUTICALS:  5.0 mCi Tc-30m  Choletec IV COMPARISON:  Abdomen ultrasound dated 04/20/2019. FINDINGS: Prompt uptake and biliary excretion of activity by the liver is seen. Gallbladder activity is visualized, consistent with patency of cystic duct. Biliary activity passes into small bowel, consistent with patent common bile duct. Calculated gallbladder ejection fraction is 72%. (Normal gallbladder ejection fraction with Ensure is greater than 33%.) IMPRESSION: Normal hepatobiliary imaging with normal gallbladder function. Electronically Signed   By: Lajean Manes M.D.   On: 04/21/2019 13:53   US Abdomen Limited  Result Date: 04/26/2019 CLINICAL DATA:  Acute pain. CT demonstrates right pleural effusion, gallbladder wall thickening, abdominal ascites EXAM: ULTRASOUND ABDOMEN LIMITED RIGHT UPPER QUADRANT COMPARISON:  CT from earlier the same day FINDINGS: Gallbladder: Incompletely distended. Wall thickening up to 1.3 cm. Innumerable subcentimeter layering calculi in the dependent aspect. Small amount of pericholecystic fluid. Sonographer describes no sonographic Murphy's sign. Common bile duct: Not demonstrated Liver: Limited visualization. No discrete lesion or biliary ductal dilatation identified. Other: Small volume perihepatic ascites. 4-quadrant survey shows only scattered small volume abdominal ascites. No adequate pocket for safe percutaneous paracentesis. IMPRESSION: 1. Cholelithiasis and gallbladder wall thickening, without sonographic Murphy's sign. 2. Small volume abdominal ascites.  Paracentesis deferred. Electronically Signed   By: Lucrezia Europe M.D.   On: 04/26/2019 13:21   DG CHEST PORT 1 VIEW  Result Date: 04/23/2019 CLINICAL DATA:  Leukocytosis.  Generalized weakness. EXAM: PORTABLE  CHEST 1 VIEW COMPARISON:  04/17/2019 FINDINGS: Lungs are adequately inflated and otherwise clear. Cardiomediastinal  silhouette and remainder of the exam is unchanged. IMPRESSION: No active disease. Electronically Signed   By: Marin Olp M.D.   On: 04/23/2019 09:12   DG CHEST PORT 1 VIEW  Result Date: 04/17/2019 CLINICAL DATA:  Leukocytosis. EXAM: PORTABLE CHEST 1 VIEW COMPARISON:  10/15/2018 FINDINGS: The cardiomediastinal silhouette is within normal limits. No airspace consolidation, edema, pleural effusion, pneumothorax is identified. No acute osseous abnormality is seen. IMPRESSION: No active disease. Electronically Signed   By: Logan Bores M.D.   On: 04/17/2019 06:14   ECHOCARDIOGRAM COMPLETE  Result Date: 04/24/2019   ECHOCARDIOGRAM REPORT   Patient Name:   DAMIYA GUIZA Date of Exam: 04/23/2019 Medical Rec #:  [id number]        Height:       70.0 in Accession #:    [id number]       Weight:       130.3 lb Date of Birth:  [birth year]        BSA:          1.74 m Patient Age:    49 years         BP:           91/50 mmHg Patient Gender: F                HR:           80 bpm. Exam Location:  Inpatient Procedure: 2D Echo Indications:    Dyspnea 786.09/R06.00  History:        Patient has prior history of Echocardiogram examinations, most                 recent 10/16/2018. Sepsis. Polysubstance abuse.  Sonographer:    Clayton Lefort RDCS (AE) Referring Phys: [id number] Georgina Quint LATIF [place]  1. Left ventricular ejection fraction, by visual estimation, is 55 to 60%. The left ventricle has normal function. There is moderately increased left ventricular hypertrophy.  2. Left ventricular diastolic parameters are indeterminate.  3. Global right ventricle has normal systolic function.The right ventricular size is normal. No increase in right ventricular wall thickness.  4. Left atrial size was normal.  5. Right atrial size was normal.  6. The mitral valve is grossly normal. Mild mitral valve regurgitation.  7. The tricuspid valve is normal in structure. Tricuspid valve regurgitation is trivial.  8. The aortic valve is normal  in structure. Aortic valve regurgitation is not visualized. No evidence of aortic valve sclerosis or stenosis.  9. The pulmonic valve was normal in structure. Pulmonic valve regurgitation is not visualized. 10. Mildly elevated pulmonary artery systolic pressure. 11. The atrial septum is grossly normal. FINDINGS  Left Ventricle: Left ventricular ejection fraction, by visual estimation, is 55 to 60%. The left ventricle has normal function. There is moderately increased left ventricular hypertrophy. Left ventricular diastolic parameters are indeterminate. Right Ventricle: The right ventricular size is normal. No increase in right ventricular wall thickness. Global RV systolic function is has normal systolic function. The tricuspid regurgitant velocity is 2.12 m/s, and with an assumed right atrial pressure  of 15 mmHg, the estimated right ventricular systolic pressure is mildly elevated at 32.9 mmHg. Left Atrium: Left atrial size was normal in size. Right Atrium: Right atrial size was normal in size Pericardium: There is no evidence of pericardial effusion. Mitral Valve: The mitral valve is grossly normal. There is mild thickening of the mitral  valve leaflet(s). MV peak gradient, 9.0 mmHg. Mild mitral valve regurgitation. Tricuspid Valve: The tricuspid valve is normal in structure. Tricuspid valve regurgitation is trivial. Aortic Valve: The aortic valve is normal in structure. Aortic valve regurgitation is not visualized. The aortic valve is structurally normal, with no evidence of sclerosis or stenosis. Aortic valve mean gradient measures 5.0 mmHg. Aortic valve peak gradient measures 8.3 mmHg. Aortic valve area, by VTI measures 1.90 cm. Pulmonic Valve: The pulmonic valve was normal in structure. Pulmonic valve regurgitation is not visualized. Aorta: The aortic root and ascending aorta are structurally normal, with no evidence of dilitation. IAS/Shunts: The atrial septum is grossly normal.  LEFT VENTRICLE PLAX 2D  LVIDd:         3.40 cm  Diastology LVIDs:         2.50 cm  LV e' lateral:   8.92 cm/s LV PW:         1.40 cm  LV E/e' lateral: 17.7 LV IVS:        1.40 cm  LV e' medial:    8.05 cm/s LVOT diam:     1.80 cm  LV E/e' medial:  19.6 LV SV:         25 ml LV SV Index:   14.80 LVOT Area:     2.54 cm  RIGHT VENTRICLE             IVC RV Basal diam:  2.40 cm     IVC diam: 2.50 cm RV S prime:     10.10 cm/s TAPSE (M-mode): 2.2 cm LEFT ATRIUM             Index       RIGHT ATRIUM          Index LA diam:        3.60 cm 2.07 cm/m  RA Area:     9.86 cm LA Vol (A2C):   54.2 ml 31.15 ml/m RA Volume:   20.30 ml 11.67 ml/m LA Vol (A4C):   38.5 ml 22.13 ml/m LA Biplane Vol: 47.5 ml 27.30 ml/m  AORTIC VALVE AV Area (Vmax):    1.86 cm AV Area (Vmean):   1.79 cm AV Area (VTI):     1.90 cm AV Vmax:           144.00 cm/s AV Vmean:          109.000 cm/s AV VTI:            0.302 m AV Peak Grad:      8.3 mmHg AV Mean Grad:      5.0 mmHg LVOT Vmax:         105.00 cm/s LVOT Vmean:        76.800 cm/s LVOT VTI:          0.225 m LVOT/AV VTI ratio: 0.75  AORTA Ao Root diam: 2.70 cm MITRAL VALVE                         TRICUSPID VALVE MV Area (PHT): 5.54 cm              TR Peak grad:   17.9 mmHg MV Peak grad:  9.0 mmHg              TR Vmax:        234.00 cm/s MV Mean grad:  3.0 mmHg MV Vmax:       1.50 m/s  SHUNTS MV Vmean:      86.2 cm/s             Systemic VTI:  0.22 m MV VTI:        0.36 m                Systemic Diam: 1.80 cm MV PHT:        39.73 msec MV Decel Time: 137 msec MV E velocity: 158.00 cm/s 103 cm/s MV A velocity: 114.00 cm/s 70.3 cm/s MV E/A ratio:  1.39        1.5  Mertie Moores MD Electronically signed by Mertie Moores MD Signature Date/Time: 04/24/2019/11:39:48 AM    Final    ECHO TEE  Result Date: 04/29/2019   TRANSESOPHOGEAL ECHO REPORT   Patient Name:   VIE DECELLES Date of Exam: 04/28/2019 Medical Rec #:  [id number]        Height:       70.0 in Accession #:    [id number]       Weight:       115.0 lb  Date of Birth:  1956-02-16        BSA:          1.65 m Patient Age:    89 years         BP:           172/95 mmHg Patient Gender: F                HR:           85 bpm. Exam Location:  Inpatient  Procedure: Transesophageal Echo, Cardiac Doppler and Color Doppler Indications:     Endocarditis  History:         Patient has prior history of Echocardiogram examinations, most                  recent 04/23/2019. Polysubstance abuse, Etoh abuse,.  Sonographer:     Dustin Flock Referring Phys:  83 RHONDA G BARRETT Diagnosing Phys: Cherlynn Kaiser MD  PROCEDURE: After discussion of the risks and benefits of a TEE, an informed consent was obtained from the patient. Local oropharyngeal anesthetic was provided with Benzocaine spray. Patients was monitored while under deep sedation. The patient was intubated. The transesophogeal probe was passed through the esophogus of the patient. Imaged were obtained with the patient in a left lateral decubitus position. Image quality was excellent. The patient's vital signs; including heart rate, blood pressure, and oxygen saturation; remained stable throughout the procedure. The patient developed no complications during the procedure. IMPRESSIONS  1. No intracardiac source of embolism seen. No valvular vegetations.  2. Left ventricular ejection fraction, by visual estimation, is 60 to 65%. The left ventricle has normal function. Normal left ventricular size. There is no left ventricular hypertrophy.  3. Global right ventricle has normal systolic function.The right ventricular size is normal. No increase in right ventricular wall thickness.  4. Left atrial size was normal.  5. Right atrial size was normal.  6. Trivial pericardial effusion is present.  7. The mitral valve is normal in structure. Trace mitral valve regurgitation. No evidence of mitral stenosis.  8. The tricuspid valve is normal in structure. Tricuspid valve regurgitation is trivial.  9. The aortic valve is tricuspid.  Aortic valve regurgitation is not visualized. No evidence of aortic valve sclerosis or stenosis. 10. The pulmonic valve was normal in structure. Pulmonic valve regurgitation is not visualized. 11. Mild plaque invoving the transverse aorta.  12. The inferior vena cava is normal in size with greater than 50% respiratory variability, suggesting right atrial pressure of 3 mmHg. FINDINGS  Left Ventricle: Left ventricular ejection fraction, by visual estimation, is 60 to 65%. The left ventricle has normal function. There is no left ventricular hypertrophy. Normal left ventricular size. Normal left atrial pressure. Right Ventricle: The right ventricular size is normal. No increase in right ventricular wall thickness. Global RV systolic function is has normal systolic function. Prominent moderator band. Left Atrium: Left atrial size was normal in size. Right Atrium: Right atrial size was normal in size Pericardium: Trivial pericardial effusion is present. Mitral Valve: The mitral valve is normal in structure. No evidence of mitral valve stenosis by observation. Trace mitral valve regurgitation. Tricuspid Valve: The tricuspid valve is normal in structure. Tricuspid valve regurgitation is trivial. Aortic Valve: The aortic valve is tricuspid. Aortic valve regurgitation is not visualized. The aortic valve is structurally normal, with no evidence of sclerosis or stenosis. Pulmonic Valve: The pulmonic valve was normal in structure. Pulmonic valve regurgitation is not visualized. Aorta: The aortic root, ascending aorta and aortic arch are all structurally normal, with no evidence of dilitation or obstruction. There is mild plaque involving the transverse aorta. Venous: The inferior vena cava is normal in size with greater than 50% respiratory variability, suggesting right atrial pressure of 3 mmHg. Shunts: There is no evidence of a patent foramen ovale. No ventricular septal defect is seen or detected. There is no evidence of an  atrial septal defect. No atrial level shunt detected by color flow Doppler.  Cherlynn Kaiser MD Electronically signed by Cherlynn Kaiser MD Signature Date/Time: 04/29/2019/12:27:44 AM    Final    CT HEAD CODE STROKE WO CONTRAST  Result Date: 04/16/2019 CLINICAL DATA:  Code stroke.  Aphasia EXAM: CT HEAD WITHOUT CONTRAST TECHNIQUE: Contiguous axial images were obtained from the base of the skull through the vertex without intravenous contrast. COMPARISON:  None. FINDINGS: Brain: There is no mass, hemorrhage or extra-axial collection. There is generalized atrophy without lobar predilection. There is hypoattenuation of the periventricular white matter, most commonly indicating chronic ischemic microangiopathy. Vascular: No abnormal hyperdensity of the major intracranial arteries or dural venous sinuses. No intracranial atherosclerosis. Skull: The visualized skull base, calvarium and extracranial soft tissues are normal. Sinuses/Orbits: No fluid levels or advanced mucosal thickening of the visualized paranasal sinuses. No mastoid or middle ear effusion. The orbits are normal. ASPECTS [facility] Stroke Program Early CT Score) - Ganglionic level infarction (caudate, lentiform nuclei, internal capsule, insula, M1-M3 cortex): 7 - Supraganglionic infarction (M4-M6 cortex): 3 Total score (0-10 with 10 being normal): 10 IMPRESSION: 1. No intracranial hemorrhage or other acute finding. 2. ASPECTS is 10. 3. These results were communicated to Dr. Karena Addison Aroor at 3:05 am on 04/16/2019 by text page via the [facility] messaging system. Electronically Signed   By: Ulyses Jarred M.D.   On: 04/16/2019 03:06   VAS [place] LOWER EXTREMITY VENOUS (DVT)  Result Date: 04/23/2019  Lower Venous Study Indications: Leukocytosis.  Comparison Study: No prior study. Performing Technologist: Maudry Mayhew MHA, RDMS, RVT, RDCS  Examination Guidelines: A complete evaluation includes B-mode imaging, spectral Doppler, color Doppler, and power  Doppler as needed of all accessible portions of each vessel. Bilateral testing is considered an integral part of a complete examination. Limited examinations for reoccurring indications may be performed as noted.  +---------+---------------+---------+-----------+----------+--------------+ RIGHT    CompressibilityPhasicitySpontaneityPropertiesThrombus Aging +---------+---------------+---------+-----------+----------+--------------+ CFV      Full  Yes      Yes                                 +---------+---------------+---------+-----------+----------+--------------+ SFJ      Full                                                        +---------+---------------+---------+-----------+----------+--------------+ FV Prox  Full                                                        +---------+---------------+---------+-----------+----------+--------------+ FV Mid   Full                                                        +---------+---------------+---------+-----------+----------+--------------+ FV DistalFull                                                        +---------+---------------+---------+-----------+----------+--------------+ PFV      Full                                                        +---------+---------------+---------+-----------+----------+--------------+ POP      Full           Yes      Yes                                 +---------+---------------+---------+-----------+----------+--------------+ PTV      Full                                                        +---------+---------------+---------+-----------+----------+--------------+ PERO     Full                                                        +---------+---------------+---------+-----------+----------+--------------+   +---------+---------------+---------+-----------+----------+--------------+ LEFT      CompressibilityPhasicitySpontaneityPropertiesThrombus Aging +---------+---------------+---------+-----------+----------+--------------+ CFV      Full           Yes      Yes                                 +---------+---------------+---------+-----------+----------+--------------+ SFJ  Full                                                        +---------+---------------+---------+-----------+----------+--------------+ FV Prox  Full                                                        +---------+---------------+---------+-----------+----------+--------------+ FV Mid   Full                                                        +---------+---------------+---------+-----------+----------+--------------+ FV DistalFull                                                        +---------+---------------+---------+-----------+----------+--------------+ PFV      Full                                                        +---------+---------------+---------+-----------+----------+--------------+ POP      Full           Yes      Yes                                 +---------+---------------+---------+-----------+----------+--------------+ PTV      Full                                                        +---------+---------------+---------+-----------+----------+--------------+ PERO     Full                                                        +---------+---------------+---------+-----------+----------+--------------+     Summary: Right: There is no evidence of deep vein thrombosis in the lower extremity. No cystic structure found in the popliteal fossa. Left: There is no evidence of deep vein thrombosis in the lower extremity. A cystic structure is found in the popliteal fossa.  *See table(s) above for measurements and observations. Electronically signed by Servando Snare MD on 04/23/2019 at 6:12:30 PM.    Final      The results of significant  diagnostics from this hospitalization (including imaging, microbiology, ancillary and laboratory) are listed below for reference.     Microbiology: Recent Results (from the past 240 hour(s))  Culture, blood (routine x 2)     Status:  None   Collection Time: 04/23/19  8:37 AM   Specimen: BLOOD RIGHT ARM  Result Value Ref Range Status   Specimen Description BLOOD RIGHT ARM  Final   Special Requests   Final    BOTTLES DRAWN AEROBIC ONLY Blood Culture results may not be optimal due to an inadequate volume of blood received in culture bottles   Culture   Final    NO GROWTH 5 DAYS Performed at [facility], [address], [place], [place] [address]    Report Status 04/28/2019 FINAL  Final  Culture, blood (routine x 2)     Status: None   Collection Time: 04/23/19  8:46 AM   Specimen: BLOOD RIGHT HAND  Result Value Ref Range Status   Specimen Description BLOOD RIGHT HAND  Final   Special Requests   Final    BOTTLES DRAWN AEROBIC ONLY Blood Culture results may not be optimal due to an inadequate volume of blood received in culture bottles   Culture   Final    NO GROWTH 5 DAYS Performed at [facility], [place] [address] [place], [place] [address]    Report Status 04/28/2019 FINAL  Final  Culture, Urine     Status: None   Collection Time: 04/23/19 12:52 PM   Specimen: Urine, Random  Result Value Ref Range Status   Specimen Description URINE, RANDOM  Final   Special Requests NONE  Final   Culture   Final    NO GROWTH Performed at [facility], [place] [address], [place], [place] [address]    Report Status 04/24/2019 FINAL  Final  Culture, Urine     Status: None   Collection Time: 04/23/19  4:33 PM   Specimen: Urine, Random  Result Value Ref Range Status   Specimen Description URINE, RANDOM  Final   Special Requests NONE  Final   Culture   Final    NO GROWTH Performed at [facility], [place] [address] [place], [place] [address]    Report Status 04/24/2019 FINAL   Final  Culture, blood (routine x 2)     Status: None   Collection Time: 04/25/19  3:00 PM   Specimen: BLOOD RIGHT HAND  Result Value Ref Range Status   Specimen Description BLOOD RIGHT HAND  Final   Special Requests AEROBIC BOTTLE ONLY Blood Culture adequate volume  Final   Culture   Final    NO GROWTH 5 DAYS Performed at [facility], [place] [address] [place], [place] [address]    Report Status 04/30/2019 FINAL  Final  Culture, blood (routine x 2)     Status: None   Collection Time: 04/25/19  3:04 PM   Specimen: BLOOD RIGHT WRIST  Result Value Ref Range Status   Specimen Description BLOOD RIGHT WRIST  Final   Special Requests AEROBIC BOTTLE ONLY Blood Culture adequate volume  Final   Culture   Final    NO GROWTH 5 DAYS Performed at [facility], [place] [address], [place], [place] [address]    Report Status 04/30/2019 FINAL  Final  MRSA PCR Screening     Status: None   Collection Time: 04/27/19  1:37 PM   Specimen: Nasal Mucosa; Nasopharyngeal  Result Value Ref Range Status   MRSA by PCR NEGATIVE NEGATIVE Final    Comment:        The GeneXpert MRSA Assay (FDA approved for NASAL specimens only), is one component of a comprehensive  MRSA colonization surveillance program. It is not intended to diagnose MRSA infection nor to guide or monitor treatment for MRSA infections. Performed at [facility], [place] [address], [place], [place] [address]   SARS CORONAVIRUS 2 (TAT 6-24 HRS) Nasopharyngeal Nasopharyngeal Swab     Status: None   Collection Time: 04/30/19  5:58 PM   Specimen: Nasopharyngeal Swab  Result Value Ref Range Status   SARS Coronavirus 2 NEGATIVE NEGATIVE Final    Comment: (NOTE) SARS-CoV-2 target nucleic acids are NOT DETECTED. The SARS-CoV-2 RNA is generally detectable in upper and lower respiratory specimens during the acute phase of infection. Negative results do not preclude SARS-CoV-2 infection, do not rule out co-infections with other  pathogens, and should not be used as the sole basis for treatment or other patient management decisions. Negative results must be combined with clinical observations, patient history, and epidemiological information. The expected result is Negative. Fact Sheet for Patients: SugarRoll.be Fact Sheet for Healthcare Providers: https://www.woods-mathews.com/ This test is not yet approved or cleared by the [place] FDA and  has been authorized for detection and/or diagnosis of SARS-CoV-2 by FDA under an Emergency Use Authorization (EUA). This EUA will remain  in effect (meaning this test can be used) for the duration of the COVID-19 declaration under Section 56 4(b)(1) of the Act, 21 U.S.C. section 360bbb-3(b)(1), unless the authorization is terminated or revoked sooner. Performed at [facility], [place] [address] [place], [place] [address]      Labs: BNP (last 3 results) No results for input(s): BNP in the last 8760 hours. Basic Metabolic Panel: Recent Labs  Lab 04/25/19 0303 04/26/19 0340 04/27/19 1004 04/28/19 0348 04/29/19 0410 04/30/19 0900 04/30/19 1359 05/01/19 0247  NA 137 138 139 138 141  --  142 139  K 4.2 4.0 3.9 4.0 4.1  --  3.7 4.1  CL 102 106 105 105 105  --  108 104  CO2 25 24 23 24 22   --  26 23  GLUCOSE 83 122* 81 79 188*  --  82 113*  BUN 10 8 6* 6* 10  --  7* 5*  CREATININE 1.26* 1.11* 1.11* 1.10* 1.34* 1.21* 1.08* 1.08*  CALCIUM 8.9 9.3 9.1 8.7* 8.2*  --  8.4* 8.6*  MG 1.9 1.6* 1.5* 1.9 1.6*  --   --   --   PHOS 2.4* 1.9* 4.3 5.4* 5.8*  --   --   --    Liver Function Tests: Recent Labs  Lab 04/25/19 0303 04/26/19 0340 04/27/19 1004 04/28/19 0348 04/29/19 0410  AST 49* 43* 33 25 24  ALT 24 25 20 16 14   ALKPHOS 199* 210* 222* 178* 183*  BILITOT 0.7 0.6 0.9 0.7 0.7  PROT 6.4* 6.5 7.1 6.5 6.8  ALBUMIN 2.7* 2.8* 2.6* 2.4* 2.3*   Recent Labs  Lab 04/25/19 0303  LIPASE 22   No results for  input(s): AMMONIA in the last 168 hours. CBC: Recent Labs  Lab 04/25/19 0303 04/26/19 0340 04/27/19 1004 04/28/19 0348 04/29/19 0410 05/01/19 0247  WBC 25.3* 28.1* 28.4* 27.8* 35.1* 34.1*  NEUTROABS 19.9* 24.4* 21.4* 21.8* 31.5*  --   HGB 7.7* 8.0* 7.9* 7.8* 8.2* 8.4*  HCT 22.4* 24.2* 22.9* 21.6* 24.2* 24.2*  MCV 100.0 101.7* 98.3 96.0 98.8 97.2  PLT 388 394 PLATELET CLUMPS NOTED ON SMEAR, UNABLE TO ESTIMATE 314 349 304   Cardiac Enzymes: No results for input(s): CKTOTAL, CKMB, CKMBINDEX, TROPONINI in the last 168 hours. BNP: Invalid  input(s): POCBNP CBG: Recent Labs  Lab 04/25/19 1705  GLUCAP 79   D-Dimer No results for input(s): DDIMER in the last 72 hours. Hgb A1c No results for input(s): HGBA1C in the last 72 hours. Lipid Profile No results for input(s): CHOL, HDL, LDLCALC, TRIG, CHOLHDL, LDLDIRECT in the last 72 hours. Thyroid function studies No results for input(s): TSH, T4TOTAL, T3FREE, THYROIDAB in the last 72 hours.  Invalid input(s): FREET3 Anemia work up No results for input(s): VITAMINB12, FOLATE, FERRITIN, TIBC, IRON, RETICCTPCT in the last 72 hours. Urinalysis    Component Value Date/Time   COLORURINE YELLOW 04/23/2019 1900   APPEARANCEUR HAZY (A) 04/23/2019 1900   LABSPEC 1.013 04/23/2019 1900   PHURINE 7.0 04/23/2019 1900   GLUCOSEU NEGATIVE 04/23/2019 1900   HGBUR NEGATIVE 04/23/2019 1900   BILIRUBINUR NEGATIVE 04/23/2019 1900   KETONESUR NEGATIVE 04/23/2019 1900   PROTEINUR NEGATIVE 04/23/2019 1900   NITRITE NEGATIVE 04/23/2019 1900   LEUKOCYTESUR LARGE (A) 04/23/2019 1900   Sepsis Labs Invalid input(s): PROCALCITONIN,  WBC,  LACTICIDVEN Microbiology Recent Results (from the past 240 hour(s))  Culture, blood (routine x 2)     Status: None   Collection Time: 04/23/19  8:37 AM   Specimen: BLOOD RIGHT ARM  Result Value Ref Range Status   Specimen Description BLOOD RIGHT ARM  Final   Special Requests   Final    BOTTLES DRAWN AEROBIC ONLY  Blood Culture results may not be optimal due to an inadequate volume of blood received in culture bottles   Culture   Final    NO GROWTH 5 DAYS Performed at [facility], [place] [address], [place], [place] [address]    Report Status 04/28/2019 FINAL  Final  Culture, blood (routine x 2)     Status: None   Collection Time: 04/23/19  8:46 AM   Specimen: BLOOD RIGHT HAND  Result Value Ref Range Status   Specimen Description BLOOD RIGHT HAND  Final   Special Requests   Final    BOTTLES DRAWN AEROBIC ONLY Blood Culture results may not be optimal due to an inadequate volume of blood received in culture bottles   Culture   Final    NO GROWTH 5 DAYS Performed at [facility], [place] [address], [place], [place] [address]    Report Status 04/28/2019 FINAL  Final  Culture, Urine     Status: None   Collection Time: 04/23/19 12:52 PM   Specimen: Urine, Random  Result Value Ref Range Status   Specimen Description URINE, RANDOM  Final   Special Requests NONE  Final   Culture   Final    NO GROWTH Performed at [facility], [place] [address], [place], [place] [address]    Report Status 04/24/2019 FINAL  Final  Culture, Urine     Status: None   Collection Time: 04/23/19  4:33 PM   Specimen: Urine, Random  Result Value Ref Range Status   Specimen Description URINE, RANDOM  Final   Special Requests NONE  Final   Culture   Final    NO GROWTH Performed at [facility], [place] [address] [place], [place] [address]    Report Status 04/24/2019 FINAL  Final  Culture, blood (routine x 2)     Status: None   Collection Time: 04/25/19  3:00 PM   Specimen: BLOOD RIGHT HAND  Result Value Ref Range Status   Specimen Description BLOOD RIGHT HAND  Final   Special Requests AEROBIC  BOTTLE ONLY Blood Culture adequate volume  Final   Culture   Final    NO GROWTH 5 DAYS Performed at [facility], [place] [address], [place], [place] [address]    Report Status 04/30/2019 FINAL  Final   Culture, blood (routine x 2)     Status: None   Collection Time: 04/25/19  3:04 PM   Specimen: BLOOD RIGHT WRIST  Result Value Ref Range Status   Specimen Description BLOOD RIGHT WRIST  Final   Special Requests AEROBIC BOTTLE ONLY Blood Culture adequate volume  Final   Culture   Final    NO GROWTH 5 DAYS Performed at [facility], [place] [address], [place], [place] [address]    Report Status 04/30/2019 FINAL  Final  MRSA PCR Screening     Status: None   Collection Time: 04/27/19  1:37 PM   Specimen: Nasal Mucosa; Nasopharyngeal  Result Value Ref Range Status   MRSA by PCR NEGATIVE NEGATIVE Final    Comment:        The GeneXpert MRSA Assay (FDA approved for NASAL specimens only), is one component of a comprehensive MRSA colonization surveillance program. It is not intended to diagnose MRSA infection nor to guide or monitor treatment for MRSA infections. Performed at [facility], [place] [address] [place], [place] [address]   SARS CORONAVIRUS 2 (TAT 6-24 HRS) Nasopharyngeal Nasopharyngeal Swab     Status: None   Collection Time: 04/30/19  5:58 PM   Specimen: Nasopharyngeal Swab  Result Value Ref Range Status   SARS Coronavirus 2 NEGATIVE NEGATIVE Final    Comment: (NOTE) SARS-CoV-2 target nucleic acids are NOT DETECTED. The SARS-CoV-2 RNA is generally detectable in upper and lower respiratory specimens during the acute phase of infection. Negative results do not preclude SARS-CoV-2 infection, do not rule out co-infections with other pathogens, and should not be used as the sole basis for treatment or other patient management decisions. Negative results must be combined with clinical observations, patient history, and epidemiological information. The expected result is Negative. Fact Sheet for Patients: SugarRoll.be Fact Sheet for Healthcare Providers: https://www.woods-mathews.com/ This test is not yet approved or cleared  by the [place] FDA and  has been authorized for detection and/or diagnosis of SARS-CoV-2 by FDA under an Emergency Use Authorization (EUA). This EUA will remain  in effect (meaning this test can be used) for the duration of the COVID-19 declaration under Section 56 4(b)(1) of the Act, 21 U.S.C. section 360bbb-3(b)(1), unless the authorization is terminated or revoked sooner. Performed at [facility], [place] [address], [place], [place] [address]      Time coordinating discharge in minutes: 32  SIGNED:   Debbe Odea, MD  [facility] 05/01/2019, 8:24 AM Pager   If 7PM-7AM, please contact night-coverage [email or web address] Password [email or web address]

## 2019-05-01 NOTE — TOC Transition Note (Addendum)
Transition of Care [facility]) - CM/SW Discharge Note   Patient Details  Name: Christie Morrison MRN: [id number] Date of Birth: 1956/01/28  Transition of Care [facility]) CM/SW Contact:  Rae Mar, RN Phone Number: 05/01/2019, 10:17 AM   Clinical Narrative:     CM note pt was ready for transition home.  CM contacted this weeks charity [facility] organization, [facility].  Cassie with [facility] was unable to accept pt for [facility] PT/OT services due to patients substance abuse history.  CM placed ambulatory outpatient orders for PT/OT who will contact the pt.  Information placed on AVS.  CM contacted Keon with [employer] who will deliver and evaluate for charity DME walker and 3-in-1.  Updated Dr. Wynelle Cleveland and Ananias Pilgrim, RN.  CSW will speak with pt regarding transportation needs.  CM was additionally contacted about pt's medications.  On arrival to pt's room to speak with her about her financial situation to get prescriptions, pt was not in her room and her DME was gone.  No staff were aware she left.  Pt did not use the taxi cab voucher provided to her by CSW.  No further TOC needs noted at this time.  Final next level of care: [place] Barriers to Discharge: Barriers Resolved   Patient Goals and CMS Choice        Discharge Placement                       Discharge Plan and Services   Discharge Planning Services: CM Consult Post Acute Care Choice: Durable Medical Equipment, Home Health          DME Arranged: Walker rolling, 3-N-1 DME Agency: [facility] Date DME Agency Contacted: 05/01/19 Time DME Agency Contacted: 0930 Representative spoke with at DME Agency: Keon HH Arranged: PT, OT [place] Agency: [facility] [place] Date [place]: 05/01/19 Time [place]: [place] Representative spoke with at [place]: Cassie  Social Determinants of Health ([place]) Interventions     Readmission Risk Interventions Readmission Risk Prevention Plan 10/16/2018   Transportation Screening Complete  PCP or Specialist Appt within 3-5 Days (No Data)  Palliative Care Screening Not Applicable  Medication Review (RN Care Manager) Referral to Pharmacy  Some recent data might be hidden

## 2019-05-01 NOTE — Progress Notes (Signed)
Physical Therapy Treatment Patient Details Name: Christie Morrison MRN: [id number] DOB: November 18, 1955 Today's Date: 05/01/2019    History of Present Illness 63yo female presenting with seizure, confusion and disorientation. CT negative for acute CVA and code stroke ultimately cancelled. Admitted for seizure like activity in setting of severe electrolyte derangement. PMH cocaine use, ROH abuse, HLD, seizure    PT Comments    Pt eager to d/c home.  Nurse at bedside removing telemetry and IV.  Pt assisted in Lower body dressing and packing up room.  She was able to navigate around her room and reach outside BOS to pack up her room.  Pt donner her shoes in a seated position independently.  Pt continues to improve she is performing bed mobility and transfers independently.  Remains to require supervision for gt with out device.  Encouraged use of device until balance improve.  Pt able to perform stair training with support from railing.  Plan to d/c home today.  Recommendation remain appropriate for HHPT follow up.  Pt refused HEP for home use.     Follow Up Recommendations  Home health PT;Supervision/Assistance - 24 hour     Equipment Recommendations  None recommended by PT    Recommendations for Other Services       Precautions / Restrictions Precautions Precautions: Fall Restrictions Weight Bearing Restrictions: No    Mobility  Bed Mobility Overal bed mobility: Independent                Transfers Overall transfer level: Modified independent Equipment used: None Transfers: Sit to/from Stand Sit to Stand: Modified independent (Device/Increase time)         General transfer comment: No assistance to need to stand.  Ambulation/Gait Ambulation/Gait assistance: Supervision Gait Distance (Feet): 100 Feet Assistive device: None Gait Pattern/deviations: Step-through pattern;Drifts right/left Gait velocity: decreased   General Gait Details: Pt with improved reciprocal  armswing but continues to drift during session.  She was educated to continue to use the RW at home when unassisted unitl her balance improves.   Stairs Stairs: Yes Stairs assistance: Supervision Stair Management: Forwards;Two rails Number of Stairs: 10 General stair comments: x4 6 inch steps and x6 4 in steps.  Use of rail and reciprocal pattern,  NO LOB.   Wheelchair Mobility    Modified Rankin (Stroke Patients Only)       Balance Overall balance assessment: Needs assistance   Sitting balance-Leahy Scale: Normal       Standing balance-Leahy Scale: Fair                              Cognition Arousal/Alertness: Awake/alert Behavior During Therapy: WFL for tasks assessed/performed Overall Cognitive Status: Within Functional Limits for tasks assessed                                 General Comments: Pt appears to be functioning within normal limits, minor cues for safety.      Exercises      General Comments        Pertinent Vitals/Pain Pain Assessment: Faces Faces Pain Scale: Hurts little more Pain Location: abdomen ( distended ) Pain Descriptors / Indicators: Aching Pain Intervention(s): Monitored during session;Repositioned    Home Living                      Prior Function  PT Goals (current goals can now be found in the care plan section) Acute Rehab PT Goals Patient Stated Goal: to go back to work  Potential to Achieve Goals: Good Progress towards PT goals: Progressing toward goals    Frequency    Min 3X/week      PT Plan Current plan remains appropriate    Co-evaluation              AM-PAC PT "6 Clicks" Mobility   Outcome Measure  Help needed turning from your back to your side while in a flat bed without using bedrails?: None Help needed moving from lying on your back to sitting on the side of a flat bed without using bedrails?: None Help needed moving to and from a bed to a chair  (including a wheelchair)?: None Help needed standing up from a chair using your arms (e.g., wheelchair or bedside chair)?: None Help needed to walk in hospital room?: A Little Help needed climbing 3-5 steps with a railing? : A Little 6 Click Score: 22    End of Session Equipment Utilized During Treatment: Gait belt Activity Tolerance: Patient limited by fatigue Patient left: in bed;with call bell/phone within reach(sitting edge of bed on the phone post session.) Nurse Communication: Mobility status PT Visit Diagnosis: Unsteadiness on feet (R26.81);Difficulty in walking, not elsewhere classified (R26.2);Other abnormalities of gait and mobility (R26.89);Muscle weakness (generalized) (M62.81)     Time: [id number] PT Time Calculation (min) (ACUTE ONLY): 20 min  Charges:  $Gait Training: 8-22 mins                     Erasmo Leventhal , PTA Acute Rehabilitation Services Pager [phone] Office [phone]     Alonzo Loving Eli Hose 05/01/2019, 10:41 AM

## 2019-05-01 NOTE — Plan of Care (Signed)
  Problem: Education: Goal: Knowledge of General Education information will improve Description: Including pain rating scale, medication(s)/side effects and non-pharmacologic comfort measures Outcome: Completed/Met   Problem: Health Behavior/Discharge Planning: Goal: Ability to manage health-related needs will improve Outcome: Completed/Met   Problem: Pain Managment: Goal: General experience of comfort will improve Outcome: Completed/Met   Problem: Safety: Goal: Ability to remain free from injury will improve Outcome: Completed/Met   Problem: Skin Integrity: Goal: Risk for impaired skin integrity will decrease Outcome: Completed/Met   Problem: Education: Goal: Expressions of having a comfortable level of knowledge regarding the disease process will increase Outcome: Completed/Met   Problem: Coping: Goal: Ability to adjust to condition or change in health will improve Outcome: Completed/Met Goal: Ability to identify appropriate support needs will improve Outcome: Adequate for Discharge   Problem: Health Behavior/Discharge Planning: Goal: Compliance with prescribed medication regimen will improve Outcome: Completed/Met   Problem: Medication: Goal: Risk for medication side effects will decrease Outcome: Adequate for Discharge   Problem: Clinical Measurements: Goal: Complications related to the disease process, condition or treatment will be avoided or minimized Outcome: Adequate for Discharge Goal: Diagnostic test results will improve Outcome: Adequate for Discharge   Problem: Safety: Goal: Verbalization of understanding the information provided will improve Outcome: Completed/Met   Problem: Self-Concept: Goal: Level of anxiety will decrease Outcome: Adequate for Discharge Goal: Ability to verbalize feelings about condition will improve Outcome: Completed/Met

## 2019-05-06 ENCOUNTER — Inpatient Hospital Stay: Payer: Self-pay

## 2019-05-08 ENCOUNTER — Telehealth: Payer: Self-pay

## 2019-05-08 NOTE — Telephone Encounter (Signed)
Patient called and states that she needs a letter stating that she can work up to 4 hours a day.  Please follow up with patient when letter is ready for pick up.

## 2019-05-11 NOTE — Telephone Encounter (Signed)
Attempt to call patient back to inform she needs an OV.  Patient was not home and was informed the friend/family to have the patient call back.

## 2019-05-19 ENCOUNTER — Ambulatory Visit: Payer: Self-pay | Attending: Nurse Practitioner | Admitting: Nurse Practitioner

## 2019-05-19 ENCOUNTER — Other Ambulatory Visit: Payer: Self-pay

## 2019-05-19 ENCOUNTER — Encounter: Payer: Self-pay | Admitting: Nurse Practitioner

## 2019-05-19 DIAGNOSIS — E876 Hypokalemia: Secondary | ICD-10-CM

## 2019-05-19 DIAGNOSIS — Z09 Encounter for follow-up examination after completed treatment for conditions other than malignant neoplasm: Secondary | ICD-10-CM

## 2019-05-19 DIAGNOSIS — D72829 Elevated white blood cell count, unspecified: Secondary | ICD-10-CM

## 2019-05-19 MED ORDER — DM-GUAIFENESIN ER 30-600 MG PO TB12: 1.0000 | ORAL_TABLET | Freq: Two times a day (BID) | ORAL | 0 refills | Status: DC | PRN

## 2019-05-19 MED ORDER — MAGNESIUM OXIDE -MG SUPPLEMENT 400 (240 MG) MG PO TABS: 1.0000 | ORAL_TABLET | Freq: Every day | ORAL | 0 refills | Status: DC

## 2019-05-19 MED ORDER — THIAMINE HCL 100 MG PO TABS: 100.0000 mg | ORAL_TABLET | Freq: Every day | ORAL | 0 refills | Status: DC

## 2019-05-19 MED ORDER — POTASSIUM CHLORIDE CRYS ER 20 MEQ PO TBCR: 20.0000 meq | EXTENDED_RELEASE_TABLET | Freq: Every day | ORAL | 0 refills | Status: DC

## 2019-05-19 NOTE — Progress Notes (Signed)
Virtual Visit via Telephone Note Due to national recommendations of social distancing due to [place] 19, telehealth visit is felt to be most appropriate for this patient at this time.  I discussed the limitations, risks, security and privacy concerns of performing an evaluation and management service by telephone and the availability of in person appointments. I also discussed with the patient that there may be a patient responsible charge related to this service. The patient expressed understanding and agreed to proceed.    I connected with Lethea Killings on 05/19/19  at   2:10 PM EST  EDT by telephone and verified that I am speaking with the correct person using two identifiers.   Consent I discussed the limitations, risks, security and privacy concerns of performing an evaluation and management service by telephone and the availability of in person appointments. I also discussed with the patient that there may be a patient responsible charge related to this service. The patient expressed understanding and agreed to proceed.   Location of Patient: Private Residence   Location of Provider: [place] and [place] participating in Telemedicine visit: Geryl Rankins FNP-BC [place]    History of Present Illness: Telemedicine visit for: [place] Admitted to the hospital from 11-26 through 05-01-2019 after presenting with seizure.  Her hospital course was complicated by RLL PNA, AonC kidney disease, UTI, leukocytosis, severe electrolyte imbalance. It was initially felt her seizures were related to alcohol withdrawal. EEG was negative. [place] suggestive of cirrhosis. HEP panel negative. UDS positive for benzos and cocaine.  Will need to recheck CMP and CBC.  PER ED NOTES: 12/9- hematology consulted for rising WBC count (and splenic infarcts) and Dr Alen Blew feels that if her count does not improved, she may need outpt work up for a myeloproliferative  neoplasm  Today she reports that she has not picked up any medications at this time due to financial difficulties. I will have her medications sent to our clinical pharmacy  Notes some mild left sided pain which occurs during activity.   Past Medical History:  Diagnosis Date  . Acute hepatitis   . Cirrhosis ([place])   . Crack cocaine use   . Duodenitis   . ETOH abuse   . High cholesterol   . Hypertension   . Mallory-Weiss tear   . Seizures ([place])     Past Surgical History:  Procedure Laterality Date  . BUBBLE STUDY  04/28/2019   Procedure: BUBBLE STUDY;  Surgeon: Elouise Munroe, MD;  Location: [place];  Service: Cardiology;;  . [place]    . TEE WITHOUT CARDIOVERSION N/A 04/28/2019   Procedure: TRANSESOPHAGEAL ECHOCARDIOGRAM (TEE);  Surgeon: Elouise Munroe, MD;  Location: [place];  Service: Cardiology;  Laterality: N/A;    Family History  Problem Relation Age of Onset  . Diabetes Mellitus II Mother   . Kidney failure Mother   . Alcohol abuse Mother   . Diabetes Mellitus II Brother   . Kidney failure Brother   . Alcohol abuse Maternal Grandmother   . Hypertension Other     Social History   Socioeconomic History  . Marital status: Single    Spouse name: Not on file  . Number of children: Not on file  . Years of education: Not on file  . Highest education level: Not on file  Occupational History  . Not on file  Tobacco Use  . Smoking status: Never Smoker  . Smokeless tobacco: Never  Used  Substance and Sexual Activity  . Alcohol use: Yes    Comment: occasionally   . Drug use: Not Currently    Frequency: 1.0 times per week    Types: Cocaine  . Sexual activity: Never  Other Topics Concern  . Not on file  Social History Narrative   Pt lives in 1 story home with her roommate and son   Has 1 child   Has bachelors degree   Currently work at the [employer]   Social Determinants of Radio broadcast assistant Strain:   . Difficulty of Paying  Living Expenses: Not on file  Food Insecurity:   . Worried About Charity fundraiser in the Last Year: Not on file  . Ran Out of Food in the Last Year: Not on file  Transportation Needs:   . Lack of Transportation (Medical): Not on file  . Lack of Transportation (Non-Medical): Not on file  Physical Activity:   . Days of Exercise per Week: Not on file  . Minutes of Exercise per Session: Not on file  Stress:   . Feeling of Stress : Not on file  Social Connections:   . Frequency of Communication with Friends and Family: Not on file  . Frequency of Social Gatherings with Friends and Family: Not on file  . Attends Religious Services: Not on file  . Active Member of Clubs or Organizations: Not on file  . Attends Archivist Meetings: Not on file  . Marital Status: Not on file     Observations/Objective: Awake, alert and oriented x 3   Review of Systems  Constitutional: Negative for fever, malaise/fatigue and weight loss.  HENT: Negative.  Negative for nosebleeds.   Eyes: Negative.  Negative for blurred vision, double vision and photophobia.  Respiratory: Negative.  Negative for cough and shortness of breath.   Cardiovascular: Negative.  Negative for chest pain, palpitations and leg swelling.  Gastrointestinal: Negative.  Negative for heartburn, nausea and vomiting.  Genitourinary: Positive for flank pain.  Musculoskeletal: Negative for myalgias.  Neurological: Negative.  Negative for dizziness, focal weakness, seizures and headaches.  Psychiatric/Behavioral: Positive for substance abuse. Negative for suicidal ideas.    Assessment and Plan: Mikaiah was seen today for hospitalization follow-up.  Diagnoses and all orders for this visit:  Hospital discharge follow-up  Hypokalemia -     potassium chloride SA (KLOR-CON) 20 MEQ tablet; Take 1 tablet (20 mEq total) by mouth daily. -     CMP14+EGFR; Future  Leukocytosis, unspecified type -     CBC; Future  Other orders -      Magnesium Oxide 400 (240 Mg) MG TABS; Take 1 tablet (400 mg total) by mouth daily. -     thiamine 100 MG tablet; Take 1 tablet (100 mg total) by mouth daily. -     dextromethorphan-guaiFENesin (MUCINEX DM) 30-600 MG 12hr tablet; Take 1 tablet by mouth 2 (two) times daily as needed for cough.     Follow Up Instructions Return in about 6 weeks (around 06/30/2019).     I discussed the assessment and treatment plan with the patient. The patient was provided an opportunity to ask questions and all were answered. The patient agreed with the plan and demonstrated an understanding of the instructions.   The patient was advised to call back or seek an in-person evaluation if the symptoms worsen or if the condition fails to improve as anticipated.  I provided 19 minutes of non-face-to-face time during this  encounter including median intraservice time, reviewing previous notes, labs, imaging, medications and explaining diagnosis and management.  Gildardo Pounds, FNP-BC

## 2019-05-20 ENCOUNTER — Other Ambulatory Visit: Payer: Self-pay

## 2019-05-20 ENCOUNTER — Ambulatory Visit: Payer: Self-pay | Attending: Family Medicine

## 2019-05-20 ENCOUNTER — Encounter: Payer: Self-pay | Admitting: Anesthesiology

## 2019-05-20 ENCOUNTER — Telehealth: Payer: Self-pay | Admitting: Nurse Practitioner

## 2019-05-20 DIAGNOSIS — D72829 Elevated white blood cell count, unspecified: Secondary | ICD-10-CM

## 2019-05-20 DIAGNOSIS — E876 Hypokalemia: Secondary | ICD-10-CM

## 2019-05-20 NOTE — Addendum Note (Signed)
Addendum  created 05/20/19 0727 by Lillia Abed, MD   Intraprocedure Event edited, Intraprocedure Staff edited

## 2019-05-20 NOTE — Addendum Note (Signed)
Addendum  created 05/20/19 0734 by Lillia Abed, MD   Intraprocedure Event edited, Intraprocedure Staff edited

## 2019-05-20 NOTE — Telephone Encounter (Signed)
Pt came to the office that she resting a letter for work that she is able to go back to work, otherwise she won't let her work, please follow up

## 2019-05-20 NOTE — Addendum Note (Signed)
Addendum  created 05/20/19 0724 by Lillia Abed, MD   Intraprocedure Event edited, Intraprocedure Staff edited

## 2019-05-21 LAB — CMP14+EGFR
ALT: 12 IU/L (ref 0–32)
AST: 47 IU/L — ABNORMAL HIGH (ref 0–40)
Albumin/Globulin Ratio: 0.8 — ABNORMAL LOW (ref 1.2–2.2)
Albumin: 3.5 g/dL — ABNORMAL LOW (ref 3.8–4.8)
Alkaline Phosphatase: 358 IU/L — ABNORMAL HIGH (ref 39–117)
BUN/Creatinine Ratio: 4 — ABNORMAL LOW (ref 12–28)
BUN: 4 mg/dL — ABNORMAL LOW (ref 8–27)
Bilirubin Total: 0.6 mg/dL (ref 0.0–1.2)
CO2: 19 mmol/L — ABNORMAL LOW (ref 20–29)
Calcium: 8.1 mg/dL — ABNORMAL LOW (ref 8.7–10.3)
Chloride: 95 mmol/L — ABNORMAL LOW (ref 96–106)
Creatinine, Ser: 1.11 mg/dL — ABNORMAL HIGH (ref 0.57–1.00)
GFR calc Af Amer: 62 mL/min/{1.73_m2} (ref >59)
GFR calc non Af Amer: 53 mL/min/{1.73_m2} — ABNORMAL LOW (ref >59)
Globulin, Total: 4.6 g/dL — ABNORMAL HIGH (ref 1.5–4.5)
Glucose: 94 mg/dL (ref 65–99)
Potassium: 3.7 mmol/L (ref 3.5–5.2)
Sodium: 137 mmol/L (ref 134–144)
Total Protein: 8.1 g/dL (ref 6.0–8.5)

## 2019-05-21 LAB — CBC
Hematocrit: 26.4 % — ABNORMAL LOW (ref 34.0–46.6)
Hemoglobin: 8.6 g/dL — ABNORMAL LOW (ref 11.1–15.9)
MCH: 32.3 pg (ref 26.6–33.0)
MCHC: 32.6 g/dL (ref 31.5–35.7)
MCV: 99 fL — ABNORMAL HIGH (ref 79–97)
Platelets: 408 10*3/uL (ref 150–450)
RBC: 2.66 x10E6/uL — CL (ref 3.77–5.28)
RDW: 14.9 % (ref 11.7–15.4)
WBC: 27.3 10*3/uL (ref 3.4–10.8)

## 2019-05-24 NOTE — Telephone Encounter (Signed)
Please call patient to request dates that need to be included in the letter. How long has she been out and when is she going back

## 2019-05-25 ENCOUNTER — Other Ambulatory Visit: Payer: Self-pay | Admitting: Nurse Practitioner

## 2019-05-25 DIAGNOSIS — Z1211 Encounter for screening for malignant neoplasm of colon: Secondary | ICD-10-CM

## 2019-05-25 DIAGNOSIS — D72829 Elevated white blood cell count, unspecified: Secondary | ICD-10-CM

## 2019-05-25 DIAGNOSIS — R748 Abnormal levels of other serum enzymes: Secondary | ICD-10-CM

## 2019-05-26 ENCOUNTER — Ambulatory Visit: Payer: Self-pay | Admitting: Gastroenterology

## 2019-05-26 NOTE — Telephone Encounter (Signed)
Spoke to patient. Pt. Stated she was out since 05/01/2019. She would like to return to work on 05/28/2018.  CMA informed patient will give her a call once the letter Is ready. Pt. Understood.

## 2019-05-28 ENCOUNTER — Encounter: Payer: Self-pay | Admitting: Nurse Practitioner

## 2019-05-28 NOTE — Telephone Encounter (Signed)
Note in chart 

## 2019-05-29 NOTE — Telephone Encounter (Signed)
Attempt to reach patient to inform letter is ready. No answer and unable to leave a VM due to mailbox is full.

## 2019-06-17 ENCOUNTER — Telehealth: Payer: Self-pay | Admitting: Hematology

## 2019-06-17 NOTE — Telephone Encounter (Signed)
Christie Morrison cld to schedule a new hem appt to see Dr. Irene Limbo on 2/2 at [facility]. She's been made aware to arrive 15 minutes early.

## 2019-06-22 NOTE — [facility] (Signed)
HEMATOLOGY/ONCOLOGY CLINIC NOTE  Date of Service: 06/23/2019  Patient Care Team: Christie Pounds, NP as PCP - General (Nurse Practitioner)  CHIEF COMPLAINTS/PURPOSE OF CONSULTATION:  Leukocytosis/Anemia  HISTORY OF PRESENTING ILLNESS:   Christie Morrison is a wonderful 64 y.o. female who has been referred to [place] by Christie Rankins, NP for evaluation and management of leukocytosis/anemia. The pt reports that she is doing well overall.   The pt reports that she has not seen a hemotologist previously and has never been told about her leukocytosis or anemia. Pt has had several episodes of syncope. She has been admitted to the hospital for her fainting, the last time was a few months ago. Pt has never needed any blood transfusions. Pt denies any recent weight loss and eats twice per day. Her roommate usually cooks her food. Pt denies any nasal/septum issues or dental issues. She has noticed some chills but has not had any fevers. Christie Rankins, NP is her PCP but she has not had a visit with her in sometime due to inability to pay. She was last seen at [facility].   Pt notes that she has never been told that she has liver cirrhosis and currently drinks 6-8 oz of Vodka per day. She has not regularly drank more than 8oz of alcohol in a day. She has stopped drinking for periods of time and claims to have never had withdrawal symptoms. She has been referred to a rehab facility but did not attend for financial reasons. Pt has attended [facility]. Pt denies any chemical sniffing. She is currently using crack cocaine, the last time was 2 days ago. Pt sniffs the cocaine and does not use any drugs IV. She does not lace the crack cocaine with other chemicals. She denies smoking cigarettes.  The pt denies abdominal pain or bloody stools but has black stools. She last had black stools 2 days ago. Her stool today was brown but was still slightly darkened. She has never been told that she had  Hepatitis or esophageal tearing. Pt does not remember ever having seizures. Pt has no knowledge of any family history of blood disorders or cancers.   Pt was working at [employer] but they have placed her on leave for her episodes of syncope. She needs to get a release to be able to go back to work. Pt is getting social security benefits once per month at this time. She currently lives with a roommate and her 8 year old son. Her son has a job and is not a drug user. Her son is the only family involved in her care as everyone else lives out of state. Pt is not currently driving and is using public transportation. She is not taking any multivitamins nor any medications to treat her HTN or HLD.   Most recent lab results (05/20/2019) of CBC and CMP is as follows: all values are WNL except for WBC at 27.3K, RBC at 2.66, Hgb at 8.6, HCT at 26.4, MCV at 99, BUN at 4, Creatinine at 1.11, GFR Est Non Af Am at 53, BUN/Creatinine Ratio at 4, Chloride at 95, CO2 at 19,  Calcium at 8.1, Albumin at 3.5, Total Glbulin at 4.6, Albumin/Globulin Ratio at 0.8, Alkaline Phosphatase at 358, AST at 47.  On review of systems, pt reports black stools, chills and denies fevers, night sweats, unexpected weight loss, abdominal pain, bloody stools, nasal/septum issues, dental issues, skin lesions, skin rashes and any other symptoms.  On PMHx the pt reports Acute hepatitis, Cirrhosis, Crack cocaine use, Alcohol Abuse, HLD, HTN, Mallory-Weiss tear, Withdrawal Induced Seizures. On Social Hx the pt reports that she is a non smoker and drinks 6-8 oz of Vodka per day  MEDICAL HISTORY:  Past Medical History:  Diagnosis Date  . Acute hepatitis   . Cirrhosis ([place])   . Crack cocaine use   . Duodenitis   . ETOH abuse   . High cholesterol   . Hypertension   . Mallory-Weiss tear   . Seizures ([place])     SURGICAL HISTORY: Past Surgical History:  Procedure Laterality Date  . BUBBLE STUDY  04/28/2019   Procedure: BUBBLE STUDY;   Surgeon: Elouise Munroe, MD;  Location: [facility];  Service: Cardiology;;  . [place]    . TEE WITHOUT CARDIOVERSION N/A 04/28/2019   Procedure: TRANSESOPHAGEAL ECHOCARDIOGRAM (TEE);  Surgeon: Elouise Munroe, MD;  Location: [facility];  Service: Cardiology;  Laterality: N/A;    SOCIAL HISTORY: Social History   Socioeconomic History  . Marital status: Single    Spouse name: Not on file  . Number of children: Not on file  . Years of education: Not on file  . Highest education level: Not on file  Occupational History  . Not on file  Tobacco Use  . Smoking status: Never Smoker  . Smokeless tobacco: Never Used  Substance and Sexual Activity  . Alcohol use: Yes    Comment: occasionally   . Drug use: Not Currently    Frequency: 1.0 times per week    Types: Cocaine  . Sexual activity: Never  Other Topics Concern  . Not on file  Social History Narrative   Pt lives in 1 story home with her roommate and son   Has 1 child   Has bachelors degree   Currently work at the fish [employer]   Social Determinants of Radio broadcast assistant Strain:   . Difficulty of Paying Living Expenses: Not on file  Food Insecurity:   . Worried About Charity fundraiser in the Last Year: Not on file  . Ran Out of Food in the Last Year: Not on file  Transportation Needs:   . Lack of Transportation (Medical): Not on file  . Lack of Transportation (Non-Medical): Not on file  Physical Activity:   . Days of Exercise per Week: Not on file  . Minutes of Exercise per Session: Not on file  Stress:   . Feeling of Stress : Not on file  Social Connections:   . Frequency of Communication with Friends and Family: Not on file  . Frequency of Social Gatherings with Friends and Family: Not on file  . Attends Religious Services: Not on file  . Active Member of Clubs or Organizations: Not on file  . Attends Archivist Meetings: Not on file  . Marital Status: Not on file  Intimate  Partner Violence:   . Fear of Current or Ex-Partner: Not on file  . Emotionally Abused: Not on file  . Physically Abused: Not on file  . Sexually Abused: Not on file    FAMILY HISTORY: Family History  Problem Relation Age of Onset  . Diabetes Mellitus II Mother   . Kidney failure Mother   . Alcohol abuse Mother   . Diabetes Mellitus II Brother   . Kidney failure Brother   . Alcohol abuse Maternal Grandmother   . Hypertension Other     ALLERGIES:  has No Known  Allergies.  MEDICATIONS:  Current Outpatient Medications  Medication Sig Dispense Refill  . dextromethorphan-guaiFENesin (MUCINEX DM) 30-600 MG 12hr tablet Take 1 tablet by mouth 2 (two) times daily as needed for cough. (Patient not taking: Reported on 06/23/2019) 30 tablet 0  . Magnesium Oxide 400 (240 Mg) MG TABS Take 1 tablet (400 mg total) by mouth daily. (Patient not taking: Reported on 06/23/2019) 30 tablet 0  . Multiple Vitamin (MULTIVITAMIN WITH MINERALS) TABS tablet Take 1 tablet by mouth daily. (Patient not taking: Reported on 05/19/2019) 30 tablet 0  . potassium chloride SA (KLOR-CON) 20 MEQ tablet Take 1 tablet (20 mEq total) by mouth daily. (Patient not taking: Reported on 06/23/2019) 30 tablet 0  . thiamine 100 MG tablet Take 1 tablet (100 mg total) by mouth daily. (Patient not taking: Reported on 06/23/2019) 30 tablet 0   No current facility-administered medications for this visit.    REVIEW OF SYSTEMS:    10 Point review of Systems was done is negative except as noted above.  PHYSICAL EXAMINATION: ECOG PERFORMANCE STATUS: 2 - Symptomatic, <50% confined to bed  . Vitals:   06/23/19 1046  BP: (!) 151/101  Pulse: 75  Resp: 18  Temp: 97.8 F (36.6 C)  SpO2: 100%   Filed Weights   06/23/19 1046  Weight: 125 lb 14.4 oz (57.1 kg)   .Body mass index is 18.06 kg/m.  GENERAL:alert, in no acute distress and comfortable, inflammation of inferior nasal turbinate SKIN: no acute rashes, no significant lesions,  changes of peripheral artery disease in b/l upper extremities EYES: conjunctiva are pink and non-injected, sclera anicteric OROPHARYNX: MMM, no exudates, no oropharyngeal erythema or ulceration NECK: supple, no JVD LYMPH:  no palpable lymphadenopathy in the cervical, axillary or inguinal regions LUNGS: clear to auscultation b/l with normal respiratory effort HEART: regular rate & rhythm ABDOMEN:  normoactive bowel sounds , non tender, not distended. Extremity: no pedal edema PSYCH: alert & oriented x 3 with fluent speech NEURO: no focal motor/sensory deficits  LABORATORY DATA:  I have reviewed the data as listed  . CBC Latest Ref Rng & Units 06/23/2019 05/20/2019 05/01/2019  WBC 4.0 - 10.5 K/uL 11.4(H) 27.3(HH) 34.1(H)  Hemoglobin 12.0 - 15.0 g/dL 9.8(L) 8.6(L) 8.4(L)  Hematocrit 36.0 - 46.0 % 30.7(L) 26.4(L) 24.2(L)  Platelets 150 - 400 K/uL 286 408 304    . CMP Latest Ref Rng & Units 06/23/2019 05/20/2019 05/01/2019  Glucose 70 - 99 mg/dL 79 94 113(H)  BUN 8 - 23 mg/dL 9 4(L) 5(L)  Creatinine 0.44 - 1.00 mg/dL 1.30(H) 1.11(H) 1.08(H)  Sodium 135 - 145 mmol/L 143 137 139  Potassium 3.5 - 5.1 mmol/L 3.4(L) 3.7 4.1  Chloride 98 - 111 mmol/L 99 95(L) 104  CO2 22 - 32 mmol/L 28 19(L) 23  Calcium 8.9 - 10.3 mg/dL 9.1 8.1(L) 8.6(L)  Total Protein 6.5 - 8.1 g/dL 8.6(H) 8.1 -  Total Bilirubin 0.3 - 1.2 mg/dL 0.5 0.6 -  Alkaline Phos 38 - 126 U/L 227(H) 358(H) -  AST 15 - 41 U/L 46(H) 47(H) -  ALT 0 - 44 U/L 15 12 -   Component     Latest Ref Rng & Units 06/08/2016 06/08/2016 06/09/2016        10:47 AM  4:08 PM   WBC     3.4 - 10.8 x10E3/uL 5.1 11.3 (H) 8.5  RBC     3.77 - 5.28 x10E6/uL 4.17 3.87 3.53 (L)  Hemoglobin     11.1 - 15.9  g/dL 13.6 12.5 11.5 (L)  HCT     34.0 - 46.6 % 41.2 38.2 35.0 (L)   Component     Latest Ref Rng & Units 06/13/2018 09/22/2018 10/15/2018            WBC     3.4 - 10.8 x10E3/uL 9.8 18.8 (H) 24.7 (H)  RBC     3.77 - 5.28 x10E6/uL 2.99 (L) 3.23 (L)  3.05 (L)  Hemoglobin     11.1 - 15.9 g/dL 9.9 (L) 11.2 (L) 10.6 (L)  HCT     34.0 - 46.6 % 29.9 (L) 33.0 (L) 32.7 (L)   . Lab Results  Component Value Date   IRON 88 06/23/2019   TIBC 280 06/23/2019   IRONPCTSAT 31 06/23/2019   (Iron and TIBC)  Lab Results  Component Value Date   FERRITIN 742 (H) 06/23/2019   Component     Latest Ref Rng & Units 06/23/2019  IgG (Immunoglobin G), Serum     586 - 1,602 mg/dL 1,994 (H)  IgA     87 - 352 mg/dL 607 (H)  IgM (Immunoglobulin M), Srm     26 - 217 mg/dL 103  Total Protein ELP     6.0 - 8.5 g/dL 8.4  Albumin SerPl Elph-Mcnc     2.9 - 4.4 g/dL 3.8  Alpha 1     0.0 - 0.4 g/dL 0.4  Alpha2 Glob SerPl Elph-Mcnc     0.4 - 1.0 g/dL 0.8  B-Globulin SerPl Elph-Mcnc     0.7 - 1.3 g/dL 1.3  Gamma Glob SerPl Elph-Mcnc     0.4 - 1.8 g/dL 2.2 (H)  M Protein SerPl Elph-Mcnc     Not Observed g/dL Not Observed  Globulin, Total     2.2 - 3.9 g/dL 4.6 (H)  Albumin/Glob SerPl     0.7 - 1.7 0.9  IFE 1      Comment (A)  Please Note (HCV):      Comment  Folate, Hemolysate     Not Estab. ng/mL 301.0  HCT     34.0 - 46.6 % 30.9 (L)  Folate, RBC     >498 ng/mL 974  Copper     80 - 158 ug/dL 161 (H)  Vitamin B12     180 - 914 pg/mL 336    RADIOGRAPHIC STUDIES: I have personally reviewed the radiological images as listed and agreed with the findings in the report. No results found.  ASSESSMENT & PLAN:   64 yo with   1) Leucocytosis/neutrophilia - likely reactive. WBC has spontaneously improved from 35.1k to near normal at 11.4k Polyclonal hypergammaglobulinemia and elevated sed rate likely from an inflammatory process ? Alcohol hepatitis., cocaine abuse vs recurrent infections.  2) ETOH abuse with alcoholic liver cirrhosis  3) Anemia -macrocytic from ETOH abuse, nutritional deficiency, GI losses with reticulocytosis. Hgb improved to 9.8 from 7.8.  PLAN: -Discussed patient's most recent labs from 05/20/2019, all values are WNL  except for WBC at 27.3K, RBC at 2.66, Hgb at 8.6, HCT at 26.4, MCV at 99, BUN at 4, Creatinine at 1.11, GFR Est Non Af Am at 53, BUN/Creatinine Ratio at 4, Chloride at 95, CO2 at 19,  Calcium at 8.1, Albumin at 3.5, Total Glbulin at 4.6, Albumin/Globulin Ratio at 0.8, Alkaline Phosphatase at 358, AST at 47. -Her leukocytosis appears to be from liver inflammation, or repeat infections - not likely a primary bone marrow problem  -Anemia is likely caused by GI bleeding  and liver disease and potential from recurrent infection and ETOH abuse. -Advised pt that her alcohol use will cause her stomach ulcers to continue to bleed -Recommend complete alcohol and crack cocaine cessation  -Pt expressed desire to cease using alcohol and crack cocaine -Advised pt that if she continues to see black stool she would need to see a GI -Recommend pt connect with Dr. Raul Del as soon as possible -Advised pt to go to ED immediately if light headed, dizzy, or black stools -Will get labs today - reviewed as noted above. WBC counts and anemia improving. -pending BCR-ABL -- less likely to be a clonal leucocytosis. -reasonable to take OTC B complex for vit deficiencies.  FOLLOW UP: Labs today RTC with Dr Irene Limbo based on labs See PCP ASAP  All of the patients questions were answered with apparent satisfaction. The patient knows to call the clinic with any problems, questions or concerns.  I spent 35 mins counseling the patient face to face. The total time spent in the appointment was 45 mins minutes and more than 50% was on counseling and direct patient cares.    Sullivan Lone MD [place] AAHIVMS [facility] Hematology/Oncology Physician [facility]  (Office):       [phone] (Work cell):  [phone] (Fax):           [phone]  06/23/2019 3:47 PM  I, Yevette Edwards, am acting as a scribe for Dr. Sullivan Lone.   .I have reviewed the above documentation for accuracy and completeness, and I agree with the  above. Brunetta Genera MD

## 2019-06-23 ENCOUNTER — Inpatient Hospital Stay: Payer: Self-pay | Attending: Hematology | Admitting: Hematology

## 2019-06-23 ENCOUNTER — Other Ambulatory Visit: Payer: Self-pay

## 2019-06-23 ENCOUNTER — Inpatient Hospital Stay: Payer: Self-pay

## 2019-06-23 VITALS — BP 151/101 | HR 75 | Temp 97.8°F | Resp 18 | Ht 70.0 in | Wt 125.9 lb

## 2019-06-23 DIAGNOSIS — B179 Acute viral hepatitis, unspecified: Secondary | ICD-10-CM | POA: Insufficient documentation

## 2019-06-23 DIAGNOSIS — K921 Melena: Secondary | ICD-10-CM | POA: Insufficient documentation

## 2019-06-23 DIAGNOSIS — D649 Anemia, unspecified: Secondary | ICD-10-CM

## 2019-06-23 DIAGNOSIS — R55 Syncope and collapse: Secondary | ICD-10-CM | POA: Insufficient documentation

## 2019-06-23 DIAGNOSIS — F1410 Cocaine abuse, uncomplicated: Secondary | ICD-10-CM | POA: Insufficient documentation

## 2019-06-23 DIAGNOSIS — D72819 Decreased white blood cell count, unspecified: Secondary | ICD-10-CM

## 2019-06-23 DIAGNOSIS — F1010 Alcohol abuse, uncomplicated: Secondary | ICD-10-CM | POA: Insufficient documentation

## 2019-06-23 DIAGNOSIS — I10 Essential (primary) hypertension: Secondary | ICD-10-CM | POA: Insufficient documentation

## 2019-06-23 LAB — VITAMIN B12: Vitamin B-12: 336 pg/mL (ref 180–914)

## 2019-06-23 LAB — IRON AND TIBC
Iron: 88 ug/dL (ref 41–142)
Saturation Ratios: 31 % (ref 21–57)
TIBC: 280 ug/dL (ref 236–444)
UIBC: 192 ug/dL (ref 120–384)

## 2019-06-23 LAB — SEDIMENTATION RATE: Sed Rate: 56 mm/hr — ABNORMAL HIGH (ref 0–22)

## 2019-06-23 LAB — CMP (CANCER CENTER ONLY)
ALT: 15 U/L (ref 0–44)
AST: 46 U/L — ABNORMAL HIGH (ref 15–41)
Albumin: 3.7 g/dL (ref 3.5–5.0)
Alkaline Phosphatase: 227 U/L — ABNORMAL HIGH (ref 38–126)
Anion gap: 16 — ABNORMAL HIGH (ref 5–15)
BUN: 9 mg/dL (ref 8–23)
CO2: 28 mmol/L (ref 22–32)
Calcium: 9.1 mg/dL (ref 8.9–10.3)
Chloride: 99 mmol/L (ref 98–111)
Creatinine: 1.3 mg/dL — ABNORMAL HIGH (ref 0.44–1.00)
GFR, Est AFR Am: 51 mL/min — ABNORMAL LOW (ref >60)
GFR, Estimated: 44 mL/min — ABNORMAL LOW (ref >60)
Glucose, Bld: 79 mg/dL (ref 70–99)
Potassium: 3.4 mmol/L — ABNORMAL LOW (ref 3.5–5.1)
Sodium: 143 mmol/L (ref 135–145)
Total Bilirubin: 0.5 mg/dL (ref 0.3–1.2)
Total Protein: 8.6 g/dL — ABNORMAL HIGH (ref 6.5–8.1)

## 2019-06-23 LAB — CBC WITH DIFFERENTIAL/PLATELET
Abs Immature Granulocytes: 0.03 10*3/uL (ref 0.00–0.07)
Basophils Absolute: 0.1 10*3/uL (ref 0.0–0.1)
Basophils Relative: 1 %
Eosinophils Absolute: 0.6 10*3/uL — ABNORMAL HIGH (ref 0.0–0.5)
Eosinophils Relative: 6 %
HCT: 30.7 % — ABNORMAL LOW (ref 36.0–46.0)
Hemoglobin: 9.8 g/dL — ABNORMAL LOW (ref 12.0–15.0)
Immature Granulocytes: 0 %
Lymphocytes Relative: 24 %
Lymphs Abs: 2.8 10*3/uL (ref 0.7–4.0)
MCH: 31.4 pg (ref 26.0–34.0)
MCHC: 31.9 g/dL (ref 30.0–36.0)
MCV: 98.4 fL (ref 80.0–100.0)
Monocytes Absolute: 0.6 10*3/uL (ref 0.1–1.0)
Monocytes Relative: 5 %
Neutro Abs: 7.3 10*3/uL (ref 1.7–7.7)
Neutrophils Relative %: 64 %
Platelets: 286 10*3/uL (ref 150–400)
RBC: 3.12 MIL/uL — ABNORMAL LOW (ref 3.87–5.11)
RDW: 15.9 % — ABNORMAL HIGH (ref 11.5–15.5)
WBC: 11.4 10*3/uL — ABNORMAL HIGH (ref 4.0–10.5)
nRBC: 0 % (ref 0.0–0.2)

## 2019-06-23 LAB — FERRITIN: Ferritin: 742 ng/mL — ABNORMAL HIGH (ref 11–307)

## 2019-06-23 NOTE — Patient Instructions (Signed)
Thank you for choosing [facility] to provide your oncology and hematology care.   Should you have questions after your visit to the [facility] ([facility]), please contact this office at [phone] between 8:30 AM and 4:30 PM.  Voice mails left after 4:00 PM may not be returned until the following business day.  Calls received after 4:30 PM will be answered by an off-site Nurse Triage Line.    Prescription Refills:  Please have your pharmacy contact us directly for most prescription requests.  Contact the office directly for refills of narcotics (pain medications). Allow 48-72 hours for refills.  Appointments: Please contact the [facility] scheduling department [phone] for questions regarding [facility] appointment scheduling.  Contact the schedulers with any scheduling changes so that your appointment can be rescheduled in a timely manner.   Central Scheduling for [facility] [phone] - Call to schedule procedures such as PET scans, CT scans, MRI, Ultrasound, etc.  To afford each patient quality time with our providers, please arrive 30 minutes before your scheduled appointment time.  If you arrive late for your appointment, you may be asked to reschedule.  We strive to give you quality time with our providers, and arriving late affects you and other patients whose appointments are after yours. If you are a no show for multiple scheduled visits, you may be dismissed from the clinic at the providers discretion.     Resources: [facility] Social Workers [phone] for additional information on assistance programs or assistance connecting with community support programs   [place] DSS  336-641-3447: Information regarding food stamps, Medicaid, and utility assistance SCAT [phone]   [place] [employer]'s shared-ride transportation service for eligible riders who have a disability that prevents them from riding the fixed route bus.   Medicare Rights Center  800-333-4114 Helps people with Medicare understand their rights and benefits, navigate the Medicare system, and secure the quality healthcare they deserve [employer] 800-227-2345 Assists patients locate various types of support and financial assistance Cancer Care: 1-800-813-HOPE (4673) Provides financial assistance, online support groups, medication/co-pay assistance.   Transportation Assistance for appointments at [facility]: Transportation Coordinator [phone]  Again, thank you for choosing  Cancer Center for your care.       

## 2019-06-24 LAB — FOLATE RBC
Folate, Hemolysate: 301 ng/mL
Folate, RBC: 974 ng/mL (ref >498)
Hematocrit: 30.9 % — ABNORMAL LOW (ref 34.0–46.6)

## 2019-06-25 LAB — MULTIPLE MYELOMA PANEL, SERUM
Albumin SerPl Elph-Mcnc: 3.8 g/dL (ref 2.9–4.4)
Albumin/Glob SerPl: 0.9 (ref 0.7–1.7)
Alpha 1: 0.4 g/dL (ref 0.0–0.4)
Alpha2 Glob SerPl Elph-Mcnc: 0.8 g/dL (ref 0.4–1.0)
B-Globulin SerPl Elph-Mcnc: 1.3 g/dL (ref 0.7–1.3)
Gamma Glob SerPl Elph-Mcnc: 2.2 g/dL — ABNORMAL HIGH (ref 0.4–1.8)
Globulin, Total: 4.6 g/dL — ABNORMAL HIGH (ref 2.2–3.9)
IgA: 607 mg/dL — ABNORMAL HIGH (ref 87–352)
IgG (Immunoglobin G), Serum: 1994 mg/dL — ABNORMAL HIGH (ref 586–1602)
IgM (Immunoglobulin M), Srm: 103 mg/dL (ref 26–217)
Total Protein ELP: 8.4 g/dL (ref 6.0–8.5)

## 2019-06-25 LAB — COPPER, SERUM: Copper: 161 ug/dL — ABNORMAL HIGH (ref 80–158)

## 2019-07-02 LAB — BCR ABL1 FISH (GENPATH)

## 2019-07-17 ENCOUNTER — Ambulatory Visit: Payer: Self-pay | Admitting: Nurse Practitioner

## 2019-10-22 ENCOUNTER — Emergency Department (HOSPITAL_COMMUNITY): Payer: Self-pay

## 2019-10-22 ENCOUNTER — Other Ambulatory Visit: Payer: Self-pay

## 2019-10-22 ENCOUNTER — Observation Stay (HOSPITAL_COMMUNITY)
Admission: EM | Admit: 2019-10-22 | Discharge: 2019-10-23 | Disposition: A | Discharge status: Home / Self Care | Payer: Self-pay | Attending: Internal Medicine | Admitting: Internal Medicine

## 2019-10-22 ENCOUNTER — Encounter (HOSPITAL_COMMUNITY): Payer: Self-pay | Admitting: Emergency Medicine

## 2019-10-22 DIAGNOSIS — F1410 Cocaine abuse, uncomplicated: Secondary | ICD-10-CM | POA: Diagnosis present

## 2019-10-22 DIAGNOSIS — D539 Nutritional anemia, unspecified: Secondary | ICD-10-CM | POA: Diagnosis present

## 2019-10-22 DIAGNOSIS — N179 Acute kidney failure, unspecified: Secondary | ICD-10-CM | POA: Insufficient documentation

## 2019-10-22 DIAGNOSIS — D696 Thrombocytopenia, unspecified: Secondary | ICD-10-CM | POA: Diagnosis present

## 2019-10-22 DIAGNOSIS — R197 Diarrhea, unspecified: Secondary | ICD-10-CM | POA: Diagnosis present

## 2019-10-22 DIAGNOSIS — F10239 Alcohol dependence with withdrawal, unspecified: Secondary | ICD-10-CM

## 2019-10-22 DIAGNOSIS — F10939 Alcohol use, unspecified with withdrawal, unspecified: Secondary | ICD-10-CM

## 2019-10-22 DIAGNOSIS — Z8249 Family history of ischemic heart disease and other diseases of the circulatory system: Secondary | ICD-10-CM | POA: Insufficient documentation

## 2019-10-22 DIAGNOSIS — G319 Degenerative disease of nervous system, unspecified: Secondary | ICD-10-CM | POA: Insufficient documentation

## 2019-10-22 DIAGNOSIS — I6782 Cerebral ischemia: Secondary | ICD-10-CM | POA: Insufficient documentation

## 2019-10-22 DIAGNOSIS — Z20822 Contact with and (suspected) exposure to covid-19: Secondary | ICD-10-CM | POA: Insufficient documentation

## 2019-10-22 DIAGNOSIS — E7800 Pure hypercholesterolemia, unspecified: Secondary | ICD-10-CM | POA: Insufficient documentation

## 2019-10-22 DIAGNOSIS — I10 Essential (primary) hypertension: Secondary | ICD-10-CM | POA: Insufficient documentation

## 2019-10-22 DIAGNOSIS — E876 Hypokalemia: Secondary | ICD-10-CM

## 2019-10-22 DIAGNOSIS — K7030 Alcoholic cirrhosis of liver without ascites: Secondary | ICD-10-CM | POA: Insufficient documentation

## 2019-10-22 DIAGNOSIS — R9431 Abnormal electrocardiogram [ECG] [EKG]: Secondary | ICD-10-CM | POA: Diagnosis present

## 2019-10-22 DIAGNOSIS — D72829 Elevated white blood cell count, unspecified: Secondary | ICD-10-CM | POA: Diagnosis present

## 2019-10-22 DIAGNOSIS — R748 Abnormal levels of other serum enzymes: Secondary | ICD-10-CM | POA: Diagnosis present

## 2019-10-22 DIAGNOSIS — F1010 Alcohol abuse, uncomplicated: Secondary | ICD-10-CM | POA: Diagnosis present

## 2019-10-22 DIAGNOSIS — R569 Unspecified convulsions: Principal | ICD-10-CM

## 2019-10-22 DIAGNOSIS — Z841 Family history of disorders of kidney and ureter: Secondary | ICD-10-CM | POA: Insufficient documentation

## 2019-10-22 LAB — CBC WITH DIFFERENTIAL/PLATELET
Abs Immature Granulocytes: 0.11 10*3/uL — ABNORMAL HIGH (ref 0.00–0.07)
Basophils Absolute: 0.1 10*3/uL (ref 0.0–0.1)
Basophils Relative: 0 %
Eosinophils Absolute: 0.1 10*3/uL (ref 0.0–0.5)
Eosinophils Relative: 1 %
HCT: 30.3 % — ABNORMAL LOW (ref 36.0–46.0)
Hemoglobin: 10.2 g/dL — ABNORMAL LOW (ref 12.0–15.0)
Immature Granulocytes: 1 %
Lymphocytes Relative: 7 %
Lymphs Abs: 1.1 10*3/uL (ref 0.7–4.0)
MCH: 34.2 pg — ABNORMAL HIGH (ref 26.0–34.0)
MCHC: 33.7 g/dL (ref 30.0–36.0)
MCV: 101.7 fL — ABNORMAL HIGH (ref 80.0–100.0)
Monocytes Absolute: 0.8 10*3/uL (ref 0.1–1.0)
Monocytes Relative: 5 %
Neutro Abs: 13.6 10*3/uL — ABNORMAL HIGH (ref 1.7–7.7)
Neutrophils Relative %: 86 %
Platelets: 128 10*3/uL — ABNORMAL LOW (ref 150–400)
RBC: 2.98 MIL/uL — ABNORMAL LOW (ref 3.87–5.11)
RDW: 16.6 % — ABNORMAL HIGH (ref 11.5–15.5)
WBC: 15.8 10*3/uL — ABNORMAL HIGH (ref 4.0–10.5)
nRBC: 0 % (ref 0.0–0.2)

## 2019-10-22 LAB — COMPREHENSIVE METABOLIC PANEL
ALT: 22 U/L (ref 0–44)
AST: 69 U/L — ABNORMAL HIGH (ref 15–41)
Albumin: 4.1 g/dL (ref 3.5–5.0)
Alkaline Phosphatase: 141 U/L — ABNORMAL HIGH (ref 38–126)
Anion gap: 25 — ABNORMAL HIGH (ref 5–15)
BUN: 9 mg/dL (ref 8–23)
CO2: 21 mmol/L — ABNORMAL LOW (ref 22–32)
Calcium: 8.8 mg/dL — ABNORMAL LOW (ref 8.9–10.3)
Chloride: 94 mmol/L — ABNORMAL LOW (ref 98–111)
Creatinine, Ser: 0.99 mg/dL (ref 0.44–1.00)
GFR calc Af Amer: >60 mL/min (ref >60)
GFR calc non Af Amer: >60 mL/min (ref >60)
Glucose, Bld: 83 mg/dL (ref 70–99)
Potassium: 3.5 mmol/L (ref 3.5–5.1)
Sodium: 140 mmol/L (ref 135–145)
Total Bilirubin: 1.4 mg/dL — ABNORMAL HIGH (ref 0.3–1.2)
Total Protein: 7.7 g/dL (ref 6.5–8.1)

## 2019-10-22 LAB — URINALYSIS, ROUTINE W REFLEX MICROSCOPIC
Bilirubin Urine: NEGATIVE
Glucose, UA: NEGATIVE mg/dL
Ketones, ur: 5 mg/dL — AB
Leukocytes,Ua: NEGATIVE
Nitrite: NEGATIVE
Protein, ur: 100 mg/dL — AB
Specific Gravity, Urine: 1.009 (ref 1.005–1.030)
pH: 7 (ref 5.0–8.0)

## 2019-10-22 LAB — RAPID URINE DRUG SCREEN, HOSP PERFORMED
Amphetamines: NOT DETECTED
Barbiturates: NOT DETECTED
Benzodiazepines: NOT DETECTED
Cocaine: POSITIVE — AB
Opiates: NOT DETECTED
Tetrahydrocannabinol: NOT DETECTED

## 2019-10-22 LAB — CBG MONITORING, ED: Glucose-Capillary: 92 mg/dL (ref 70–99)

## 2019-10-22 LAB — ETHANOL: Alcohol, Ethyl (B): 19 mg/dL — ABNORMAL HIGH (ref <10)

## 2019-10-22 LAB — SARS CORONAVIRUS 2 BY RT PCR (HOSPITAL ORDER, PERFORMED IN ~~LOC~~ HOSPITAL LAB): SARS Coronavirus 2: NEGATIVE

## 2019-10-22 LAB — HIV ANTIBODY (ROUTINE TESTING W REFLEX): HIV Screen 4th Generation wRfx: NONREACTIVE

## 2019-10-22 LAB — CK: Total CK: 129 U/L (ref 38–234)

## 2019-10-22 LAB — MRSA PCR SCREENING: MRSA by PCR: NEGATIVE

## 2019-10-22 LAB — AMMONIA: Ammonia: 39 umol/L — ABNORMAL HIGH (ref 9–35)

## 2019-10-22 LAB — C DIFFICILE QUICK SCREEN W PCR REFLEX
C Diff antigen: NEGATIVE
C Diff interpretation: NOT DETECTED
C Diff toxin: NEGATIVE

## 2019-10-22 LAB — MAGNESIUM: Magnesium: 0.7 mg/dL — CL (ref 1.7–2.4)

## 2019-10-22 LAB — PHOSPHORUS: Phosphorus: 3.5 mg/dL (ref 2.5–4.6)

## 2019-10-22 MED ORDER — LORAZEPAM 2 MG/ML IJ SOLN: 1.0000 mg | INTRAMUSCULAR | Status: DC | PRN

## 2019-10-22 MED ORDER — SODIUM CHLORIDE 0.9 % IV BOLUS
500.0000 mL | Freq: Once | INTRAVENOUS | Status: AC
  Administered 2019-10-22: 500 mL via INTRAVENOUS

## 2019-10-22 MED ORDER — SODIUM CHLORIDE 0.9 % IV SOLN
75.0000 mL/h | INTRAVENOUS | Status: DC
  Administered 2019-10-22: 75 mL/h via INTRAVENOUS

## 2019-10-22 MED ORDER — VITAMIN B-12 100 MCG PO TABS
100.0000 ug | ORAL_TABLET | Freq: Every day | ORAL | Status: DC
  Administered 2019-10-22 – 2019-10-23 (×2): 100 ug via ORAL
  Filled 2019-10-22 (×2): qty 1

## 2019-10-22 MED ORDER — MAGNESIUM SULFATE 2 GM/50ML IV SOLN
2.0000 g | Freq: Once | INTRAVENOUS | Status: AC
  Administered 2019-10-22: 2 g via INTRAVENOUS
  Filled 2019-10-22: qty 50

## 2019-10-22 MED ORDER — ADULT MULTIVITAMIN W/MINERALS CH
1.0000 | ORAL_TABLET | Freq: Every day | ORAL | Status: DC
  Administered 2019-10-22 – 2019-10-23 (×2): 1 via ORAL
  Filled 2019-10-22 (×2): qty 1

## 2019-10-22 MED ORDER — MAGNESIUM OXIDE -MG SUPPLEMENT 400 (240 MG) MG PO TABS
1.0000 | ORAL_TABLET | Freq: Every day | ORAL | Status: DC
  Administered 2019-10-22: 400 mg via ORAL
  Filled 2019-10-22 (×3): qty 1

## 2019-10-22 MED ORDER — LORAZEPAM 2 MG/ML IJ SOLN: 0.0000 mg | Freq: Four times a day (QID) | INTRAMUSCULAR | Status: DC

## 2019-10-22 MED ORDER — LORAZEPAM 2 MG/ML IJ SOLN: 0.0000 mg | Freq: Two times a day (BID) | INTRAMUSCULAR | Status: DC

## 2019-10-22 MED ORDER — ENOXAPARIN SODIUM 40 MG/0.4ML ~~LOC~~ SOLN
40.0000 mg | SUBCUTANEOUS | Status: DC
  Administered 2019-10-22 – 2019-10-23 (×2): 40 mg via SUBCUTANEOUS
  Filled 2019-10-22 (×2): qty 0.4

## 2019-10-22 MED ORDER — CALCIUM GLUCONATE-NACL 1-0.675 GM/50ML-% IV SOLN
1.0000 g | Freq: Once | INTRAVENOUS | Status: AC
  Administered 2019-10-22: 1000 mg via INTRAVENOUS
  Filled 2019-10-22: qty 50

## 2019-10-22 MED ORDER — SODIUM CHLORIDE 0.9 % IV SOLN
75.0000 mL/h | Freq: Once | INTRAVENOUS | Status: AC
  Administered 2019-10-22: 75 mL/h via INTRAVENOUS

## 2019-10-22 MED ORDER — LORAZEPAM 1 MG PO TABS: 1.0000 mg | ORAL_TABLET | ORAL | Status: DC | PRN

## 2019-10-22 MED ORDER — LORAZEPAM 1 MG PO TABS: 0.0000 mg | ORAL_TABLET | Freq: Two times a day (BID) | ORAL | Status: DC

## 2019-10-22 MED ORDER — ONDANSETRON HCL 4 MG PO TABS: 4.0000 mg | ORAL_TABLET | Freq: Four times a day (QID) | ORAL | Status: DC | PRN

## 2019-10-22 MED ORDER — ONDANSETRON HCL 4 MG/2ML IJ SOLN: 4.0000 mg | Freq: Four times a day (QID) | INTRAMUSCULAR | Status: DC | PRN

## 2019-10-22 MED ORDER — THIAMINE HCL 100 MG PO TABS
100.0000 mg | ORAL_TABLET | Freq: Every day | ORAL | Status: DC
  Administered 2019-10-22 – 2019-10-23 (×2): 100 mg via ORAL
  Filled 2019-10-22 (×2): qty 1

## 2019-10-22 MED ORDER — THIAMINE HCL 100 MG/ML IJ SOLN: 100.0000 mg | Freq: Every day | INTRAMUSCULAR | Status: DC

## 2019-10-22 MED ORDER — POTASSIUM CHLORIDE CRYS ER 20 MEQ PO TBCR
20.0000 meq | EXTENDED_RELEASE_TABLET | Freq: Every day | ORAL | Status: DC
  Administered 2019-10-22 – 2019-10-23 (×2): 20 meq via ORAL
  Filled 2019-10-22 (×2): qty 1

## 2019-10-22 MED ORDER — FOLIC ACID 1 MG PO TABS
1.0000 mg | ORAL_TABLET | Freq: Every day | ORAL | Status: DC
  Administered 2019-10-22 – 2019-10-23 (×2): 1 mg via ORAL
  Filled 2019-10-22 (×2): qty 1

## 2019-10-22 MED ORDER — LORAZEPAM 1 MG PO TABS: 0.0000 mg | ORAL_TABLET | Freq: Four times a day (QID) | ORAL | Status: DC

## 2019-10-22 MED ORDER — LEVETIRACETAM IN NACL 1000 MG/100ML IV SOLN
1000.0000 mg | Freq: Once | INTRAVENOUS | Status: AC
  Administered 2019-10-22: 1000 mg via INTRAVENOUS
  Filled 2019-10-22: qty 100

## 2019-10-22 NOTE — H&P (Signed)
History and Physical    Christie Morrison [id number] DOB: 11-28-1955 DOA: 10/22/2019  Referring MD/NP/PA: Shela Leff, MD PCP: Gildardo Pounds, NP  Patient coming from: home via EMS  Chief Complaint: Seizure  I have personally briefly reviewed patient's old medical records in [place]   HPI: Christie Morrison is a 64 y.o. female with medical history significant of alcoholic cirrhosis, alcohol abuse with previous alcohol-related seizures, hypomagnesemia, hypokalemia, and cocaine abuse who presents after having had a seizure.  Patient has no recollection of the events, but was reportedly witnessed by her son who is sleeping in the bed next to her.  She was reported to have full body convulsions estimated to have the last 5 to 10 minutes.  Seizure patient was reported to be postictal.  She admits that she has had previous seizures in the past related to alcohol use.  Only she drinks a couple shots of vodka per day on average.  Denies having any nausea, vomiting, abdominal pain, fever, headache, chills, body aches, or recent sick contacts.  She reports that she has lost approximately 30 pounds of weight but is not sure why and has had some diarrhea.  ED Course: On admission into the emergency department patient was seen to be afebrile, pulse 98-1 02, respirations 15-30, blood pressures elevated up to 172/115, and O2 saturations currently maintained on room air.  CT scan of the brain did not show any acute findings.  Labs significant for WBC 15.8, hemoglobin 10.2 with MCV 101, platelets 128, CO2 21, anion gap 25, magnesium 0.7 alkaline phosphatase 141, AST 69, ALT 22,  total bilirubin 1.4, and alcohol level 19.  UDS positive for cocaine.  Chest x-ray was noted clear.  Urinalysis did not show any acute signs of infection.  Case has been discussed with neurology who  recommended loading patient with 1 g of Keppra.  She also received 2 g of magnesium sulfate, 500 mL normal saline IV fluids,  and started on CIWA protocols.   Review of Systems  Constitutional: Positive for weight loss. Negative for fever.  HENT: Negative for congestion and sore throat.   Eyes: Negative for photophobia and pain.  Respiratory: Negative for cough and shortness of breath.   Cardiovascular: Negative for chest pain and claudication.  Gastrointestinal: Positive for diarrhea. Negative for abdominal pain, nausea and vomiting.  Genitourinary: Negative for dysuria and hematuria.  Musculoskeletal: Positive for falls.  Skin: Negative for itching.  Neurological: Positive for seizures and loss of consciousness.  Endo/Heme/Allergies: Negative for polydipsia.  Psychiatric/Behavioral: Positive for substance abuse.  All other systems reviewed and are negative.   Past Medical History:  Diagnosis Date  . Acute hepatitis   . Cirrhosis ([place])   . Crack cocaine use   . Duodenitis   . ETOH abuse   . High cholesterol   . Hypertension   . Mallory-Weiss tear   . Seizures ([place])     Past Surgical History:  Procedure Laterality Date  . BUBBLE STUDY  04/28/2019   Procedure: BUBBLE STUDY;  Surgeon: Elouise Munroe, MD;  Location: [facility];  Service: Cardiology;;  . [facility] [place]    . TEE WITHOUT CARDIOVERSION N/A 04/28/2019   Procedure: TRANSESOPHAGEAL ECHOCARDIOGRAM (TEE);  Surgeon: Elouise Munroe, MD;  Location: [facility];  Service: Cardiology;  Laterality: N/A;     reports that she has never smoked. She has never used smokeless tobacco. She reports current alcohol use. She reports previous drug use. Frequency: 1.00 time per  week. Drug: Cocaine.  No Known Allergies  Family History  Problem Relation Age of Onset  . Diabetes Mellitus II Mother   . Kidney failure Mother   . Alcohol abuse Mother   . Diabetes Mellitus II Brother   . Kidney failure Brother   . Alcohol abuse Maternal Grandmother   . Hypertension Other     Prior to Admission medications   Medication Sig Start Date End Date  Taking? Authorizing Provider  dextromethorphan-guaiFENesin (MUCINEX DM) 30-600 MG 12hr tablet Take 1 tablet by mouth 2 (two) times daily as needed for cough. Patient not taking: Reported on 06/23/2019 05/19/19   Gildardo Pounds, NP  Magnesium Oxide 400 (240 Mg) MG TABS Take 1 tablet (400 mg total) by mouth daily. Patient not taking: Reported on 06/23/2019 05/19/19   Gildardo Pounds, NP  Multiple Vitamin (MULTIVITAMIN WITH MINERALS) TABS tablet Take 1 tablet by mouth daily. Patient not taking: Reported on 05/19/2019 05/01/19   Debbe Odea, MD  potassium chloride SA (KLOR-CON) 20 MEQ tablet Take 1 tablet (20 mEq total) by mouth daily. Patient not taking: Reported on 06/23/2019 05/19/19   Gildardo Pounds, NP  thiamine 100 MG tablet Take 1 tablet (100 mg total) by mouth daily. Patient not taking: Reported on 06/23/2019 05/19/19   Gildardo Pounds, NP    Physical Exam:  Constitutional: Thin female who appears chronically ill Vitals:   10/22/19 0815 10/22/19 0830 10/22/19 0845 10/22/19 0900  BP: 126/82 126/82 (!) 143/88 (!) 145/87  Pulse: 97 100 99 92  Resp: 17  14 17   Temp:      TempSrc:      SpO2: 100%  100% 99%   Eyes: PERRL, lids and conjunctivae normal ENMT: Mucous membranes are moist. Posterior pharynx clear of any exudate or lesions.  Neck: normal, supple, no masses, no thyromegaly Respiratory: clear to auscultation bilaterally, no wheezing, no crackles. Normal respiratory effort. No accessory muscle use.  Cardiovascular: Regular rate and rhythm, no murmurs / rubs / gallops. No extremity edema. 2+ pedal pulses. No carotid bruits.   Abdomen: Abdomen soft, but mildly distended with fluid wave.  Bowel sounds present. Musculoskeletal: no clubbing / cyanosis. No joint deformity upper and lower extremities. Good ROM, no contractures. Normal muscle tone.  Skin: no rashes, lesions, ulcers. No induration Neurologic: CN 2-12 grossly intact. Sensation intact, DTR normal. Strength 5/5 in all 4.    Psychiatric: Normal judgment and insight. Alert and oriented x 3. Normal mood.     Labs on Admission: I have personally reviewed following labs and imaging studies  CBC: Recent Labs  Lab 10/22/19 0221  WBC 15.8*  NEUTROABS 13.6*  HGB 10.2*  HCT 30.3*  MCV 101.7*  PLT 0000000*   Basic Metabolic Panel: Recent Labs  Lab 10/22/19 0221 10/22/19 0755  NA 140  --   K 3.5  --   CL 94*  --   CO2 21*  --   GLUCOSE 83  --   BUN 9  --   CREATININE 0.99  --   CALCIUM 8.8*  --   MG 0.7*  --   PHOS  --  3.5   GFR: CrCl cannot be calculated (Unknown ideal weight.). Liver Function Tests: Recent Labs  Lab 10/22/19 0221  AST 69*  ALT 22  ALKPHOS 141*  BILITOT 1.4*  PROT 7.7  ALBUMIN 4.1   No results for input(s): LIPASE, AMYLASE in the last 168 hours. Recent Labs  Lab 10/22/19 0530  AMMONIA 39*  Coagulation Profile: No results for input(s): INR, PROTIME in the last 168 hours. Cardiac Enzymes: No results for input(s): CKTOTAL, CKMB, CKMBINDEX, TROPONINI in the last 168 hours. BNP (last 3 results) No results for input(s): PROBNP in the last 8760 hours. HbA1C: No results for input(s): HGBA1C in the last 72 hours. CBG: Recent Labs  Lab 10/22/19 0226  GLUCAP 92   Lipid Profile: No results for input(s): CHOL, HDL, LDLCALC, TRIG, CHOLHDL, LDLDIRECT in the last 72 hours. Thyroid Function Tests: No results for input(s): TSH, T4TOTAL, FREET4, T3FREE, THYROIDAB in the last 72 hours. Anemia Panel: No results for input(s): VITAMINB12, FOLATE, FERRITIN, TIBC, IRON, RETICCTPCT in the last 72 hours. Urine analysis:    Component Value Date/Time   COLORURINE STRAW (A) 10/22/2019 0202   APPEARANCEUR CLEAR 10/22/2019 0202   LABSPEC 1.009 10/22/2019 0202   PHURINE 7.0 10/22/2019 0202   GLUCOSEU NEGATIVE 10/22/2019 0202   HGBUR SMALL (A) 10/22/2019 0202   BILIRUBINUR NEGATIVE 10/22/2019 0202   KETONESUR 5 (A) 10/22/2019 0202   PROTEINUR 100 (A) 10/22/2019 0202   NITRITE  NEGATIVE 10/22/2019 0202   LEUKOCYTESUR NEGATIVE 10/22/2019 0202   Sepsis Labs: Recent Results (from the past 240 hour(s))  SARS Coronavirus 2 by RT PCR (hospital order, performed in [facility] hospital lab) Nasopharyngeal Nasopharyngeal Swab     Status: None   Collection Time: 10/22/19  5:33 AM   Specimen: Nasopharyngeal Swab  Result Value Ref Range Status   SARS Coronavirus 2 NEGATIVE NEGATIVE Final    Comment: (NOTE) SARS-CoV-2 target nucleic acids are NOT DETECTED. The SARS-CoV-2 RNA is generally detectable in upper and lower respiratory specimens during the acute phase of infection. The lowest concentration of SARS-CoV-2 viral copies this assay can detect is 250 copies / mL. A negative result does not preclude SARS-CoV-2 infection and should not be used as the sole basis for treatment or other patient management decisions.  A negative result may occur with improper specimen collection / handling, submission of specimen other than nasopharyngeal swab, presence of viral mutation(s) within the areas targeted by this assay, and inadequate number of viral copies (<250 copies / mL). A negative result must be combined with clinical observations, patient history, and epidemiological information. Fact Sheet for Patients:   StrictlyIdeas.no Fact Sheet for Healthcare Providers: BankingDealers.co.za This test is not yet approved or cleared  by the [place] FDA and has been authorized for detection and/or diagnosis of SARS-CoV-2 by FDA under an Emergency Use Authorization (EUA).  This EUA will remain in effect (meaning this test can be used) for the duration of the COVID-19 declaration under Section 564(b)(1) of the Act, 21 U.S.C. section 360bbb-3(b)(1), unless the authorization is terminated or revoked sooner. Performed at [facility], [place] [address], [place], [place] [address]      Radiological Exams on Admission: CT HEAD WO  CONTRAST  Result Date: 10/22/2019 CLINICAL DATA:  Recent seizure activity EXAM: CT HEAD WITHOUT CONTRAST TECHNIQUE: Contiguous axial images were obtained from the base of the skull through the vertex without intravenous contrast. COMPARISON:  04/16/2019 FINDINGS: Brain: Mild atrophic changes and chronic white matter ischemic change is seen. No findings to suggest acute hemorrhage, acute infarction or space-occupying mass lesion are noted. Vascular: No hyperdense vessel or unexpected calcification. Skull: Normal. Negative for fracture or focal lesion. Sinuses/Orbits: No acute finding. Other: None. IMPRESSION: Chronic atrophic and ischemic changes without acute abnormality. Electronically Signed   By: Inez Catalina M.D.   On: 10/22/2019 03:51  DG Chest Portable 1 View  Result Date: 10/22/2019 CLINICAL DATA:  Seizure and lymphocytosis EXAM: PORTABLE CHEST 1 VIEW COMPARISON:  April 30, 2019 FINDINGS: The heart size and mediastinal contours are within normal limits. Aortic knob calcifications are seen. Both lungs are clear. The visualized skeletal structures are unremarkable. IMPRESSION: No active disease. Electronically Signed   By: Prudencio Pair M.D.   On: 10/22/2019 04:16    EKG: Independently reviewed.  Sinus rhythm at 95 bpm with QT prolonged at 517.  Assessment/Plan Alcohol withdrawal seizure Alcohol use: Acute.  Patient presents after having a seizure at home witnessed by her son lasting anywhere from 5 to 10 minutes and thereafter patient was noted to be postictal.  She admits to drinking couple shots of vodka daily.  Review of records notes patient presenting with several episodes of seizure related with alcohol withdrawal.  Patient was loaded with 1 g of Keppra IV in the ED. -Admit to a progressive bed -Seizure admission order set utilized -Seizure precautions -Neurochecks -Ativan as needed for seizure activity -IV fluids normal saline at 75 mL/h -CIWA protocols initiated with scheduled  Ativan  Leukocytosis: WBC elevated at 15.8.  Suspect this is reactive in nature to the patient acute seizure.  Chest x-ray and urinalysis negative for any clear signs of infection. -Recheck CBC in a.m.  Prolonged QT: Acute on chronic.  QT prolonged at 517. -Correct electrolyte abnormalities  -Hold QT prolonging medications -Recheck EKG in a.m.  Hypomagnesemia Hypocalcemia: Acute on chronic.  Potassium was 0.7 and calcium 8.8..  Patient had been given 2 g of magnesium sulfate. -Give additional 4 g of magnesium sulfate IV  -Give 1 g of calcium gluconate -Continue to monitor and replace as needed  Diarrhea: Acute.  Patient noted to have several episodes of diarrhea while in the hospital.  Suspect however this could be related with patient's recent cocaine use. -Monitor intake and output -Check C. difficile  Macrocytic anemia: Hemoglobin on admission 10.2 with elevated MCV/MCH.  Vitamin B12 336 on 06/23/2019. -Daily vitamin B12 replacement  Alcoholic cirrhosis: Patient with significant history of alcohol abuse.  Ammonia level is just mildly elevated at 39.  Otherwise patient alert and oriented x3 at this time. -Continue counseling on need of cessation of alcohol use  Elevated liver enzymes, hyperbilirubinemia: Acute on chronic.  AST 69, alkaline phosphatase 141, ALT 22, and total bilirubin 1.5.  The elevated AST to ALT ratio  is consistent with alcohol abuse. -Continue to monitor  Thrombocytopenia: Acute on chronic.  Platelet count 128 on admission, and review of records notes that patient's platelet counts have been low previously in the past.  Suspecting secondary to patient's history of alcohol abuse and cirrhosis. -Continue to monitor  Polysubstance abuse: Patient noted to be positive on screen for cocaine. -Continue to counsel on need of cessation of cocaine use  DVT prophylaxis: Lovenox Code Status: Full Family Communication: Patient declined for family to be  updated Disposition Plan: To be determined Consults called: None Admission status: Observation  Norval Morton MD [facility] Pager [phone]   If 7PM-7AM, please contact night-coverage [email or web address] Password [facility]  10/22/2019, 9:14 AM

## 2019-10-22 NOTE — ED Triage Notes (Signed)
Pt BIB [facility], son noted pt to be having full body convulsions lasting 5-10 minutes. On EMS arrival pt post-ictal and not able answer many questions. On arrival to ED, pt A&O x 2, disoriented to place and situation. Per son, pt has a hx of alcohol withdrawal seizures. EMS VSS.

## 2019-10-22 NOTE — Progress Notes (Signed)
Patient trasfered from ED to [phone] via stretcher; alert and oriented x 4; no complaints of pain; IV in LAC running fluids @ 50 ml/hr; skin intact, dry. Orient patient to room and unit; gave patient care guide; instructed how to use the call bell and  fall risk precautions. Will continue to monitor the patient.

## 2019-10-22 NOTE — ED Notes (Signed)
Pt ambulated to bathroom independently

## 2019-10-22 NOTE — ED Provider Notes (Signed)
[facility] EMERGENCY DEPARTMENT Provider Note   CSN: [id number] Arrival date & time: 10/22/19  0148     History Chief Complaint  Patient presents with  . Seizures    Christie Morrison is a 64 y.o. female.  Christie Morrison is a 64 y.o. female with a history of alcohol abuse, cirrhosis, hypertension, hyperlipidemia, and seizures, who presents to the emergency department via EMS for evaluation of seizure.  Son was awoken from sleep by his mom having a seizure next him in the bed.  He describes full body convulsions which he estimates lasted 5-10 minutes.  Upon EMS arrival seizure activity had resolved, patient postictal and not able to answer questions.  Patient became increasingly alert in transit, and upon arrival to the ED is alert and oriented to self, but continues to be confused and groggy.  Patient's son reports that she has a prior history of seizures related to alcohol withdrawal.  On my evaluation patient is more alert, oriented and able to answer questions.  She has no recollection of seizure.  Currently denies any pain, headache, numbness, weakness, visual change.  States that she last drank a few days ago, reports that she drinks intermittently, but during last episode of drinking drank for several days in a row.  She denies tremors, hallucinations or other symptoms of withdrawal at this time.  Denies any chest pain or shortness of breath, no cough, fever or recent illness.  She denies any abdominal pain, vomiting, diarrhea or dysuria.  Patient states that she has been taking her medications intermittently, is not sure the last time she took her medicine.  Chart review reveals previous admissions for seizures.  Patient has also had severe hypomagnesemia and hypokalemia previously.  Is prescribed magnesium supplementation, but is not sure if she has been taking this.        Past Medical History:  Diagnosis Date  . Acute hepatitis   . Cirrhosis ([place])   . Crack  cocaine use   . Duodenitis   . ETOH abuse   . High cholesterol   . Hypertension   . Mallory-Weiss tear   . Seizures [facility])     Patient Active Problem List   Diagnosis Date Noted  . Lobar pneumonia ([place]) 05/01/2019  . Sepsis ([place]) 04/29/2019  . Alcohol withdrawal seizure ([place]) 04/29/2019  . Splenic infarct   . Klebsiella infection 04/22/2019  . Hypocalcemia 04/16/2019  . Hypomagnesemia 04/16/2019  . Macrocytic anemia 04/16/2019  . Hyponatremia 04/16/2019  . AKI (acute kidney injury) ([place]) 04/16/2019  . Hypokalemia 10/15/2018  . Cirrhosis, alcoholic ([place])   . Acute hepatitis   . Dehydration, severe 06/08/2016  . Elevated lactic acid level 06/08/2016  . Alcohol abuse 06/08/2016  . Melena 06/08/2016  . Cocaine abuse ([place]) 06/08/2016  . Thrombocytopenia ([place]) 06/08/2016  . Acute hypernatremia 06/08/2016  . Acute alcohol intoxication ([place]) 06/08/2016    Past Surgical History:  Procedure Laterality Date  . BUBBLE STUDY  04/28/2019   Procedure: BUBBLE STUDY;  Surgeon: Elouise Munroe, MD;  Location: [facility];  Service: Cardiology;;  . [place]    . TEE WITHOUT CARDIOVERSION N/A 04/28/2019   Procedure: TRANSESOPHAGEAL ECHOCARDIOGRAM (TEE);  Surgeon: Elouise Munroe, MD;  Location: [facility];  Service: Cardiology;  Laterality: N/A;     OB History   No obstetric history on file.     Family History  Problem Relation Age of Onset  . Diabetes Mellitus II Mother   . Kidney  failure Mother   . Alcohol abuse Mother   . Diabetes Mellitus II Brother   . Kidney failure Brother   . Alcohol abuse Maternal Grandmother   . Hypertension Other     Social History   Tobacco Use  . Smoking status: Never Smoker  . Smokeless tobacco: Never Used  Substance Use Topics  . Alcohol use: Yes    Comment: occasionally   . Drug use: Not Currently    Frequency: 1.0 times per week    Types: Cocaine    Home Medications Prior to Admission medications   Medication Sig  Start Date End Date Taking? Authorizing Provider  dextromethorphan-guaiFENesin (MUCINEX DM) 30-600 MG 12hr tablet Take 1 tablet by mouth 2 (two) times daily as needed for cough. Patient not taking: Reported on 06/23/2019 05/19/19   Gildardo Pounds, NP  Magnesium Oxide 400 (240 Mg) MG TABS Take 1 tablet (400 mg total) by mouth daily. Patient not taking: Reported on 06/23/2019 05/19/19   Gildardo Pounds, NP  Multiple Vitamin (MULTIVITAMIN WITH MINERALS) TABS tablet Take 1 tablet by mouth daily. Patient not taking: Reported on 05/19/2019 05/01/19   Debbe Odea, MD  potassium chloride SA (KLOR-CON) 20 MEQ tablet Take 1 tablet (20 mEq total) by mouth daily. Patient not taking: Reported on 06/23/2019 05/19/19   Gildardo Pounds, NP  thiamine 100 MG tablet Take 1 tablet (100 mg total) by mouth daily. Patient not taking: Reported on 06/23/2019 05/19/19   Gildardo Pounds, NP    Allergies    Patient has no known allergies.  Review of Systems   Review of Systems  Constitutional: Negative for chills and fever.  HENT: Negative.   Eyes: Negative for visual disturbance.  Respiratory: Negative for cough and shortness of breath.   Cardiovascular: Negative for chest pain.  Gastrointestinal: Negative for abdominal pain, nausea and vomiting.  Genitourinary: Negative for dysuria.  Musculoskeletal: Negative for arthralgias, back pain, myalgias and neck stiffness.  Skin: Negative for color change and rash.  Neurological: Positive for seizures. Negative for dizziness, tremors, syncope, facial asymmetry, speech difficulty, weakness, light-headedness, numbness and headaches.  All other systems reviewed and are negative.   Physical Exam Updated Vital Signs BP (!) 172/115 (BP Location: Left Arm)   Pulse (!) 102   Resp 16   SpO2 98%   Physical Exam Vitals and nursing note reviewed.  Constitutional:      General: She is not in acute distress.    Appearance: She is well-developed. She is not diaphoretic.      Comments: Alert, oriented, appears older than stated age, sitting in bed and in no acute distress  HENT:     Head: Normocephalic and atraumatic.     Nose: Nose normal.     Mouth/Throat:     Mouth: Mucous membranes are moist.     Pharynx: Oropharynx is clear.  Eyes:     General:        Right eye: No discharge.        Left eye: No discharge.     Extraocular Movements: Extraocular movements intact.     Pupils: Pupils are equal, round, and reactive to light.  Cardiovascular:     Rate and Rhythm: Normal rate and regular rhythm.     Pulses: Normal pulses.     Heart sounds: Normal heart sounds. No murmur. No friction rub. No gallop.   Pulmonary:     Effort: Pulmonary effort is normal. No respiratory distress.     Breath  sounds: Normal breath sounds. No wheezing or rales.     Comments: Respirations equal and unlabored, patient able to speak in full sentences, lungs clear to auscultation bilaterally Abdominal:     General: Bowel sounds are normal. There is no distension.     Palpations: Abdomen is soft. There is no mass.     Tenderness: There is no abdominal tenderness. There is no guarding.     Comments: Abdomen soft, nondistended, nontender to palpation in all quadrants without guarding or peritoneal signs  Musculoskeletal:        General: No deformity.     Cervical back: Neck supple.     Right lower leg: No edema.     Left lower leg: No edema.  Skin:    General: Skin is warm and dry.     Capillary Refill: Capillary refill takes less than 2 seconds.  Neurological:     Mental Status: She is alert.     Coordination: Coordination normal.     Comments: Speech is clear, able to follow commands CN III-XII intact Normal strength in upper and lower extremities bilaterally including dorsiflexion and plantar flexion, strong and equal grip strength Sensation normal to light and sharp touch Moves extremities without ataxia, coordination intact No pronator drift bilaterally  Psychiatric:          Mood and Affect: Mood normal.        Behavior: Behavior normal.     ED Results / Procedures / Treatments   Labs (all labs ordered are listed, but only abnormal results are displayed) Labs Reviewed  CBC WITH DIFFERENTIAL/PLATELET - Abnormal; Notable for the following components:      Result Value   WBC 15.8 (*)    RBC 2.98 (*)    Hemoglobin 10.2 (*)    HCT 30.3 (*)    MCV 101.7 (*)    MCH 34.2 (*)    RDW 16.6 (*)    Platelets 128 (*)    Neutro Abs 13.6 (*)    Abs Immature Granulocytes 0.11 (*)    All other components within normal limits  COMPREHENSIVE METABOLIC PANEL - Abnormal; Notable for the following components:   Chloride 94 (*)    CO2 21 (*)    Calcium 8.8 (*)    AST 69 (*)    Alkaline Phosphatase 141 (*)    Total Bilirubin 1.4 (*)    Anion gap 25 (*)    All other components within normal limits  MAGNESIUM - Abnormal; Notable for the following components:   Magnesium 0.7 (*)    All other components within normal limits  ETHANOL - Abnormal; Notable for the following components:   Alcohol, Ethyl (B) 19 (*)    All other components within normal limits  URINALYSIS, ROUTINE W REFLEX MICROSCOPIC - Abnormal; Notable for the following components:   Color, Urine STRAW (*)    Hgb urine dipstick SMALL (*)    Ketones, ur 5 (*)    Protein, ur 100 (*)    Bacteria, UA RARE (*)    All other components within normal limits  RAPID URINE DRUG SCREEN, HOSP PERFORMED - Abnormal; Notable for the following components:   Cocaine POSITIVE (*)    All other components within normal limits  AMMONIA - Abnormal; Notable for the following components:   Ammonia 39 (*)    All other components within normal limits  SARS CORONAVIRUS 2 BY RT PCR (HOSPITAL ORDER, [facility])  PHOSPHORUS  CBG MONITORING, ED  CBG MONITORING, ED    EKG EKG Interpretation  Date/Time:  Thursday October 22 2019 02:32:22 EDT Ventricular Rate:  95 PR Interval:    QRS  Duration: 88 QT Interval:  411 QTC Calculation: 517 R Axis:   3 Text Interpretation: Sinus rhythm Probable left atrial enlargement Left ventricular hypertrophy Prolonged QT interval Confirmed by Pryor Curia [phone]) on 10/22/2019 2:51:17 AM   Radiology CT HEAD WO CONTRAST  Result Date: 10/22/2019 CLINICAL DATA:  Recent seizure activity EXAM: CT HEAD WITHOUT CONTRAST TECHNIQUE: Contiguous axial images were obtained from the base of the skull through the vertex without intravenous contrast. COMPARISON:  04/16/2019 FINDINGS: Brain: Mild atrophic changes and chronic white matter ischemic change is seen. No findings to suggest acute hemorrhage, acute infarction or space-occupying mass lesion are noted. Vascular: No hyperdense vessel or unexpected calcification. Skull: Normal. Negative for fracture or focal lesion. Sinuses/Orbits: No acute finding. Other: None. IMPRESSION: Chronic atrophic and ischemic changes without acute abnormality. Electronically Signed   By: Inez Catalina M.D.   On: 10/22/2019 03:51   DG Chest Portable 1 View  Result Date: 10/22/2019 CLINICAL DATA:  Seizure and lymphocytosis EXAM: PORTABLE CHEST 1 VIEW COMPARISON:  April 30, 2019 FINDINGS: The heart size and mediastinal contours are within normal limits. Aortic knob calcifications are seen. Both lungs are clear. The visualized skeletal structures are unremarkable. IMPRESSION: No active disease. Electronically Signed   By: Prudencio Pair M.D.   On: 10/22/2019 04:16    Procedures Procedures (including critical care time)  Medications Ordered in ED Medications  LORazepam (ATIVAN) injection 0-4 mg (0 mg Intravenous Not Given 10/22/19 0221)    Or  LORazepam (ATIVAN) tablet 0-4 mg ( Oral See Alternative 10/22/19 0221)  LORazepam (ATIVAN) injection 0-4 mg (has no administration in time range)    Or  LORazepam (ATIVAN) tablet 0-4 mg (has no administration in time range)  thiamine tablet 100 mg (has no administration in time range)     Or  thiamine (B-1) injection 100 mg (has no administration in time range)  sodium chloride 0.9 % bolus 500 mL (0 mLs Intravenous Stopped 10/22/19 0420)  magnesium sulfate IVPB 2 g 50 mL (0 g Intravenous Stopped 10/22/19 0635)  levETIRAcetam (KEPPRA) IVPB 1000 mg/100 mL premix (0 mg Intravenous Stopped 10/22/19 0546)    ED Course  I have reviewed the triage vital signs and the nursing notes.  Pertinent labs & imaging results that were available during my care of the patient were reviewed by me and considered in my medical decision making (see chart for details).    MDM Rules/Calculators/A&P                      64 year old female presents via EMS after witnessed seizure, seizure activity was self resolved, patient postictal upon EMS arrival, mental status steadily improving, on my evaluation she is alert, oriented with no focal neurologic deficits.  Has had previous seizures related to alcohol withdrawal, and last drink a few days ago, is not exhibiting other obvious signs of withdrawal at this time.  Will initiate broad work-up to assess for potential electrolyte derangement, infectious cause, intracranial injury or mass, versus alcohol withdrawal seizure.  Seizure precautions maintained.  I have independently ordered, reviewed and interpreted all labs and imaging: CBC: Leukocytosis of 15.8 with left shift, stable hemoglobin, no current fever or focal infectious symptoms CMP: No significant electrolyte derangements, normal renal function, mildly elevated AST, alk phos and T bili, anion gap of 25,  suspect this is likely lactic acidosis in the setting of seizure. Urinalysis: No signs of infection UDS: Positive for cocaine which could also contribute to seizures Ethanol: 19 low especially for a chronic alcohol user, continue to suspect alcohol withdrawal may have contributed to seizure. Magnesium: Critically low at 0.7, this could absolutely contribute to patient's seizure, 2 g IV magnesium replacement  ordered.  EKG: Sinus rhythm, prolonged QT, likely in the setting of electrolyte derangements  Head CT: No acute intracranial abnormality noted CXR: No active cardiopulmonary disease  Seizures, likely in the setting of hypomagnesemia and potential alcohol withdrawal, patient will require admission for electrolyte repletion and further monitoring.  Case discussed with Dr. Marlowe Sax with [facility] hospitalist who will see and admit the patient.  Case was also discussed with Dr. Malen Gauze with neurology, who recommends magnesium replacement, checking phosphorus, and starting patient on Keppra, IV Keppra load ordered.  Final Clinical Impression(s) / ED Diagnoses Final diagnoses:  Seizure ([place])  Hypomagnesemia  Alcohol withdrawal syndrome with complication [facility])    Rx / DC Orders ED Discharge Orders    None       Jacqlyn Larsen, [place] 10/22/19 7023    Ward, Delice Bison, DO 10/22/19 [phone]

## 2019-10-23 DIAGNOSIS — E876 Hypokalemia: Secondary | ICD-10-CM

## 2019-10-23 LAB — COMPREHENSIVE METABOLIC PANEL
ALT: 17 U/L (ref 0–44)
AST: 62 U/L — ABNORMAL HIGH (ref 15–41)
Albumin: 3.3 g/dL — ABNORMAL LOW (ref 3.5–5.0)
Alkaline Phosphatase: 119 U/L (ref 38–126)
Anion gap: 15 (ref 5–15)
BUN: 8 mg/dL (ref 8–23)
CO2: 26 mmol/L (ref 22–32)
Calcium: 8.8 mg/dL — ABNORMAL LOW (ref 8.9–10.3)
Chloride: 97 mmol/L — ABNORMAL LOW (ref 98–111)
Creatinine, Ser: 1.19 mg/dL — ABNORMAL HIGH (ref 0.44–1.00)
GFR calc Af Amer: 56 mL/min — ABNORMAL LOW (ref >60)
GFR calc non Af Amer: 49 mL/min — ABNORMAL LOW (ref >60)
Glucose, Bld: 100 mg/dL — ABNORMAL HIGH (ref 70–99)
Potassium: 2.8 mmol/L — ABNORMAL LOW (ref 3.5–5.1)
Sodium: 138 mmol/L (ref 135–145)
Total Bilirubin: 1 mg/dL (ref 0.3–1.2)
Total Protein: 6.3 g/dL — ABNORMAL LOW (ref 6.5–8.1)

## 2019-10-23 LAB — POTASSIUM
Potassium: 5.6 mmol/L — ABNORMAL HIGH (ref 3.5–5.1)
Potassium: 4.4 mmol/L (ref 3.5–5.1)

## 2019-10-23 LAB — CBC
HCT: 26.8 % — ABNORMAL LOW (ref 36.0–46.0)
Hemoglobin: 8.8 g/dL — ABNORMAL LOW (ref 12.0–15.0)
MCH: 33.7 pg (ref 26.0–34.0)
MCHC: 32.8 g/dL (ref 30.0–36.0)
MCV: 102.7 fL — ABNORMAL HIGH (ref 80.0–100.0)
Platelets: 108 10*3/uL — ABNORMAL LOW (ref 150–400)
RBC: 2.61 MIL/uL — ABNORMAL LOW (ref 3.87–5.11)
RDW: 16.5 % — ABNORMAL HIGH (ref 11.5–15.5)
WBC: 10.2 10*3/uL (ref 4.0–10.5)
nRBC: 0 % (ref 0.0–0.2)

## 2019-10-23 LAB — PHOSPHORUS: Phosphorus: 3.2 mg/dL (ref 2.5–4.6)

## 2019-10-23 LAB — MAGNESIUM: Magnesium: 2.2 mg/dL (ref 1.7–2.4)

## 2019-10-23 MED ORDER — CYANOCOBALAMIN 100 MCG PO TABS: 100.0000 ug | ORAL_TABLET | Freq: Every day | ORAL | 0 refills | Status: AC

## 2019-10-23 MED ORDER — FOLIC ACID 1 MG PO TABS: 1.0000 mg | ORAL_TABLET | Freq: Every day | ORAL | 0 refills | Status: AC

## 2019-10-23 MED ORDER — MAGNESIUM OXIDE 400 (241.3 MG) MG PO TABS
400.0000 mg | ORAL_TABLET | Freq: Every day | ORAL | Status: DC
  Administered 2019-10-23: 400 mg via ORAL
  Filled 2019-10-23: qty 1

## 2019-10-23 MED ORDER — THIAMINE HCL 100 MG PO TABS: 100.0000 mg | ORAL_TABLET | Freq: Every day | ORAL | 0 refills | Status: AC

## 2019-10-23 MED ORDER — POTASSIUM CHLORIDE CRYS ER 20 MEQ PO TBCR
40.0000 meq | EXTENDED_RELEASE_TABLET | ORAL | Status: AC
  Administered 2019-10-23 (×2): 40 meq via ORAL
  Filled 2019-10-23 (×2): qty 2

## 2019-10-23 MED ORDER — POTASSIUM CHLORIDE 10 MEQ/100ML IV SOLN
10.0000 meq | INTRAVENOUS | Status: AC
  Administered 2019-10-23 (×3): 10 meq via INTRAVENOUS
  Filled 2019-10-23 (×3): qty 100

## 2019-10-23 MED ORDER — MAGNESIUM OXIDE -MG SUPPLEMENT 400 (240 MG) MG PO TABS: 1.0000 | ORAL_TABLET | Freq: Every day | ORAL | 0 refills | Status: AC

## 2019-10-23 MED ORDER — POTASSIUM CHLORIDE CRYS ER 20 MEQ PO TBCR: 20.0000 meq | EXTENDED_RELEASE_TABLET | Freq: Every day | ORAL | 0 refills | Status: AC

## 2019-10-23 MED ORDER — ADULT MULTIVITAMIN W/MINERALS CH: 1.0000 | ORAL_TABLET | Freq: Every day | ORAL | 0 refills | Status: AC

## 2019-10-23 NOTE — Discharge Instructions (Signed)
Follow with Primary MD Gildardo Pounds, NP in 7 days   Get CBC, CMP, 2 view Chest X ray checked  by Primary MD next visit.    Activity: As tolerated with Full fall precautions use walker/cane & assistance as needed   Disposition Home    Diet: regular diet  For Heart failure patients - Check your Weight same time everyday, if you gain over 2 pounds, or you develop in leg swelling, experience more shortness of breath or chest pain, call your Primary MD immediately. Follow Cardiac Low Salt Diet and 1.5 lit/day fluid restriction.   On your next visit with your primary care physician please Get Medicines reviewed and adjusted.   Please request your Prim.MD to go over all Hospital Tests and Procedure/Radiological results at the follow up, please get all Hospital records sent to your Prim MD by signing hospital release before you go home.   If you experience worsening of your admission symptoms, develop shortness of breath, life threatening emergency, suicidal or homicidal thoughts you must seek medical attention immediately by calling 911 or calling your MD immediately  if symptoms less severe.  You Must read complete instructions/literature along with all the possible adverse reactions/side effects for all the Medicines you take and that have been prescribed to you. Take any new Medicines after you have completely understood and accpet all the possible adverse reactions/side effects.   Do not drive, operating heavy machinery, perform activities at heights, swimming or participation in water activities or provide baby sitting services, and please take showers, no bath in a tub,until you have seen by Primary MD or a Neurologist and advised to do so again.  Do not drive when taking Pain medications.    Do not take more than prescribed Pain, Sleep and Anxiety Medications  Special Instructions: If you have smoked or chewed Tobacco  in the last 2 yrs please stop smoking, stop any regular  Alcohol  and or any Recreational drug use.  Wear Seat belts while driving.   Please note  You were cared for by a hospitalist during your hospital stay. If you have any questions about your discharge medications or the care you received while you were in the hospital after you are discharged, you can call the unit and asked to speak with the hospitalist on call if the hospitalist that took care of you is not available. Once you are discharged, your primary care physician will handle any further medical issues. Please note that NO REFILLS for any discharge medications will be authorized once you are discharged, as it is imperative that you return to your primary care physician (or establish a relationship with a primary care physician if you do not have one) for your aftercare needs so that they can reassess your need for medications and monitor your lab values.

## 2019-10-23 NOTE — TOC Initial Note (Signed)
Transition of Care [facility]) - Initial/Assessment Note    Patient Details  Name: Christie Morrison MRN: [id number] Date of Birth: Jun 27, 1955  Transition of [facility]) CM/SW Contact:    Benard Halsted, LCSW Phone Number: 10/23/2019, 3:22 PM  Clinical Narrative:                   Expected Discharge Plan: Home/Self Care Barriers to Discharge: Continued Medical Work up   Patient Goals and CMS Choice  CSW received consult to offer substance use resources. CSW spoke with patient. Patient requested assistance with applying for Medicaid and Food stamps. CSW placed Medicaid info on AVS and printed Food Stamp application for her. Patient denied concerns with substance use and stated that she does not do illegal substances as often now, though she did accept resources just in case she changes her mind. She stated she has arranged a ride home for later today. CSW reminded patient to follow up with [facility] to obtain her medications. No other concerns identified.         Expected Discharge Plan and Services Expected Discharge Plan: Home/Self Care In-house Referral: Clinical Social Work                                            Prior Living Arrangements/Services                       Activities of Daily Living Home Assistive Devices/Equipment: None ADL Screening (condition at time of admission) Patient's cognitive ability adequate to safely complete daily activities?: No Is the patient deaf or have difficulty hearing?: No Does the patient have difficulty seeing, even when wearing glasses/contacts?: No Does the patient have difficulty concentrating, remembering, or making decisions?: No Patient able to express need for assistance with ADLs?: Yes Does the patient have difficulty dressing or bathing?: No Independently performs ADLs?: Yes (appropriate for developmental age) Does the patient have difficulty walking or climbing stairs?: No Weakness of  Legs: None Weakness of Arms/Hands: None  Permission Sought/Granted                  Emotional Assessment     Affect (typically observed): Accepting, Appropriate Orientation: : Oriented to Self, Oriented to Place, Oriented to  Time, Oriented to Situation Alcohol / Substance Use: Illicit Drugs Psych Involvement: No (comment)  Admission diagnosis:  Hypomagnesemia [E83.42] Seizure ([place]) [R56.9] Alcohol withdrawal seizure ([place]) [Q46.962, R56.9] Alcohol withdrawal syndrome with complication [facility]) [X52.841] Patient Active Problem List   Diagnosis Date Noted  . Diarrhea 10/22/2019  . Elevated liver enzymes 10/22/2019  . Lobar pneumonia ([place]) 05/01/2019  . Sepsis ([place]) 04/29/2019  . Alcohol withdrawal seizure ([place]) 04/29/2019  . Leukocytosis 04/27/2019  . Splenic infarct   . Klebsiella infection 04/22/2019  . Hypocalcemia 04/16/2019  . Hypomagnesemia 04/16/2019  . Prolonged QT interval 04/16/2019  . Macrocytic anemia 04/16/2019  . Hyponatremia 04/16/2019  . AKI (acute kidney injury) ([place]) 04/16/2019  . Hypokalemia 10/15/2018  . Cirrhosis, alcoholic ([place])   . Acute hepatitis   . Dehydration, severe 06/08/2016  . Elevated lactic acid level 06/08/2016  . Alcohol abuse 06/08/2016  . Melena 06/08/2016  . Cocaine abuse ([place]) 06/08/2016  . Thrombocytopenia ([place]) 06/08/2016  . Acute hypernatremia 06/08/2016  . Acute alcohol intoxication ([place]) 06/08/2016   PCP:  Gildardo Pounds, NP Pharmacy:  [facility], [place] [address] [place] [address] Phone: [phone] Fax: [place], [place] - [address] [place] [address] Phone: [phone] Fax: [phone]     Social Determinants of Health (SDOH) Interventions    Readmission Risk Interventions Readmission Risk Prevention Plan 10/16/2018  Transportation Screening Complete  PCP or  Specialist Appt within 3-5 Days (No Data)  Palliative Care Screening Not Applicable  Medication Review (RN Care Manager) Referral to Pharmacy  Some recent data might be hidden

## 2019-10-23 NOTE — Progress Notes (Signed)
Christie Morrison to be D/C'd Home per MD order.  Discussed with the patient and all questions fully answered.  VSS, Skin clean, dry and intact without evidence of skin break down, no evidence of skin tears noted. IV catheter discontinued intact. Site without signs and symptoms of complications. Dressing and pressure applied.  An After Visit Summary was printed and given to the patient. Patient received prescription.  D/c education completed with patient including follow up instructions, medication list, d/c activities limitations if indicated, with other d/c instructions as indicated by MD - patient able to verbalize understanding, all questions fully answered.   Patient instructed to return to ED, call 911, or call MD for any changes in condition.   Patient escorted via IXL, and D/C home via private auto.  Jeanella Craze 10/23/2019 6:33 PM

## 2019-10-23 NOTE — Discharge Summary (Addendum)
Christie Morrison, is a 64 y.o. female  DOB 12-21-1955  MRN [id number].  Admission date:  10/22/2019  Admitting Physician  Christie Leff, MD  Discharge Date:  10/23/2019   Primary MD  Christie Pounds, NP  Recommendations for primary care physician for things to follow:  -Check CBC, CMP during next visit.   Admission Diagnosis  Hypomagnesemia [E83.42] Seizure ([place]) [R56.9] Alcohol withdrawal seizure ([place]) [R94.585, R56.9] Alcohol withdrawal syndrome with complication [facility]) [F29.244]   Discharge Diagnosis  Hypomagnesemia [E83.42] Seizure ([place]) [R56.9] Alcohol withdrawal seizure ([place]) [Q28.638, R56.9] Alcohol withdrawal syndrome with complication ([place]) [T77.116]   Principal Problem:   Alcohol withdrawal seizure ([place]) Active Problems:   Alcohol abuse   Cocaine abuse (HCC)   Thrombocytopenia (H[facility])   Hypocalcemia   Hypomagnesemia   Prolonged QT interval   Macrocytic anemia   Leukocytosis   Diarrhea   Elevated liver enzymes      Past Medical History:  Diagnosis Date  . Acute hepatitis   . Cirrhosis ([place])   . Crack cocaine use   . Duodenitis   . ETOH abuse   . High cholesterol   . Hypertension   . Mallory-Weiss tear   . Seizures ([place])     Past Surgical History:  Procedure Laterality Date  . BUBBLE STUDY  04/28/2019   Procedure: BUBBLE STUDY;  Surgeon: Elouise Munroe, MD;  Location: [place];  Service: Cardiology;;  . [place]    . TEE WITHOUT CARDIOVERSION N/A 04/28/2019   Procedure: TRANSESOPHAGEAL ECHOCARDIOGRAM (TEE);  Surgeon: Elouise Munroe, MD;  Location: [facility];  Service: Cardiology;  Laterality: N/A;       History of present illness and  Hospital Course:     Kindly see H&P for history of present illness and admission details, please review complete Labs, Consult reports and Test reports for all details in brief  HPI  from the history and  physical done on the day of admission 10/22/2019  HPI: Christie Morrison is a 64 y.o. female with medical history significant of alcoholic cirrhosis, alcohol abuse with previous alcohol-related seizures, hypomagnesemia, hypokalemia, and cocaine abuse who presents after having had a seizure.  Patient has no recollection of the events, but was reportedly witnessed by her son who is sleeping in the bed next to her.  She was reported to have full body convulsions estimated to have the last 5 to 10 minutes.  Seizure patient was reported to be postictal.  She admits that she has had previous seizures in the past related to alcohol use.  Only she drinks a couple shots of vodka per day on average.  Denies having any nausea, vomiting, abdominal pain, fever, headache, chills, body aches, or recent sick contacts.  She reports that she has lost approximately 30 Morrison of weight but is not sure why and has had some diarrhea.  ED Course: On admission into the emergency department patient was seen to be afebrile, pulse 98-1 02, respirations 15-30, blood pressures elevated up to 172/115, and O2  saturations currently maintained on room air.  CT scan of the brain did not show any acute findings.  Labs significant for WBC 15.8, hemoglobin 10.2 with MCV 101, platelets 128, CO2 21, anion gap 25, magnesium 0.7 alkaline phosphatase 141, AST 69, ALT 22,  total bilirubin 1.4, and alcohol level 19.  UDS positive for cocaine.  Chest x-ray was noted clear.  Urinalysis did not show any acute signs of infection.  Case has been discussed with neurology who  recommended loading patient with 1 g of Keppra.  She also received 2 g of magnesium sulfate, 500 mL normal saline IV fluids, and started on CIWA protocols.   Hospital Course   Alcohol withdrawal seizure -Patient had alcohol withdrawal seizures, patient had several episodes presenting with seizure secondary to alcohol withdrawal. -I have discussed with neurology, no indication for  epileptic medications the setting of alcohol withdrawal seizures, I have discussed with the patient about seizure precautions. -As well discussed with the patient, if she decided to abstain from drinking, to do it in a gradual approach so she does not have any seizures.  Leukocytosis: -  WBC elevated at 15.8.  Suspect this is reactive in nature to the patient acute seizure.  Chest x-ray and urinalysis negative for any clear signs of infection. -Resolved, was 10.2 this morning  Prolonged QT: Acute on chronic.  QT prolonged at 517.  Likely in the setting of electrolyte abnormalities with hypokalemia and hypomagnesemia, which has been repleted, repeat EKG on discharge showing normal QTC.  Hypomagnesemia/hypokalemia -repleted  Hypocalcemia:  - repleted   Macrocytic anemia: -Daily vitamin B12 replacement  Alcoholic cirrhosis: -Is awake alert x3, appropriate, was counseled about alcohol abuse.  Elevated liver enzymes, hyperbilirubinemia: Acute on chronic.   -Can [place] to liver disease  Thrombocytopenia: Acute on chronic.   -Consult alcohol abuse and liver disease  Abuse -She was kept on CIWA protocol during hospital stay, no Ativan requirement, she was counseled, continue with thiamine and folic acid on discharge  Polysubstance abuse: Patient noted to be positive on screen for cocaine. -Continue to counsel on need of cessation of cocaine use   Discharge Condition:  -Stable   Follow UP  Follow-up Information    Department of Social Services. Call.   Why: To Request a Medicaid/Foodstamp Application Contact information: [phone]       Christie Pounds, NP Follow up in 1 week(s).   Specialty: Nurse Practitioner Contact information: [place] [address] [phone]             Discharge Instructions  and  Discharge Medications    Discharge Instructions    Discharge instructions   Complete by: As directed    Follow with Primary MD  Christie Pounds, NP in 7 days   Get CBC, CMP, 2 view Chest X ray checked  by Primary MD next visit.    Activity: As tolerated with Full fall precautions use walker/cane & assistance as needed   Disposition Home    Diet: regular diet  For Heart failure patients - Check your Weight same time everyday, if you gain over 2 Morrison, or you develop in leg swelling, experience more shortness of breath or chest pain, call your Primary MD immediately. Follow Cardiac Low Salt Diet and 1.5 lit/day fluid restriction.   On your next visit with your primary care physician please Get Medicines reviewed and adjusted.   Please request your Prim.MD to go over all Hospital Tests and Procedure/Radiological results at the  follow up, please get all Hospital records sent to your Prim MD by signing hospital release before you go home.   If you experience worsening of your admission symptoms, develop shortness of breath, life threatening emergency, suicidal or homicidal thoughts you must seek medical attention immediately by calling 911 or calling your MD immediately  if symptoms less severe.  You Must read complete instructions/literature along with all the possible adverse reactions/side effects for all the Medicines you take and that have been prescribed to you. Take any new Medicines after you have completely understood and accpet all the possible adverse reactions/side effects.   Do not drive, operating heavy machinery, perform activities at heights, swimming or participation in water activities or provide baby sitting services, and please take showers, no bath in a tub,until you have seen by Primary MD or a Neurologist and advised to do so again.  Do not drive when taking Pain medications.    Do not take more than prescribed Pain, Sleep and Anxiety Medications  Special Instructions: If you have smoked or chewed Tobacco  in the last 2 yrs please stop smoking, stop any regular Alcohol  and or any  Recreational drug use.  Wear Seat belts while driving.   Please note  You were cared for by a hospitalist during your hospital stay. If you have any questions about your discharge medications or the care you received while you were in the hospital after you are discharged, you can call the unit and asked to speak with the hospitalist on call if the hospitalist that took care of you is not available. Once you are discharged, your primary care physician will handle any further medical issues. Please note that NO REFILLS for any discharge medications will be authorized once you are discharged, as it is imperative that you return to your primary care physician (or establish a relationship with a primary care physician if you do not have one) for your aftercare needs so that they can reassess your need for medications and monitor your lab values.   Increase activity slowly   Complete by: As directed    Other Restrictions   Complete by: As directed    Do not drive, operating heavy machinery, perform activities at heights, swimming or participation in water activities or provide baby sitting services, and please take showers, no bath in a tub,until you have seen by Primary MD or a Neurologist and advised to do so again.     Allergies as of 10/23/2019   No Known Allergies     Medication List    STOP taking these medications   dextromethorphan-guaiFENesin 30-600 MG 12hr tablet Commonly known as: MUCINEX DM     TAKE these medications   cyanocobalamin 100 MCG tablet Take 1 tablet (100 mcg total) by mouth daily. Start taking on: October 24, 2723   folic acid 1 MG tablet Commonly known as: FOLVITE Take 1 tablet (1 mg total) by mouth daily. Start taking on: October 24, 2019   Magnesium Oxide 400 (240 Mg) MG Tabs Take 1 tablet (400 mg total) by mouth daily.   multivitamin with minerals Tabs tablet Take 1 tablet by mouth daily.   potassium chloride SA 20 MEQ tablet Commonly known as: KLOR-CON Take  1 tablet (20 mEq total) by mouth daily.   thiamine 100 MG tablet Take 1 tablet (100 mg total) by mouth daily.         Diet and Activity recommendation: See Discharge Instructions above   Consults obtained -  none   Major procedures and Radiology Reports - PLEASE review detailed and final reports for all details, in brief -      CT HEAD WO CONTRAST  Result Date: 10/22/2019 CLINICAL DATA:  Recent seizure activity EXAM: CT HEAD WITHOUT CONTRAST TECHNIQUE: Contiguous axial images were obtained from the base of the skull through the vertex without intravenous contrast. COMPARISON:  04/16/2019 FINDINGS: Brain: Mild atrophic changes and chronic white matter ischemic change is seen. No findings to suggest acute hemorrhage, acute infarction or space-occupying mass lesion are noted. Vascular: No hyperdense vessel or unexpected calcification. Skull: Normal. Negative for fracture or focal lesion. Sinuses/Orbits: No acute finding. Other: None. IMPRESSION: Chronic atrophic and ischemic changes without acute abnormality. Electronically Signed   By: Inez Catalina M.D.   On: 10/22/2019 03:51   DG Chest Portable 1 View  Result Date: 10/22/2019 CLINICAL DATA:  Seizure and lymphocytosis EXAM: PORTABLE CHEST 1 VIEW COMPARISON:  April 30, 2019 FINDINGS: The heart size and mediastinal contours are within normal limits. Aortic knob calcifications are seen. Both lungs are clear. The visualized skeletal structures are unremarkable. IMPRESSION: No active disease. Electronically Signed   By: Prudencio Pair M.D.   On: 10/22/2019 04:16    Micro Results    Recent Results (from the past 240 hour(s))  SARS Coronavirus 2 by RT PCR (hospital order, performed in [facility] hospital lab) Nasopharyngeal Nasopharyngeal Swab     Status: None   Collection Time: 10/22/19  5:33 AM   Specimen: Nasopharyngeal Swab  Result Value Ref Range Status   SARS Coronavirus 2 NEGATIVE NEGATIVE Final    Comment: (NOTE) SARS-CoV-2  target nucleic acids are NOT DETECTED. The SARS-CoV-2 RNA is generally detectable in upper and lower respiratory specimens during the acute phase of infection. The lowest concentration of SARS-CoV-2 viral copies this assay can detect is 250 copies / mL. A negative result does not preclude SARS-CoV-2 infection and should not be used as the sole basis for treatment or other patient management decisions.  A negative result may occur with improper specimen collection / handling, submission of specimen other than nasopharyngeal swab, presence of viral mutation(s) within the areas targeted by this assay, and inadequate number of viral copies (<250 copies / mL). A negative result must be combined with clinical observations, patient history, and epidemiological information. Fact Sheet for Patients:   StrictlyIdeas.no Fact Sheet for Healthcare Providers: BankingDealers.co.za This test is not yet approved or cleared  by the [place] FDA and has been authorized for detection and/or diagnosis of SARS-CoV-2 by FDA under an Emergency Use Authorization (EUA).  This EUA will remain in effect (meaning this test can be used) for the duration of the COVID-19 declaration under Section 564(b)(1) of the Act, 21 U.S.C. section 360bbb-3(b)(1), unless the authorization is terminated or revoked sooner. Performed at [facility], [place] [address] [place], [place] [address]   MRSA PCR Screening     Status: None   Collection Time: 10/22/19  1:51 PM   Specimen: Nasopharyngeal  Result Value Ref Range Status   MRSA by PCR NEGATIVE NEGATIVE Final    Comment:        The GeneXpert MRSA Assay (FDA approved for NASAL specimens only), is one component of a comprehensive MRSA colonization surveillance program. It is not intended to diagnose MRSA infection nor to guide or monitor treatment for MRSA infections. Performed at [facility], [place] [address] [place], [place] [address]   C Difficile Quick Screen  w PCR reflex     Status: None   Collection Time: 10/22/19  5:49 PM   Specimen: STOOL  Result Value Ref Range Status   C Diff antigen NEGATIVE NEGATIVE Final   C Diff toxin NEGATIVE NEGATIVE Final   C Diff interpretation No C. difficile detected.  Final    Comment: Performed at [facility], [place] [address] [place], [place] [address]       Today   Subjective:   Christie Morrison today has no headache,no chest or  abdominal pain,no new weakness tingling or numbness, since admissions.  Objective:   Blood pressure (!) 153/92, pulse 84, temperature 98.2 F (36.8 C), temperature source Oral, resp. rate 20, height 5\' 10"  (1.778 m), weight 57.7 kg, SpO2 100 %.   Intake/Output Summary (Last 24 hours) at 10/23/2019 1553 Last data filed at 10/22/2019 1900 Gross per 24 hour  Intake 335 ml  Output 1 ml  Net 334 ml    Exam Awake Alert, Oriented x 3, No new F.N deficits, Normal affect, appropriate, no tremors, no signs of withdrawals. Symmetrical Chest wall movement, Good air movement bilaterally, CTAB RRR,No Gallops,Rubs or new Murmurs, No Parasternal Heave +ve B.Sounds, Abd Soft, Non tender, No organomegaly appriciated, No rebound -guarding or rigidity. No Cyanosis, Clubbing or edema, No new Rash or bruise  Data Review   CBC w Diff:  Lab Results  Component Value Date   WBC 10.2 10/23/2019   HGB 8.8 (L) 10/23/2019   HGB 8.6 (L) 05/20/2019   HCT 26.8 (L) 10/23/2019   HCT 30.9 (L) 06/23/2019   PLT 108 (L) 10/23/2019   PLT 408 05/20/2019   LYMPHOPCT 7 10/22/2019   MONOPCT 5 10/22/2019   EOSPCT 1 10/22/2019   BASOPCT 0 10/22/2019    CMP:  Lab Results  Component Value Date   NA 138 10/23/2019   NA 137 05/20/2019   K 2.8 (L) 10/23/2019   CL 97 (L) 10/23/2019   CO2 26 10/23/2019   BUN 8 10/23/2019   BUN 4 (L) 05/20/2019   CREATININE 1.19 (H) 10/23/2019   CREATININE 1.30 (H) 06/23/2019   PROT 6.3 (L) 10/23/2019    PROT 8.1 05/20/2019   ALBUMIN 3.3 (L) 10/23/2019   ALBUMIN 3.5 (L) 05/20/2019   BILITOT 1.0 10/23/2019   BILITOT 0.5 06/23/2019   ALKPHOS 119 10/23/2019   AST 62 (H) 10/23/2019   AST 46 (H) 06/23/2019   ALT 17 10/23/2019   ALT 15 06/23/2019   ALT BLOOD 06/08/2016  .   Total Time in preparing paper work, data evaluation and todays exam - 66 minutes  Phillips Climes M.D on 10/23/2019 at 3:53 PM  [facility]   Office  [phone]

## 2019-10-26 ENCOUNTER — Telehealth: Payer: Self-pay

## 2019-10-26 NOTE — Telephone Encounter (Signed)
Transition Care Management Follow-up Telephone Call  Date of discharge and from where: 10/23/2019, [facility]   How have you been since you were released from the hospital? She said she is " fine."   Any questions or concerns?  none reported at this time  Explained to her that she can meet with the financial counselor to discuss [employer]  and [employer].  She has not applied for medicaid.    Items Reviewed:  Did the pt receive and understand the discharge instructions provided?yes she has the instructions and did not have any questions.   Medications obtained and verified?  She said she has the medications and did not have any questions or want to review the medication list.  At patient's request, potassium  order reviewed.   Any new allergies since your discharge? none reported   Do you have support at home?  has support from her son.   Other (ie: DME, Home Health, etc) no home health or DME ordered  Functional Questionnaire: (I = Independent and D = Dependent) ADL's: independent   Follow up appointments reviewed:    PCP Hospital f/u appt confirmed?[place] walk in provider - 11/12/2019 @ [facility] f/u appt confirmed?none scheduled at this time  Are transportation arrangements needed?  [place]  If their condition worsens, is the pt aware to call  their PCP or go to the ED?  yes  Was the patient provided with contact information for the PCP's office or ED?  she has the phone number for the clinic  Was the pt encouraged to call back with questions or concerns? yes

## 2019-11-12 ENCOUNTER — Ambulatory Visit: Payer: Self-pay

## 2019-12-03 ENCOUNTER — Ambulatory Visit: Payer: Self-pay | Admitting: Physician Assistant

## 2019-12-09 ENCOUNTER — Other Ambulatory Visit: Payer: Self-pay

## 2019-12-09 ENCOUNTER — Emergency Department (HOSPITAL_COMMUNITY)
Admission: EM | Admit: 2019-12-09 | Discharge: 2019-12-10 | Disposition: A | Discharge status: Home / Self Care | Payer: Self-pay | Attending: Emergency Medicine | Admitting: Emergency Medicine

## 2019-12-09 ENCOUNTER — Emergency Department (HOSPITAL_COMMUNITY): Payer: Self-pay

## 2019-12-09 ENCOUNTER — Encounter (HOSPITAL_COMMUNITY): Payer: Self-pay | Admitting: Emergency Medicine

## 2019-12-09 DIAGNOSIS — F19230 Other psychoactive substance dependence with withdrawal, uncomplicated: Secondary | ICD-10-CM | POA: Insufficient documentation

## 2019-12-09 DIAGNOSIS — E612 Magnesium deficiency: Secondary | ICD-10-CM | POA: Insufficient documentation

## 2019-12-09 DIAGNOSIS — R55 Syncope and collapse: Secondary | ICD-10-CM | POA: Insufficient documentation

## 2019-12-09 DIAGNOSIS — Z79899 Other long term (current) drug therapy: Secondary | ICD-10-CM | POA: Insufficient documentation

## 2019-12-09 DIAGNOSIS — I10 Essential (primary) hypertension: Secondary | ICD-10-CM | POA: Insufficient documentation

## 2019-12-09 LAB — BASIC METABOLIC PANEL
Anion gap: 18 — ABNORMAL HIGH (ref 5–15)
BUN: 24 mg/dL — ABNORMAL HIGH (ref 8–23)
CO2: 21 mmol/L — ABNORMAL LOW (ref 22–32)
Calcium: 9.7 mg/dL (ref 8.9–10.3)
Chloride: 99 mmol/L (ref 98–111)
Creatinine, Ser: 1.7 mg/dL — ABNORMAL HIGH (ref 0.44–1.00)
GFR calc Af Amer: 37 mL/min — ABNORMAL LOW (ref >60)
GFR calc non Af Amer: 32 mL/min — ABNORMAL LOW (ref >60)
Glucose, Bld: 88 mg/dL (ref 70–99)
Potassium: 4.2 mmol/L (ref 3.5–5.1)
Sodium: 138 mmol/L (ref 135–145)

## 2019-12-09 LAB — CBC
HCT: 36.7 % (ref 36.0–46.0)
Hemoglobin: 11.8 g/dL — ABNORMAL LOW (ref 12.0–15.0)
MCH: 33.8 pg (ref 26.0–34.0)
MCHC: 32.2 g/dL (ref 30.0–36.0)
MCV: 105.2 fL — ABNORMAL HIGH (ref 80.0–100.0)
Platelets: 160 10*3/uL (ref 150–400)
RBC: 3.49 MIL/uL — ABNORMAL LOW (ref 3.87–5.11)
RDW: 14.5 % (ref 11.5–15.5)
WBC: 9.5 10*3/uL (ref 4.0–10.5)
nRBC: 0 % (ref 0.0–0.2)

## 2019-12-09 LAB — URINALYSIS, ROUTINE W REFLEX MICROSCOPIC
Bilirubin Urine: NEGATIVE
Glucose, UA: NEGATIVE mg/dL
Hgb urine dipstick: NEGATIVE
Ketones, ur: NEGATIVE mg/dL
Nitrite: NEGATIVE
Protein, ur: 100 mg/dL — AB
Specific Gravity, Urine: 1.017 (ref 1.005–1.030)
pH: 6 (ref 5.0–8.0)

## 2019-12-09 MED ORDER — SODIUM CHLORIDE 0.9% FLUSH: 3.0000 mL | Freq: Once | INTRAVENOUS | Status: DC

## 2019-12-09 NOTE — ED Triage Notes (Addendum)
Per [facility] pt here for witnessed grand mal seizure by son. Pt was seen having full body shaking for 30 seconds this afternoon. Son reported patient did not hit head or neck. Patient denies memory of seizure but is alert & oriented x4 at this moment. Patient states she drinks over a pint of ETOH per day and has not drank in over 24hrs. Last crack/cocaine use 3 days.

## 2019-12-10 LAB — HEPATIC FUNCTION PANEL
ALT: 27 U/L (ref 0–44)
AST: 83 U/L — ABNORMAL HIGH (ref 15–41)
Albumin: 4.5 g/dL (ref 3.5–5.0)
Alkaline Phosphatase: 131 U/L — ABNORMAL HIGH (ref 38–126)
Bilirubin, Direct: 0.3 mg/dL — ABNORMAL HIGH (ref 0.0–0.2)
Indirect Bilirubin: 0.4 mg/dL (ref 0.3–0.9)
Total Bilirubin: 0.7 mg/dL (ref 0.3–1.2)
Total Protein: 8.2 g/dL — ABNORMAL HIGH (ref 6.5–8.1)

## 2019-12-10 LAB — RAPID URINE DRUG SCREEN, HOSP PERFORMED
Amphetamines: NOT DETECTED
Barbiturates: NOT DETECTED
Benzodiazepines: NOT DETECTED
Cocaine: POSITIVE — AB
Opiates: NOT DETECTED
Tetrahydrocannabinol: NOT DETECTED

## 2019-12-10 LAB — MAGNESIUM: Magnesium: 1.2 mg/dL — ABNORMAL LOW (ref 1.7–2.4)

## 2019-12-10 MED ORDER — MAGNESIUM SULFATE 2 GM/50ML IV SOLN
2.0000 g | Freq: Once | INTRAVENOUS | Status: AC
  Administered 2019-12-10: 2 g via INTRAVENOUS
  Filled 2019-12-10: qty 50

## 2019-12-10 NOTE — ED Notes (Signed)
Called for Pt to update vitals. No answer. Pt also not currently seen in ER

## 2019-12-10 NOTE — Discharge Instructions (Addendum)
You were seen here for near syncopal episode.  Labs and imaging all look reassuring.  You were a little dehydrated on examination I recommend that you drink plenty of fluids, recommend that you intake 64 ounces of fluids a day.  Please  stay away from things that will dehydrate you like caffeinated beverages, sugary drinks, alcohol.  I also want to continue to take your at home medications as prescribed especially your magnesium.  I have given you contact information for [facility] wellness they work with individuals with little to no insurance and they can help provide you a primary care provider.  I want you to call them at your earliest convenience as I feel you will need further evaluation management of your chronic diseases.  I want to come back to the emergency department if you develop chest pain, shortness of breath, severe abdominal pain, lower back pain, uncontrolled nausea, vomiting, diarrhea as the symptoms require further evaluation management.

## 2019-12-10 NOTE — ED Provider Notes (Signed)
[facility] EMERGENCY DEPARTMENT Provider Note   CSN: [id number] Arrival date & time: 12/09/19  1623     History Chief Complaint  Patient presents with  . Seizures  . Withdrawal    Christie Morrison is a 64 y.o. female.  HPI   Patient presents to the emergency department with chief complaint of syncopal episode that happened yesterday while she was at work.  Patient explains while she was working at the [employer] she felt lightheaded and went to sit down and then remembers waking up and people all around her.  She does not remember falling, or the events happen that happened after she passed out.  She denies headache, visual changes, nausea, vomiting, paresthesias in extremities, weakness in extremities, chest pain or shortness of breath, neck tenderness.  Patient explained that this is happened to her in the past stating that she just passes out sometimes.  Patient has alcohol use disorder and alcoholic withdrawal seizures and was admitted on 06/04 due to hypomagnesium and alcoholic withdrawal with seizure.  Patient was discussed with neurology at that time does not feel that she needs epileptic medication.  Patient has no complaints at this time, she has significant medical history of acute hepatitis, polysubstance use, hypertension, seizures.  Patient denies headache, fever, chills, shortness of breath, chest pain, vomiting, dysuria, pedal edema.  Past Medical History:  Diagnosis Date  . Acute hepatitis   . Cirrhosis ([place])   . Crack cocaine use   . Duodenitis   . ETOH abuse   . High cholesterol   . Hypertension   . Mallory-Weiss tear   . Seizures [facility])     Patient Active Problem List   Diagnosis Date Noted  . Diarrhea 10/22/2019  . Elevated liver enzymes 10/22/2019  . Lobar pneumonia ([place]) 05/01/2019  . Sepsis ([place]) 04/29/2019  . Alcohol withdrawal seizure ([place]) 04/29/2019  . Leukocytosis 04/27/2019  . Splenic infarct   . Klebsiella infection  04/22/2019  . Hypocalcemia 04/16/2019  . Hypomagnesemia 04/16/2019  . Prolonged QT interval 04/16/2019  . Macrocytic anemia 04/16/2019  . Hyponatremia 04/16/2019  . AKI (acute kidney injury) ([place]) 04/16/2019  . Hypokalemia 10/15/2018  . Cirrhosis, alcoholic ([place])   . Acute hepatitis   . Dehydration, severe 06/08/2016  . Elevated lactic acid level 06/08/2016  . Alcohol abuse 06/08/2016  . Melena 06/08/2016  . Cocaine abuse ([place]) 06/08/2016  . Thrombocytopenia ([place]) 06/08/2016  . Acute hypernatremia 06/08/2016  . Acute alcohol intoxication ([place]) 06/08/2016    Past Surgical History:  Procedure Laterality Date  . BUBBLE STUDY  04/28/2019   Procedure: BUBBLE STUDY;  Surgeon: Elouise Munroe, MD;  Location: [place];  Service: Cardiology;;  . [place]    . TEE WITHOUT CARDIOVERSION N/A 04/28/2019   Procedure: TRANSESOPHAGEAL ECHOCARDIOGRAM (TEE);  Surgeon: Elouise Munroe, MD;  Location: [place];  Service: Cardiology;  Laterality: N/A;     OB History   No obstetric history on file.     Family History  Problem Relation Age of Onset  . Diabetes Mellitus II Mother   . Kidney failure Mother   . Alcohol abuse Mother   . Diabetes Mellitus II Brother   . Kidney failure Brother   . Alcohol abuse Maternal Grandmother   . Hypertension Other     Social History   Tobacco Use  . Smoking status: Never Smoker  . Smokeless tobacco: Never Used  Vaping Use  . Vaping Use: Never used  Substance Use Topics  .  Alcohol use: Yes    Comment: occasionally   . Drug use: Not Currently    Frequency: 1.0 times per week    Types: Cocaine    Home Medications Prior to Admission medications   Medication Sig Start Date End Date Taking? Authorizing Provider  Magnesium Oxide 400 (240 Mg) MG TABS Take 1 tablet (400 mg total) by mouth daily. 10/23/19  Yes Elgergawy, Silver Huguenin, MD  potassium chloride SA (KLOR-CON) 20 MEQ tablet Take 1 tablet (20 mEq total) by mouth daily. 10/23/19   Yes Elgergawy, Silver Huguenin, MD  folic acid (FOLVITE) 1 MG tablet Take 1 tablet (1 mg total) by mouth daily. Patient not taking: Reported on 12/10/2019 10/24/19   Elgergawy, Silver Huguenin, MD  Multiple Vitamin (MULTIVITAMIN WITH MINERALS) TABS tablet Take 1 tablet by mouth daily. Patient not taking: Reported on 12/10/2019 10/23/19   Elgergawy, Silver Huguenin, MD  thiamine 100 MG tablet Take 1 tablet (100 mg total) by mouth daily. Patient not taking: Reported on 12/10/2019 10/23/19   Elgergawy, Silver Huguenin, MD  vitamin B-12 100 MCG tablet Take 1 tablet (100 mcg total) by mouth daily. Patient not taking: Reported on 12/10/2019 10/24/19   Elgergawy, Silver Huguenin, MD    Allergies    Patient has no known allergies.  Review of Systems   Review of Systems  Constitutional: Negative for chills and fever.  HENT: Negative for congestion, trouble swallowing and voice change.   Eyes: Negative for visual disturbance.  Respiratory: Negative for cough and shortness of breath.   Cardiovascular: Negative for chest pain, palpitations and leg swelling.  Gastrointestinal: Negative for abdominal pain, diarrhea, nausea and vomiting.  Genitourinary: Negative for dysuria, enuresis, frequency, pelvic pain, urgency and vaginal discharge.  Musculoskeletal: Negative for back pain and myalgias.  Skin: Negative for rash.  Neurological: Negative for dizziness, weakness, numbness and headaches.  Hematological: Does not bruise/bleed easily.    Physical Exam Updated Vital Signs BP 117/79   Pulse 72   Temp 99 F (37.2 C) (Oral)   Resp 17   Ht 5\' 10"  (1.778 m)   SpO2 100%   BMI 18.25 kg/m   Physical Exam Vitals and nursing note reviewed.  Constitutional:      General: She is not in acute distress.    Appearance: Normal appearance. She is not ill-appearing or diaphoretic.  HENT:     Head: Normocephalic and atraumatic.     Nose: No congestion or rhinorrhea.     Mouth/Throat:     Mouth: Mucous membranes are moist.     Pharynx: Oropharynx  is clear.  Eyes:     General: No visual field deficit or scleral icterus.    Extraocular Movements: Extraocular movements intact.     Conjunctiva/sclera: Conjunctivae normal.     Pupils: Pupils are equal, round, and reactive to light.  Cardiovascular:     Rate and Rhythm: Normal rate and regular rhythm.     Pulses: Normal pulses.     Heart sounds: No murmur heard.  No friction rub. No gallop.   Pulmonary:     Effort: Pulmonary effort is normal. No respiratory distress.     Breath sounds: No wheezing, rhonchi or rales.  Abdominal:     General: There is no distension.     Palpations: Abdomen is soft.     Tenderness: There is no abdominal tenderness. There is no right CVA tenderness, left CVA tenderness or guarding.  Musculoskeletal:        General: No swelling or  tenderness.     Cervical back: No rigidity or tenderness.     Right lower leg: No edema.     Left lower leg: No edema.  Skin:    General: Skin is warm and dry.     Capillary Refill: Capillary refill takes less than 2 seconds.     Findings: No rash.  Neurological:     General: No focal deficit present.     Mental Status: She is alert and oriented to person, place, and time.     GCS: GCS eye subscore is 4. GCS verbal subscore is 5. GCS motor subscore is 6.     Cranial Nerves: Cranial nerves are intact. No cranial nerve deficit or facial asymmetry.     Sensory: Sensation is intact. No sensory deficit.     Motor: Motor function is intact. No weakness or pronator drift.     Coordination: Coordination is intact. Romberg sign negative. Finger-Nose-Finger Test and Heel to [facility] Test normal.  Psychiatric:        Mood and Affect: Mood normal.     ED Results / Procedures / Treatments   Labs (all labs ordered are listed, but only abnormal results are displayed) Labs Reviewed  BASIC METABOLIC PANEL - Abnormal; Notable for the following components:      Result Value   CO2 21 (*)    BUN 24 (*)    Creatinine, Ser 1.70 (*)     GFR calc non Af Amer 32 (*)    GFR calc Af Amer 37 (*)    Anion gap 18 (*)    All other components within normal limits  CBC - Abnormal; Notable for the following components:   RBC 3.49 (*)    Hemoglobin 11.8 (*)    MCV 105.2 (*)    All other components within normal limits  URINALYSIS, ROUTINE W REFLEX MICROSCOPIC - Abnormal; Notable for the following components:   APPearance HAZY (*)    Protein, ur 100 (*)    Leukocytes,Ua LARGE (*)    Bacteria, UA FEW (*)    All other components within normal limits  RAPID URINE DRUG SCREEN, HOSP PERFORMED - Abnormal; Notable for the following components:   Cocaine POSITIVE (*)    All other components within normal limits  HEPATIC FUNCTION PANEL - Abnormal; Notable for the following components:   Total Protein 8.2 (*)    AST 83 (*)    Alkaline Phosphatase 131 (*)    Bilirubin, Direct 0.3 (*)    All other components within normal limits  MAGNESIUM - Abnormal; Notable for the following components:   Magnesium 1.2 (*)    All other components within normal limits  URINE CULTURE  CBG MONITORING, ED    EKG EKG Interpretation  Date/Time:  Wednesday December 09 2019 17:05:49 EDT Ventricular Rate:  81 PR Interval:  126 QRS Duration: 70 QT Interval:  392 QTC Calculation: 455 R Axis:   1 Text Interpretation: Normal sinus rhythm Minimal voltage criteria for LVH, may be normal variant ( R in aVL ) Borderline ECG since last tracing no significant change Confirmed by Daleen Bo [phone]) on 12/10/2019 10:36:27 AM   Radiology CT HEAD WO CONTRAST  Result Date: 12/09/2019 CLINICAL DATA:  Seizure EXAM: CT HEAD WITHOUT CONTRAST TECHNIQUE: Contiguous axial images were obtained from the base of the skull through the vertex without intravenous contrast. COMPARISON:  CT 10/22/2019 FINDINGS: Brain: No evidence of acute infarction, hemorrhage, hydrocephalus, extra-axial collection or mass lesion/mass effect. Symmetric prominence of  the ventricles, cisterns and  sulci compatible with parenchymal volume loss. Patchy areas of white matter hypoattenuation are most compatible with chronic microvascular angiopathy. Benign dural calcifications are present. Midline structures are unremarkable. Cerebellar tonsils are normally positioned. Vascular: Atherosclerotic calcification of the carotid siphons and intradural vertebral arteries. No hyperdense vessel. Skull: No calvarial fracture or suspicious osseous lesion. No scalp swelling or hematoma. Sinuses/Orbits: Remote left lamina papyracea fracture with herniation of fat and mild deviation of the medial rectus similar to prior. Paranasal sinuses are otherwise clear. Mastoid air cells are well aerated. Included orbital structures are unremarkable. Other: None. IMPRESSION: 1. No acute intracranial abnormality. 2. Stable parenchymal volume loss and chronic microvascular angiopathy. 3. Remote left lamina papyracea fracture with herniation of fat and mild deviation of the medial rectus, similar to prior. Electronically Signed   By: Lovena Le M.D.   On: 12/09/2019 18:11    Procedures Procedures (including critical care time)  Medications Ordered in ED Medications  sodium chloride flush (NS) 0.9 % injection 3 mL (has no administration in time range)  magnesium sulfate IVPB 2 g 50 mL (0 g Intravenous Stopped 12/10/19 1820)    ED Course  I have reviewed the triage vital signs and the nursing notes.  Pertinent labs & imaging results that were available during my care of the patient were reviewed by me and considered in my medical decision making (see chart for details).    MDM Rules/Calculators/A&P                          I have personally reviewed all imaging, labs and have interpreted them.  Due to patient complaint most concern for CVA versus metabolic abnormality versus systemic infection versus meningitis.  CT head was performed did not show any acute abnormalities, no signs of intracranial bleed or intracranial  mass.  Unlikely patient suffering from CVA as patient denies headache, loss of balance, there is no focal deficits on examination.   unlikely patient suffering from an metabolic abnormality as BMP does not show electrolyte abnormality but does show increased creatinine of 1.7, patient's baseline is 1.2.  UA showed large leukocytes, white blood cells and few bacteria.  Unlikely patient suffering from UTI as she denies dysuria, flank pain, lower back pain no CVA tenderness on examination and admits that she has not been drinking much water lately.  Likely patient is dehydrated causing AKI and concentrated patients urine giving a false positive of leukocytes.  Will send urine for culture.  Hepatic panel was performed shows stable acute hepatitis.  Urine drug screen showed positive for cocaine.  Patient's magnesium was 1.2 given IV magnesium and instructed to continue taking at home magnesium pills.  Unlikely patient suffering from a systemic infection as CBC does not show leukocytosis.,  Vital signs reassuring.  Unlikely patient suffering from meningitis as she denies any neck pain or tenderness.  Lab works show macrocytic anemia which appears to be at baseline per patient most likely secondary to alcohol abuse.  EKG showed sinus rhythm without signs of ischemia.  Is tolerating p.o. without difficulty, and has no complaints at this time.  Patient appears to be resting calmly in bed showing no acute signs distress.  Vital signs have remained stable does not meet criteria to be admitted to the hospital.  Likely patient's AKI is secondary to dehydration and I recommend that she continue with oral hydration.  I also recommend that she continue to take her magnesium  pills.  Patient was discussed with attending who agrees with assessment and plan.  Patient is given at home care as well as strict return precautions.  Patient verbalized that she understood and agreed with said plan. Final Clinical Impression(s) / ED  Diagnoses Final diagnoses:  Syncope, unspecified syncope type  Magnesium deficiency    Rx / DC Orders ED Discharge Orders    None       Marcello Fennel, PA-C 12/10/19 Marshell Garfinkel, MD 12/11/19 2056

## 2019-12-10 NOTE — ED Notes (Signed)
Called for Pt to update vitals. No answer 

## 2019-12-24 ENCOUNTER — Ambulatory Visit: Payer: Self-pay | Admitting: Physician Assistant

## 2020-06-09 ENCOUNTER — Emergency Department (HOSPITAL_COMMUNITY): Payer: Self-pay

## 2020-06-09 ENCOUNTER — Emergency Department (HOSPITAL_COMMUNITY)
Admission: EM | Admit: 2020-06-09 | Discharge: 2020-06-09 | Disposition: A | Discharge status: Home / Self Care | Payer: Self-pay | Attending: Emergency Medicine | Admitting: Emergency Medicine

## 2020-06-09 ENCOUNTER — Other Ambulatory Visit: Payer: Self-pay

## 2020-06-09 DIAGNOSIS — W01198A Fall on same level from slipping, tripping and stumbling with subsequent striking against other object, initial encounter: Secondary | ICD-10-CM | POA: Insufficient documentation

## 2020-06-09 DIAGNOSIS — S00511A Abrasion of lip, initial encounter: Secondary | ICD-10-CM | POA: Insufficient documentation

## 2020-06-09 DIAGNOSIS — Y92481 Parking lot as the place of occurrence of the external cause: Secondary | ICD-10-CM | POA: Insufficient documentation

## 2020-06-09 DIAGNOSIS — W19XXXA Unspecified fall, initial encounter: Secondary | ICD-10-CM

## 2020-06-09 DIAGNOSIS — S0083XA Contusion of other part of head, initial encounter: Secondary | ICD-10-CM | POA: Insufficient documentation

## 2020-06-09 DIAGNOSIS — I10 Essential (primary) hypertension: Secondary | ICD-10-CM | POA: Insufficient documentation

## 2020-06-09 DIAGNOSIS — S0031XA Abrasion of nose, initial encounter: Secondary | ICD-10-CM | POA: Insufficient documentation

## 2020-06-09 MED ORDER — ACETAMINOPHEN 500 MG PO TABS
1000.0000 mg | ORAL_TABLET | Freq: Once | ORAL | Status: AC
  Administered 2020-06-09: 1000 mg via ORAL
  Filled 2020-06-09: qty 2

## 2020-06-09 NOTE — ED Triage Notes (Signed)
Pt sts she had a mechanical fall in the [employer] parking lot. Hit face first on the pavement. Hematoma to central forehead and abrasions to nose and lip. Denies neck or back pain and refused C collar. A/O x 2, unknown baseline.

## 2020-06-09 NOTE — ED Provider Notes (Signed)
[facility] EMERGENCY DEPARTMENT Provider Note   CSN: [id number] Arrival date & time: 06/09/20  1733     History Chief Complaint  Patient presents with  . Fall    Christie Morrison is a 65 y.o. female.  Patient is a 65 year old female with a history of hypertension, cirrhosis, alcohol abuse, crack cocaine use and seizures from alcohol withdrawal who is presenting today after a fall.  She reports that she was in the [employer] parking lot and she tripped on some rocks and fell face first onto the pavement.  She denied any loss of consciousness.  She is complaining of a mild headache and facial pain.  No dental complaints.  She denies any neck pain.  She was able to stand and walk after the event and denies any pain in her legs but does have some pain in her right hand.  She does not take any anticoagulation.  She denies any visual changes.  No chest pain or abdominal pain.  The history is provided by the patient.  Fall       Past Medical History:  Diagnosis Date  . Acute hepatitis   . Cirrhosis ([place])   . Crack cocaine use   . Duodenitis   . ETOH abuse   . High cholesterol   . Hypertension   . Mallory-Weiss tear   . Seizures [facility])     Patient Active Problem List   Diagnosis Date Noted  . Diarrhea 10/22/2019  . Elevated liver enzymes 10/22/2019  . Lobar pneumonia ([place]) 05/01/2019  . Sepsis ([place]) 04/29/2019  . Alcohol withdrawal seizure ([place]) 04/29/2019  . Leukocytosis 04/27/2019  . Splenic infarct   . Klebsiella infection 04/22/2019  . Hypocalcemia 04/16/2019  . Hypomagnesemia 04/16/2019  . Prolonged QT interval 04/16/2019  . Macrocytic anemia 04/16/2019  . Hyponatremia 04/16/2019  . AKI (acute kidney injury) ([place]) 04/16/2019  . Hypokalemia 10/15/2018  . Cirrhosis, alcoholic ([place])   . Acute hepatitis   . Dehydration, severe 06/08/2016  . Elevated lactic acid level 06/08/2016  . Alcohol abuse 06/08/2016  . Melena 06/08/2016  . Cocaine abuse ([place])  06/08/2016  . Thrombocytopenia ([place]) 06/08/2016  . Acute hypernatremia 06/08/2016  . Acute alcohol intoxication ([place]) 06/08/2016    Past Surgical History:  Procedure Laterality Date  . BUBBLE STUDY  04/28/2019   Procedure: BUBBLE STUDY;  Surgeon: Elouise Munroe, MD;  Location: [place];  Service: Cardiology;;  . [place]    . TEE WITHOUT CARDIOVERSION N/A 04/28/2019   Procedure: TRANSESOPHAGEAL ECHOCARDIOGRAM (TEE);  Surgeon: Elouise Munroe, MD;  Location: [place];  Service: Cardiology;  Laterality: N/A;     OB History   No obstetric history on file.     Family History  Problem Relation Age of Onset  . Diabetes Mellitus II Mother   . Kidney failure Mother   . Alcohol abuse Mother   . Diabetes Mellitus II Brother   . Kidney failure Brother   . Alcohol abuse Maternal Grandmother   . Hypertension Other     Social History   Tobacco Use  . Smoking status: Never Smoker  . Smokeless tobacco: Never Used  Vaping Use  . Vaping Use: Never used  Substance Use Topics  . Alcohol use: Yes    Comment: occasionally   . Drug use: Not Currently    Frequency: 1.0 times per week    Types: Cocaine    Home Medications Prior to Admission medications   Medication Sig Start Date  End Date Taking? Authorizing Provider  folic acid (FOLVITE) 1 MG tablet Take 1 tablet (1 mg total) by mouth daily. Patient not taking: Reported on 12/10/2019 10/24/19   Elgergawy, Silver Huguenin, MD  Magnesium Oxide 400 (240 Mg) MG TABS Take 1 tablet (400 mg total) by mouth daily. 10/23/19   Elgergawy, Silver Huguenin, MD  Multiple Vitamin (MULTIVITAMIN WITH MINERALS) TABS tablet Take 1 tablet by mouth daily. Patient not taking: Reported on 12/10/2019 10/23/19   Elgergawy, Silver Huguenin, MD  potassium chloride SA (KLOR-CON) 20 MEQ tablet Take 1 tablet (20 mEq total) by mouth daily. 10/23/19   Elgergawy, Silver Huguenin, MD  thiamine 100 MG tablet Take 1 tablet (100 mg total) by mouth daily. Patient not taking: Reported on  12/10/2019 10/23/19   Elgergawy, Silver Huguenin, MD  vitamin B-12 100 MCG tablet Take 1 tablet (100 mcg total) by mouth daily. Patient not taking: Reported on 12/10/2019 10/24/19   Elgergawy, Silver Huguenin, MD    Allergies    Patient has no known allergies.  Review of Systems   Review of Systems  All other systems reviewed and are negative.   Physical Exam Updated Vital Signs BP (!) 149/92   Pulse 95   Temp 97.8 F (36.6 C) (Oral)   Resp 18   SpO2 98%   Physical Exam Vitals and nursing note reviewed.  Constitutional:      General: She is not in acute distress.    Appearance: She is well-developed, normal weight and well-nourished.  HENT:     Head: Normocephalic.      Mouth/Throat:     Mouth: Mucous membranes are moist.     Pharynx: Oropharynx is clear.     Comments: Normal dentition Eyes:     Extraocular Movements: EOM normal.     Pupils: Pupils are equal, round, and reactive to light.  Cardiovascular:     Rate and Rhythm: Normal rate and regular rhythm.     Pulses: Intact distal pulses.     Heart sounds: Normal heart sounds. No murmur heard. No friction rub.  Pulmonary:     Effort: Pulmonary effort is normal.     Breath sounds: Normal breath sounds. No wheezing or rales.  Abdominal:     General: Bowel sounds are normal. There is no distension.     Palpations: Abdomen is soft.     Tenderness: There is no abdominal tenderness. There is no guarding or rebound.  Musculoskeletal:        General: Tenderness present. Normal range of motion.     Right wrist: Normal.     Left wrist: Normal.       Hands:     Cervical back: Normal range of motion and neck supple. No tenderness.     Right hip: Normal.     Left hip: Normal.     Right knee: Normal.     Left knee: Normal.     Right ankle: Normal.     Left ankle: Normal.     Comments: No edema  Skin:    General: Skin is warm and dry.     Findings: No rash.  Neurological:     Mental Status: She is alert and oriented to person,  place, and time.     Cranial Nerves: No cranial nerve deficit.  Psychiatric:        Mood and Affect: Mood and affect normal.        Behavior: Behavior normal.     ED Results / Procedures /  Treatments   Labs (all labs ordered are listed, but only abnormal results are displayed) Labs Reviewed - No data to display  EKG None  Radiology CT Head Wo Contrast  Result Date: 06/09/2020 CLINICAL DATA:  Fall.  Head injury. EXAM: CT HEAD WITHOUT CONTRAST CT MAXILLOFACIAL WITHOUT CONTRAST TECHNIQUE: Multidetector CT imaging of the head and maxillofacial structures were performed using the standard protocol without intravenous contrast. Multiplanar CT image reconstructions of the maxillofacial structures were also generated. COMPARISON:  CT head 11/09/2019 FINDINGS: CT HEAD FINDINGS Brain: Mild atrophy.  Mild white matter changes, unchanged. Negative for acute infarct, hemorrhage, mass. Vascular: Negative for hyperdense vessel. Atherosclerotic calcification. Skull: Negative. Scalp contusion in the anterior frontal region. Associated laceration. Other: None CT MAXILLOFACIAL FINDINGS Osseous: No acute fracture. Chronic fracture left medial orbit unchanged from prior studies. Orbits: Extensive chronic fracture left medial orbit unchanged from prior studies. Orbital fat and medial rectus protrude into the fracture defect which is extensive. No acute orbital fracture. Normal orbital contents. Sinuses: Mild mucosal edema paranasal sinuses. Negative for air-fluid level. Soft tissues: Soft tissue contusion of the forehead IMPRESSION: No acute intracranial abnormality. Negative for acute facial fracture. Extensive chronic fracture left medial orbit. Electronically Signed   By: Franchot Gallo M.D.   On: 06/09/2020 19:02   DG Hand Complete Right  Result Date: 06/09/2020 CLINICAL DATA:  Pain after fall EXAM: RIGHT HAND - COMPLETE 3+ VIEW COMPARISON:  None. FINDINGS: Old appearing fracture deformity involving the fifth  metacarpal. No acute displaced fracture or malalignment. Mild degenerative changes at the first MCP joint. IMPRESSION: No acute osseous abnormality. Old fracture deformity of the fifth metacarpal Electronically Signed   By: Donavan Foil M.D.   On: 06/09/2020 18:51   CT Maxillofacial Wo Contrast  Result Date: 06/09/2020 CLINICAL DATA:  Fall.  Head injury. EXAM: CT HEAD WITHOUT CONTRAST CT MAXILLOFACIAL WITHOUT CONTRAST TECHNIQUE: Multidetector CT imaging of the head and maxillofacial structures were performed using the standard protocol without intravenous contrast. Multiplanar CT image reconstructions of the maxillofacial structures were also generated. COMPARISON:  CT head 11/09/2019 FINDINGS: CT HEAD FINDINGS Brain: Mild atrophy.  Mild white matter changes, unchanged. Negative for acute infarct, hemorrhage, mass. Vascular: Negative for hyperdense vessel. Atherosclerotic calcification. Skull: Negative. Scalp contusion in the anterior frontal region. Associated laceration. Other: None CT MAXILLOFACIAL FINDINGS Osseous: No acute fracture. Chronic fracture left medial orbit unchanged from prior studies. Orbits: Extensive chronic fracture left medial orbit unchanged from prior studies. Orbital fat and medial rectus protrude into the fracture defect which is extensive. No acute orbital fracture. Normal orbital contents. Sinuses: Mild mucosal edema paranasal sinuses. Negative for air-fluid level. Soft tissues: Soft tissue contusion of the forehead IMPRESSION: No acute intracranial abnormality. Negative for acute facial fracture. Extensive chronic fracture left medial orbit. Electronically Signed   By: Franchot Gallo M.D.   On: 06/09/2020 19:02    Procedures Procedures (including critical care time)  Medications Ordered in ED Medications  acetaminophen (TYLENOL) tablet 1,000 mg (has no administration in time range)    ED Course  I have reviewed the triage vital signs and the nursing notes.  Pertinent  labs & imaging results that were available during my care of the patient were reviewed by me and considered in my medical decision making (see chart for details).    MDM Rules/Calculators/A&P                          Patient presenting today after  a fall where she fell face first into the pavement.  She has signs of trauma to her forehead, nose and upper lip.  She denies any loss of consciousness and does not take anticoagulation.  She has no neck pain on exam.  Lower extremities seem normal.  Upper extremities are normal except for pain in her right thenar eminence.  Plain films and CT pending to evaluate for further injury.  Patient given Tylenol.  She otherwise has normal mental status and no other acute findings.  7:26 PM Imaging is negative for acute findings.  Patient discharged home. Final Clinical Impression(s) / ED Diagnoses Final diagnoses:  Fall, initial encounter  Contusion of face, initial encounter    Rx / DC Orders ED Discharge Orders    None       Blanchie Dessert, MD 06/09/20 1927

## 2020-06-09 NOTE — ED Notes (Signed)
Patient verbalized understanding of discharge instructions. Opportunity for questions and answers.  

## 2021-04-01 IMAGING — CT CT MAXILLOFACIAL W/O CM
4 of 8 series · 15 of 47 positions shown, 18 images · non-contrast
Comparison: CT head 11/09/2019

CLINICAL DATA: Fall.  Head injury.

EXAM:
CT HEAD WITHOUT CONTRAST
CT MAXILLOFACIAL WITHOUT CONTRAST
TECHNIQUE: Multidetector CT imaging of the head and maxillofacial structures
were performed using the standard protocol without intravenous
contrast. Multiplanar CT image reconstructions of the maxillofacial
structures were also generated.

[Series 4: st thins · axial · 0.40mm/px · z∈[+1252,+1378]mm · 9 of 226 slices shown, 12 images]
[im 23/226  brain]
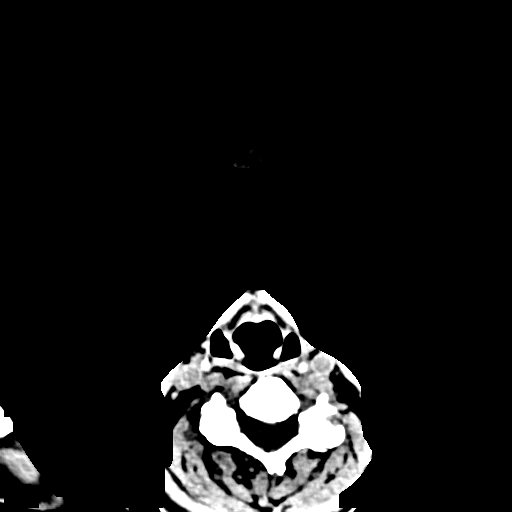
[im 23/226  bone]
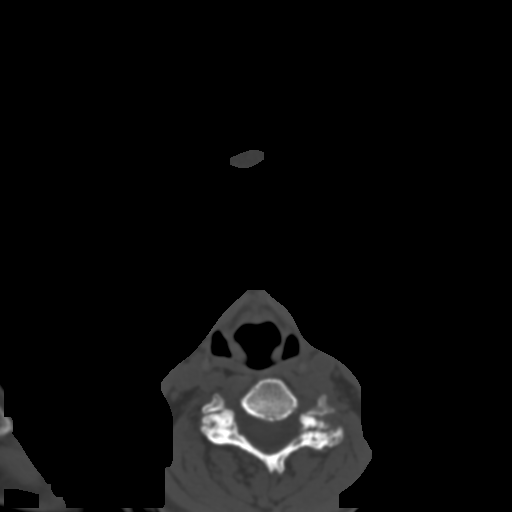
[im 46/226  bone]
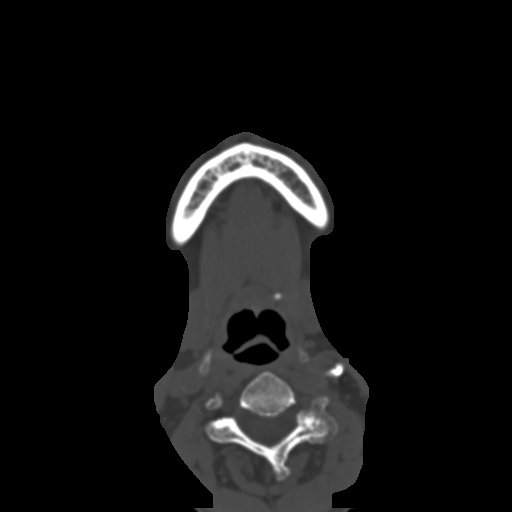
[im 68/226  bone]
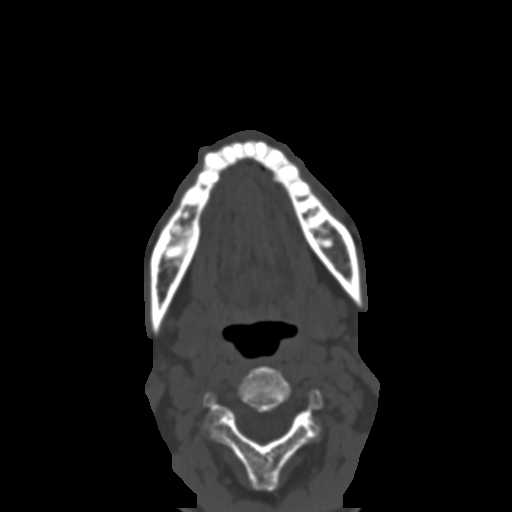
[im 91/226  bone]
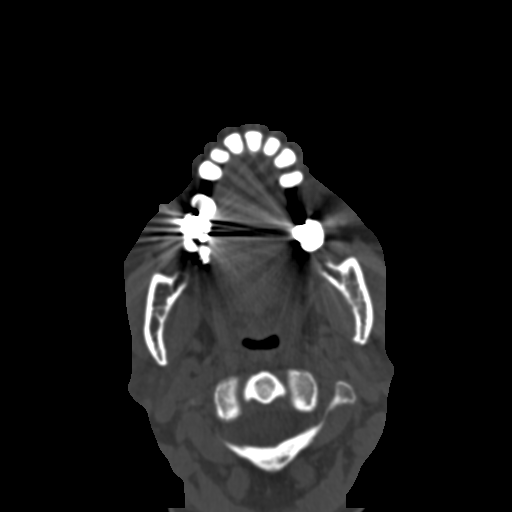
[im 113/226  brain]
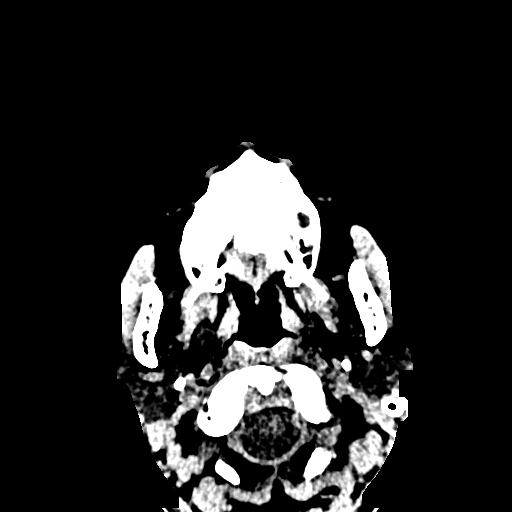
[im 113/226  bone]
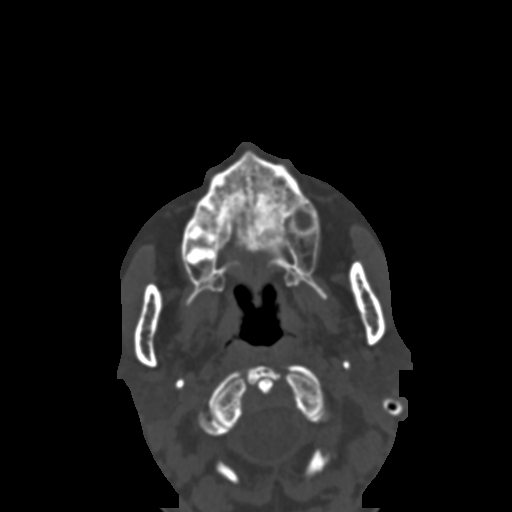
[im 136/226  bone]
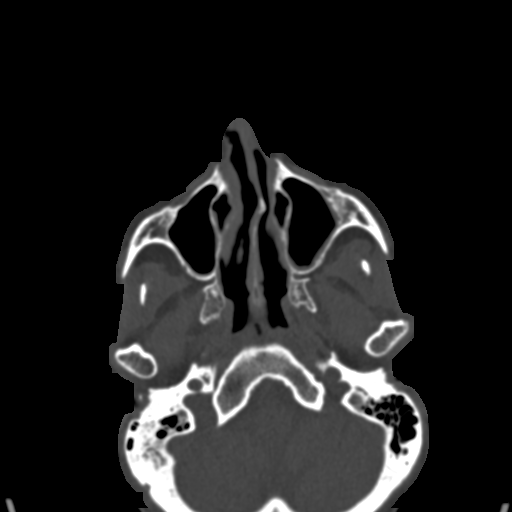
[im 158/226  bone]
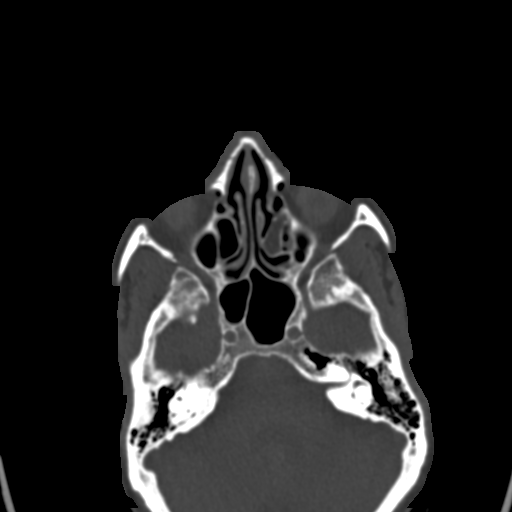
[im 181/226  bone]
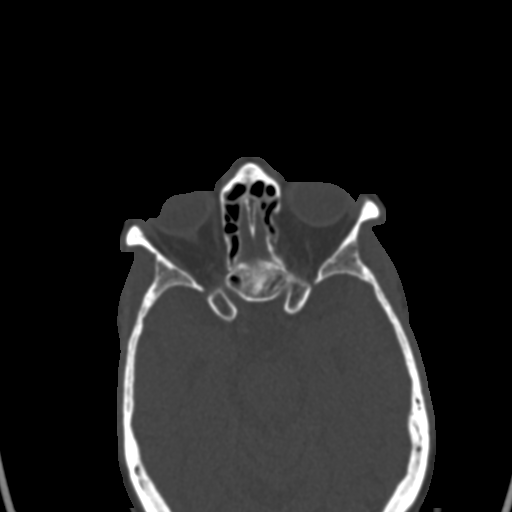
[im 203/226  brain]
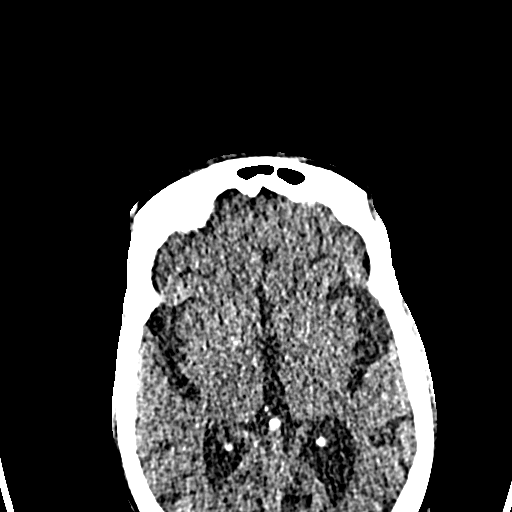
[im 203/226  bone]
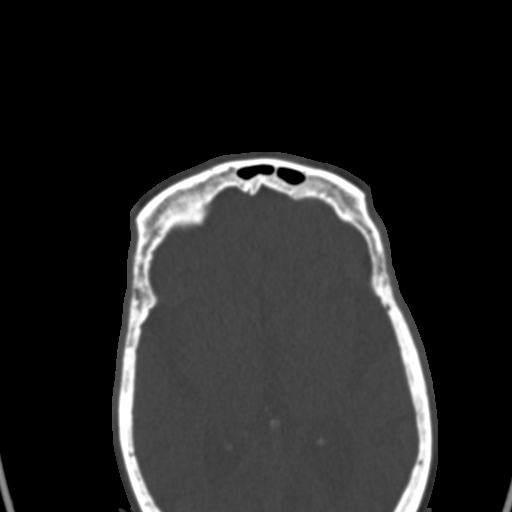

[Series 6: bone thins · axial · 0.40mm/px · 1 of 226 slices shown]
[im 23/226  bone]
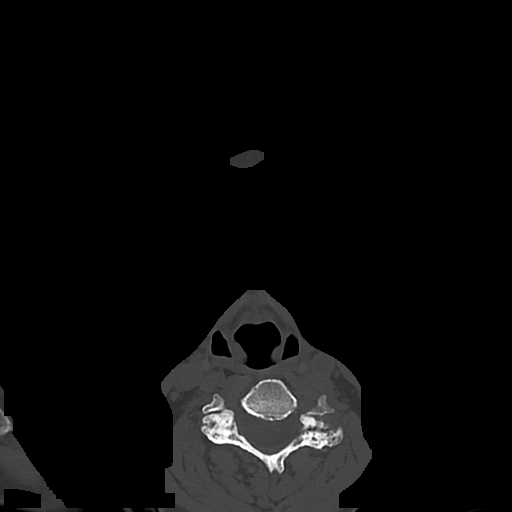

[Series 7: st cor · coronal · 0.31mm/px · 3 of 110 slices shown]
[im 22/110  bone]
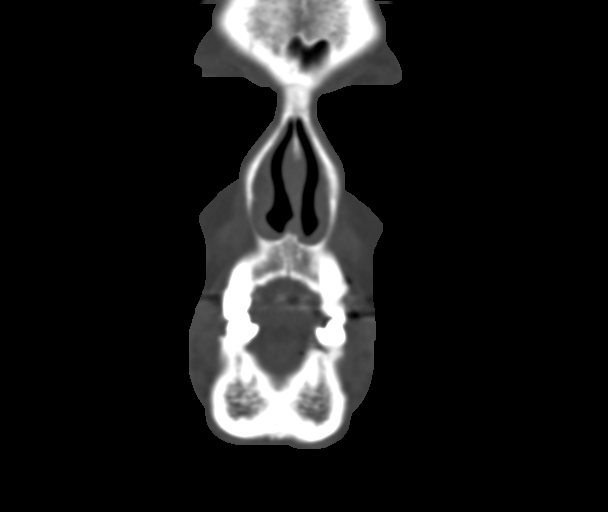
[im 44/110  bone]
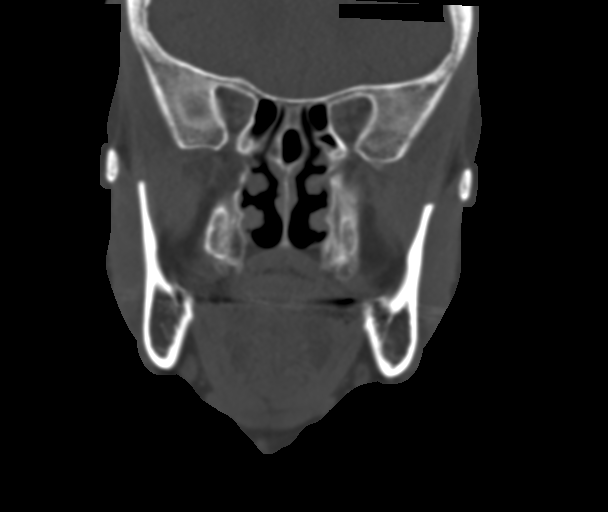
[im 66/110  bone]
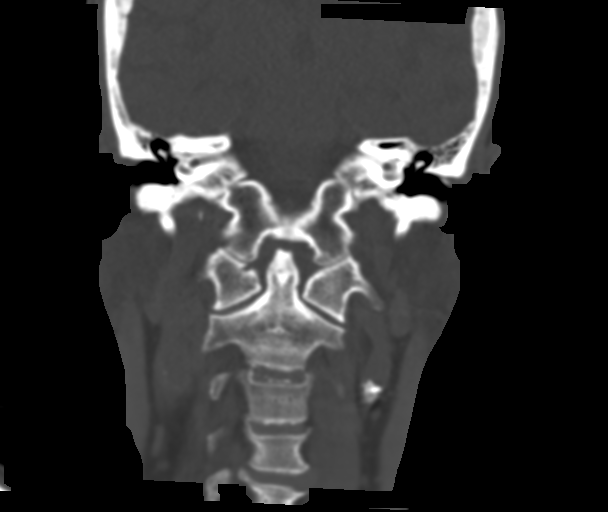

[Series 10: bone sag · sagittal · 0.31mm/px · 2 of 89 slices shown]
[im 30/89  bone]
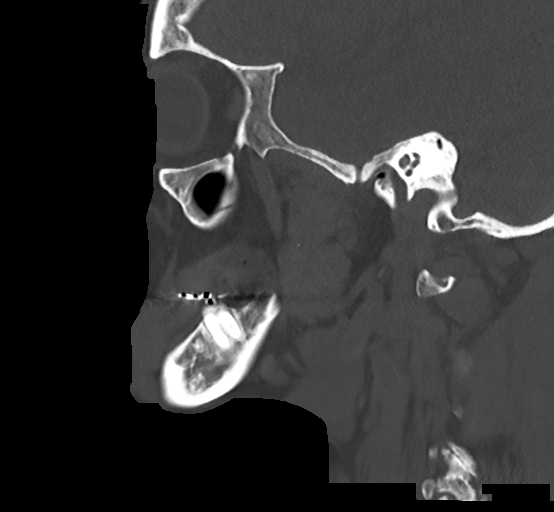
[im 59/89  bone]
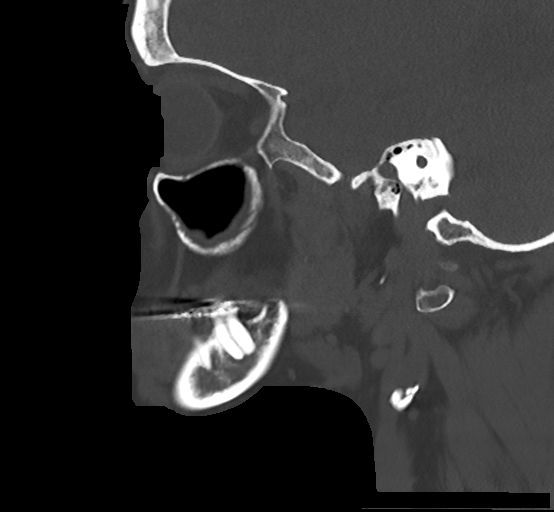

[15 of 47 positions shown; findings below may reference images not displayed]

FINDINGS: CT HEAD FINDINGS

Brain: Mild atrophy.  Mild white matter changes, unchanged.

Negative for acute infarct, hemorrhage, mass.

Vascular: Negative for hyperdense vessel. Atherosclerotic
calcification.

Skull: Negative. Scalp contusion in the anterior frontal region.
Associated laceration.

Other: None

CT MAXILLOFACIAL FINDINGS

Osseous: No acute fracture. Chronic fracture left medial orbit
unchanged from prior studies.

Orbits: Extensive chronic fracture left medial orbit unchanged from
prior studies. Orbital fat and medial rectus protrude into the
fracture defect which is extensive. No acute orbital fracture.
Normal orbital contents.

Sinuses: Mild mucosal edema paranasal sinuses. Negative for
air-fluid level.

Soft tissues: Soft tissue contusion of the forehead
IMPRESSION: No acute intracranial abnormality.

Negative for acute facial fracture.

Extensive chronic fracture left medial orbit.
